# Patient Record
Sex: Male | Born: 1935
Health system: Southern US, Community
[De-identification: ages and names within clinical notes are randomized; demographics above are authoritative.]

## PROBLEM LIST (undated history)

## (undated) DIAGNOSIS — I4949 Other premature depolarization: Secondary | ICD-10-CM

## (undated) DIAGNOSIS — C44201 Unspecified malignant neoplasm of skin of unspecified ear and external auricular canal: Secondary | ICD-10-CM

## (undated) DIAGNOSIS — I5032 Chronic diastolic (congestive) heart failure: Secondary | ICD-10-CM

## (undated) DIAGNOSIS — N200 Calculus of kidney: Secondary | ICD-10-CM

## (undated) DIAGNOSIS — R350 Frequency of micturition: Secondary | ICD-10-CM

## (undated) DIAGNOSIS — K219 Gastro-esophageal reflux disease without esophagitis: Secondary | ICD-10-CM

## (undated) DIAGNOSIS — Z79899 Other long term (current) drug therapy: Secondary | ICD-10-CM

## (undated) DIAGNOSIS — R002 Palpitations: Secondary | ICD-10-CM

## (undated) DIAGNOSIS — I6522 Occlusion and stenosis of left carotid artery: Secondary | ICD-10-CM

## (undated) DIAGNOSIS — R42 Dizziness and giddiness: Secondary | ICD-10-CM

## (undated) DIAGNOSIS — Z8719 Personal history of other diseases of the digestive system: Secondary | ICD-10-CM

## (undated) DIAGNOSIS — N401 Enlarged prostate with lower urinary tract symptoms: Secondary | ICD-10-CM

## (undated) DIAGNOSIS — I219 Acute myocardial infarction, unspecified: Secondary | ICD-10-CM

## (undated) DIAGNOSIS — E669 Obesity, unspecified: Secondary | ICD-10-CM

## (undated) DIAGNOSIS — N210 Calculus in bladder: Secondary | ICD-10-CM

## (undated) DIAGNOSIS — Z125 Encounter for screening for malignant neoplasm of prostate: Secondary | ICD-10-CM

## (undated) DIAGNOSIS — R7303 Prediabetes: Secondary | ICD-10-CM

## (undated) DIAGNOSIS — D649 Anemia, unspecified: Secondary | ICD-10-CM

## (undated) DIAGNOSIS — I10 Essential (primary) hypertension: Secondary | ICD-10-CM

## (undated) DIAGNOSIS — M542 Cervicalgia: Secondary | ICD-10-CM

## (undated) DIAGNOSIS — K648 Other hemorrhoids: Secondary | ICD-10-CM

## (undated) DIAGNOSIS — I2510 Atherosclerotic heart disease of native coronary artery without angina pectoris: Secondary | ICD-10-CM

## (undated) DIAGNOSIS — K209 Esophagitis, unspecified without bleeding: Secondary | ICD-10-CM

## (undated) DIAGNOSIS — L0390 Cellulitis, unspecified: Secondary | ICD-10-CM

## (undated) DIAGNOSIS — J439 Emphysema, unspecified: Secondary | ICD-10-CM

## (undated) DIAGNOSIS — L0291 Cutaneous abscess, unspecified: Secondary | ICD-10-CM

## (undated) DIAGNOSIS — L711 Rhinophyma: Secondary | ICD-10-CM

## (undated) DIAGNOSIS — E785 Hyperlipidemia, unspecified: Secondary | ICD-10-CM

## (undated) DIAGNOSIS — R39198 Other difficulties with micturition: Secondary | ICD-10-CM

## (undated) DIAGNOSIS — R0602 Shortness of breath: Secondary | ICD-10-CM

## (undated) DIAGNOSIS — Z87442 Personal history of urinary calculi: Secondary | ICD-10-CM

## (undated) DIAGNOSIS — I255 Ischemic cardiomyopathy: Secondary | ICD-10-CM

## (undated) DIAGNOSIS — E559 Vitamin D deficiency, unspecified: Secondary | ICD-10-CM

## (undated) DIAGNOSIS — M1990 Unspecified osteoarthritis, unspecified site: Secondary | ICD-10-CM

## (undated) DIAGNOSIS — N201 Calculus of ureter: Secondary | ICD-10-CM

## (undated) DIAGNOSIS — R600 Localized edema: Secondary | ICD-10-CM

## (undated) DIAGNOSIS — Z973 Presence of spectacles and contact lenses: Secondary | ICD-10-CM

## (undated) DIAGNOSIS — Z85828 Personal history of other malignant neoplasm of skin: Secondary | ICD-10-CM

## (undated) DIAGNOSIS — G4700 Insomnia, unspecified: Secondary | ICD-10-CM

## (undated) DIAGNOSIS — I252 Old myocardial infarction: Secondary | ICD-10-CM

## (undated) DIAGNOSIS — N138 Other obstructive and reflux uropathy: Secondary | ICD-10-CM

## (undated) DIAGNOSIS — I739 Peripheral vascular disease, unspecified: Secondary | ICD-10-CM

## (undated) DIAGNOSIS — M950 Acquired deformity of nose: Secondary | ICD-10-CM

## (undated) DIAGNOSIS — C4440 Unspecified malignant neoplasm of skin of scalp and neck: Secondary | ICD-10-CM

## (undated) DIAGNOSIS — T8859XA Other complications of anesthesia, initial encounter: Secondary | ICD-10-CM

## (undated) DIAGNOSIS — I509 Heart failure, unspecified: Secondary | ICD-10-CM

## (undated) DIAGNOSIS — F419 Anxiety disorder, unspecified: Secondary | ICD-10-CM

## (undated) HISTORY — DX: Essential (primary) hypertension: I10

## (undated) HISTORY — DX: Palpitations: R00.2

## (undated) HISTORY — DX: Other premature depolarization: I49.49

## (undated) HISTORY — DX: Heart failure, unspecified: I50.9

## (undated) HISTORY — DX: Rhinophyma: L71.1

## (undated) HISTORY — DX: Dizziness and giddiness: R42

## (undated) HISTORY — DX: Obesity, unspecified: E66.9

## (undated) HISTORY — PX: CARDIAC CATHETERIZATION: SHX172

## (undated) HISTORY — PX: OTHER SURGICAL HISTORY: SHX169

## (undated) HISTORY — DX: Esophagitis, unspecified: K20.9

## (undated) HISTORY — DX: Calculus of kidney: N20.0

## (undated) HISTORY — DX: Other hemorrhoids: K64.8

## (undated) HISTORY — DX: Shortness of breath: R06.02

## (undated) HISTORY — DX: Encounter for screening for malignant neoplasm of prostate: Z12.5

## (undated) HISTORY — DX: Other obstructive and reflux uropathy: N13.8

## (undated) HISTORY — DX: Atherosclerotic heart disease of native coronary artery without angina pectoris: I25.10

## (undated) HISTORY — DX: Insomnia, unspecified: G47.00

## (undated) HISTORY — DX: Unspecified malignant neoplasm of skin of unspecified ear and external auricular canal: C44.201

## (undated) HISTORY — DX: Cutaneous abscess, unspecified: L02.91

## (undated) HISTORY — DX: Peripheral vascular disease, unspecified: I73.9

## (undated) HISTORY — DX: Hyperlipidemia, unspecified: E78.5

## (undated) HISTORY — DX: Benign prostatic hyperplasia with lower urinary tract symptoms: N40.1

## (undated) HISTORY — DX: Unspecified malignant neoplasm of skin of scalp and neck: C44.40

## (undated) HISTORY — DX: Acquired deformity of nose: M95.0

## (undated) HISTORY — DX: Cellulitis, unspecified: L03.90

## (undated) HISTORY — DX: Esophagitis, unspecified without bleeding: K20.90

## (undated) HISTORY — DX: Vitamin D deficiency, unspecified: E55.9

## (undated) HISTORY — DX: Cervicalgia: M54.2

## (undated) HISTORY — DX: Chronic diastolic (congestive) heart failure: I50.32

## (undated) HISTORY — DX: Other difficulties with micturition: R39.198

## (undated) HISTORY — PX: CORONARY ARTERY BYPASS GRAFT: SHX141

## (undated) HISTORY — DX: Frequency of micturition: R35.0

## (undated) HISTORY — DX: Other long term (current) drug therapy: Z79.899

## (undated) HISTORY — DX: Old myocardial infarction: I25.2

---
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
## 1979-05-04 HISTORY — PX: EXCISIONAL HEMORRHOIDECTOMY: SHX1541

## 1998-05-03 DIAGNOSIS — I2510 Atherosclerotic heart disease of native coronary artery without angina pectoris: Secondary | ICD-10-CM

## 1998-05-03 HISTORY — PX: OTHER SURGICAL HISTORY: SHX169

## 1998-05-03 HISTORY — PX: CORONARY ARTERY BYPASS GRAFT: SHX141

## 1998-05-03 HISTORY — PX: LEFT HEART CATH AND CORONARY ANGIOGRAPHY: CATH118249

## 1998-05-03 HISTORY — DX: Atherosclerotic heart disease of native coronary artery without angina pectoris: I25.10

## 1998-09-11 ENCOUNTER — Inpatient Hospital Stay (HOSPITAL_COMMUNITY): Admission: EM | Admit: 1998-09-11 | Discharge: 1998-09-20 | Payer: Self-pay | Admitting: Emergency Medicine

## 1998-09-11 HISTORY — PX: CORONARY ARTERY BYPASS GRAFT: SHX141

## 1998-09-12 ENCOUNTER — Encounter: Payer: Self-pay | Admitting: Cardiovascular Disease

## 1998-09-14 ENCOUNTER — Encounter: Payer: Self-pay | Admitting: Cardiovascular Disease

## 1998-09-15 ENCOUNTER — Encounter: Payer: Self-pay | Admitting: Thoracic Surgery (Cardiothoracic Vascular Surgery)

## 1998-09-16 ENCOUNTER — Encounter: Payer: Self-pay | Admitting: Thoracic Surgery (Cardiothoracic Vascular Surgery)

## 1998-09-17 ENCOUNTER — Encounter: Payer: Self-pay | Admitting: Thoracic Surgery (Cardiothoracic Vascular Surgery)

## 1998-09-18 ENCOUNTER — Encounter: Payer: Self-pay | Admitting: Thoracic Surgery (Cardiothoracic Vascular Surgery)

## 1998-10-28 ENCOUNTER — Encounter (HOSPITAL_COMMUNITY): Admission: RE | Admit: 1998-10-28 | Discharge: 1998-12-01 | Payer: Self-pay | Admitting: Cardiovascular Disease

## 1998-12-02 ENCOUNTER — Encounter (HOSPITAL_COMMUNITY): Admission: RE | Admit: 1998-12-02 | Discharge: 1999-03-02 | Payer: Self-pay | Admitting: Cardiovascular Disease

## 1999-02-09 ENCOUNTER — Encounter (HOSPITAL_COMMUNITY): Admission: RE | Admit: 1999-02-09 | Discharge: 1999-05-10 | Payer: Self-pay | Admitting: Cardiovascular Disease

## 1999-04-03 ENCOUNTER — Encounter (HOSPITAL_COMMUNITY): Admission: RE | Admit: 1999-04-03 | Discharge: 1999-07-02 | Payer: Self-pay | Admitting: Cardiovascular Disease

## 1999-07-03 ENCOUNTER — Encounter (HOSPITAL_COMMUNITY): Admission: RE | Admit: 1999-07-03 | Discharge: 1999-10-01 | Payer: Self-pay | Admitting: Cardiovascular Disease

## 1999-10-02 ENCOUNTER — Encounter (HOSPITAL_COMMUNITY): Admission: RE | Admit: 1999-10-02 | Discharge: 1999-12-31 | Payer: Self-pay | Admitting: Cardiovascular Disease

## 2000-01-01 ENCOUNTER — Encounter (HOSPITAL_COMMUNITY): Admission: RE | Admit: 2000-01-01 | Discharge: 2000-03-31 | Payer: Self-pay | Admitting: Cardiovascular Disease

## 2000-04-01 ENCOUNTER — Encounter (HOSPITAL_COMMUNITY): Admission: RE | Admit: 2000-04-01 | Discharge: 2000-06-30 | Payer: Self-pay | Admitting: Cardiovascular Disease

## 2003-05-04 HISTORY — PX: LITHOTRIPSY: SUR834

## 2003-05-04 HISTORY — PX: EXTRACORPOREAL SHOCK WAVE LITHOTRIPSY: SHX1557

## 2003-12-18 ENCOUNTER — Emergency Department (HOSPITAL_COMMUNITY): Admission: EM | Admit: 2003-12-18 | Discharge: 2003-12-19 | Payer: Self-pay

## 2003-12-26 ENCOUNTER — Ambulatory Visit (HOSPITAL_COMMUNITY): Admission: RE | Admit: 2003-12-26 | Discharge: 2003-12-26 | Payer: Self-pay | Admitting: Urology

## 2005-05-03 HISTORY — PX: COLONOSCOPY: SHX5424

## 2005-05-03 HISTORY — PX: ESOPHAGOGASTRODUODENOSCOPY ENDOSCOPY: SHX5814

## 2005-05-03 HISTORY — PX: COLONOSCOPY WITH ESOPHAGOGASTRODUODENOSCOPY (EGD): SHX5779

## 2005-05-03 LAB — HM COLONOSCOPY: HM Colonoscopy: NORMAL

## 2005-05-10 ENCOUNTER — Encounter: Payer: Self-pay | Admitting: Gastroenterology

## 2005-05-10 ENCOUNTER — Ambulatory Visit (HOSPITAL_COMMUNITY): Admission: RE | Admit: 2005-05-10 | Discharge: 2005-05-10 | Payer: Self-pay | Admitting: *Deleted

## 2009-01-31 ENCOUNTER — Encounter (INDEPENDENT_AMBULATORY_CARE_PROVIDER_SITE_OTHER): Payer: Self-pay

## 2009-12-03 ENCOUNTER — Encounter (INDEPENDENT_AMBULATORY_CARE_PROVIDER_SITE_OTHER): Payer: Self-pay | Admitting: *Deleted

## 2010-01-16 ENCOUNTER — Ambulatory Visit: Payer: Self-pay | Admitting: Gastroenterology

## 2010-06-02 NOTE — Procedures (Signed)
Summary: Colonoscopy / [facility]  Colonoscopy / [facility]   Imported By: Lennie Odor 01/21/2010 11:33:37  _____________________________________________________________________  External Attachment:    Type:   Image     Comment:   External Document

## 2010-06-02 NOTE — Procedures (Signed)
Summary: Endoscopy / [facility]  Endoscopy / [facility]   Imported By: Lennie Odor 01/21/2010 11:32:25  _____________________________________________________________________  External Attachment:    Type:   Image     Comment:   External Document

## 2010-06-02 NOTE — Assessment & Plan Note (Signed)
History of Present Illness Visit Type: consult  Primary GI MD: Rob Bunting MD Primary Provider: Kimber Relic, MD  Requesting Provider: na Chief Complaint: GERD, and rectal bleeding  History of Present Illness:     very pleasant 77 year old man who had been seeing Dr. Virginia Rochester from GI.  He had colonoscopies in the past, last one was in 2007.  He understands that colon was normal.  No polyps.  He also underwent EGD at same time and describes "raw spots" on the esophagus and was put on PPI.  He has had pyrosis, which is under good control as long as he takes PPI.  If he eats a late night meal, or something unusual he will have pyrosis, acid regurg. He will sit up in bed, drinks some water.  No dypshagia.  He has gained about 10 pounds in the past year.  No new bowel changes since colonoscopy, does sees dripping red blood from rectum.  He can feel some hemorrhoidal bumps.            Current Medications (verified): 1)  Diazepam 10 Mg Tabs (Diazepam) .... 2 1/2 Tablet By Mouth At Bedtime 2)  Amlodipine Besylate 5 Mg Tabs (Amlodipine Besylate) .... One Tablet By Mouth Once Daily 3)  Advair Diskus 250-50 Mcg/dose Aepb (Fluticasone-Salmeterol) .... As Directed 4)  Doxepin Hcl 25 Mg Caps (Doxepin Hcl) .... Two Tablets By Mouth At Bedtime 5)  Lorazepam 0.5 Mg Tabs (Lorazepam) .... One Tablet By Mouth At Bedtime 6)  Vitamin D 400 Unit  Tabs (Cholecalciferol) .... One Tablet By Mouth Once Daily 7)  Fish Oil   Oil (Fish Oil) .... Takes 2400mg  Once Daily By Mouth 8)  Magnesium 500 Mg Tabs (Magnesium) .... One Tablet By Mouth Once Daily 9)  Coq10 400 Mg Caps (Coenzyme Q10) .... One Capsule By Mouth Once Daily 10)  Potassium Gluconate 550 Mg Tabs (Potassium Gluconate) .... One Tablet By Mouth Once Daily 11)  Prilosec 40 Mg Cpdr (Omeprazole) .... One Tablet By Mouth Once Daily 12)  Pantoprazole Sodium 40 Mg Tbec (Pantoprazole Sodium) .... One Tablet By Mouth Once Daily--Will Take When No  Prilosec  Allergies (verified): No Known Drug Allergies  Past History:  Past Medical History: arthritis Coronary artery disease,  Elevated cholesterol Hypertension Kidney stones  Past Surgical History: coronary artery bypass in 2000  Family History: no colon cancer daughter with melanoma  Social History: he is widowed, he has 4 children, he is retired but still does some work in a [employer], he quit smoking, he drinks one alcoholic beverage per day, he drinks 2 caffeinated beverages per day  Review of Systems       Pertinent positive and negative review of systems were noted in the above HPI and GI specific review of systems.  All other review of systems was otherwise negative.   Vital Signs:  Patient profile:   77 year old male Height:      71 inches Weight:      217 pounds BMI:     30.37 BSA:     2.19 Pulse rate:   88 / minute Pulse rhythm:   regular BP sitting:   136 / 80  (left arm) Cuff size:   regular  Vitals Entered By: Ok Anis CMA (January 16, 2010 10:18 AM) CC: GERD, and rectal bleeding    Physical Exam  Additional Exam:  Constitutional: generally well appearing Psychiatric: alert and oriented times 3 Eyes: extraocular movements intact Mouth: oropharynx moist, no  lesions Neck: supple, no lymphadenopathy Cardiovascular: heart regular rate and rythm Lungs: CTA bilaterally Abdomen: soft, non-tender, non-distended, no obvious ascites, no peritoneal signs, normal bowel sounds Extremities: no lower extremity edema bilaterally Skin: no lesions on visible extremities Rectal examination reveals external hemorrhoids, soft, nonthrombosed, no rectal masses, stool was brown and Hemoccult negative   Impression & Recommendations:  Problem # 1:  rectal bleeding, GERD the rectal bleeding is fairly rare and almost certainly from his external hemorrhoids. I advised him to use over-the-counter ointments as needed. His GERD symptoms are under good control as  long as he takes proton pump inhibitor, I did recommend he try H2 blocker at night HEENT late meal. We will get records from Dr. Wende Neighbors colonoscopy, upper endoscopy from 2007 to see if he indeed needs any repeat examinations are round now. I suspect that he will not.  Patient Instructions: 1)  Use preparation H as needed for hemorrhoidal bleeding, discomfort. 2)  Will get records from Dr. Wende Neighbors EGD, colonoscopy to decide on necessity of repeat procedures. 3)  Continue on once daily protonix. 4)  GERD handout given. 5)  OTC pepcid or zantac can be very helpful at bedtime if you eat a late meal. 6)  A copy of this information will be sent to Dr. Frederik Pear. 7)  The medication list was reviewed and reconciled.  All changed / newly prescribed medications were explained.  A complete medication list was provided to the patient / caregiver.  Appended Document:  recieved records from 2007 colo/EGD:  EGD suggested gastritris, ?barrett's (biopsies only GERD, no intestinal metaplastia.  Colonoscopy to cecum described diveritculosis, but no polyps or cancers.   please call him.  he does not need repeat EGD or colonoscopy at this point.  Appended Document:  pt aware

## 2010-06-02 NOTE — Letter (Signed)
Summary: New Patient letter  [facility]  [address] [place], [place] [address]   Phone: [phone]  Fax: [phone]       12/03/2009 MRN: [id number]  [facility] [address] [place], [place]  [address]  Dear [name],  Welcome to the Gastroenterology Division at [facility].    You are scheduled to see Dr.  Christella Hartigan on 01-16-10 at 10:30a.m. on the 3rd floor at [facility], [address].  We ask that you try to arrive at our office 15 minutes prior to your appointment time to allow for check-in.  We would like you to complete the enclosed self-administered evaluation form prior to your visit and bring it with you on the day of your appointment.  We will review it with you.  Also, please bring a complete list of all your medications or, if you prefer, bring the medication bottles and we will list them.  Please bring your insurance card so that we may make a copy of it.  If your insurance requires a referral to see a specialist, please bring your referral form from your primary care physician.  Co-payments are due at the time of your visit and may be paid by cash, check or credit card.     Your office visit will consist of a consult with your physician (includes a physical exam), any laboratory testing he/she may order, scheduling of any necessary diagnostic testing (e.g. x-ray, ultrasound, CT-scan), and scheduling of a procedure (e.g. Endoscopy, Colonoscopy) if required.  Please allow enough time on your schedule to allow for any/all of these possibilities.    If you cannot keep your appointment, please call [phone] to cancel or reschedule prior to your appointment date.  This allows [place] the opportunity to schedule an appointment for another patient in need of care.  If you do not cancel or reschedule by 5 p.m. the business day prior to your appointment date, you will be charged a $50.00 late cancellation/no-show fee.    Thank you for choosing  [facility] for your medical needs.  We appreciate the opportunity to care for you.  Please visit [place] at our website  to learn more about our practice.                     Sincerely,                                                             The Gastroenterology Division

## 2010-06-19 ENCOUNTER — Other Ambulatory Visit: Payer: Self-pay | Admitting: Neurosurgery

## 2010-06-19 DIAGNOSIS — M542 Cervicalgia: Secondary | ICD-10-CM

## 2010-07-06 ENCOUNTER — Ambulatory Visit
Admission: RE | Admit: 2010-07-06 | Discharge: 2010-07-06 | Disposition: A | Discharge status: Home / Self Care | Payer: Medicare Other | Source: Ambulatory Visit | Attending: Neurosurgery | Admitting: Neurosurgery

## 2010-07-06 DIAGNOSIS — M542 Cervicalgia: Secondary | ICD-10-CM

## 2010-10-16 ENCOUNTER — Other Ambulatory Visit: Payer: Self-pay | Admitting: Dermatology

## 2012-08-20 ENCOUNTER — Other Ambulatory Visit: Payer: Self-pay | Admitting: Internal Medicine

## 2012-09-20 ENCOUNTER — Other Ambulatory Visit: Payer: Self-pay | Admitting: *Deleted

## 2012-09-20 MED ORDER — DOXEPIN HCL 25 MG PO CAPS: ORAL_CAPSULE | ORAL | Status: DC

## 2012-10-06 ENCOUNTER — Other Ambulatory Visit: Payer: PRIVATE HEALTH INSURANCE

## 2012-10-06 ENCOUNTER — Encounter: Payer: Self-pay | Admitting: *Deleted

## 2012-10-06 ENCOUNTER — Other Ambulatory Visit: Payer: Self-pay | Admitting: *Deleted

## 2012-10-06 DIAGNOSIS — Z79899 Other long term (current) drug therapy: Secondary | ICD-10-CM

## 2012-10-06 DIAGNOSIS — I10 Essential (primary) hypertension: Secondary | ICD-10-CM

## 2012-10-06 DIAGNOSIS — E785 Hyperlipidemia, unspecified: Secondary | ICD-10-CM

## 2012-10-07 LAB — LIPID PANEL
Chol/HDL Ratio: 3.7 ratio units (ref 0.0–5.0)
Cholesterol, Total: 175 mg/dL (ref 100–199)
Triglycerides: 81 mg/dL (ref 0–149)
VLDL Cholesterol Cal: 16 mg/dL (ref 5–40)

## 2012-10-07 LAB — BASIC METABOLIC PANEL
BUN: 12 mg/dL (ref 8–27)
Calcium: 8.8 mg/dL (ref 8.6–10.2)
Chloride: 104 mmol/L (ref 97–108)
GFR calc non Af Amer: 74 mL/min/{1.73_m2} (ref >59)
Glucose: 93 mg/dL (ref 65–99)
Potassium: 4 mmol/L (ref 3.5–5.2)

## 2012-10-07 LAB — TSH: TSH: 1.39 u[IU]/mL (ref 0.450–4.500)

## 2012-10-09 ENCOUNTER — Encounter: Payer: Self-pay | Admitting: Geriatric Medicine

## 2012-10-10 ENCOUNTER — Encounter: Payer: Self-pay | Admitting: Internal Medicine

## 2012-10-10 ENCOUNTER — Ambulatory Visit (INDEPENDENT_AMBULATORY_CARE_PROVIDER_SITE_OTHER): Payer: 59 | Admitting: Internal Medicine

## 2012-10-10 VITALS — BP 124/62 | HR 80 | Temp 97.5°F | Resp 18 | Ht 69.5 in | Wt 198.6 lb

## 2012-10-10 DIAGNOSIS — E785 Hyperlipidemia, unspecified: Secondary | ICD-10-CM | POA: Insufficient documentation

## 2012-10-10 DIAGNOSIS — E559 Vitamin D deficiency, unspecified: Secondary | ICD-10-CM | POA: Insufficient documentation

## 2012-10-10 DIAGNOSIS — I2510 Atherosclerotic heart disease of native coronary artery without angina pectoris: Secondary | ICD-10-CM

## 2012-10-10 DIAGNOSIS — I10 Essential (primary) hypertension: Secondary | ICD-10-CM | POA: Insufficient documentation

## 2012-10-10 DIAGNOSIS — N138 Other obstructive and reflux uropathy: Secondary | ICD-10-CM | POA: Insufficient documentation

## 2012-10-10 DIAGNOSIS — G4700 Insomnia, unspecified: Secondary | ICD-10-CM

## 2012-10-10 DIAGNOSIS — N401 Enlarged prostate with lower urinary tract symptoms: Secondary | ICD-10-CM | POA: Insufficient documentation

## 2012-10-10 DIAGNOSIS — R42 Dizziness and giddiness: Secondary | ICD-10-CM | POA: Insufficient documentation

## 2012-10-10 DIAGNOSIS — E669 Obesity, unspecified: Secondary | ICD-10-CM | POA: Insufficient documentation

## 2012-10-10 NOTE — Progress Notes (Signed)
Subjective:    Patient ID: Alex [name], male    DOB: December 27, 1931, 79 y.o.   MRN: [id number]  HPI Other and unspecified hyperlipidemia  controlled Unspecified essential hypertension  controlled Coronary atherosclerosis of unspecified type of vessel, native or graft  No angina Dizziness and giddiness  improved Obesity, unspecified  uunchanged Insomnia, unspecified  unchanged Hypertrophy of prostate with urinary obstruction and other lower urinary tract symptoms (LUTS)  Unchanged. Denies frequency or nocturia. Unspecified vitamin D deficiency  Using supplement    Current Outpatient Prescriptions on File Prior to Visit  Medication Sig Dispense Refill  . amLODipine (NORVASC) 5 MG tablet take 1 tablet by mouth once daily to CONTROL blood pressure  30 tablet  4  . aspirin 81 MG tablet Take 81 mg by mouth daily. Take one tablet once a day      . cholecalciferol (VITAMIN D) 1000 UNITS tablet Take 1,000 Units by mouth daily. Take two tablets daily      . diazepam (VALIUM) 5 MG tablet Take 5 mg by mouth every 6 (six) hours as needed for anxiety. Take 1/2 tablet at night for dizziness as needed      . doxepin (SINEQUAN) 25 MG capsule Take 3 capsules once a day at bedtime for depression  90 capsule  5  . fish oil-omega-3 fatty acids 1000 MG capsule Take 2 g by mouth daily. Take one tablet once a day for cholesterol      . ibuprofen (ADVIL,MOTRIN) 200 MG tablet Take 200 mg by mouth every 6 (six) hours as needed for pain. Take 3-4 tablets twice a day for neck pain as needed      . LORazepam (ATIVAN) 1 MG tablet Take 1 mg by mouth every 8 (eight) hours. Take one tablet at bedtime as needed for rest      . pantoprazole (PROTONIX) 40 MG tablet Take 40 mg by mouth daily. Take one tablet once a day for acid reflux      . Potassium 99 MG TABS Take by mouth. Take one tablet once a day for cramps      . pseudoephedrine-guaifenesin (MUCINEX D) 60-600 MG per tablet Take 1 tablet by mouth every 12 (twelve)  hours. Take 1 tablet once a day as needed for cough      . Red Yeast Rice Extract 600 MG TABS Take by mouth. Take 2 tablets once a day for cholesterol      . RESVERATROL 100 MG CAPS Take by mouth. Take one tablet once a day      . Fluticasone-Salmeterol (ADVAIR) 250-50 MCG/DOSE AEPB Inhale 1 puff into the lungs every 12 (twelve) hours. Inhale twice a day       No current facility-administered medications on file prior to visit.     Review of Systems  Constitutional: Negative for fever, activity change, appetite change, fatigue and unexpected weight change.  HENT: Positive for hearing loss. Negative for neck pain, neck stiffness and ear discharge.   Eyes: Negative.   Respiratory: Negative.   Cardiovascular: Negative.   Gastrointestinal:       Occasional heartburn which seems well controlled. History of hemorrhoids.  Endocrine: Negative.   Genitourinary: Negative.        History of kidney stones  Musculoskeletal: Positive for back pain and arthralgias.  Skin: Negative.   Neurological:       Episodes of dizziness. None recent. Had an episode of syncope after severe coughing.  Hematological: Negative.   Psychiatric/Behavioral: Negative.  Mild insomnia.       Objective: Filed Vitals:   10/10/12 1327  BP: 124/62  Pulse: 80  Temp: 97.5 F (36.4 C)  Resp: 18  Height: 5' 9.5" (1.765 m)  Weight: 198 lb 9.6 oz (90.084 kg)        Physical Exam  Constitutional: He is oriented to person, place, and time.  Overweight.  HENT:  Mild loss of hearing bilaterally.  Eyes:  Corrective lenses.  Abdominal: Soft. Bowel sounds are normal. He exhibits no distension and no mass. There is no tenderness.  Musculoskeletal: Normal range of motion. He exhibits no edema and no tenderness.  Neurological: He is alert and oriented to person, place, and time. He has normal reflexes. No cranial nerve deficit. Coordination normal.  Skin: Skin is warm and dry. No rash noted. No erythema. No  pallor.  Psychiatric: He has a normal mood and affect. His behavior is normal. Judgment and thought content normal.      Appointment on 10/06/2012  Component Date Value Range Status  . Glucose 10/06/2012 93  65 - 99 mg/dL Final  . BUN 95/62/1308 12  8 - 27 mg/dL Final  . Creatinine, Ser 10/06/2012 0.97  0.76 - 1.27 mg/dL Final  . GFR calc non Af Amer 10/06/2012 74  >59 mL/min/1.73 Final  . GFR calc Af Amer 10/06/2012 85  >59 mL/min/1.73 Final  . BUN/Creatinine Ratio 10/06/2012 12  10 - 22 Final  . Sodium 10/06/2012 141  134 - 144 mmol/L Final  . Potassium 10/06/2012 4.0  3.5 - 5.2 mmol/L Final  . Chloride 10/06/2012 104  97 - 108 mmol/L Final  . CO2 10/06/2012 23  19 - 28 mmol/L Final   Comment: **Effective October 16, 2012, the reference interval for**                            Carbon Dioxide, Total will be changing to:                                                    0 - 30 days        15 - 27                                                 31 d - 5 months       15 - 26                                                  6 m - 11 months      15 - 25                                                    1 - 12 years       17 - 27                                                      >  12 years       18 - 29  . Calcium 10/06/2012 8.8  8.6 - 10.2 mg/dL Final  . Cholesterol, Total 10/06/2012 175  100 - 199 mg/dL Final  . Triglycerides 10/06/2012 81  0 - 149 mg/dL Final  . HDL 21/30/8657 47  >39 mg/dL Final   Comment: According to ATP-III Guidelines, HDL-C >59 mg/dL is considered a                          negative risk factor for CHD.  Marland Kitchen VLDL Cholesterol Cal 10/06/2012 16  5 - 40 mg/dL Final  . LDL Calculated 10/06/2012 846* 0 - 99 mg/dL Final  . Chol/HDL Ratio 10/06/2012 3.7  0.0 - 5.0 ratio units Final  . TSH 10/06/2012 1.390  0.450 - 4.500 uIU/mL Final       Assessment & Plan:  Other and unspecified hyperlipidemia: Controlled  Unspecified essential hypertension:  Controlled  Coronary atherosclerosis of unspecified type of vessel, native or graft: No angina or palpitations  Dizziness and giddiness: Chronic condition without recent worsening  Obesity, unspecified: Encourage weight loss  Insomnia, unspecified: Stable  Hypertrophy of prostate with urinary obstruction and other lower urinary tract symptoms (LUTS): Stable  Unspecified vitamin D deficiency: Using supplements

## 2012-10-10 NOTE — Patient Instructions (Addendum)
Continue current medications. 

## 2012-10-16 ENCOUNTER — Other Ambulatory Visit: Payer: Self-pay | Admitting: Internal Medicine

## 2012-10-19 ENCOUNTER — Other Ambulatory Visit: Payer: Self-pay | Admitting: Geriatric Medicine

## 2012-10-19 MED ORDER — LORAZEPAM 1 MG PO TABS: ORAL_TABLET | ORAL | Status: DC

## 2012-11-09 ENCOUNTER — Ambulatory Visit (INDEPENDENT_AMBULATORY_CARE_PROVIDER_SITE_OTHER): Payer: Medicare Other | Admitting: Cardiovascular Disease

## 2012-11-09 ENCOUNTER — Encounter: Payer: Self-pay | Admitting: Cardiovascular Disease

## 2012-11-09 VITALS — BP 144/86 | HR 91 | Ht 69.0 in | Wt 203.1 lb

## 2012-11-09 DIAGNOSIS — R0989 Other specified symptoms and signs involving the circulatory and respiratory systems: Secondary | ICD-10-CM

## 2012-11-09 DIAGNOSIS — E785 Hyperlipidemia, unspecified: Secondary | ICD-10-CM

## 2012-11-09 DIAGNOSIS — I10 Essential (primary) hypertension: Secondary | ICD-10-CM

## 2012-11-09 DIAGNOSIS — I779 Disorder of arteries and arterioles, unspecified: Secondary | ICD-10-CM

## 2012-11-09 DIAGNOSIS — I2510 Atherosclerotic heart disease of native coronary artery without angina pectoris: Secondary | ICD-10-CM

## 2012-11-09 NOTE — Assessment & Plan Note (Signed)
Patient had carotid Dopplers performed at [facility] medical which showed moderate left ICA stenosis. He is neurologically asymptomatic. Based on the velocities I don't think he requires revascularization at this time. I'm going to repeat this as a baseline your office and continued surveillance Dopplers on him every 6 months. He may ultimately require surgical versus percutaneous revascularization.

## 2012-11-09 NOTE — Patient Instructions (Signed)
Your physician has requested that you have a carotid duplex. This test is an ultrasound of the carotid arteries in your neck. It looks at blood flow through these arteries that supply the brain with blood. Allow one hour for this exam. There are no restrictions or special instructions.  Your physician recommends that you schedule a follow-up appointment in: 1 year

## 2012-11-09 NOTE — Progress Notes (Signed)
11/09/2012 Alex [name]   1931/11/02  [id number]  Primary Physician GREEN, Alex Curt, MD Primary Cardiologist: Alex Gess MD Alex [name]  HPI:  [name] is a delightful 79 year old widowed Caucasian male father of 4, grandfather to 5 grandchildren he was referred by [name] for evaluation of carotid artery disease.he has a history of coronary artery bypass grafting x4 in 2000 performed by Dr. Edwina [name]. His other problems include remote tobacco abuse having quit in 1975, treated hypertension and hyperlipidemia. He's not had a stroke or a myocardial infarction. He had carotid Dopplers performed at [facility] clinic on 10/23/2012 that showed moderate left ICA stenosis or diastolic velocity of 74 cm/s.   Current Outpatient Prescriptions  Medication Sig Dispense Refill  . amLODipine (NORVASC) 5 MG tablet take 1 tablet by mouth once daily to CONTROL blood pressure  30 tablet  4  . aspirin 81 MG tablet Take 81 mg by mouth daily. Take one tablet once a day      . b complex vitamins tablet Take 1 tablet by mouth daily.      . cholecalciferol (VITAMIN D) 1000 UNITS tablet Take 1,000 Units by mouth daily. Take two tablets daily      . Cinnamon 500 MG TABS Take 1 tablet by mouth daily.      . Coconut Oil 1000 MG CAPS Take 1 capsule by mouth daily.      . Coenzyme Q10 (CO Q 10) 100 MG CAPS Take 1 capsule by mouth daily.      . diazepam (VALIUM) 5 MG tablet Take 5 mg by mouth every 6 (six) hours as needed for anxiety. Take 1/2 tablet at night for dizziness as needed      . doxepin (SINEQUAN) 25 MG capsule Take 3 capsules once a day at bedtime for depression  90 capsule  5  . fish oil-omega-3 fatty acids 1000 MG capsule Take 2 g by mouth daily. Take one tablet once a day for cholesterol      . Fluticasone-Salmeterol (ADVAIR) 250-50 MCG/DOSE AEPB Inhale 1 puff into the lungs every 12 (twelve) hours. Inhale twice a day      . Garcinia Cambogia-Chromium 500-200  MG-MCG TABS Take 1 tablet by mouth daily.      Marland Kitchen ibuprofen (ADVIL,MOTRIN) 200 MG tablet Take 200 mg by mouth every 6 (six) hours as needed for pain. Take 3-4 tablets twice a day for neck pain as needed      . LORazepam (ATIVAN) 1 MG tablet One tablet by mouth nightly if needed for rest  30 tablet  0  . Menaquinone-7 (VITAMIN K2 PO) Take 50 mcg by mouth daily.      . Nutritional Supplements (NUTRITIONAL SUPPLEMENT PO) Synerflex. Take 2 tablets by mouth daily.      . Nutritional Supplements (NUTRITIONAL SUPPLEMENT PO) Ginger and Turmeric. Take 1 capsule by mouth daily.      . Potassium 99 MG TABS Take by mouth. Take one tablet once a day for cramps      . Red Yeast Rice Extract 600 MG TABS Take by mouth. Take 2 tablets once a day for cholesterol      . RESVERATROL 100 MG CAPS Take by mouth. Take one tablet once a day      . ZOSTAVAX 40981 UNT/0.65ML injection        No current facility-administered medications for this visit.    No Known Allergies  History   Social History  .  Marital Status: Widowed    Spouse Name: N/A    Number of Children: N/A  . Years of Education: N/A   Occupational History  . Not on file.   Social History Main Topics  . Smoking status: Former Smoker -- 1.50 packs/day for 22 years    Types: Cigarettes    Quit date: 05/03/1973  . Smokeless tobacco: Never Used  . Alcohol Use: Yes  . Drug Use: No  . Sexually Active: No   Other Topics Concern  . Not on file   Social History Narrative  . No narrative on file     Review of Systems: General: negative for chills, fever, night sweats or weight changes.  Cardiovascular: negative for chest pain, dyspnea on exertion, edema, orthopnea, palpitations, paroxysmal nocturnal dyspnea or shortness of breath Dermatological: negative for rash Respiratory: negative for cough or wheezing Urologic: negative for hematuria Abdominal: negative for nausea, vomiting, diarrhea, bright red blood per rectum, melena, or  hematemesis Neurologic: negative for visual changes, syncope, or dizziness All other systems reviewed and are otherwise negative except as noted above.    Blood pressure 144/86, pulse 91, height 5\' 9"  (1.753 m), weight 203 lb 1.6 oz (92.126 kg).  General appearance: alert and no distress Neck: no adenopathy, no carotid bruit, no JVD, supple, symmetrical, trachea midline and thyroid not enlarged, symmetric, no tenderness/mass/nodules Lungs: clear to auscultation bilaterally Heart: regular rate and rhythm, S1, S2 normal, no murmur, click, rub or gallop Abdomen: soft, non-tender; bowel sounds normal; no masses,  no organomegaly Extremities: extremities normal, atraumatic, no cyanosis or edema Pulses: 2+ and symmetric  EKG normal sinus rhythm at 87 without ST or T wave changes  ASSESSMENT AND PLAN:   Carotid artery disease Patient had carotid Dopplers performed at [facility] medical which showed moderate left ICA stenosis. He is neurologically asymptomatic. Based on the velocities I don't think he requires revascularization at this time. I'm going to repeat this as a baseline your office and continued surveillance Dopplers on him every 6 months. He may ultimately require surgical versus percutaneous revascularization.      Alex Gess MD FACP,FACC,FAHA, FSCAI 11/09/2012 4:00 PM

## 2012-11-13 ENCOUNTER — Encounter: Payer: Self-pay | Admitting: Cardiovascular Disease

## 2012-11-16 ENCOUNTER — Ambulatory Visit (HOSPITAL_COMMUNITY)
Admission: RE | Admit: 2012-11-16 | Discharge: 2012-11-16 | Disposition: A | Discharge status: Home / Self Care | Payer: Medicare Other | Source: Ambulatory Visit | Attending: Cardiovascular Disease | Admitting: Cardiovascular Disease

## 2012-11-16 DIAGNOSIS — R0989 Other specified symptoms and signs involving the circulatory and respiratory systems: Secondary | ICD-10-CM | POA: Insufficient documentation

## 2012-11-16 NOTE — Progress Notes (Signed)
Carotid Duplex Completed. Shalissa Easterwood, RDMS, RVT  

## 2012-11-17 ENCOUNTER — Other Ambulatory Visit: Payer: Self-pay | Admitting: Geriatric Medicine

## 2012-11-17 MED ORDER — DIAZEPAM 5 MG PO TABS: ORAL_TABLET | ORAL | Status: DC

## 2012-11-27 ENCOUNTER — Telehealth: Payer: Self-pay | Admitting: *Deleted

## 2012-11-27 DIAGNOSIS — I6529 Occlusion and stenosis of unspecified carotid artery: Secondary | ICD-10-CM

## 2012-11-27 NOTE — Telephone Encounter (Signed)
Message copied by Marella Bile on Mon Nov 27, 2012  4:49 PM ------      Message from: Runell Gess      Created: Wed Nov 22, 2012  6:29 PM       Mod abn study. Repeat in 6 months ------

## 2013-02-21 ENCOUNTER — Other Ambulatory Visit: Payer: Self-pay | Admitting: Internal Medicine

## 2013-03-12 ENCOUNTER — Other Ambulatory Visit: Payer: Self-pay | Admitting: *Deleted

## 2013-03-12 MED ORDER — LORAZEPAM 1 MG PO TABS: ORAL_TABLET | ORAL | Status: DC

## 2013-04-06 ENCOUNTER — Encounter: Payer: Self-pay | Admitting: *Deleted

## 2013-04-06 ENCOUNTER — Other Ambulatory Visit: Payer: 59

## 2013-04-06 DIAGNOSIS — E785 Hyperlipidemia, unspecified: Secondary | ICD-10-CM

## 2013-04-06 DIAGNOSIS — I10 Essential (primary) hypertension: Secondary | ICD-10-CM

## 2013-04-07 LAB — BASIC METABOLIC PANEL
Calcium: 9.2 mg/dL (ref 8.6–10.2)
Chloride: 101 mmol/L (ref 97–108)
Glucose: 99 mg/dL (ref 65–99)
Potassium: 3.9 mmol/L (ref 3.5–5.2)
Sodium: 141 mmol/L (ref 134–144)

## 2013-04-07 LAB — LIPID PANEL: Cholesterol, Total: 225 mg/dL — ABNORMAL HIGH (ref 100–199)

## 2013-04-10 ENCOUNTER — Encounter: Payer: Self-pay | Admitting: Internal Medicine

## 2013-04-10 ENCOUNTER — Ambulatory Visit (INDEPENDENT_AMBULATORY_CARE_PROVIDER_SITE_OTHER): Payer: 59 | Admitting: Internal Medicine

## 2013-04-10 VITALS — BP 170/86 | HR 80 | Temp 97.3°F | Resp 14 | Wt 206.2 lb

## 2013-04-10 DIAGNOSIS — I10 Essential (primary) hypertension: Secondary | ICD-10-CM

## 2013-04-10 DIAGNOSIS — E785 Hyperlipidemia, unspecified: Secondary | ICD-10-CM

## 2013-04-10 DIAGNOSIS — E669 Obesity, unspecified: Secondary | ICD-10-CM

## 2013-04-10 DIAGNOSIS — I779 Disorder of arteries and arterioles, unspecified: Secondary | ICD-10-CM

## 2013-04-10 DIAGNOSIS — G4700 Insomnia, unspecified: Secondary | ICD-10-CM

## 2013-04-10 DIAGNOSIS — R42 Dizziness and giddiness: Secondary | ICD-10-CM

## 2013-04-10 MED ORDER — AMLODIPINE BESYLATE 10 MG PO TABS: ORAL_TABLET | ORAL | Status: DC

## 2013-04-10 NOTE — Patient Instructions (Signed)
Lose weight. Resume red yeast rice at previous dosage. Stop coconut oil.

## 2013-04-10 NOTE — Progress Notes (Signed)
Subjective:    Patient ID: Alex [name], male    DOB: Sep 08, 1931, 79 y.o.   MRN: [id number]  Chief Complaint  Patient presents with  . Medical Managment of Chronic Issues    6 month f/u with labs printed    HPI Other and unspecified hyperlipidemia: rising LDL. He is not on as much red yeast rice, has gained weight, and started using coconut oil  Unspecified essential hypertension: SBP elevated today  Obesity, unspecified: gained weight  Carotid artery disease: he is disturbed about this  Dizziness and giddiness:  Improved on diazepam. No episodes in 2 years.  Insomnia, unspecified: takes 2 doxepin, lorazepam 1mg , and diazepam 1/2 tablet.  Now taking apple cider vinegar and his reflux has resolved in the last 6 months.   Current Outpatient Prescriptions on File Prior to Visit  Medication Sig Dispense Refill  . amLODipine (NORVASC) 5 MG tablet take 1 tablet by mouth once daily TO CONTROL BLOOD PRESSURE  30 tablet  4  . b complex vitamins tablet Take 1 tablet by mouth daily.      . cholecalciferol (VITAMIN D) 1000 UNITS tablet Take 1,000 Units by mouth daily. Take two tablets daily      . Cinnamon 500 MG TABS Take 1 tablet by mouth daily.      . Coconut Oil 1000 MG CAPS Take 1 capsule by mouth daily.      . Coenzyme Q10 (CO Q 10) 100 MG CAPS Take 1 capsule by mouth daily.      . diazepam (VALIUM) 5 MG tablet Take 1/2 tablet at night for dizziness as needed  30 tablet  3  . doxepin (SINEQUAN) 25 MG capsule Take 3 capsules once a day at bedtime for depression  90 capsule  5  . fish oil-omega-3 fatty acids 1000 MG capsule Take 2 g by mouth daily. Take one tablet once a day for cholesterol      . Fluticasone-Salmeterol (ADVAIR) 250-50 MCG/DOSE AEPB Inhale 1 puff into the lungs every 12 (twelve) hours. Inhale twice a day      . Garcinia Cambogia-Chromium 500-200 MG-MCG TABS Take 1 tablet by mouth daily.      Marland Kitchen ibuprofen (ADVIL,MOTRIN) 200 MG tablet Take 200 mg by mouth every 6 (six)  hours as needed for pain. Take 3-4 tablets twice a day for neck pain as needed      . LORazepam (ATIVAN) 1 MG tablet One tablet by mouth nightly if needed for rest  30 tablet  0  . Menaquinone-7 (VITAMIN K2 PO) Take 50 mcg by mouth daily.      . Nutritional Supplements (NUTRITIONAL SUPPLEMENT PO) Synerflex. Take 2 tablets by mouth daily.      . Nutritional Supplements (NUTRITIONAL SUPPLEMENT PO) Ginger and Turmeric. Take 1 capsule by mouth daily.      . Potassium 99 MG TABS Take by mouth. Take one tablet once a day for cramps      . Red Yeast Rice Extract 600 MG TABS Take by mouth. Take 2 tablets once a day for cholesterol      . RESVERATROL 100 MG CAPS Take by mouth. Take one tablet once a day      . aspirin 81 MG tablet Take 81 mg by mouth daily. Take one tablet once a day       No current facility-administered medications on file prior to visit.    Review of Systems  Constitutional: Negative for fever, activity change, appetite change, fatigue and  unexpected weight change.  HENT: Positive for hearing loss. Negative for ear discharge.   Eyes: Negative.   Respiratory: Negative.   Cardiovascular: Negative.   Gastrointestinal:       Occasional heartburn which seems well controlled. History of hemorrhoids.  Endocrine: Negative.   Genitourinary: Negative.        History of kidney stones  Musculoskeletal: Positive for arthralgias and back pain. Negative for neck pain and neck stiffness.  Skin: Negative.   Neurological:       Episodes of dizziness. None recent. Had an episode of syncope after severe coughing.  Hematological: Negative.   Psychiatric/Behavioral: Negative.        Mild insomnia.       Objective:BP 170/86  Pulse 80  Temp(Src) 97.3 F (36.3 C) (Oral)  Resp 14  Wt 206 lb 3.2 oz (93.532 kg)  SpO2 97%    Physical Exam  Constitutional: He is oriented to person, place, and time.  Overweight.  HENT:  Mild loss of hearing bilaterally.  Eyes:  Corrective lenses.  Neck:  No JVD present. No tracheal deviation present. No thyromegaly present.  Cardiovascular: Normal rate, regular rhythm, normal heart sounds and intact distal pulses.  Exam reveals no gallop and no friction rub.   No murmur heard. Pulmonary/Chest: Breath sounds normal. No respiratory distress. He has no wheezes. He has no rales. He exhibits no tenderness.  Abdominal: Soft. Bowel sounds are normal. He exhibits no distension and no mass. There is no tenderness.  Musculoskeletal: Normal range of motion. He exhibits no edema and no tenderness.  Lymphadenopathy:    He has no cervical adenopathy.  Neurological: He is alert and oriented to person, place, and time. He has normal reflexes. No cranial nerve deficit. Coordination normal.  Skin: Skin is warm and dry. No rash noted. No erythema. No pallor.  Psychiatric: He has a normal mood and affect. His behavior is normal. Judgment and thought content normal.   LAB REVIEW  Appointment on 10/06/2012   Component  Date  Value  Range  Status   .  Glucose  10/06/2012  93  65 - 99 mg/dL  Final   .  BUN  16/02/9603  12  8 - 27 mg/dL  Final   .  Creatinine, Ser  10/06/2012  0.97  0.76 - 1.27 mg/dL  Final   .  GFR calc non Af Amer  10/06/2012  74  >59 mL/min/1.73  Final   .  GFR calc Af Amer  10/06/2012  85  >59 mL/min/1.73  Final   .  BUN/Creatinine Ratio  10/06/2012  12  10 - 22  Final   .  Sodium  10/06/2012  141  134 - 144 mmol/L  Final   .  Potassium  10/06/2012  4.0  3.5 - 5.2 mmol/L  Final   .  Chloride  10/06/2012  104  97 - 108 mmol/L  Final   .  CO2  10/06/2012  23  19 - 28 mmol/L  Final    Comment: **Effective October 16, 2012, the reference interval for**    Carbon Dioxide, Total will be changing to:    0 - 30 days 15 - 27    31 d - 5 months 15 - 26    6 m - 11 months 15 - 25    1 - 12 years 17 - 27    > 12 years 18 - 29   .  Calcium  10/06/2012  8.8  8.6 -  10.2 mg/dL  Final   .  Cholesterol, Total  10/06/2012  175  100 - 199 mg/dL  Final   .   Triglycerides  10/06/2012  81  0 - 149 mg/dL  Final   .  HDL  16/02/9603  47  >39 mg/dL  Final    Comment: According to ATP-III Guidelines, HDL-C >59 mg/dL is considered a    negative risk factor for CHD.   Marland Kitchen  VLDL Cholesterol Cal  10/06/2012  16  5 - 40 mg/dL  Final   .  LDL Calculated  10/06/2012  112*  0 - 99 mg/dL  Final   .  Chol/HDL Ratio  10/06/2012  3.7  0.0 - 5.0 ratio units  Final   .  TSH  10/06/2012  1.390  0.450 - 4.500 uIU/mL  Final      Abstract on 04/06/2013  Component Date Value Range Status  . [facility] Colonoscopy 05/03/2005 Dr Virginia Rochester, normal   Final  Appointment on 04/06/2013  Component Date Value Range Status  . Glucose 04/06/2013 99  65 - 99 mg/dL Final  . BUN 54/01/8118 11  8 - 27 mg/dL Final  . Creatinine, Ser 04/06/2013 1.04  0.76 - 1.27 mg/dL Final  . GFR calc non Af Amer 04/06/2013 68  >59 mL/min/1.73 Final  . GFR calc Af Amer 04/06/2013 79  >59 mL/min/1.73 Final  . BUN/Creatinine Ratio 04/06/2013 11  10 - 22 Final  . Sodium 04/06/2013 141  134 - 144 mmol/L Final  . Potassium 04/06/2013 3.9  3.5 - 5.2 mmol/L Final  . Chloride 04/06/2013 101  97 - 108 mmol/L Final  . CO2 04/06/2013 25  18 - 29 mmol/L Final  . Calcium 04/06/2013 9.2  8.6 - 10.2 mg/dL Final  . Cholesterol, Total 04/06/2013 225* 100 - 199 mg/dL Final  . Triglycerides 04/06/2013 101  0 - 149 mg/dL Final  . HDL 14/78/2956 44  >39 mg/dL Final   Comment: According to ATP-III Guidelines, HDL-C >59 mg/dL is considered a                          negative risk factor for CHD.  Marland Kitchen VLDL Cholesterol Cal 04/06/2013 20  5 - 40 mg/dL Final  . LDL Calculated 04/06/2013 213* 0 - 99 mg/dL Final  . Chol/HDL Ratio 04/06/2013 5.1* 0.0 - 5.0 ratio units Final   Comment:                                   T. Chol/HDL Ratio                                                                      Men  Women                                                        1/2 Avg.Risk  3.4    3.3  Avg.Risk  5.0    4.4                                                         2X Avg.Risk  9.6    7.1                                                         3X Avg.Risk 23.4   11.0         Assessment & Plan:  1. Other and unspecified hyperlipidemia Lose weight, resume red yeast rice.  2. Unspecified essential hypertension - amLODipine (NORVASC) 10 MG tablet; One daily to lower BP  Dispense: 90 tablet; Refill: 3  3. Obesity, unspecified Lose weight  4. Carotid artery disease <69% occlusion on the left side  5. Dizziness and giddiness resolved  6. Insomnia, unspecified improved  7. Reflux esophagitis resolved

## 2013-04-23 ENCOUNTER — Other Ambulatory Visit: Payer: Self-pay | Admitting: Internal Medicine

## 2013-04-30 ENCOUNTER — Other Ambulatory Visit: Payer: Self-pay | Admitting: *Deleted

## 2013-04-30 MED ORDER — LORAZEPAM 1 MG PO TABS: ORAL_TABLET | ORAL | Status: DC

## 2013-05-08 ENCOUNTER — Ambulatory Visit (HOSPITAL_COMMUNITY)
Admission: RE | Admit: 2013-05-08 | Discharge: 2013-05-08 | Disposition: A | Discharge status: Home / Self Care | Payer: Medicare Other | Source: Ambulatory Visit | Attending: Cardiovascular Disease | Admitting: Cardiovascular Disease

## 2013-05-08 DIAGNOSIS — I672 Cerebral atherosclerosis: Secondary | ICD-10-CM | POA: Insufficient documentation

## 2013-05-08 DIAGNOSIS — I6529 Occlusion and stenosis of unspecified carotid artery: Secondary | ICD-10-CM

## 2013-05-08 NOTE — Progress Notes (Signed)
Carotid Duplex Completed. Jona Zappone, BS, RDMS, RVT  

## 2013-05-14 ENCOUNTER — Telehealth: Payer: Self-pay | Admitting: Cardiovascular Disease

## 2013-05-14 NOTE — Telephone Encounter (Signed)
Wanted to get results of his carotid study.  Please call

## 2013-05-14 NOTE — Telephone Encounter (Signed)
Returned call and spoke w/ pt.  Informed results have not been reviewed by MD/PA and nurse will call, mail letter or release in [place] after they are reviewed.  Pt verbalized understanding and agreed w/ plan.

## 2013-05-16 ENCOUNTER — Encounter: Payer: Self-pay | Admitting: *Deleted

## 2013-05-16 ENCOUNTER — Telehealth: Payer: Self-pay | Admitting: *Deleted

## 2013-05-16 DIAGNOSIS — I6529 Occlusion and stenosis of unspecified carotid artery: Secondary | ICD-10-CM

## 2013-05-16 NOTE — Telephone Encounter (Signed)
Message copied by Chauncy Lean on Wed May 16, 2013 12:45 PM ------      Message from: Lorretta Harp      Created: Wed May 16, 2013  9:43 AM       No change from prior study. Repeat in 12 months. ------

## 2013-05-16 NOTE — Telephone Encounter (Signed)
Order placed for repeat carotid doppler in 1 year 

## 2013-05-28 ENCOUNTER — Other Ambulatory Visit: Payer: Self-pay | Admitting: *Deleted

## 2013-05-28 MED ORDER — LORAZEPAM 1 MG PO TABS: ORAL_TABLET | ORAL | Status: DC

## 2013-05-30 ENCOUNTER — Other Ambulatory Visit: Payer: Self-pay | Admitting: *Deleted

## 2013-06-18 ENCOUNTER — Other Ambulatory Visit: Payer: Self-pay | Admitting: *Deleted

## 2013-06-18 MED ORDER — DOXEPIN HCL 25 MG PO CAPS: ORAL_CAPSULE | ORAL | Status: DC

## 2013-06-27 ENCOUNTER — Other Ambulatory Visit: Payer: Self-pay | Admitting: *Deleted

## 2013-06-27 MED ORDER — DIAZEPAM 5 MG PO TABS: ORAL_TABLET | ORAL | Status: DC

## 2013-08-15 ENCOUNTER — Telehealth: Payer: Self-pay | Admitting: *Deleted

## 2013-08-15 NOTE — Telephone Encounter (Signed)
Received a fax from [facility] stating that patient wanted to stop the Doxepin. Per Dr. Cyndia Skeeters patient and ask him if he wants a substitute or does he just want to stop the medication. I called patient and he stated that he just wants to stop it. Taken out of medication list.

## 2013-09-10 ENCOUNTER — Telehealth: Payer: Self-pay

## 2013-09-10 MED ORDER — DIAZEPAM 5 MG PO TABS: ORAL_TABLET | ORAL | Status: DC

## 2013-09-10 NOTE — Telephone Encounter (Signed)
RX called into [facility] #30/1 refill

## 2013-09-10 NOTE — Telephone Encounter (Signed)
It is OK to dispense 30 tabs.

## 2013-09-10 NOTE — Telephone Encounter (Signed)
Patient states although he is taking diazepam  1/2 by mouth daily as needed he needs rx for #30 vs #15, Dr.Green please advise if this is ok ?

## 2013-10-05 ENCOUNTER — Other Ambulatory Visit: Payer: 59

## 2013-10-08 ENCOUNTER — Other Ambulatory Visit: Payer: 59

## 2013-10-08 DIAGNOSIS — E785 Hyperlipidemia, unspecified: Secondary | ICD-10-CM

## 2013-10-08 DIAGNOSIS — I10 Essential (primary) hypertension: Secondary | ICD-10-CM

## 2013-10-09 ENCOUNTER — Ambulatory Visit (INDEPENDENT_AMBULATORY_CARE_PROVIDER_SITE_OTHER): Payer: 59 | Admitting: Internal Medicine

## 2013-10-09 ENCOUNTER — Encounter: Payer: Self-pay | Admitting: Internal Medicine

## 2013-10-09 VITALS — BP 132/80 | HR 93 | Temp 98.4°F | Wt 205.0 lb

## 2013-10-09 DIAGNOSIS — N401 Enlarged prostate with lower urinary tract symptoms: Secondary | ICD-10-CM

## 2013-10-09 DIAGNOSIS — I779 Disorder of arteries and arterioles, unspecified: Secondary | ICD-10-CM

## 2013-10-09 DIAGNOSIS — E669 Obesity, unspecified: Secondary | ICD-10-CM

## 2013-10-09 DIAGNOSIS — N138 Other obstructive and reflux uropathy: Secondary | ICD-10-CM

## 2013-10-09 DIAGNOSIS — I2510 Atherosclerotic heart disease of native coronary artery without angina pectoris: Secondary | ICD-10-CM

## 2013-10-09 DIAGNOSIS — G4700 Insomnia, unspecified: Secondary | ICD-10-CM

## 2013-10-09 DIAGNOSIS — R609 Edema, unspecified: Secondary | ICD-10-CM

## 2013-10-09 DIAGNOSIS — I10 Essential (primary) hypertension: Secondary | ICD-10-CM

## 2013-10-09 DIAGNOSIS — E785 Hyperlipidemia, unspecified: Secondary | ICD-10-CM

## 2013-10-09 DIAGNOSIS — R42 Dizziness and giddiness: Secondary | ICD-10-CM

## 2013-10-09 DIAGNOSIS — I739 Peripheral vascular disease, unspecified: Secondary | ICD-10-CM

## 2013-10-09 DIAGNOSIS — R600 Localized edema: Secondary | ICD-10-CM | POA: Insufficient documentation

## 2013-10-09 LAB — BASIC METABOLIC PANEL
BUN/Creatinine Ratio: 12 (ref 10–22)
BUN: 12 mg/dL (ref 8–27)
CALCIUM: 8.9 mg/dL (ref 8.6–10.2)
CO2: 24 mmol/L (ref 18–29)
Chloride: 101 mmol/L (ref 97–108)
Creatinine, Ser: 1.03 mg/dL (ref 0.76–1.27)
GFR calc Af Amer: 79 mL/min/{1.73_m2} (ref >59)
GFR, EST NON AFRICAN AMERICAN: 68 mL/min/{1.73_m2} (ref >59)
GLUCOSE: 93 mg/dL (ref 65–99)
POTASSIUM: 4 mmol/L (ref 3.5–5.2)
Sodium: 140 mmol/L (ref 134–144)

## 2013-10-09 LAB — LIPID PANEL
CHOL/HDL RATIO: 5.4 ratio — AB (ref 0.0–5.0)
Cholesterol, Total: 225 mg/dL — ABNORMAL HIGH (ref 100–199)
HDL: 42 mg/dL (ref >39)
LDL Calculated: 159 mg/dL — ABNORMAL HIGH (ref 0–99)
Triglycerides: 121 mg/dL (ref 0–149)
VLDL Cholesterol Cal: 24 mg/dL (ref 5–40)

## 2013-10-09 MED ORDER — ROSUVASTATIN CALCIUM 10 MG PO TABS: ORAL_TABLET | ORAL | Status: DC

## 2013-10-09 MED ORDER — SUVOREXANT 15 MG PO TABS: 15.0000 mg | ORAL_TABLET | Freq: Every evening | ORAL | Status: DC | PRN

## 2013-10-09 MED ORDER — RAMIPRIL 5 MG PO CAPS: 5.0000 mg | ORAL_CAPSULE | Freq: Every day | ORAL | Status: DC

## 2013-10-09 NOTE — Patient Instructions (Addendum)
Stop Norvasc. Stop Lorazepam. Start new medications as on your list.

## 2013-10-09 NOTE — Progress Notes (Signed)
Patient ID: Alex [name], male   DOB: 1931/07/08, 80 y.o.   MRN: [id number]    Location:    [facility]  Place of Service:  OFFICE    No Known Allergies  Chief Complaint  Patient presents with  . Medical Management of Chronic Issues    6 month follow-up, discuss labs (copy printed)   . Edema    Bilateral ankle swelling   . Medication Management    Discuss Norvasc-? related to leg/ankle swelling     HPI:  Other and unspecified hyperlipidemia: He wants to avoid statins due to leg cramps. Usin red yeast rice and niacin. Uses K2.   Unspecified essential hypertension: ankles swollen since on Norvasc. Would like to stop and resume ramipril.  Obesity, unspecified: overweight and not losing weight  Carotid artery disease: Carotid Doppler on 05/15/13. Mild obstructive disease. Repeat in on year.  Coronary atherosclerosis of unspecified type of vessel, native or graft: stble. No angina  Leg cramps: frequent. May be shy on fluid intake. Has not had leg cramps since he was on Valium. He uses 1/2 tablet at night.  Insomnia. He tapered off Valium. Using Ativan. Goes to bathroom several times each night. Doxepin helped, but makes him very groggy. He wants to use a higher dose of Ativan. He does not want to use doxepin.  Nocturia: present for 5 months.     Medications: Patient's Medications  New Prescriptions   No medications on file  Previous Medications   ADVAIR DISKUS 250-50 MCG/DOSE AEPB    inhale 1 puff by mouth twice a day   AMLODIPINE (NORVASC) 10 MG TABLET    One daily to lower BP   ASPIRIN 81 MG TABLET    Take 81 mg by mouth daily. Take one tablet once a day   B COMPLEX VITAMINS TABLET    Take 1 tablet by mouth daily.   CHOLECALCIFEROL (VITAMIN D) 1000 UNITS TABLET    Take 1,000 Units by mouth daily. Take two tablets daily   CINNAMON 500 MG TABS    Take 1 tablet by mouth daily.   COCONUT OIL 1000 MG CAPS    Take 1 capsule by mouth daily.   COENZYME Q10 (CO Q 10) 100 MG CAPS    Take  1 capsule by mouth daily.   DIAZEPAM (VALIUM) 5 MG TABLET    Take 1/2 tablet by mouth at night for dizziness as needed   FISH OIL-OMEGA-3 FATTY ACIDS 1000 MG CAPSULE    Take 2 g by mouth daily. Take one tablet once a day for cholesterol   GARCINIA CAMBOGIA-CHROMIUM 500-200 MG-MCG TABS    Take 1 tablet by mouth daily.   IBUPROFEN (ADVIL,MOTRIN) 200 MG TABLET    Take 200 mg by mouth every 6 (six) hours as needed for pain. Take 3-4 tablets twice a day for neck pain as needed   LORAZEPAM (ATIVAN) 1 MG TABLET    One tablet by mouth nightly if needed for rest   MENAQUINONE-7 (VITAMIN K2 PO)    Take 50 mcg by mouth daily.   NUTRITIONAL SUPPLEMENTS (NUTRITIONAL SUPPLEMENT PO)    Synerflex. Take 2 tablets by mouth daily.   NUTRITIONAL SUPPLEMENTS (NUTRITIONAL SUPPLEMENT PO)    Ginger and Turmeric. Take 1 capsule by mouth daily.   POTASSIUM 99 MG TABS    Take by mouth. Take one tablet once a day for cramps   RED YEAST RICE EXTRACT 600 MG TABS    Take by mouth. Take 2 tablets  once a day for cholesterol   RESVERATROL 100 MG CAPS    Take by mouth. Take one tablet once a day  Modified Medications   No medications on file  Discontinued Medications   No medications on file     Review of Systems  Constitutional: Negative for fever, activity change, appetite change, fatigue and unexpected weight change.  HENT: Positive for hearing loss. Negative for ear discharge.   Eyes: Negative.   Respiratory: Negative.  Negative for shortness of breath.   Cardiovascular: Positive for leg swelling. Negative for chest pain.  Gastrointestinal:       Occasional heartburn which seems well controlled. History of hemorrhoids.  Endocrine: Negative.   Genitourinary: Negative.        History of kidney stones. Nocturia.  Musculoskeletal: Positive for arthralgias and back pain. Negative for neck pain and neck stiffness.       Leg cramps in the past  Skin: Negative.   Neurological:       Episodes of dizziness. None recent.  Had an episode of syncope after severe coughing.  Hematological: Negative.   Psychiatric/Behavioral: Negative.        Mild insomnia.    Filed Vitals:   10/09/13 1323  BP: 132/80  Pulse: 93  Temp: 98.4 F (36.9 C)  TempSrc: Oral  Weight: 205 lb (92.987 kg)  SpO2: 97%   Body mass index is 30.26 kg/(m^2).  Physical Exam  Constitutional: He is oriented to person, place, and time.  Overweight.  HENT:  Mild loss of hearing bilaterally.  Eyes:  Corrective lenses.  Neck: No JVD present. No tracheal deviation present. No thyromegaly present.  Cardiovascular: Normal rate, regular rhythm, normal heart sounds and intact distal pulses.  Exam reveals no gallop and no friction rub.   No murmur heard. Pulmonary/Chest: Breath sounds normal. No respiratory distress. He has no wheezes. He has no rales. He exhibits no tenderness.  Abdominal: Soft. Bowel sounds are normal. He exhibits no distension and no mass. There is no tenderness.  Musculoskeletal: Normal range of motion. He exhibits no edema and no tenderness.  Lymphadenopathy:    He has no cervical adenopathy.  Neurological: He is alert and oriented to person, place, and time. He has normal reflexes. No cranial nerve deficit. Coordination normal.  Skin: Skin is warm and dry. No rash noted. No erythema. No pallor.  Psychiatric: He has a normal mood and affect. His behavior is normal. Judgment and thought content normal.     Labs reviewed: Appointment on 10/08/2013  Component Date Value Ref Range Status  . Cholesterol, Total 10/08/2013 225* 100 - 199 mg/dL Final  . Triglycerides 10/08/2013 121  0 - 149 mg/dL Final  . HDL 10/08/2013 42  >39 mg/dL Final   Comment: According to ATP-III Guidelines, HDL-C >59 mg/dL is considered a                          negative risk factor for CHD.  Marland Kitchen VLDL Cholesterol Cal 10/08/2013 24  5 - 40 mg/dL Final  . LDL Calculated 10/08/2013 159* 0 - 99 mg/dL Final  . Chol/HDL Ratio 10/08/2013 5.4* 0.0 - 5.0  ratio units Final   Comment:                                   T. Chol/HDL Ratio  Men  Women                                                        1/2 Avg.Risk  3.4    3.3                                                            Avg.Risk  5.0    4.4                                                         2X Avg.Risk  9.6    7.1                                                         3X Avg.Risk 23.4   11.0  . Glucose 10/08/2013 93  65 - 99 mg/dL Final  . BUN 10/08/2013 12  8 - 27 mg/dL Final  . Creatinine, Ser 10/08/2013 1.03  0.76 - 1.27 mg/dL Final  . GFR calc non Af Amer 10/08/2013 68  >59 mL/min/1.73 Final  . GFR calc Af Amer 10/08/2013 79  >59 mL/min/1.73 Final  . BUN/Creatinine Ratio 10/08/2013 12  10 - 22 Final  . Sodium 10/08/2013 140  134 - 144 mmol/L Final  . Potassium 10/08/2013 4.0  3.5 - 5.2 mmol/L Final  . Chloride 10/08/2013 101  97 - 108 mmol/L Final  . CO2 10/08/2013 24  18 - 29 mmol/L Final  . Calcium 10/08/2013 8.9  8.6 - 10.2 mg/dL Final      Assessment/Plan  1. Other and unspecified hyperlipidemia Continues running moderately high LDL. With his history of carotid artery disease and coronary artery disease, we have had a lengthy discussion about lowering the LDL. I believe this is going to take a more aggressive approach with medications. He has been absolutely resistant to this in the past, but today he consents to a trial of atorvastatin. - Lipid panel; Future  2. Unspecified essential hypertension Controlled  3. Obesity, unspecified Stable. Excessive weight may contribute to his hypertension. - ramipril (ALTACE) 5 MG capsule; Take 1 capsule (5 mg total) by mouth daily.  Dispense: 90 capsule; Refill: 3 - Comprehensive metabolic panel; Future  4. Carotid artery disease Stable.  5. Coronary atherosclerosis of unspecified type of vessel, native or graft Stable. No angina.  6. Insomnia,  unspecified - Suvorexant (BELSOMRA) 15 MG TABS; Take 15 mg by mouth at bedtime as needed. One at bedtime for sleep  Dispense: 30 tablet; Refill: 4  7. Hypertrophy of prostate with urinary obstruction and other lower urinary tract symptoms (LUTS) Continue to follow. No change in medication.  8. Edema Mild  9. Dizziness and giddiness Intermittent

## 2013-10-10 ENCOUNTER — Telehealth: Payer: Self-pay | Admitting: *Deleted

## 2013-10-10 NOTE — Telephone Encounter (Signed)
Patient called and stated that he needs the Statin Rosuvastin which is $240 changed to Atorvastatin which is only $4. Please Advise.  Pharmacy [facility] [phone] [place]

## 2013-10-11 NOTE — Telephone Encounter (Signed)
It is OK to change him to atorvastatin 10 mg daily.

## 2013-10-15 ENCOUNTER — Other Ambulatory Visit: Payer: Self-pay | Admitting: *Deleted

## 2013-10-15 MED ORDER — ATORVASTATIN CALCIUM 10 MG PO TABS: ORAL_TABLET | ORAL | Status: DC

## 2013-10-15 NOTE — Telephone Encounter (Signed)
Faxed Rx into pharmacy and patient Notified.

## 2013-12-05 ENCOUNTER — Other Ambulatory Visit: Payer: Self-pay | Admitting: *Deleted

## 2013-12-05 MED ORDER — LORAZEPAM 1 MG PO TABS: ORAL_TABLET | ORAL | Status: DC

## 2013-12-05 NOTE — Telephone Encounter (Signed)
[facility] [place]

## 2014-01-08 ENCOUNTER — Other Ambulatory Visit: Payer: Self-pay | Admitting: *Deleted

## 2014-01-08 ENCOUNTER — Other Ambulatory Visit: Payer: Self-pay | Admitting: Internal Medicine

## 2014-01-08 MED ORDER — ATORVASTATIN CALCIUM 10 MG PO TABS: ORAL_TABLET | ORAL | Status: DC

## 2014-01-08 NOTE — Telephone Encounter (Signed)
[facility] [place] 

## 2014-01-28 ENCOUNTER — Other Ambulatory Visit: Payer: 59

## 2014-01-28 DIAGNOSIS — E669 Obesity, unspecified: Secondary | ICD-10-CM

## 2014-01-28 DIAGNOSIS — E785 Hyperlipidemia, unspecified: Secondary | ICD-10-CM

## 2014-01-29 LAB — LIPID PANEL
CHOLESTEROL TOTAL: 205 mg/dL — AB (ref 100–199)
Chol/HDL Ratio: 4.8 ratio units (ref 0.0–5.0)
HDL: 43 mg/dL (ref >39)
LDL Calculated: 140 mg/dL — ABNORMAL HIGH (ref 0–99)
Triglycerides: 110 mg/dL (ref 0–149)
VLDL CHOLESTEROL CAL: 22 mg/dL (ref 5–40)

## 2014-01-29 LAB — COMPREHENSIVE METABOLIC PANEL
ALBUMIN: 4.1 g/dL (ref 3.5–4.7)
ALK PHOS: 46 IU/L (ref 39–117)
ALT: 15 IU/L (ref 0–44)
AST: 14 IU/L (ref 0–40)
Albumin/Globulin Ratio: 1.6 (ref 1.1–2.5)
BILIRUBIN TOTAL: 0.5 mg/dL (ref 0.0–1.2)
BUN / CREAT RATIO: 9 — AB (ref 10–22)
BUN: 10 mg/dL (ref 8–27)
CHLORIDE: 100 mmol/L (ref 97–108)
CO2: 25 mmol/L (ref 18–29)
CREATININE: 1.13 mg/dL (ref 0.76–1.27)
Calcium: 9.2 mg/dL (ref 8.6–10.2)
GFR calc non Af Amer: 61 mL/min/{1.73_m2} (ref >59)
GFR, EST AFRICAN AMERICAN: 71 mL/min/{1.73_m2} (ref >59)
GLOBULIN, TOTAL: 2.6 g/dL (ref 1.5–4.5)
Glucose: 95 mg/dL (ref 65–99)
Potassium: 4.3 mmol/L (ref 3.5–5.2)
Sodium: 140 mmol/L (ref 134–144)
Total Protein: 6.7 g/dL (ref 6.0–8.5)

## 2014-01-30 ENCOUNTER — Ambulatory Visit (INDEPENDENT_AMBULATORY_CARE_PROVIDER_SITE_OTHER): Payer: 59 | Admitting: Internal Medicine

## 2014-01-30 ENCOUNTER — Encounter: Payer: Self-pay | Admitting: Internal Medicine

## 2014-01-30 VITALS — BP 154/86 | HR 73 | Temp 98.5°F | Ht 69.0 in | Wt 205.2 lb

## 2014-01-30 DIAGNOSIS — M25569 Pain in unspecified knee: Secondary | ICD-10-CM

## 2014-01-30 DIAGNOSIS — E785 Hyperlipidemia, unspecified: Secondary | ICD-10-CM

## 2014-01-30 DIAGNOSIS — H6120 Impacted cerumen, unspecified ear: Secondary | ICD-10-CM

## 2014-01-30 DIAGNOSIS — I10 Essential (primary) hypertension: Secondary | ICD-10-CM

## 2014-01-30 DIAGNOSIS — R42 Dizziness and giddiness: Secondary | ICD-10-CM

## 2014-01-30 DIAGNOSIS — H6123 Impacted cerumen, bilateral: Secondary | ICD-10-CM

## 2014-01-30 DIAGNOSIS — R609 Edema, unspecified: Secondary | ICD-10-CM

## 2014-01-30 DIAGNOSIS — M25562 Pain in left knee: Principal | ICD-10-CM

## 2014-01-30 DIAGNOSIS — I2510 Atherosclerotic heart disease of native coronary artery without angina pectoris: Secondary | ICD-10-CM

## 2014-01-30 DIAGNOSIS — M25561 Pain in right knee: Secondary | ICD-10-CM | POA: Insufficient documentation

## 2014-01-30 MED ORDER — DIAZEPAM 5 MG PO TABS: ORAL_TABLET | ORAL | Status: DC

## 2014-01-30 NOTE — Progress Notes (Signed)
Patient ID: Alex CISLO, male   DOB: 04-09-1932, 80 y.o.   MRN: [id number]      PAM    Place of Service:   OFFICE    No Known Allergies  Chief Complaint  Patient presents with  . Medical Management of Chronic Issues    3 Month Follow up    HPI:  Bilateral knee pain: noted for the first time. No swelling or erythema. No inflammation.   Edema: improved  Other and unspecified hyperlipidemia: he quit the atorvastatin when knees started to hurt  Unspecified essential hypertension: mild elevation in the SBP  Coronary atherosclerosis of unspecified type of vessel, native or graft: NO ANGINa  Dizziness and giddiness - intermittent. Diazepam helps.    Medications: Patient's Medications  New Prescriptions   No medications on file  Previous Medications   ADVAIR DISKUS 250-50 MCG/DOSE AEPB    inhale 1 puff by mouth twice a day   ASPIRIN 81 MG TABLET    Take 81 mg by mouth daily. Take one tablet once a day   ATORVASTATIN (LIPITOR) 10 MG TABLET    Take one tablet by mouth once daily for cholesterol   B COMPLEX VITAMINS TABLET    Take 1 tablet by mouth daily.   CHOLECALCIFEROL (VITAMIN D) 1000 UNITS TABLET    Take 1,000 Units by mouth daily. Take two tablets daily   CINNAMON 500 MG TABS    Take 1 tablet by mouth daily.   CITRUS BERGAMOT PO    Take by mouth. Take one capsule once daily to lower cholesterol   COCONUT OIL 1000 MG CAPS    Take 1 capsule by mouth daily.   COENZYME Q10 (CO Q 10) 100 MG CAPS    Take 1 capsule by mouth daily.   DIAZEPAM (VALIUM) 5 MG TABLET    take 1/2 tablet by mouth AT NIGHT FOR DIZZINESS IF NEEDED   FISH OIL-OMEGA-3 FATTY ACIDS 1000 MG CAPSULE    Take 2 g by mouth daily. Take one tablet once a day for cholesterol   GARCINIA CAMBOGIA-CHROMIUM 500-200 MG-MCG TABS    Take 1 tablet by mouth daily.   IBUPROFEN (ADVIL,MOTRIN) 200 MG TABLET    Take 200 mg by mouth every 6 (six) hours as needed for pain. Take 3-4 tablets twice a day for neck pain as needed   LORAZEPAM (ATIVAN) 1 MG TABLET    Take one tablet by mouth at bedtime as needed for rest   MENAQUINONE-7 (VITAMIN K2 PO)    Take 50 mcg by mouth daily.   NUTRITIONAL SUPPLEMENTS (NUTRITIONAL SUPPLEMENT PO)    Synerflex. Take 2 tablets by mouth daily.   NUTRITIONAL SUPPLEMENTS (NUTRITIONAL SUPPLEMENT PO)    Ginger and Turmeric. Take 1 capsule by mouth daily.   POTASSIUM 99 MG TABS    Take by mouth. Take one tablet once a day for cramps   RAMIPRIL (ALTACE) 5 MG CAPSULE    Take 1 capsule (5 mg total) by mouth daily.   RED YEAST RICE EXTRACT 600 MG TABS    Take by mouth. Take 2 tablets once a day for cholesterol   RESVERATROL 100 MG CAPS    Take by mouth. Take one tablet once a day  Modified Medications   No medications on file  Discontinued Medications   SUVOREXANT (BELSOMRA) 15 MG TABS    Take 15 mg by mouth at bedtime as needed. One at bedtime for sleep     Review of Systems  Constitutional:  Negative for fever, activity change, appetite change, fatigue and unexpected weight change.  HENT: Positive for hearing loss. Negative for ear discharge.   Eyes: Negative.   Respiratory: Negative.  Negative for shortness of breath.   Cardiovascular: Positive for leg swelling. Negative for chest pain.  Gastrointestinal:       Occasional heartburn which seems well controlled. History of hemorrhoids.  Endocrine: Negative.   Genitourinary: Negative.        History of kidney stones. Nocturia.  Musculoskeletal: Positive for arthralgias and back pain. Negative for neck pain and neck stiffness.       Leg cramps in the past  Skin: Negative.   Neurological:       Episodes of dizziness. None recent. Had an episode of syncope after severe coughing.  Hematological: Negative.   Psychiatric/Behavioral: Negative.        Mild insomnia.    Filed Vitals:   01/30/14 1229  BP: 154/86  Pulse: 73  Temp: 98.5 F (36.9 C)  TempSrc: Oral  Height: 5\' 9"  (1.753 m)  Weight: 205 lb 3.2 oz (93.078 kg)   Body mass  index is 30.29 kg/(m^2).  Physical Exam  Constitutional: He is oriented to person, place, and time.  Overweight.  HENT:  Mild loss of hearing bilaterally. Bilateral cerumen impaction.  Eyes:  Corrective lenses.  Neck: No JVD present. No tracheal deviation present. No thyromegaly present.  Cardiovascular: Normal rate, regular rhythm, normal heart sounds and intact distal pulses.  Exam reveals no gallop and no friction rub.   No murmur heard. Pulmonary/Chest: Breath sounds normal. No respiratory distress. He has no wheezes. He has no rales. He exhibits no tenderness.  Abdominal: Soft. Bowel sounds are normal. He exhibits no distension and no mass. There is no tenderness.  Musculoskeletal: Normal range of motion. He exhibits no edema and no tenderness.  Lymphadenopathy:    He has no cervical adenopathy.  Neurological: He is alert and oriented to person, place, and time. He has normal reflexes. No cranial nerve deficit. Coordination normal.  Skin: Skin is warm and dry. No rash noted. No erythema. No pallor.  Psychiatric: He has a normal mood and affect. His behavior is normal. Judgment and thought content normal.     Labs reviewed: Appointment on 01/28/2014  Component Date Value Ref Range Status  . Cholesterol, Total 01/28/2014 205* 100 - 199 mg/dL Final  . Triglycerides 01/28/2014 110  0 - 149 mg/dL Final  . HDL 01/28/2014 43  >39 mg/dL Final   Comment: According to ATP-III Guidelines, HDL-C >59 mg/dL is considered a                          negative risk factor for CHD.  Marland Kitchen VLDL Cholesterol Cal 01/28/2014 22  5 - 40 mg/dL Final  . LDL Calculated 01/28/2014 140* 0 - 99 mg/dL Final  . Chol/HDL Ratio 01/28/2014 4.8  0.0 - 5.0 ratio units Final   Comment:                                   T. Chol/HDL Ratio  Men  Women                                                        1/2 Avg.Risk  3.4    3.3                                                             Avg.Risk  5.0    4.4                                                         2X Avg.Risk  9.6    7.1                                                         3X Avg.Risk 23.4   11.0  . Glucose 01/28/2014 95  65 - 99 mg/dL Final  . BUN 01/28/2014 10  8 - 27 mg/dL Final  . Creatinine, Ser 01/28/2014 1.13  0.76 - 1.27 mg/dL Final  . GFR calc non Af Amer 01/28/2014 61  >59 mL/min/1.73 Final  . GFR calc Af Amer 01/28/2014 71  >59 mL/min/1.73 Final  . BUN/Creatinine Ratio 01/28/2014 9* 10 - 22 Final  . Sodium 01/28/2014 140  134 - 144 mmol/L Final  . Potassium 01/28/2014 4.3  3.5 - 5.2 mmol/L Final  . Chloride 01/28/2014 100  97 - 108 mmol/L Final  . CO2 01/28/2014 25  18 - 29 mmol/L Final  . Calcium 01/28/2014 9.2  8.6 - 10.2 mg/dL Final  . Total Protein 01/28/2014 6.7  6.0 - 8.5 g/dL Final  . Albumin 01/28/2014 4.1  3.5 - 4.7 g/dL Final  . Globulin, Total 01/28/2014 2.6  1.5 - 4.5 g/dL Final  . Albumin/Globulin Ratio 01/28/2014 1.6  1.1 - 2.5 Final  . Total Bilirubin 01/28/2014 0.5  0.0 - 1.2 mg/dL Final  . Alkaline Phosphatase 01/28/2014 46  39 - 117 IU/L Final  . AST 01/28/2014 14  0 - 40 IU/L Final  . ALT 01/28/2014 15  0 - 44 IU/L Final     Assessment/Plan  1. Bilateral knee pain Tylenol 500 mg tid  2. Edema improved  3. Other and unspecified hyperlipidemia Continue bergamot - Lipid panel; Future  4. Unspecified essential hypertension Observe. - Basic metabolic panel; Future  5. Coronary atherosclerosis of unspecified type of vessel, native or graft staable  6. Dizziness and giddiness - diazepam (VALIUM) 5 MG tablet; 1/2 tablet up to twice daily if needed for dizziness  Dispense: 30 tablet; Refill: 1  7. Cerumen impaction, bilateral Lavaged and removed with instruments

## 2014-02-05 ENCOUNTER — Encounter: Payer: Self-pay | Admitting: Internal Medicine

## 2014-04-19 ENCOUNTER — Other Ambulatory Visit: Payer: Self-pay | Admitting: Internal Medicine

## 2014-05-10 ENCOUNTER — Other Ambulatory Visit: Payer: Self-pay | Admitting: Internal Medicine

## 2014-05-14 ENCOUNTER — Ambulatory Visit (HOSPITAL_COMMUNITY)
Admission: RE | Admit: 2014-05-14 | Discharge: 2014-05-14 | Disposition: A | Discharge status: Home / Self Care | Payer: Medicare HMO | Source: Ambulatory Visit | Attending: Cardiovascular Disease | Admitting: Cardiovascular Disease

## 2014-05-14 ENCOUNTER — Telehealth: Payer: Self-pay | Admitting: *Deleted

## 2014-05-14 DIAGNOSIS — I672 Cerebral atherosclerosis: Secondary | ICD-10-CM | POA: Diagnosis not present

## 2014-05-14 DIAGNOSIS — I6529 Occlusion and stenosis of unspecified carotid artery: Secondary | ICD-10-CM

## 2014-05-14 DIAGNOSIS — I6522 Occlusion and stenosis of left carotid artery: Secondary | ICD-10-CM

## 2014-05-14 DIAGNOSIS — I779 Disorder of arteries and arterioles, unspecified: Secondary | ICD-10-CM

## 2014-05-14 DIAGNOSIS — I739 Peripheral vascular disease, unspecified: Secondary | ICD-10-CM

## 2014-05-14 NOTE — Progress Notes (Signed)
Carotid Duplex Completed. Preliminary results by tech -  70-99% moderate progression of disease in the left ICA when compared to prior study.  Oda Cogan, BS, RDMS, RVT

## 2014-05-14 NOTE — Telephone Encounter (Signed)
Pt has been notified of need for appt. Appt scheduled for 05/15/14 at 1000. Pt verbalized understanding

## 2014-05-14 NOTE — Telephone Encounter (Signed)
Pt needs to be seen tomorrow for positive carotid dopplers. Double Book around 1000 on 05/15/14.

## 2014-05-15 ENCOUNTER — Encounter: Payer: Self-pay | Admitting: Cardiovascular Disease

## 2014-05-15 ENCOUNTER — Other Ambulatory Visit: Payer: Self-pay | Admitting: Cardiovascular Disease

## 2014-05-15 ENCOUNTER — Ambulatory Visit (INDEPENDENT_AMBULATORY_CARE_PROVIDER_SITE_OTHER): Payer: Commercial Managed Care - HMO | Admitting: Cardiovascular Disease

## 2014-05-15 VITALS — BP 138/78 | HR 89 | Ht 71.0 in | Wt 207.7 lb

## 2014-05-15 DIAGNOSIS — I10 Essential (primary) hypertension: Secondary | ICD-10-CM | POA: Diagnosis not present

## 2014-05-15 DIAGNOSIS — I739 Peripheral vascular disease, unspecified: Principal | ICD-10-CM

## 2014-05-15 DIAGNOSIS — I2510 Atherosclerotic heart disease of native coronary artery without angina pectoris: Secondary | ICD-10-CM | POA: Diagnosis not present

## 2014-05-15 DIAGNOSIS — I779 Disorder of arteries and arterioles, unspecified: Secondary | ICD-10-CM | POA: Diagnosis not present

## 2014-05-15 DIAGNOSIS — Z01812 Encounter for preprocedural laboratory examination: Secondary | ICD-10-CM | POA: Diagnosis not present

## 2014-05-15 NOTE — Patient Instructions (Signed)
Non-Cardiac CT Angiography (CTA), is a special type of CT scan that uses a computer to produce multi-dimensional views of major blood vessels throughout the body. In CT angiography, a contrast material is injected through an IV to help visualize the blood vessels  You have been referred to Dr. Harold Barban.  Your physician wants you to follow-up in 1 year with Dr. Gwenlyn Found. You will receive a reminder letter in the mail 2 months in advance. If you do not receive a letter, please call our office to schedule the follow-up appointment.

## 2014-05-15 NOTE — Progress Notes (Signed)
05/15/2014 Alex [name]   [birth year]  [id number]  Primary Physician GREEN, Viviann Spare, MD Primary Cardiologist: Lorretta Harp MD Renae Gloss   HPI:  Alex [name] is a delightful 81 year old widowed Caucasian male father of 78, grandfather to 5 grandchildren he was referred by Roque Cash [facility] [place] for evaluation of carotid artery disease. He has a history of coronary artery bypass grafting x4 in 2000 performed by Dr. Merilynn Finland. His other problems include remote tobacco abuse having quit in 1975. He does have a history of hypertension and hyperlipidemia currently not being treated. He's not had a stroke or a myocardial infarction. He had carotid Dopplers performed at [facility] clinic on 10/23/2012 that showed moderate left ICA stenosis. Follow-up carotid Dopplers performed office yesterday suggest marked and rapid progression of disease now with what appears to be a high-grade 90+ percent stenosis at the origin of his left internal carotid artery. He is neurologically asymptomatic.   Current Outpatient Prescriptions  Medication Sig Dispense Refill  . ADVAIR DISKUS 250-50 MCG/DOSE AEPB inhale 1 puff by mouth twice a day 60 each 5  . aspirin 81 MG tablet Take 81 mg by mouth daily. Take one tablet once a day    . b complex vitamins tablet Take 1 tablet by mouth daily.    . cholecalciferol (VITAMIN D) 1000 UNITS tablet Take 1,000 Units by mouth daily. Take two tablets daily    . Cinnamon 500 MG TABS Take 1 tablet by mouth daily.    Marland Kitchen CITRUS BERGAMOT PO Take by mouth. Take one capsule once daily to lower cholesterol    . Coconut Oil 1000 MG CAPS Take 1 capsule by mouth daily.    . Coenzyme Q10 (CO Q 10) 100 MG CAPS Take 1 capsule by mouth daily.    . diazepam (VALIUM) 5 MG tablet take 1/2 tablet by mouth UP TO TWICE A DAY AS NEEDED FOR DIZZINESS 30 tablet 0  . doxepin (SINEQUAN) 25 MG capsule   0  . fish oil-omega-3 fatty acids 1000 MG capsule Take 2 g by mouth daily.  Take one tablet once a day for cholesterol    . Garcinia Cambogia-Chromium 500-200 MG-MCG TABS Take 1 tablet by mouth daily.    Marland Kitchen ibuprofen (ADVIL,MOTRIN) 200 MG tablet Take 200 mg by mouth every 6 (six) hours as needed for pain. Take 3-4 tablets twice a day for neck pain as needed    . LORazepam (ATIVAN) 1 MG tablet Take one tablet by mouth at bedtime as needed for rest 30 tablet 5  . Menaquinone-7 (VITAMIN K2 PO) Take 50 mcg by mouth daily.    . Nutritional Supplements (NUTRITIONAL SUPPLEMENT PO) Synerflex. Take 2 tablets by mouth daily.    . Nutritional Supplements (NUTRITIONAL SUPPLEMENT PO) Ginger and Turmeric. Take 1 capsule by mouth daily.    . Potassium 99 MG TABS Take by mouth. Take one tablet once a day for cramps    . ramipril (ALTACE) 5 MG capsule Take 1 capsule (5 mg total) by mouth daily. 90 capsule 3  . RESVERATROL 100 MG CAPS Take by mouth. Take one tablet once a day     No current facility-administered medications for this visit.    No Known Allergies  History   Social History  . Marital Status: Widowed    Spouse Name: N/A    Number of Children: N/A  . Years of Education: N/A   Occupational History  . Not on file.  Social History Main Topics  . Smoking status: Former Smoker -- 1.50 packs/day for 22 years    Types: Cigarettes    Quit date: 05/03/1973  . Smokeless tobacco: Never Used  . Alcohol Use: Yes  . Drug Use: No  . Sexual Activity: No   Other Topics Concern  . Not on file   Social History Narrative     Review of Systems: General: negative for chills, fever, night sweats or weight changes.  Cardiovascular: negative for chest pain, dyspnea on exertion, edema, orthopnea, palpitations, paroxysmal nocturnal dyspnea or shortness of breath Dermatological: negative for rash Respiratory: negative for cough or wheezing Urologic: negative for hematuria Abdominal: negative for nausea, vomiting, diarrhea, bright red blood per rectum, melena, or  hematemesis Neurologic: negative for visual changes, syncope, or dizziness All other systems reviewed and are otherwise negative except as noted above.    Blood pressure 138/78, pulse 89, height 5\' 11"  (1.803 m), weight 207 lb 11.2 oz (94.212 kg).  General appearance: alert and no distress Neck: no adenopathy, no JVD, supple, symmetrical, trachea midline, thyroid not enlarged, symmetric, no tenderness/mass/nodules and soft left carotid bruit Lungs: clear to auscultation bilaterally Heart: regular rate and rhythm, S1, S2 normal, no murmur, click, rub or gallop Extremities: extremities normal, atraumatic, no cyanosis or edema  EKG normal sinus rhythm 89 without ST or T-wave changes. I personally reviewed this EKG  ASSESSMENT AND PLAN:   Coronary atherosclerosis History of coronary artery artery disease status post coronary artery bypass grafting in 2000 by Dr. Roxan Hockey. He has had a stress test performed by Roque Cash PA-C in [facility] approximately 2 years ago.he denies chest pain or shortness of breath.   Carotid artery disease History of carotid artery disease status post carotid Doppler studies performed yesterday that showed marked progression of disease in his left internal carotid artery compared to his last study one year ago. He now has what appears to be a high-grade lesion in his left internal carotid artery. I'm going to get a CT and confirm this and refer him to Dr. Trula Slade for vascular surgical consultation and discussion about the CREST 2  protocol.   Essential hypertension History of hypertension with blood pressure measured at 130/78. He is not on any antihypertensive medications   Other and unspecified hyperlipidemia History of hyperlipidemia with his last lipid profile performed 01/20/14 revealing an LDL of 140. He stopped his statin drugs because of side effects.       Lorretta Harp MD FACP,FACC,FAHA, [facility] 05/15/2014 10:36 AM

## 2014-05-15 NOTE — Assessment & Plan Note (Signed)
History of hyperlipidemia with his last lipid profile performed 01/20/14 revealing an LDL of 140. He stopped his statin drugs because of side effects.

## 2014-05-15 NOTE — Assessment & Plan Note (Signed)
History of hypertension with blood pressure measured at 130/78. He is not on any antihypertensive medications

## 2014-05-15 NOTE — Assessment & Plan Note (Signed)
History of coronary artery artery disease status post coronary artery bypass grafting in 2000 by Dr. Roxan Hockey. He has had a stress test performed by Roque Cash PA-C in [facility] approximately 2 years ago.he denies chest pain or shortness of breath.

## 2014-05-15 NOTE — Assessment & Plan Note (Signed)
History of carotid artery disease status post carotid Doppler studies performed yesterday that showed marked progression of disease in his left internal carotid artery compared to his last study one year ago. He now has what appears to be a high-grade lesion in his left internal carotid artery. I'm going to get a CT and confirm this and refer him to Dr. Trula Slade for vascular surgical consultation and discussion about the CREST 2  protocol.

## 2014-05-17 LAB — BUN+CREAT: BUN/Creatinine Ratio: 12.8 Ratio

## 2014-05-17 LAB — CREATININE, SERUM: CREATININE: 0.86 mg/dL (ref 0.50–1.35)

## 2014-05-17 LAB — BUN: BUN: 11 mg/dL (ref 6–23)

## 2014-05-21 ENCOUNTER — Ambulatory Visit (INDEPENDENT_AMBULATORY_CARE_PROVIDER_SITE_OTHER)
Admission: RE | Admit: 2014-05-21 | Discharge: 2014-05-21 | Disposition: A | Discharge status: Home / Self Care | Payer: Commercial Managed Care - HMO | Source: Ambulatory Visit | Attending: Cardiovascular Disease | Admitting: Cardiovascular Disease

## 2014-05-21 DIAGNOSIS — I739 Peripheral vascular disease, unspecified: Principal | ICD-10-CM

## 2014-05-21 DIAGNOSIS — I779 Disorder of arteries and arterioles, unspecified: Secondary | ICD-10-CM

## 2014-05-21 DIAGNOSIS — I6522 Occlusion and stenosis of left carotid artery: Secondary | ICD-10-CM | POA: Diagnosis not present

## 2014-05-21 MED ORDER — IOHEXOL 350 MG/ML SOLN
80.0000 mL | Freq: Once | INTRAVENOUS | Status: AC | PRN
  Administered 2014-05-21: 80 mL via INTRAVENOUS

## 2014-05-23 ENCOUNTER — Encounter: Payer: Self-pay | Admitting: Surgery

## 2014-05-27 ENCOUNTER — Ambulatory Visit (INDEPENDENT_AMBULATORY_CARE_PROVIDER_SITE_OTHER): Payer: Commercial Managed Care - HMO | Admitting: Surgery

## 2014-05-27 ENCOUNTER — Encounter: Payer: Self-pay | Admitting: Surgery

## 2014-05-27 ENCOUNTER — Other Ambulatory Visit: Payer: Self-pay

## 2014-05-27 ENCOUNTER — Other Ambulatory Visit: Payer: Self-pay | Admitting: *Deleted

## 2014-05-27 VITALS — BP 146/79 | HR 98 | Ht 71.0 in | Wt 206.0 lb

## 2014-05-27 DIAGNOSIS — I6522 Occlusion and stenosis of left carotid artery: Secondary | ICD-10-CM | POA: Diagnosis not present

## 2014-05-27 MED ORDER — LORAZEPAM 1 MG PO TABS: ORAL_TABLET | ORAL | Status: DC

## 2014-05-27 NOTE — Patient Instructions (Signed)
Our surgery / procedure schedulers will be contacting you as soon as possible. They will explain to you all the details of what you need to do prior to your surgery or procedure. They will also give you information on where to check in, time of arrival and where to park.    If you have not talked to them within 3 business days or just have more questions, please call [phone] and ask for either Stephanie, Carol, or Kay.      Carotid Artery Disease The carotid arteries are the two main arteries on either side of the neck that supply blood to the brain. Carotid artery disease, also called carotid artery stenosis, is the narrowing or blockage of one or both carotid arteries. Carotid artery disease increases your risk for a stroke or a transient ischemic attack (TIA). A TIA is an episode in which a waxy, fatty substance that accumulates within the artery (plaque) blocks blood flow to the brain. A TIA is considered a "warning stroke."  CAUSES   Buildup of plaque inside the carotid arteries (atherosclerosis) (common).  A weakened outpouching in an artery (aneurysm).  Inflammation of the carotid artery (arteritis).  A fibrous growth within the carotid artery (fibromuscular dysplasia).  Tissue death within the carotid artery due to radiation treatment (post-radiation necrosis).  Decreased blood flow due to spasms of the carotid artery (vasospasm).  Separation of the walls of the carotid artery (carotid dissection). RISK FACTORS  High cholesterol (dyslipidemia).   High blood pressure (hypertension).   Smoking.   Obesity.   Diabetes.   Family history of cardiovascular disease.   Inactivity or lack of regular exercise.   Being male. Men have an increased risk of developing atherosclerosis earlier in life than women.  SYMPTOMS  Carotid artery disease does not cause symptoms. DIAGNOSIS Diagnosis of carotid artery disease may include:   A physical exam. Your health  care provider may hear an abnormal sound (bruit) when listening to the carotid arteries.   Specific tests that look at the blood flow in the carotid arteries. These tests include:   Carotid artery ultrasonography.   Carotid or cerebral angiography.   Computerized tomographic angiography (CTA).   Magnetic resonance angiography (MRA).  TREATMENT  Treatment of carotid artery disease can include a combination of treatments. Treatment options include:  Surgery. You may have:   A carotid endarterectomy. This is a surgery to remove the blockages in the carotid arteries.   A carotid angioplasty with stenting. This is a nonsurgical interventional procedure. A wire mesh (stent) is used to widen the blocked carotid arteries.   Medicines to control blood pressure, cholesterol, and reduce blood clotting (antiplatelet therapy).   Adjusting your diet.   Lifestyle changes such as:   Quitting smoking.   Exercising as tolerated or as directed by your health care provider.   Controlling and maintaining a good blood pressure.   Keeping cholesterol levels under control.  HOME CARE INSTRUCTIONS   Take medicines only as directed by your health care provider. Make sure you understand all your medicine instructions. Do not stop your medicines without talking to your health care provider.   Follow your health care provider's diet instructions. It is important to eat a healthy diet that is low in saturated fats and includes plenty of fresh fruits, vegetables, and lean meats. High-fat, high-sodium foods as well as foods that are fried, overly processed, or have poor nutritional value should be avoided.  Maintain a healthy weight.   Stay physically   active. It is recommended that you get at least 30 minutes of activity every day.   Do not use any tobacco products including cigarettes, chewing tobacco, or electronic cigarettes. If you need help quitting, ask your health care  provider.  Limit alcohol use to:   No more than 2 drinks per day for men.   No more than 1 drink per day for nonpregnant women.   Do not use illegal drugs.   Keep all follow-up visits as directed by your health care provider.  SEEK IMMEDIATE MEDICAL CARE IF:  You develop TIA or stroke symptoms. These include:   Sudden weakness or numbness on one side of the body, such as in the face, arm, or leg.   Sudden confusion.   Trouble speaking (aphasia) or understanding.   Sudden trouble seeing out of one or both eyes.   Sudden trouble walking.   Dizziness or feeling like you might faint.   Loss of balance or coordination.   Sudden severe headache with no known cause.   Sudden trouble swallowing (dysphagia).  If you have any of these symptoms, call your local emergency services (911 in [place] Do not drive yourself to the clinic or hospital. This is a medical emergency.  Document Released: 07/12/2011 Document Revised: 09/03/2013 Document Reviewed: 10/18/2012 [employer] Patient Information 2015 [employer]. This information is not intended to replace advice given to you by your health care provider. Make sure you discuss any questions you have with your health care provider.  

## 2014-05-27 NOTE — Telephone Encounter (Signed)
[facility] [place] 

## 2014-05-27 NOTE — Progress Notes (Signed)
Patient name: Alex [name] MRN: [id number] DOB: 1932-01-20 Sex: male   Referred by: Dr. Gwenlyn Found  Reason for referral:  Chief Complaint  Patient presents with  . New Evaluation    L ICA stenosis     HISTORY OF PRESENT ILLNESS: This is a very pleasant 81 year old gentleman who is referred today for evaluation of progressive asymptomatic left carotid stenosis.specifically, the patient denies numbness or weakness in either extremity.  He denies slurred speech.  He denies amaurosis fugax.  He does complain of occasional dizziness or lightheadedness which has occurred over the past couple of days.  The patient had a ultrasound last year that showed less than 70% stenosis.  Most recently his ultrasound has progressed to greater than 70% stenosis.  He has had a CT scan which shows a string sign. The patient has a history of coronary artery disease and is status post CABG 4 in 2000.  The patient suffers from hypercholesterolemia.  His most recent LDL was 140.  He has been on the statins in the past approximately 15 years ago but has not been taking them.  He does have a prescription.  He also does not take an aspirin currently.  He is medically managed for hypertension.  He has a remote history of smoking.  Past Medical History  Diagnosis Date  . Urinary frequency   . 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 Cellulitis and abscess of unspecified site   . Obesity, unspecified   . Unspecified vitamin D deficiency   . Palpitations   . Hypertrophy of prostate with urinary obstruction and other lower urinary tract symptoms (LUTS)   . Insomnia, unspecified   . Acquired deformity of nose   . Cervicalgia   . Dizziness and giddiness   . Other premature beats   . Special screening for malignant neoplasm of prostate   . Slowing of urinary stream   . Shortness of breath   . Encounter for long-term  (current) use of other medications   . Esophagitis, unspecified   . Other and unspecified hyperlipidemia   . Calculus of kidney   . Unspecified essential hypertension   . Old myocardial infarction   . Coronary atherosclerosis of unspecified type of vessel, native or graft   . Internal hemorrhoids without mention of complication   . Carotid artery disease   . Hyperlipidemia   . CHF (congestive heart failure)     Past Surgical History  Procedure Laterality Date  . Tonsillectomy  1940  . Excisional hemorrhoidectomy  1981  . Cabg x 4  2000  . Lesion from forehead,(r) ear (l) arm      DR Almira Coaster  . Lithotripsy  2005    Dr Almira Coaster  . Esophagogastroduodenoscopy endoscopy  2007    Dr Lajoyce Corners, with biopsy  . Colonoscopy  2007    Dr Lajoyce Corners, normal  . Coronary artery bypass graft      History   Social History  . Marital Status: Widowed    Spouse Name: N/A    Number of Children: N/A  . Years of Education: N/A   Occupational History  . Not on file.   Social History Main Topics  . Smoking status: Former Smoker -- 1.50 packs/day for 22 years    Types: Cigarettes    Quit date: 05/03/1973  . Smokeless tobacco: Never Used  . Alcohol Use: 3.6  oz/week    3 Glasses of wine, 3 Cans of beer per week  . Drug Use: No  . Sexual Activity: No   Other Topics Concern  . Not on file   Social History Narrative    Family History  Problem Relation Age of Onset  . Cancer Mother     LIVER  . Cancer Brother     PROSTATE  . Cancer Daughter     Allergies as of 05/27/2014  . (No Known Allergies)    Current Outpatient Prescriptions on File Prior to Visit  Medication Sig Dispense Refill  . ADVAIR DISKUS 250-50 MCG/DOSE AEPB inhale 1 puff by mouth twice a day 60 each 5  . aspirin 81 MG tablet Take 81 mg by mouth daily. Take one tablet once a day    . b complex vitamins tablet Take 1 tablet by mouth daily.    . cholecalciferol (VITAMIN D) 1000 UNITS tablet Take 1,000 Units by mouth  daily. Take two tablets daily    . Cinnamon 500 MG TABS Take 1 tablet by mouth daily.    Marland Kitchen CITRUS BERGAMOT PO Take by mouth. Take one capsule once daily to lower cholesterol    . Coconut Oil 1000 MG CAPS Take 1 capsule by mouth daily.    . Coenzyme Q10 (CO Q 10) 100 MG CAPS Take 1 capsule by mouth daily.    . diazepam (VALIUM) 5 MG tablet take 1/2 tablet by mouth UP TO TWICE A DAY AS NEEDED FOR DIZZINESS 30 tablet 0  . doxepin (SINEQUAN) 25 MG capsule   0  . fish oil-omega-3 fatty acids 1000 MG capsule Take 2 g by mouth daily. Take one tablet once a day for cholesterol    . Garcinia Cambogia-Chromium 500-200 MG-MCG TABS Take 1 tablet by mouth daily.    Marland Kitchen ibuprofen (ADVIL,MOTRIN) 200 MG tablet Take 200 mg by mouth every 6 (six) hours as needed for pain. Take 3-4 tablets twice a day for neck pain as needed    . Menaquinone-7 (VITAMIN K2 PO) Take 50 mcg by mouth daily.    . Nutritional Supplements (NUTRITIONAL SUPPLEMENT PO) Synerflex. Take 2 tablets by mouth daily.    . Nutritional Supplements (NUTRITIONAL SUPPLEMENT PO) Ginger and Turmeric. Take 1 capsule by mouth daily.    . Potassium 99 MG TABS Take by mouth. Take one tablet once a day for cramps    . ramipril (ALTACE) 5 MG capsule Take 1 capsule (5 mg total) by mouth daily. 90 capsule 3  . RESVERATROL 100 MG CAPS Take by mouth. Take one tablet once a day     No current facility-administered medications on file prior to visit.     REVIEW OF SYSTEMS: Cardiovascular: No chest pain, chest pressure, palpitations, orthopnea, No claudication or rest pain,  No history of DVT or phlebitis.positive for shortness of breath with exertion Pulmonary: No productive cough, asthma.positive for wheezing. Neurologic: No weakness, paresthesias, aphasia, or amaurosis. occasional dizziness. Hematologic: No bleeding problems or clotting disorders. Musculoskeletal: No joint pain or joint swelling. Gastrointestinal: No blood in stool or  hematemesis Genitourinary: No dysuria or hematuria. Psychiatric:: No history of major depression. Integumentary: No rashes or ulcers. Constitutional: No fever or chills.  PHYSICAL EXAMINATION: General: The patient appears their stated age.  Vital signs are BP 146/79 mmHg  Pulse 98  Ht 5\' 11"  (1.803 m)  Wt 206 lb (93.441 kg)  BMI 28.74 kg/m2  SpO2 97% HEENT:  No gross abnormalities Pulmonary: Respirations are non-labored  Abdomen: Soft and non-tender .  Aorta is not palpable Musculoskeletal: There are no major deformities.   Neurologic: No focal weakness or paresthesias are detected, Skin: There are no ulcer or rashes noted. Psychiatric: The patient has normal affect. Cardiovascular: There is a regular rate and rhythm without significant murmur appreciated.palpable pedal pulses.  No carotid bruit  Diagnostic Studies: I have reviewed his CT scan which shows a radiographic string sign on the left.  There also appears to be soft plaque and ulceration at the bifurcation   Assessment:  Asymptomatic left carotid stenosis Plan: After reviewing the patient's CT scan and seeing the ulcer with thrombus burden and soft plaque, I do not feel that he would be a good candidate for the CREST II trial, but rather would do better with intervention.  We have decided to proceed with left carotid endarterectomy.  I discussed the risks and benefits including the risk of nerve injury stroke and bleeding.  All of his questions have been answered and he wishes to proceed.  His operation has been scheduled for Friday, February 12.  I have also encouraged the patient to restart taking his statin medication as well as his baby aspirin.  He has agreed to do so.     Eldridge Abrahams, M.D. [facility] Office: [phone] Pager:  [phone]

## 2014-06-03 ENCOUNTER — Other Ambulatory Visit: Payer: Medicare HMO

## 2014-06-03 DIAGNOSIS — I10 Essential (primary) hypertension: Secondary | ICD-10-CM

## 2014-06-03 DIAGNOSIS — E785 Hyperlipidemia, unspecified: Secondary | ICD-10-CM | POA: Diagnosis not present

## 2014-06-04 LAB — BASIC METABOLIC PANEL
BUN/Creatinine Ratio: 13 (ref 10–22)
BUN: 12 mg/dL (ref 8–27)
CHLORIDE: 101 mmol/L (ref 97–108)
CO2: 24 mmol/L (ref 18–29)
CREATININE: 0.94 mg/dL (ref 0.76–1.27)
Calcium: 8.9 mg/dL (ref 8.6–10.2)
GFR calc non Af Amer: 76 mL/min/{1.73_m2} (ref >59)
GFR, EST AFRICAN AMERICAN: 88 mL/min/{1.73_m2} (ref >59)
Glucose: 91 mg/dL (ref 65–99)
Potassium: 4.1 mmol/L (ref 3.5–5.2)
Sodium: 138 mmol/L (ref 134–144)

## 2014-06-04 LAB — LIPID PANEL
Chol/HDL Ratio: 3.3 ratio units (ref 0.0–5.0)
Cholesterol, Total: 144 mg/dL (ref 100–199)
HDL: 44 mg/dL (ref >39)
LDL Calculated: 84 mg/dL (ref 0–99)
Triglycerides: 78 mg/dL (ref 0–149)
VLDL CHOLESTEROL CAL: 16 mg/dL (ref 5–40)

## 2014-06-05 ENCOUNTER — Encounter: Payer: Self-pay | Admitting: Internal Medicine

## 2014-06-05 ENCOUNTER — Ambulatory Visit (INDEPENDENT_AMBULATORY_CARE_PROVIDER_SITE_OTHER): Payer: Medicare HMO | Admitting: Internal Medicine

## 2014-06-05 VITALS — BP 120/70 | HR 76 | Temp 98.3°F | Resp 20 | Ht 71.0 in | Wt 203.0 lb

## 2014-06-05 DIAGNOSIS — I10 Essential (primary) hypertension: Secondary | ICD-10-CM

## 2014-06-05 DIAGNOSIS — M25561 Pain in right knee: Secondary | ICD-10-CM | POA: Diagnosis not present

## 2014-06-05 DIAGNOSIS — L711 Rhinophyma: Secondary | ICD-10-CM | POA: Insufficient documentation

## 2014-06-05 DIAGNOSIS — M25562 Pain in left knee: Secondary | ICD-10-CM

## 2014-06-05 DIAGNOSIS — E785 Hyperlipidemia, unspecified: Secondary | ICD-10-CM | POA: Diagnosis not present

## 2014-06-05 DIAGNOSIS — I739 Peripheral vascular disease, unspecified: Principal | ICD-10-CM

## 2014-06-05 DIAGNOSIS — I779 Disorder of arteries and arterioles, unspecified: Secondary | ICD-10-CM

## 2014-06-05 HISTORY — DX: Rhinophyma: L71.1

## 2014-06-05 NOTE — Progress Notes (Addendum)
Patient ID: Alex [name], male   DOB: [birth year], 81 y.o.   MRN: [id number]    Facility  [facility]    Place of Service:   OFFICE   No Known Allergies  Chief Complaint  Patient presents with  . Medical Management of Chronic Issues    HPI:  Left-sided carotid artery disease: 95% blockage on the left carotid artery. Has seen Dr. Harold Barban. Scheduled for surgery 06/14/14.  Essential hypertension: Controlled  Hyperlipidemia: Substantial improvement with lowering of total cholesterol and LDL.   Bilateral knee pain: Patient's knee pains have improved he is not sure whether it is because he started drinking beer or wine but he says his legs feel stronger and he can get up and down much easier.   Rhinophyma: Incidental finding     Medications: Patient's Medications  New Prescriptions   No medications on file  Previous Medications   ADVAIR DISKUS 250-50 MCG/DOSE AEPB    inhale 1 puff by mouth twice a day   ASPIRIN 81 MG TABLET    Take 81 mg by mouth daily. Take one tablet once a day   ATORVASTATIN (LIPITOR) 10 MG TABLET    Take 10 mg by mouth daily.   B COMPLEX VITAMINS TABLET    Take 1 tablet by mouth daily.   CHOLECALCIFEROL (VITAMIN D) 1000 UNITS TABLET    Take 1,000 Units by mouth daily.    CINNAMON 500 MG TABS    Take 1 tablet by mouth daily.   CITRUS BERGAMOT PO    Take 1 capsule by mouth daily. Take one capsule once daily to lower cholesterol   COENZYME Q10 (CO Q 10) 100 MG CAPS    Take 1 capsule by mouth daily.   DIAZEPAM (VALIUM) 5 MG TABLET    take 1/2 tablet by mouth UP TO TWICE A DAY AS NEEDED FOR DIZZINESS   DOXEPIN (SINEQUAN) 25 MG CAPSULE    Take 25 mg by mouth at bedtime.    FISH OIL-OMEGA-3 FATTY ACIDS 1000 MG CAPSULE    Take 2 g by mouth daily. Take one tablet once a day for cholesterol   IBUPROFEN (ADVIL,MOTRIN) 200 MG TABLET    Take 200 mg by mouth every 6 (six) hours as needed for pain. Take 3-4 tablets twice a day for neck pain as needed   LORAZEPAM (ATIVAN) 1 MG  TABLET    Take one tablet by mouth at bedtime as needed for rest   NUTRITIONAL SUPPLEMENTS (NUTRITIONAL SUPPLEMENT PO)    Synerflex. Take 2 tablets by mouth daily.   NUTRITIONAL SUPPLEMENTS (NUTRITIONAL SUPPLEMENT PO)    Ginger and Turmeric. Take 1 capsule by mouth daily.   POTASSIUM 99 MG TABS    Take 1 tablet by mouth daily. Take one tablet once a day for cramps   RAMIPRIL (ALTACE) 5 MG CAPSULE    Take 1 capsule (5 mg total) by mouth daily.   RESVERATROL 100 MG CAPS    Take 1 capsule by mouth daily. Take one tablet once a day  Modified Medications   No medications on file  Discontinued Medications   No medications on file     Review of Systems  Constitutional: Negative for fever, activity change, appetite change, fatigue and unexpected weight change.  HENT: Positive for hearing loss. Negative for ear discharge.   Eyes: Negative.   Respiratory: Negative.  Negative for shortness of breath.   Cardiovascular: Positive for leg swelling. Negative for chest pain.  Left carotid stenosis  Gastrointestinal:       Occasional heartburn which seems well controlled. History of hemorrhoids.  Endocrine: Negative.   Genitourinary: Negative.        History of kidney stones. Nocturia.  Musculoskeletal: Positive for back pain and arthralgias. Negative for neck pain and neck stiffness.       Leg cramps in the past  Skin: Negative.   Neurological:       Episodes of dizziness. None recent. Had an episode of syncope after severe coughing.  Hematological: Negative.   Psychiatric/Behavioral: Negative.        Mild insomnia.    Filed Vitals:   06/05/14 1203  BP: 120/70  Pulse: 76  Temp: 98.3 F (36.8 C)  TempSrc: Oral  Resp: 20  Height: 5\' 11"  (1.803 m)  Weight: 203 lb (92.08 kg)  SpO2: 98%   Body mass index is 28.33 kg/(m^2).  Physical Exam  Constitutional: He is oriented to person, place, and time.  Overweight.  HENT:  Mild loss of hearing bilaterally. Bilateral cerumen  impaction. 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  Eyes:  Corrective lenses.  Neck: No JVD present. No tracheal deviation present. No thyromegaly present.  Cardiovascular: Normal rate, regular rhythm, normal heart sounds and intact distal pulses.  Exam reveals no gallop and no friction rub.   No murmur heard. Diminished pulse left carotid artery.  Pulmonary/Chest: Breath sounds normal. No respiratory distress. He has no wheezes. He has no rales. He exhibits no tenderness.  Abdominal: Soft. Bowel sounds are normal. He exhibits no distension and no mass. There is no tenderness.  Musculoskeletal: Normal range of motion. He exhibits no edema or tenderness.  Lymphadenopathy:    He has no cervical adenopathy.  Neurological: He is alert and oriented to person, place, and time. He has normal reflexes. No cranial nerve deficit. Coordination normal.  Skin: Skin is warm and dry. No rash noted. No erythema. No pallor.  Psychiatric: He has a normal mood and affect. His behavior is normal. Judgment and thought content normal.     Labs reviewed: Appointment on 06/03/2014  Component Date Value Ref Range Status  . Glucose 06/03/2014 91  65 - 99 mg/dL Final  . BUN 06/03/2014 12  8 - 27 mg/dL Final  . Creatinine, Ser 06/03/2014 0.94  0.76 - 1.27 mg/dL Final  . GFR calc non Af Amer 06/03/2014 76  >59 mL/min/1.73 Final  . GFR calc Af Amer 06/03/2014 88  >59 mL/min/1.73 Final  . BUN/Creatinine Ratio 06/03/2014 13  10 - 22 Final  . Sodium 06/03/2014 138  134 - 144 mmol/L Final  . Potassium 06/03/2014 4.1  3.5 - 5.2 mmol/L Final  . Chloride 06/03/2014 101  97 - 108 mmol/L Final  . CO2 06/03/2014 24  18 - 29 mmol/L Final  . Calcium 06/03/2014 8.9  8.6 - 10.2 mg/dL Final  . Cholesterol, Total 06/03/2014 144  100 - 199 mg/dL Final  . Triglycerides 06/03/2014 78  0 - 149 mg/dL Final  . HDL 06/03/2014 44  >39 mg/dL Final   Comment: According to ATP-III Guidelines, HDL-C >59 mg/dL is considered  a negative risk factor for CHD.   Marland Kitchen VLDL Cholesterol Cal 06/03/2014 16  5 - 40 mg/dL Final  . LDL Calculated 06/03/2014 84  0 - 99 mg/dL Final  . Chol/HDL Ratio 06/03/2014 3.3  0.0 - 5.0 ratio units Final   Comment:  T. Chol/HDL Ratio                                             Men  Women                               1/2 Avg.Risk  3.4    3.3                                   Avg.Risk  5.0    4.4                                2X Avg.Risk  9.6    7.1                                3X Avg.Risk 23.4   11.0   Office Visit on 05/15/2014  Component Date Value Ref Range Status  . BUN/Creatinine Ratio 05/15/2014 12.8   Final  Orders Only on 05/15/2014  Component Date Value Ref Range Status  . BUN 05/15/2014 11  6 - 23 mg/dL Final  . Creat 05/15/2014 0.86  0.50 - 1.35 mg/dL Final     Assessment/Plan   1. Left-sided carotid artery disease Proceed with surgery as scheduled  2. Essential hypertension - Basic metabolic panel; Future  3. Hyperlipidemia - Lipid panel; Future  4. Bilateral knee pain Improved  5. Rhinophyma Observe

## 2014-06-06 ENCOUNTER — Encounter (HOSPITAL_COMMUNITY): Payer: Self-pay

## 2014-06-06 ENCOUNTER — Encounter (HOSPITAL_COMMUNITY)
Admission: RE | Admit: 2014-06-06 | Discharge: 2014-06-06 | Disposition: A | Discharge status: Home / Self Care | Payer: Medicare HMO | Source: Ambulatory Visit | Attending: Surgery | Admitting: Surgery

## 2014-06-06 DIAGNOSIS — I10 Essential (primary) hypertension: Secondary | ICD-10-CM | POA: Insufficient documentation

## 2014-06-06 DIAGNOSIS — Z01812 Encounter for preprocedural laboratory examination: Secondary | ICD-10-CM | POA: Insufficient documentation

## 2014-06-06 DIAGNOSIS — I6522 Occlusion and stenosis of left carotid artery: Secondary | ICD-10-CM | POA: Diagnosis not present

## 2014-06-06 DIAGNOSIS — Z79899 Other long term (current) drug therapy: Secondary | ICD-10-CM | POA: Diagnosis not present

## 2014-06-06 DIAGNOSIS — Z0183 Encounter for blood typing: Secondary | ICD-10-CM | POA: Diagnosis not present

## 2014-06-06 DIAGNOSIS — E785 Hyperlipidemia, unspecified: Secondary | ICD-10-CM | POA: Insufficient documentation

## 2014-06-06 DIAGNOSIS — Z7982 Long term (current) use of aspirin: Secondary | ICD-10-CM | POA: Insufficient documentation

## 2014-06-06 LAB — ABO/RH: ABO/RH(D): A POS

## 2014-06-06 LAB — COMPREHENSIVE METABOLIC PANEL
ALK PHOS: 49 U/L (ref 39–117)
ALT: 16 U/L (ref 0–53)
AST: 22 U/L (ref 0–37)
Albumin: 3.9 g/dL (ref 3.5–5.2)
Anion gap: 6 (ref 5–15)
BILIRUBIN TOTAL: 0.7 mg/dL (ref 0.3–1.2)
BUN: 10 mg/dL (ref 6–23)
CALCIUM: 9.3 mg/dL (ref 8.4–10.5)
CHLORIDE: 106 mmol/L (ref 96–112)
CO2: 26 mmol/L (ref 19–32)
CREATININE: 1 mg/dL (ref 0.50–1.35)
GFR calc Af Amer: 80 mL/min — ABNORMAL LOW (ref >90)
GFR calc non Af Amer: 69 mL/min — ABNORMAL LOW (ref >90)
Glucose, Bld: 99 mg/dL (ref 70–99)
POTASSIUM: 4.1 mmol/L (ref 3.5–5.1)
SODIUM: 138 mmol/L (ref 135–145)
Total Protein: 6.8 g/dL (ref 6.0–8.3)

## 2014-06-06 LAB — CBC
HCT: 43 % (ref 39.0–52.0)
Hemoglobin: 14.8 g/dL (ref 13.0–17.0)
MCH: 32.5 pg (ref 26.0–34.0)
MCHC: 34.4 g/dL (ref 30.0–36.0)
MCV: 94.5 fL (ref 78.0–100.0)
Platelets: 199 10*3/uL (ref 150–400)
RBC: 4.55 MIL/uL (ref 4.22–5.81)
RDW: 13.1 % (ref 11.5–15.5)
WBC: 7.6 10*3/uL (ref 4.0–10.5)

## 2014-06-06 LAB — URINALYSIS, ROUTINE W REFLEX MICROSCOPIC
BILIRUBIN URINE: NEGATIVE
Glucose, UA: NEGATIVE mg/dL
Ketones, ur: NEGATIVE mg/dL
LEUKOCYTES UA: NEGATIVE
NITRITE: NEGATIVE
PH: 6.5 (ref 5.0–8.0)
Protein, ur: NEGATIVE mg/dL
Specific Gravity, Urine: 1.01 (ref 1.005–1.030)
Urobilinogen, UA: 0.2 mg/dL (ref 0.0–1.0)

## 2014-06-06 LAB — URINE MICROSCOPIC-ADD ON

## 2014-06-06 LAB — PROTIME-INR
INR: 1.03 (ref 0.00–1.49)
PROTHROMBIN TIME: 13.6 s (ref 11.6–15.2)

## 2014-06-06 LAB — SURGICAL PCR SCREEN
MRSA, PCR: NEGATIVE
Staphylococcus aureus: NEGATIVE

## 2014-06-06 LAB — TYPE AND SCREEN
ABO/RH(D): A POS
ANTIBODY SCREEN: NEGATIVE

## 2014-06-06 LAB — APTT: APTT: 30 s (ref 24–37)

## 2014-06-06 MED ORDER — CHLORHEXIDINE GLUCONATE CLOTH 2 % EX PADS: 6.0000 | MEDICATED_PAD | Freq: Once | CUTANEOUS | Status: DC

## 2014-06-06 NOTE — Pre-Procedure Instructions (Signed)
IBRAHAM LEVI  06/06/2014   Your procedure is scheduled on:  06/14/14  Report to [facility] Admitting at 530 AM.  Call this number if you have problems the morning of surgery: [phone]   Remember:   Do not eat food or drink liquids after midnight.   Take these medicines the morning of surgery with A SIP OF WATER: all inhalers,valium   Do not wear jewelry, make-up or nail polish.  Do not wear lotions, powders, or perfumes. You may wear deodorant.  Do not shave 48 hours prior to surgery. Men may shave face and neck.  Do not bring valuables to the hospital.  [facility] is not responsible                  for any belongings or valuables.               Contacts, dentures or bridgework may not be worn into surgery.  Leave suitcase in the car. After surgery it may be brought to your room.  For patients admitted to the hospital, discharge time is determined by your                treatment team.               Patients discharged the day of surgery will not be allowed to drive  home.  Name and phone number of your driver: family  Special Instructions: Shower using CHG 2 nights before surgery and the night before surgery.  If you shower the day of surgery use CHG.  Use special wash - you have one bottle of CHG for all showers.  You should use approximately 1/3 of the bottle for each shower.   Please read over the following fact sheets that you were given: Pain Booklet, Coughing and Deep Breathing, Blood Transfusion Information, MRSA Information and Surgical Site Infection Prevention

## 2014-06-13 MED ORDER — DEXTROSE 5 % IV SOLN
1.5000 g | INTRAVENOUS | Status: DC
  Filled 2014-06-13: qty 1.5

## 2014-06-13 MED ORDER — SODIUM CHLORIDE 0.9 % IV SOLN: INTRAVENOUS | Status: DC

## 2014-06-13 NOTE — Progress Notes (Signed)
Nurse called patient and instructed him to arrive at 0830 instead of 0530. Patient was very pleased about not coming in at 0530 and verbalized understanding.

## 2014-06-14 ENCOUNTER — Inpatient Hospital Stay (HOSPITAL_COMMUNITY): Payer: Commercial Managed Care - HMO | Admitting: Anesthesiology

## 2014-06-14 ENCOUNTER — Encounter (HOSPITAL_COMMUNITY)
Admission: RE | Disposition: A | Discharge status: Home / Self Care | Payer: Self-pay | Source: Ambulatory Visit | Attending: Surgery

## 2014-06-14 ENCOUNTER — Encounter (HOSPITAL_COMMUNITY): Payer: Self-pay | Admitting: *Deleted

## 2014-06-14 ENCOUNTER — Inpatient Hospital Stay (HOSPITAL_COMMUNITY)
Admission: RE | Admit: 2014-06-14 | Discharge: 2014-06-15 | DRG: 038 | Disposition: A | Discharge status: Home / Self Care | Payer: Commercial Managed Care - HMO | Source: Ambulatory Visit | Attending: Surgery | Admitting: Surgery

## 2014-06-14 DIAGNOSIS — Z87891 Personal history of nicotine dependence: Secondary | ICD-10-CM

## 2014-06-14 DIAGNOSIS — T466X6A Underdosing of antihyperlipidemic and antiarteriosclerotic drugs, initial encounter: Secondary | ICD-10-CM | POA: Diagnosis present

## 2014-06-14 DIAGNOSIS — I10 Essential (primary) hypertension: Secondary | ICD-10-CM | POA: Diagnosis present

## 2014-06-14 DIAGNOSIS — I2510 Atherosclerotic heart disease of native coronary artery without angina pectoris: Secondary | ICD-10-CM | POA: Diagnosis not present

## 2014-06-14 DIAGNOSIS — R339 Retention of urine, unspecified: Secondary | ICD-10-CM | POA: Diagnosis not present

## 2014-06-14 DIAGNOSIS — Z7982 Long term (current) use of aspirin: Secondary | ICD-10-CM

## 2014-06-14 DIAGNOSIS — Z79899 Other long term (current) drug therapy: Secondary | ICD-10-CM | POA: Diagnosis not present

## 2014-06-14 DIAGNOSIS — I6522 Occlusion and stenosis of left carotid artery: Secondary | ICD-10-CM | POA: Diagnosis not present

## 2014-06-14 DIAGNOSIS — Z9114 Patient's other noncompliance with medication regimen: Secondary | ICD-10-CM | POA: Diagnosis present

## 2014-06-14 DIAGNOSIS — N401 Enlarged prostate with lower urinary tract symptoms: Secondary | ICD-10-CM | POA: Diagnosis not present

## 2014-06-14 DIAGNOSIS — E780 Pure hypercholesterolemia: Secondary | ICD-10-CM | POA: Diagnosis not present

## 2014-06-14 DIAGNOSIS — Z7951 Long term (current) use of inhaled steroids: Secondary | ICD-10-CM

## 2014-06-14 DIAGNOSIS — Z951 Presence of aortocoronary bypass graft: Secondary | ICD-10-CM | POA: Diagnosis not present

## 2014-06-14 DIAGNOSIS — I252 Old myocardial infarction: Secondary | ICD-10-CM

## 2014-06-14 DIAGNOSIS — E785 Hyperlipidemia, unspecified: Secondary | ICD-10-CM | POA: Diagnosis present

## 2014-06-14 DIAGNOSIS — E559 Vitamin D deficiency, unspecified: Secondary | ICD-10-CM | POA: Diagnosis present

## 2014-06-14 DIAGNOSIS — N138 Other obstructive and reflux uropathy: Secondary | ICD-10-CM | POA: Diagnosis present

## 2014-06-14 DIAGNOSIS — T39016A Underdosing of aspirin, initial encounter: Secondary | ICD-10-CM | POA: Diagnosis present

## 2014-06-14 DIAGNOSIS — R0602 Shortness of breath: Secondary | ICD-10-CM | POA: Diagnosis not present

## 2014-06-14 DIAGNOSIS — I6529 Occlusion and stenosis of unspecified carotid artery: Secondary | ICD-10-CM | POA: Diagnosis present

## 2014-06-14 HISTORY — PX: ENDARTERECTOMY: SHX5162

## 2014-06-14 HISTORY — PX: CAROTID ENDARTERECTOMY: SUR193

## 2014-06-14 LAB — CBC
HCT: 38 % — ABNORMAL LOW (ref 39.0–52.0)
Hemoglobin: 12.6 g/dL — ABNORMAL LOW (ref 13.0–17.0)
MCH: 32.5 pg (ref 26.0–34.0)
MCHC: 33.2 g/dL (ref 30.0–36.0)
MCV: 97.9 fL (ref 78.0–100.0)
Platelets: 172 10*3/uL (ref 150–400)
RBC: 3.88 MIL/uL — ABNORMAL LOW (ref 4.22–5.81)
RDW: 13.6 % (ref 11.5–15.5)
WBC: 9.8 10*3/uL (ref 4.0–10.5)

## 2014-06-14 LAB — CREATININE, SERUM
Creatinine, Ser: 0.98 mg/dL (ref 0.50–1.35)
GFR calc Af Amer: 88 mL/min — ABNORMAL LOW (ref >90)
GFR calc non Af Amer: 76 mL/min — ABNORMAL LOW (ref >90)

## 2014-06-14 SURGERY — ENDARTERECTOMY, CAROTID
Anesthesia: General | Site: Neck | Laterality: Left

## 2014-06-14 MED ORDER — EPHEDRINE SULFATE 50 MG/ML IJ SOLN
INTRAMUSCULAR | Status: DC | PRN
  Administered 2014-06-14: 10 mg via INTRAVENOUS
  Administered 2014-06-14 (×2): 2.5 mg via INTRAVENOUS
  Administered 2014-06-14: 7.5 mg via INTRAVENOUS
  Administered 2014-06-14: 5 mg via INTRAVENOUS
  Administered 2014-06-14: 2.5 mg via INTRAVENOUS

## 2014-06-14 MED ORDER — 0.9 % SODIUM CHLORIDE (POUR BTL) OPTIME
TOPICAL | Status: DC | PRN
  Administered 2014-06-14: 1000 mL

## 2014-06-14 MED ORDER — ALBUMIN HUMAN 5 % IV SOLN
12.5000 g | Freq: Once | INTRAVENOUS | Status: AC
  Administered 2014-06-14: 12.5 g via INTRAVENOUS

## 2014-06-14 MED ORDER — SODIUM CHLORIDE 0.9 % IJ SOLN
INTRAMUSCULAR | Status: AC
  Filled 2014-06-14: qty 10

## 2014-06-14 MED ORDER — LIDOCAINE HCL (PF) 1 % IJ SOLN
INTRAMUSCULAR | Status: AC
  Filled 2014-06-14: qty 30

## 2014-06-14 MED ORDER — LIDOCAINE HCL (CARDIAC) 20 MG/ML IV SOLN
INTRAVENOUS | Status: AC
  Filled 2014-06-14: qty 5

## 2014-06-14 MED ORDER — LIDOCAINE HCL (CARDIAC) 20 MG/ML IV SOLN
INTRAVENOUS | Status: DC | PRN
  Administered 2014-06-14: 70 mg via INTRAVENOUS

## 2014-06-14 MED ORDER — ASPIRIN 81 MG PO CHEW
81.0000 mg | CHEWABLE_TABLET | Freq: Every day | ORAL | Status: DC
  Administered 2014-06-14 – 2014-06-15 (×2): 81 mg via ORAL
  Filled 2014-06-14 (×2): qty 1

## 2014-06-14 MED ORDER — METOPROLOL TARTRATE 1 MG/ML IV SOLN
2.0000 mg | INTRAVENOUS | Status: DC | PRN
Start: 2014-06-14 — End: 2014-06-16

## 2014-06-14 MED ORDER — SUCCINYLCHOLINE CHLORIDE 20 MG/ML IJ SOLN
INTRAMUSCULAR | Status: AC
  Filled 2014-06-14: qty 1

## 2014-06-14 MED ORDER — OXYCODONE-ACETAMINOPHEN 5-325 MG PO TABS: 1.0000 | ORAL_TABLET | Freq: Four times a day (QID) | ORAL | Status: DC | PRN

## 2014-06-14 MED ORDER — DOXEPIN HCL 25 MG PO CAPS
25.0000 mg | ORAL_CAPSULE | Freq: Every day | ORAL | Status: DC
  Administered 2014-06-14: 25 mg via ORAL
  Filled 2014-06-14 (×2): qty 1

## 2014-06-14 MED ORDER — LACTATED RINGERS IV SOLN
INTRAVENOUS | Status: DC | PRN
  Administered 2014-06-14: 10:00:00 via INTRAVENOUS

## 2014-06-14 MED ORDER — ROCURONIUM BROMIDE 50 MG/5ML IV SOLN
INTRAVENOUS | Status: AC
  Filled 2014-06-14: qty 1

## 2014-06-14 MED ORDER — POTASSIUM 99 MG PO TABS
1.0000 | ORAL_TABLET | Freq: Every day | ORAL | Status: DC
Start: 2014-06-14 — End: 2014-06-14

## 2014-06-14 MED ORDER — CETYLPYRIDINIUM CHLORIDE 0.05 % MT LIQD
7.0000 mL | Freq: Two times a day (BID) | OROMUCOSAL | Status: DC
  Administered 2014-06-15: 7 mL via OROMUCOSAL

## 2014-06-14 MED ORDER — MORPHINE SULFATE 2 MG/ML IJ SOLN: 2.0000 mg | INTRAMUSCULAR | Status: DC | PRN

## 2014-06-14 MED ORDER — LORAZEPAM 1 MG PO TABS
1.0000 mg | ORAL_TABLET | Freq: Every day | ORAL | Status: DC
  Administered 2014-06-14: 1 mg via ORAL
  Filled 2014-06-14: qty 1

## 2014-06-14 MED ORDER — DEXTROSE 5 % IV SOLN
1.5000 g | Freq: Two times a day (BID) | INTRAVENOUS | Status: AC
  Administered 2014-06-14 – 2014-06-15 (×2): 1.5 g via INTRAVENOUS
  Filled 2014-06-14 (×2): qty 1.5

## 2014-06-14 MED ORDER — HEPARIN SODIUM (PORCINE) 1000 UNIT/ML IJ SOLN
INTRAMUSCULAR | Status: DC | PRN
  Administered 2014-06-14: 1000 [IU] via INTRAVENOUS
  Administered 2014-06-14: 9000 [IU] via INTRAVENOUS

## 2014-06-14 MED ORDER — SODIUM CHLORIDE 0.9 % IV SOLN
INTRAVENOUS | Status: DC
  Administered 2014-06-14 (×2): via INTRAVENOUS

## 2014-06-14 MED ORDER — RAMIPRIL 5 MG PO CAPS
5.0000 mg | ORAL_CAPSULE | Freq: Every day | ORAL | Status: DC
  Administered 2014-06-15: 5 mg via ORAL
  Filled 2014-06-14 (×2): qty 1

## 2014-06-14 MED ORDER — NEOSTIGMINE METHYLSULFATE 10 MG/10ML IV SOLN
INTRAVENOUS | Status: DC | PRN
  Administered 2014-06-14: 3 mg via INTRAVENOUS

## 2014-06-14 MED ORDER — ENOXAPARIN SODIUM 40 MG/0.4ML ~~LOC~~ SOLN
40.0000 mg | SUBCUTANEOUS | Status: DC
  Administered 2014-06-15: 40 mg via SUBCUTANEOUS
  Filled 2014-06-14 (×2): qty 0.4

## 2014-06-14 MED ORDER — SODIUM CHLORIDE 0.9 % IV SOLN: 2000.0000 ug | INTRAVENOUS | Status: DC | PRN

## 2014-06-14 MED ORDER — PROTAMINE SULFATE 10 MG/ML IV SOLN
INTRAVENOUS | Status: DC | PRN
  Administered 2014-06-14: 10 mg via INTRAVENOUS
  Administered 2014-06-14: 20 mg via INTRAVENOUS
  Administered 2014-06-14 (×2): 10 mg via INTRAVENOUS

## 2014-06-14 MED ORDER — MOMETASONE FURO-FORMOTEROL FUM 100-5 MCG/ACT IN AERO
2.0000 | INHALATION_SPRAY | Freq: Two times a day (BID) | RESPIRATORY_TRACT | Status: DC
  Administered 2014-06-14: 2 via RESPIRATORY_TRACT
  Filled 2014-06-14: qty 8.8

## 2014-06-14 MED ORDER — PANTOPRAZOLE SODIUM 40 MG PO TBEC
40.0000 mg | DELAYED_RELEASE_TABLET | Freq: Every day | ORAL | Status: DC
Start: 2014-06-14 — End: 2014-06-16
  Administered 2014-06-15: 40 mg via ORAL
  Filled 2014-06-14 (×2): qty 1

## 2014-06-14 MED ORDER — RESVERATROL 100 MG PO CAPS
1.0000 | ORAL_CAPSULE | Freq: Every day | ORAL | Status: DC
  Filled 2014-06-14 (×2): qty 1

## 2014-06-14 MED ORDER — POTASSIUM CHLORIDE CRYS ER 20 MEQ PO TBCR: 20.0000 meq | EXTENDED_RELEASE_TABLET | Freq: Every day | ORAL | Status: DC | PRN

## 2014-06-14 MED ORDER — BISACODYL 5 MG PO TBEC: 5.0000 mg | DELAYED_RELEASE_TABLET | Freq: Every day | ORAL | Status: DC | PRN

## 2014-06-14 MED ORDER — ACETAMINOPHEN 160 MG/5ML PO SOLN: 325.0000 mg | ORAL | Status: DC | PRN

## 2014-06-14 MED ORDER — MAGNESIUM SULFATE 2 GM/50ML IV SOLN: 2.0000 g | Freq: Every day | INTRAVENOUS | Status: DC | PRN

## 2014-06-14 MED ORDER — VITAMIN D3 25 MCG (1000 UNIT) PO TABS
1000.0000 [IU] | ORAL_TABLET | Freq: Every day | ORAL | Status: DC
  Administered 2014-06-14 – 2014-06-15 (×2): 1000 [IU] via ORAL
  Filled 2014-06-14 (×2): qty 1

## 2014-06-14 MED ORDER — HEPARIN SODIUM (PORCINE) 1000 UNIT/ML IJ SOLN
INTRAMUSCULAR | Status: AC
  Filled 2014-06-14: qty 1

## 2014-06-14 MED ORDER — DIAZEPAM 5 MG PO TABS
5.0000 mg | ORAL_TABLET | Freq: Three times a day (TID) | ORAL | Status: DC | PRN
  Administered 2014-06-15: 2.5 mg via ORAL
  Filled 2014-06-14 (×2): qty 1

## 2014-06-14 MED ORDER — LACTATED RINGERS IV SOLN
Freq: Once | INTRAVENOUS | Status: AC
  Administered 2014-06-14: 09:00:00 via INTRAVENOUS

## 2014-06-14 MED ORDER — DEXTROSE 5 % IV SOLN
10.0000 mg | INTRAVENOUS | Status: DC | PRN
Start: 2014-06-14 — End: 2014-06-14
  Administered 2014-06-14: 20 ug/min via INTRAVENOUS

## 2014-06-14 MED ORDER — PROPOFOL 10 MG/ML IV BOLUS
INTRAVENOUS | Status: DC | PRN
  Administered 2014-06-14: 130 mg via INTRAVENOUS

## 2014-06-14 MED ORDER — FENTANYL CITRATE 0.05 MG/ML IJ SOLN: 25.0000 ug | INTRAMUSCULAR | Status: DC | PRN

## 2014-06-14 MED ORDER — GUAIFENESIN-DM 100-10 MG/5ML PO SYRP: 15.0000 mL | ORAL_SOLUTION | ORAL | Status: DC | PRN

## 2014-06-14 MED ORDER — OXYCODONE HCL 5 MG/5ML PO SOLN: 5.0000 mg | Freq: Once | ORAL | Status: DC | PRN

## 2014-06-14 MED ORDER — ATORVASTATIN CALCIUM 10 MG PO TABS
10.0000 mg | ORAL_TABLET | Freq: Every day | ORAL | Status: DC
  Administered 2014-06-14 – 2014-06-15 (×2): 10 mg via ORAL
  Filled 2014-06-14 (×2): qty 1

## 2014-06-14 MED ORDER — ONDANSETRON HCL 4 MG/2ML IJ SOLN
INTRAMUSCULAR | Status: AC
  Filled 2014-06-14: qty 2

## 2014-06-14 MED ORDER — LIDOCAINE HCL (CARDIAC) 20 MG/ML IV SOLN
INTRAVENOUS | Status: AC
  Filled 2014-06-14: qty 10

## 2014-06-14 MED ORDER — EPHEDRINE SULFATE 50 MG/ML IJ SOLN
INTRAMUSCULAR | Status: AC
  Filled 2014-06-14: qty 1

## 2014-06-14 MED ORDER — ONDANSETRON HCL 4 MG/2ML IJ SOLN: 4.0000 mg | Freq: Four times a day (QID) | INTRAMUSCULAR | Status: DC | PRN

## 2014-06-14 MED ORDER — FENTANYL CITRATE 0.05 MG/ML IJ SOLN
INTRAMUSCULAR | Status: AC
  Filled 2014-06-14: qty 5

## 2014-06-14 MED ORDER — SENNOSIDES-DOCUSATE SODIUM 8.6-50 MG PO TABS
1.0000 | ORAL_TABLET | Freq: Every evening | ORAL | Status: DC | PRN
  Filled 2014-06-14: qty 1

## 2014-06-14 MED ORDER — PHENYLEPHRINE HCL 10 MG/ML IJ SOLN
INTRAMUSCULAR | Status: DC | PRN
  Administered 2014-06-14: 80 ug via INTRAVENOUS

## 2014-06-14 MED ORDER — SODIUM CHLORIDE 0.9 % IV SOLN
0.0125 ug/kg/min | INTRAVENOUS | Status: AC
  Administered 2014-06-14: .2 ug/kg/min via INTRAVENOUS
  Filled 2014-06-14: qty 2000

## 2014-06-14 MED ORDER — ALUM & MAG HYDROXIDE-SIMETH 200-200-20 MG/5ML PO SUSP: 15.0000 mL | ORAL | Status: DC | PRN

## 2014-06-14 MED ORDER — DEXTROSE 5 % IV SOLN
1.5000 g | INTRAVENOUS | Status: DC | PRN
  Administered 2014-06-14: 1.5 g via INTRAVENOUS

## 2014-06-14 MED ORDER — ONDANSETRON HCL 4 MG/2ML IJ SOLN
INTRAMUSCULAR | Status: DC | PRN
  Administered 2014-06-14: 4 mg via INTRAVENOUS

## 2014-06-14 MED ORDER — FENTANYL CITRATE 0.05 MG/ML IJ SOLN
INTRAMUSCULAR | Status: DC | PRN
  Administered 2014-06-14: 100 ug via INTRAVENOUS

## 2014-06-14 MED ORDER — LABETALOL HCL 5 MG/ML IV SOLN: 10.0000 mg | INTRAVENOUS | Status: DC | PRN

## 2014-06-14 MED ORDER — ASPIRIN 81 MG PO TABS: 81.0000 mg | ORAL_TABLET | Freq: Every day | ORAL | Status: DC

## 2014-06-14 MED ORDER — ALBUMIN HUMAN 5 % IV SOLN
INTRAVENOUS | Status: AC
  Filled 2014-06-14: qty 250

## 2014-06-14 MED ORDER — HYDRALAZINE HCL 20 MG/ML IJ SOLN: 5.0000 mg | INTRAMUSCULAR | Status: DC | PRN

## 2014-06-14 MED ORDER — PROTAMINE SULFATE 10 MG/ML IV SOLN
INTRAVENOUS | Status: AC
Start: 2014-06-14 — End: 2014-06-14
  Filled 2014-06-14: qty 5

## 2014-06-14 MED ORDER — DOCUSATE SODIUM 100 MG PO CAPS
100.0000 mg | ORAL_CAPSULE | Freq: Every day | ORAL | Status: DC
  Administered 2014-06-15: 100 mg via ORAL
  Filled 2014-06-14: qty 1

## 2014-06-14 MED ORDER — ACETAMINOPHEN 325 MG PO TABS: 325.0000 mg | ORAL_TABLET | ORAL | Status: DC | PRN

## 2014-06-14 MED ORDER — OXYCODONE-ACETAMINOPHEN 5-325 MG PO TABS
1.0000 | ORAL_TABLET | ORAL | Status: DC | PRN
  Administered 2014-06-15 (×3): 1 via ORAL
  Filled 2014-06-14 (×4): qty 1

## 2014-06-14 MED ORDER — HEPARIN SODIUM (PORCINE) 5000 UNIT/ML IJ SOLN
INTRAMUSCULAR | Status: DC | PRN
  Administered 2014-06-14: 500 mL

## 2014-06-14 MED ORDER — GLYCOPYRROLATE 0.2 MG/ML IJ SOLN
INTRAMUSCULAR | Status: DC | PRN
  Administered 2014-06-14 (×2): 0.2 mg via INTRAVENOUS
  Administered 2014-06-14: 0.4 mg via INTRAVENOUS

## 2014-06-14 MED ORDER — SODIUM CHLORIDE 0.9 % IV SOLN: 500.0000 mL | Freq: Once | INTRAVENOUS | Status: AC | PRN

## 2014-06-14 MED ORDER — ACETAMINOPHEN 650 MG RE SUPP: 325.0000 mg | RECTAL | Status: DC | PRN

## 2014-06-14 MED ORDER — ROCURONIUM BROMIDE 100 MG/10ML IV SOLN
INTRAVENOUS | Status: DC | PRN
  Administered 2014-06-14: 50 mg via INTRAVENOUS

## 2014-06-14 MED ORDER — PHENOL 1.4 % MT LIQD
1.0000 | OROMUCOSAL | Status: DC | PRN
  Filled 2014-06-14: qty 177

## 2014-06-14 MED ORDER — TAMSULOSIN HCL 0.4 MG PO CAPS
0.4000 mg | ORAL_CAPSULE | Freq: Every day | ORAL | Status: DC
  Administered 2014-06-14 – 2014-06-15 (×2): 0.4 mg via ORAL
  Filled 2014-06-14 (×3): qty 1

## 2014-06-14 MED ORDER — OXYCODONE HCL 5 MG PO TABS: 5.0000 mg | ORAL_TABLET | Freq: Once | ORAL | Status: DC | PRN

## 2014-06-14 SURGICAL SUPPLY — 49 items
BLADE SURG 10 STRL SS (BLADE) ×3 IMPLANT
CANISTER SUCTION 2500CC (MISCELLANEOUS) ×3 IMPLANT
CATH ROBINSON RED A/P 18FR (CATHETERS) ×3 IMPLANT
CATH SUCT 10FR WHISTLE TIP (CATHETERS) ×3 IMPLANT
CLIP TI MEDIUM 6 (CLIP) ×3 IMPLANT
CLIP TI WIDE RED SMALL 6 (CLIP) ×6 IMPLANT
CRADLE DONUT ADULT HEAD (MISCELLANEOUS) ×3 IMPLANT
DRAIN CHANNEL 15F RND FF W/TCR (WOUND CARE) IMPLANT
ELECT CAUTERY BLADE 6.4 (BLADE) ×3 IMPLANT
ELECT REM PT RETURN 9FT ADLT (ELECTROSURGICAL) ×3
ELECTRODE REM PT RTRN 9FT ADLT (ELECTROSURGICAL) ×1 IMPLANT
EVACUATOR SILICONE 100CC (DRAIN) IMPLANT
GAUZE SPONGE 4X4 12PLY STRL (GAUZE/BANDAGES/DRESSINGS) ×3 IMPLANT
GLOVE BIOGEL PI IND STRL 7.5 (GLOVE) ×3 IMPLANT
GLOVE BIOGEL PI INDICATOR 7.5 (GLOVE) ×6
GLOVE SS BIOGEL STRL SZ 7 (GLOVE) ×1 IMPLANT
GLOVE SUPERSENSE BIOGEL SZ 7 (GLOVE) ×2
GLOVE SURG SS PI 7.0 STRL IVOR (GLOVE) ×6 IMPLANT
GLOVE SURG SS PI 7.5 STRL IVOR (GLOVE) ×6 IMPLANT
GOWN STRL REUS W/ TWL LRG LVL3 (GOWN DISPOSABLE) ×2 IMPLANT
GOWN STRL REUS W/ TWL XL LVL3 (GOWN DISPOSABLE) ×2 IMPLANT
GOWN STRL REUS W/TWL LRG LVL3 (GOWN DISPOSABLE) ×6
GOWN STRL REUS W/TWL XL LVL3 (GOWN DISPOSABLE) ×4
HEMOSTAT SNOW SURGICEL 2X4 (HEMOSTASIS) ×3 IMPLANT
INSERT FOGARTY SM (MISCELLANEOUS) IMPLANT
KIT BASIN OR (CUSTOM PROCEDURE TRAY) ×3 IMPLANT
KIT ROOM TURNOVER OR (KITS) ×3 IMPLANT
LIQUID BAND (GAUZE/BANDAGES/DRESSINGS) ×3 IMPLANT
NS IRRIG 1000ML POUR BTL (IV SOLUTION) ×9 IMPLANT
PACK CAROTID (CUSTOM PROCEDURE TRAY) ×3 IMPLANT
PAD ARMBOARD 7.5X6 YLW CONV (MISCELLANEOUS) ×6 IMPLANT
PATCH VASCULAR VASCU GUARD 1X6 (Vascular Products) ×3 IMPLANT
PENCIL BUTTON HOLSTER BLD 10FT (ELECTRODE) ×3 IMPLANT
PROBE PENCIL 8 MHZ STRL DISP (MISCELLANEOUS) IMPLANT
SHUNT CAROTID BYPASS 10 (VASCULAR PRODUCTS) IMPLANT
SHUNT CAROTID BYPASS 12FRX15.5 (VASCULAR PRODUCTS) IMPLANT
SPONGE INTESTINAL PEANUT (DISPOSABLE) ×6 IMPLANT
SUT ETHILON 3 0 PS 1 (SUTURE) IMPLANT
SUT PROLENE 6 0 BV (SUTURE) ×3 IMPLANT
SUT PROLENE 7 0 BV 1 (SUTURE) ×3 IMPLANT
SUT PROLENE 7 0 BV1 MDA (SUTURE) ×3 IMPLANT
SUT SILK 3 0 (SUTURE) ×3
SUT SILK 3 0 TIES 17X18 (SUTURE)
SUT SILK 3-0 18XBRD TIE 12 (SUTURE) ×1 IMPLANT
SUT SILK 3-0 18XBRD TIE BLK (SUTURE) IMPLANT
SUT VIC AB 3-0 SH 27 (SUTURE) ×6
SUT VIC AB 3-0 SH 27X BRD (SUTURE) ×2 IMPLANT
SUT VICRYL 4-0 PS2 18IN ABS (SUTURE) ×3 IMPLANT
WATER STERILE IRR 1000ML POUR (IV SOLUTION) ×3 IMPLANT

## 2014-06-14 NOTE — Progress Notes (Signed)
Give albumin 5% 250 cc's per Dr Trula Wayne Brunker

## 2014-06-14 NOTE — Interval H&P Note (Signed)
History and Physical Interval Note:  06/14/2014 10:02 AM  Alex [name]  has presented today for surgery, with the diagnosis of Left internal carotid artery stenosis I65.22  The various methods of treatment have been discussed with the patient and family. After consideration of risks, benefits and other options for treatment, the patient has consented to  Procedure(s): ENDARTERECTOMY CAROTID (Left) as a surgical intervention .  The patient's history has been reviewed, patient examined, no change in status, stable for surgery.  I have reviewed the patient's chart and labs.  Questions were answered to the patient's satisfaction.     BRABHAM IV, V. WELLS

## 2014-06-14 NOTE — Op Note (Signed)
Patient name: Alex [name] MRN: [id number] DOB: 08-Jun-1931 Sex: male  06/14/2014 Pre-operative Diagnosis: Asymptomatic   left carotid stenosis Post-operative diagnosis:  Same Surgeon:  Eldridge Abrahams Assistants:  Lennie Muckle Procedure:    left carotid Endarterectomy with bovine pericardial patch angioplasty Anesthesia:  General Blood Loss:  See anesthesia record Specimens:  Carotid Plaque to pathology  Findings:  85 %stenosis; Thrombus:  none  Indications:  The patient has had progressive increase in the stenosis of his left internal carotid artery.  CT angiography revealed a ulcerated lesion.  He is asymptomatic  Procedure:  The patient was identified in the holding area and taken to [address]  The patient was then placed supine on the table.   General endotrachial anesthesia was administered.  The patient was prepped and draped in the usual sterile fashion.  A time out was called and antibiotics were administered.  The incision was made along the anterior border of the left sternocleidomastoid muscle.  Cautery was used to dissect through the subcutaneous tissue.  The platysma muscle was divided with cautery.  The internal jugular vein was exposed along its anterior medial border.  The common facial vein was exposed and then divided between 2-0 silk ties and metal clips.  The common carotid artery was then circumferentially exposed and encircled with an umbilical tape.  The vagus nerve was identified and protected.  Next sharp dissection was used to expose the external carotid artery and the superior thyroid artery.  The were encircled with a blue vessel loop and a 2-0 silk tie respectively.  Finally, the internal carotid was carefully dissected free.  An umbilical tape was placed around the internal carotid artery distal to the diseased segment.  The hypoglossal nerve was visualized throughout and protected.  The patient was given systemic heparinization.  A bovine carotid patch was  selected and prepared on the back table.  A 10 french shunt was also prepared.  After blood pressure readings were appropriate and the heparin had been given time to circulate, the internal carotid artery was occluded with a baby Gregory clamp.  The external and common carotid arteries were then occluded with vascular clamps and the 2-0 tie tightened on the superior thyroid artery.  A #11 blade was used to make an arteriotomy in the common carotid artery.  This was extended with Potts scissors along the anterior and lateral border of the common and internal carotid artery.  Approximately 85% stenosis was identified.  There was no thrombus identified.  The 10 french shunt was not placed given excellent backbleeding.  .  A kleiner kuntz elevator was used to perform endarterectomy.  An eversion endarterectomy was performed in the external carotid artery.  A good distal endpoint was obtained in the internal carotid artery.  The specimen was removed and sent to pathology.  Heparinized saline was used to irrigate the endarterectomized field.  All potential embolic debris was removed.  Bovine pericardial patch angioplasty was then performed using a running 6-0 Prolene. The common internal and external carotid arteries were all appropriately flushed. The artery was again irrigated with heparin saline.  The anastomosis was then secured. The clamp was first released on the external carotid artery followed by the common carotid artery approximately 30 seconds later, bloodflow was reestablish through the internal carotid artery.  Next, a hand-held  Doppler was used to evaluate the signals in the common, external, and internal  carotid arteries, all of which had appropriate signals. I  then administered  50 mg protamine. The wound was then irrigated.  After hemostasis was achieved, the carotid sheath was reapproximated with 3-0 Vicryl. The  platysma muscle was reapproximated with running 3-0 Vicryl. The skin  was closed with  4-0 Vicryl. Dermabond was placed on the skin. The  patient was then successfully extubated. His neurologic exam was  similar to his preprocedural exam. The patient was then taken to recovery room  in stable condition. There were no complications.     Disposition:  To PACU in stable condition.  Relevant Operative Details:  Lesion was approximately 2.5 cm above the carotid bifurcation.  The patient had a stiff neck making exposure somewhat limited.  I did not place a shunt.  He did have excellent back bleeding so I did not think it was necessary.  The lesion was ulcerated plaque  . Annamarie Major, M.D. [facility] [place] [facility] Office: [phone] Pager:  [phone]

## 2014-06-14 NOTE — Progress Notes (Signed)
Pt arrived to unit accompanied by PACU RN. Pt oriented to room/unit. VS stable, no s/s of acute distress noted. Pt's children at bedside.

## 2014-06-14 NOTE — Progress Notes (Signed)
Give Albumin 5% 250 cc's per Dr Ermalene Postin

## 2014-06-14 NOTE — Transfer of Care (Signed)
Immediate Anesthesia Transfer of Care Note  Patient: Alex [name]  Procedure(s) Performed: Procedure(s): LEFT CAROTID ENDARTERECTOMY WITH VASCUGUARD PATCH ANGIOPLASTY (Left)  Patient Location: PACU  Anesthesia Type:General  Level of Consciousness: awake, alert  and patient cooperative  Airway & Oxygen Therapy: Patient Spontanous Breathing and Patient connected to nasal cannula oxygen  Post-op Assessment: Report given to RN, Post -op Vital signs reviewed and stable, Patient moving all extremities and Patient able to stick tongue midline  Post vital signs: Reviewed and stable  Last Vitals:  Filed Vitals:   06/14/14 1306  BP:   Pulse: 86  Temp: 36.2 C  Resp: 8    Complications: No apparent anesthesia complications

## 2014-06-14 NOTE — Anesthesia Preprocedure Evaluation (Signed)
Anesthesia Evaluation  Patient identified by MRN, date of birth, ID band Patient awake    Reviewed: Allergy & Precautions, NPO status , Patient's Chart, lab work & pertinent test results  History of Anesthesia Complications Negative for: history of anesthetic complications  Airway Mallampati: III  TM Distance: >3 FB Neck ROM: Full    Dental  (+) Teeth Intact   Pulmonary shortness of breath, neg sleep apnea, neg recent URI, former smoker,  breath sounds clear to auscultation        Cardiovascular hypertension, Pt. on medications + CAD, + Past MI, + CABG, + Peripheral Vascular Disease and +CHF Rhythm:Regular     Neuro/Psych negative neurological ROS  negative psych ROS   GI/Hepatic negative GI ROS, Neg liver ROS,   Endo/Other  negative endocrine ROS  Renal/GU negative Renal ROS     Musculoskeletal   Abdominal   Peds  Hematology negative hematology ROS (+)   Anesthesia Other Findings   Reproductive/Obstetrics                             Anesthesia Physical Anesthesia Plan  ASA: III  Anesthesia Plan: General   Post-op Pain Management:    Induction: Intravenous  Airway Management Planned: Oral ETT  Additional Equipment: Arterial line  Intra-op Plan:   Post-operative Plan: Extubation in OR  Informed Consent: I have reviewed the patients History and Physical, chart, labs and discussed the procedure including the risks, benefits and alternatives for the proposed anesthesia with the patient or authorized representative who has indicated his/her understanding and acceptance.   Dental advisory given  Plan Discussed with: CRNA and Surgeon  Anesthesia Plan Comments:         Anesthesia Quick Evaluation

## 2014-06-14 NOTE — Anesthesia Procedure Notes (Signed)
Procedure Name: Intubation Date/Time: 06/14/2014 10:29 AM Performed by: Julian Reil Pre-anesthesia Checklist: Patient identified, Emergency Drugs available, Suction available and Patient being monitored Patient Re-evaluated:Patient Re-evaluated prior to inductionOxygen Delivery Method: Circle system utilized Preoxygenation: Pre-oxygenation with 100% oxygen Intubation Type: IV induction Ventilation: Mask ventilation without difficulty Laryngoscope Size: Mac and 4 Grade View: Grade I Tube type: Oral Tube size: 7.5 mm Number of attempts: 1 Airway Equipment and Method: Stylet and LTA kit utilized Placement Confirmation: ETT inserted through vocal cords under direct vision,  positive ETCO2 and breath sounds checked- equal and bilateral Secured at: 22 cm Tube secured with: Tape Dental Injury: Teeth and Oropharynx as per pre-operative assessment

## 2014-06-14 NOTE — Progress Notes (Signed)
PHARMACIST - PHYSICIAN ORDER COMMUNICATION  CONCERNING: P&T Medication Policy on Herbal Medications  DESCRIPTION:  This patient's order for:  Resveratrol has been noted.  This product(s) is classified as an "herbal" or natural product. Due to a lack of definitive safety studies or FDA approval, nonstandard manufacturing practices, plus the potential risk of unknown drug-drug interactions while on inpatient medications, the Pharmacy and Therapeutics Committee does not permit the use of "herbal" or natural products of this type within [facility].   ACTION TAKEN: The pharmacy department is unable to verify this order at this time and your patient has been informed of this safety policy. Please reevaluate patient's clinical condition at discharge and address if the herbal or natural product(s) should be resumed at that time.   Hassie Bruce, Pharm. D. Clinical Pharmacy Resident Pager: [phone] Ph: [phone] 06/14/2014 6:33 PM

## 2014-06-14 NOTE — Anesthesia Postprocedure Evaluation (Signed)
  Anesthesia Post-op Note  Patient: Alex [name]  Procedure(s) Performed: Procedure(s): LEFT CAROTID ENDARTERECTOMY WITH VASCUGUARD PATCH ANGIOPLASTY (Left)  Patient Location: PACU  Anesthesia Type:General  Level of Consciousness: awake  Airway and Oxygen Therapy: Patient Spontanous Breathing and Patient connected to nasal cannula oxygen  Post-op Pain: mild  Post-op Assessment: Post-op Vital signs reviewed, Patient's Cardiovascular Status Stable, Respiratory Function Stable, Patent Airway, No signs of Nausea or vomiting and Pain level controlled  Post-op Vital Signs: Reviewed and stable  Last Vitals:  Filed Vitals:   06/14/14 1430  BP: 87/49  Pulse: 87  Temp:   Resp: 7    Complications: No apparent anesthesia complications

## 2014-06-14 NOTE — H&P (View-Only) (Signed)
Patient name: Alex [name] MRN: [id number] DOB: 06/02/1931 Sex: male   Referred by: Dr. Gwenlyn Found  Reason for referral:  Chief Complaint  Patient presents with  . New Evaluation    L ICA stenosis     HISTORY OF PRESENT ILLNESS: This is a very pleasant 81 year old gentleman who is referred today for evaluation of progressive asymptomatic left carotid stenosis.specifically, the patient denies numbness or weakness in either extremity.  He denies slurred speech.  He denies amaurosis fugax.  He does complain of occasional dizziness or lightheadedness which has occurred over the past couple of days.  The patient had a ultrasound last year that showed less than 70% stenosis.  Most recently his ultrasound has progressed to greater than 70% stenosis.  He has had a CT scan which shows a string sign. The patient has a history of coronary artery disease and is status post CABG 4 in 2000.  The patient suffers from hypercholesterolemia.  His most recent LDL was 140.  He has been on the statins in the past approximately 15 years ago but has not been taking them.  He does have a prescription.  He also does not take an aspirin currently.  He is medically managed for hypertension.  He has a remote history of smoking.  Past Medical History  Diagnosis Date  . Urinary frequency   . 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 Cellulitis and abscess of unspecified site   . Obesity, unspecified   . Unspecified vitamin D deficiency   . Palpitations   . Hypertrophy of prostate with urinary obstruction and other lower urinary tract symptoms (LUTS)   . Insomnia, unspecified   . Acquired deformity of nose   . Cervicalgia   . Dizziness and giddiness   . Other premature beats   . Special screening for malignant neoplasm of prostate   . Slowing of urinary stream   . Shortness of breath   . Encounter for long-term  (current) use of other medications   . Esophagitis, unspecified   . Other and unspecified hyperlipidemia   . Calculus of kidney   . Unspecified essential hypertension   . Old myocardial infarction   . Coronary atherosclerosis of unspecified type of vessel, native or graft   . Internal hemorrhoids without mention of complication   . Carotid artery disease   . Hyperlipidemia   . CHF (congestive heart failure)     Past Surgical History  Procedure Laterality Date  . Tonsillectomy  1940  . Excisional hemorrhoidectomy  1981  . Cabg x 4  2000  . Lesion from forehead,(r) ear (l) arm      DR Almira Coaster  . Lithotripsy  2005    Dr Almira Coaster  . Esophagogastroduodenoscopy endoscopy  2007    Dr Lajoyce Corners, with biopsy  . Colonoscopy  2007    Dr Lajoyce Corners, normal  . Coronary artery bypass graft      History   Social History  . Marital Status: Widowed    Spouse Name: N/A    Number of Children: N/A  . Years of Education: N/A   Occupational History  . Not on file.   Social History Main Topics  . Smoking status: Former Smoker -- 1.50 packs/day for 22 years    Types: Cigarettes    Quit date: 05/03/1973  . Smokeless tobacco: Never Used  . Alcohol Use: 3.6  oz/week    3 Glasses of wine, 3 Cans of beer per week  . Drug Use: No  . Sexual Activity: No   Other Topics Concern  . Not on file   Social History Narrative    Family History  Problem Relation Age of Onset  . Cancer Mother     LIVER  . Cancer Brother     PROSTATE  . Cancer Daughter     Allergies as of 05/27/2014  . (No Known Allergies)    Current Outpatient Prescriptions on File Prior to Visit  Medication Sig Dispense Refill  . ADVAIR DISKUS 250-50 MCG/DOSE AEPB inhale 1 puff by mouth twice a day 60 each 5  . aspirin 81 MG tablet Take 81 mg by mouth daily. Take one tablet once a day    . b complex vitamins tablet Take 1 tablet by mouth daily.    . cholecalciferol (VITAMIN D) 1000 UNITS tablet Take 1,000 Units by mouth  daily. Take two tablets daily    . Cinnamon 500 MG TABS Take 1 tablet by mouth daily.    Marland Kitchen CITRUS BERGAMOT PO Take by mouth. Take one capsule once daily to lower cholesterol    . Coconut Oil 1000 MG CAPS Take 1 capsule by mouth daily.    . Coenzyme Q10 (CO Q 10) 100 MG CAPS Take 1 capsule by mouth daily.    . diazepam (VALIUM) 5 MG tablet take 1/2 tablet by mouth UP TO TWICE A DAY AS NEEDED FOR DIZZINESS 30 tablet 0  . doxepin (SINEQUAN) 25 MG capsule   0  . fish oil-omega-3 fatty acids 1000 MG capsule Take 2 g by mouth daily. Take one tablet once a day for cholesterol    . Garcinia Cambogia-Chromium 500-200 MG-MCG TABS Take 1 tablet by mouth daily.    Marland Kitchen ibuprofen (ADVIL,MOTRIN) 200 MG tablet Take 200 mg by mouth every 6 (six) hours as needed for pain. Take 3-4 tablets twice a day for neck pain as needed    . Menaquinone-7 (VITAMIN K2 PO) Take 50 mcg by mouth daily.    . Nutritional Supplements (NUTRITIONAL SUPPLEMENT PO) Synerflex. Take 2 tablets by mouth daily.    . Nutritional Supplements (NUTRITIONAL SUPPLEMENT PO) Ginger and Turmeric. Take 1 capsule by mouth daily.    . Potassium 99 MG TABS Take by mouth. Take one tablet once a day for cramps    . ramipril (ALTACE) 5 MG capsule Take 1 capsule (5 mg total) by mouth daily. 90 capsule 3  . RESVERATROL 100 MG CAPS Take by mouth. Take one tablet once a day     No current facility-administered medications on file prior to visit.     REVIEW OF SYSTEMS: Cardiovascular: No chest pain, chest pressure, palpitations, orthopnea, No claudication or rest pain,  No history of DVT or phlebitis.positive for shortness of breath with exertion Pulmonary: No productive cough, asthma.positive for wheezing. Neurologic: No weakness, paresthesias, aphasia, or amaurosis. occasional dizziness. Hematologic: No bleeding problems or clotting disorders. Musculoskeletal: No joint pain or joint swelling. Gastrointestinal: No blood in stool or  hematemesis Genitourinary: No dysuria or hematuria. Psychiatric:: No history of major depression. Integumentary: No rashes or ulcers. Constitutional: No fever or chills.  PHYSICAL EXAMINATION: General: The patient appears their stated age.  Vital signs are BP 146/79 mmHg  Pulse 98  Ht 5\' 11"  (1.803 m)  Wt 206 lb (93.441 kg)  BMI 28.74 kg/m2  SpO2 97% HEENT:  No gross abnormalities Pulmonary: Respirations are non-labored  Abdomen: Soft and non-tender .  Aorta is not palpable Musculoskeletal: There are no major deformities.   Neurologic: No focal weakness or paresthesias are detected, Skin: There are no ulcer or rashes noted. Psychiatric: The patient has normal affect. Cardiovascular: There is a regular rate and rhythm without significant murmur appreciated.palpable pedal pulses.  No carotid bruit  Diagnostic Studies: I have reviewed his CT scan which shows a radiographic string sign on the left.  There also appears to be soft plaque and ulceration at the bifurcation   Assessment:  Asymptomatic left carotid stenosis Plan: After reviewing the patient's CT scan and seeing the ulcer with thrombus burden and soft plaque, I do not feel that he would be a good candidate for the CREST II trial, but rather would do better with intervention.  We have decided to proceed with left carotid endarterectomy.  I discussed the risks and benefits including the risk of nerve injury stroke and bleeding.  All of his questions have been answered and he wishes to proceed.  His operation has been scheduled for Friday, February 12.  I have also encouraged the patient to restart taking his statin medication as well as his baby aspirin.  He has agreed to do so.     Eldridge Abrahams, M.D. [facility] [place] Office: [phone] Pager:  [phone]

## 2014-06-14 NOTE — Progress Notes (Signed)
         Patient alert and oriented.  He states his left hand and fingers feel numb and it wakes up once he moves it.   Left neck incision C/D/I Smile is symmetrical No tongue deviation Grip 5/5 right hand  S/P Left CEA  Synthia Fairbank MAUREEN PA-C

## 2014-06-15 LAB — CBC
HCT: 35.4 % — ABNORMAL LOW (ref 39.0–52.0)
Hemoglobin: 11.9 g/dL — ABNORMAL LOW (ref 13.0–17.0)
MCH: 32.9 pg (ref 26.0–34.0)
MCHC: 33.6 g/dL (ref 30.0–36.0)
MCV: 97.8 fL (ref 78.0–100.0)
Platelets: 181 10*3/uL (ref 150–400)
RBC: 3.62 MIL/uL — ABNORMAL LOW (ref 4.22–5.81)
RDW: 13.8 % (ref 11.5–15.5)
WBC: 7.3 10*3/uL (ref 4.0–10.5)

## 2014-06-15 LAB — BASIC METABOLIC PANEL
ANION GAP: 6 (ref 5–15)
BUN: 6 mg/dL (ref 6–23)
CHLORIDE: 108 mmol/L (ref 96–112)
CO2: 24 mmol/L (ref 19–32)
Calcium: 7.8 mg/dL — ABNORMAL LOW (ref 8.4–10.5)
Creatinine, Ser: 0.78 mg/dL (ref 0.50–1.35)
GFR calc Af Amer: >90 mL/min (ref >90)
GFR, EST NON AFRICAN AMERICAN: 83 mL/min — AB (ref >90)
Glucose, Bld: 98 mg/dL (ref 70–99)
POTASSIUM: 3.6 mmol/L (ref 3.5–5.1)
SODIUM: 138 mmol/L (ref 135–145)

## 2014-06-15 MED ORDER — TAMSULOSIN HCL 0.4 MG PO CAPS: 0.4000 mg | ORAL_CAPSULE | Freq: Every day | ORAL | Status: DC

## 2014-06-15 NOTE — Progress Notes (Signed)
Received return paged from MD Bridgett Larsson. Dr. Bridgett Larsson recommending pt dc home with foley and follow up with urology at later date. This was conveyed to pt and daughter. Daughter and pt explained concerns with foley cath. Agricultural consultant and I spoke with family about concerns. PA notified.

## 2014-06-15 NOTE — Plan of Care (Signed)
Problem: Phase I Progression Outcomes Goal: Voiding after catheter removal Outcome: Not Progressing Had to insert foley catheter secondary to urinary retention

## 2014-06-15 NOTE — Progress Notes (Addendum)
  [facility] Progress Note  06/15/2014 7:51 AM 1 Day Post-Op  Subjective:  No complaints.   Tmax 98.6 BP sys 80s-140s 02 94% RA  Filed Vitals:   06/15/14 0700  BP:   Pulse:   Temp: 98.6 F (37 C)  Resp:      Physical Exam: Neuro:  5/5 strength upper and lower extremities, no facial asymmetry, no tongue deviation Incision:  Left incision healing well. No hematoma present.   CBC    Component Value Date/Time   WBC 7.3 06/15/2014 0500   RBC 3.62* 06/15/2014 0500   HGB 11.9* 06/15/2014 0500   HCT 35.4* 06/15/2014 0500   PLT 181 06/15/2014 0500   MCV 97.8 06/15/2014 0500   MCH 32.9 06/15/2014 0500   MCHC 33.6 06/15/2014 0500   RDW 13.8 06/15/2014 0500    BMET    Component Value Date/Time   NA 138 06/15/2014 0500   NA 138 06/03/2014 0848   K 3.6 06/15/2014 0500   CL 108 06/15/2014 0500   CO2 24 06/15/2014 0500   GLUCOSE 98 06/15/2014 0500   GLUCOSE 91 06/03/2014 0848   BUN 6 06/15/2014 0500   BUN 12 06/03/2014 0848   CREATININE 0.78 06/15/2014 0500   CREATININE 0.86 05/15/2014 1410   CALCIUM 7.8* 06/15/2014 0500   GFRNONAA 83* 06/15/2014 0500   GFRAA >90 06/15/2014 0500     Intake/Output Summary (Last 24 hours) at 06/15/14 0751 Last data filed at 06/15/14 0738  Gross per 24 hour  Intake   2475 ml  Output   1575 ml  Net    900 ml      Assessment/Plan:  This is a 81 y.o. male who is s/p left CEA 1 Day Post-Op  -Patient is doing well this am. -Neuro exam is intact -Had urinary retention last night. History of BPH. Received single dose of flomax yesterday. Will d/c foley today for voiding trial. If unable to void, will have foley reinserted and follow up with urology as outpatient.  -Discharge home after voiding and ambulating.    Virgina Jock, PA-C [facility] Office: [phone] Pager: [phone] 06/15/2014 7:51 AM   Addendum  I have independently interviewed and examined the patient, and I agree with  the physician assistant's findings.  Neuro intact.  No neck hematoma.  Hemodynamics have stabilized.  As noted above, only issue is urinary retention.  Pt has h/o BPH sx.  Void trial.  I he fails, replace foley and attach to leg bag.  D/C home  Adele Barthel, MD [facility] [place] Office: [phone] Pager: [phone]  06/15/2014, 8:19 AM

## 2014-06-15 NOTE — Progress Notes (Signed)
Patient complaining of bladder pressure and urgency. Only voiding 50-100 cc at a time. Bladder scan preformed, 852cc present. Dr. Bridgett Larsson notified. New orders received. Foley catheter inserted per order and [facility] policy with two RNs present for insertion. Patient tolerated procedure well. Will continue to monitor output.   Lum Babe, RN

## 2014-06-15 NOTE — Progress Notes (Signed)
Bladder scanned pt post void, scanner demonstrated 672ml's. Pt explained he does not want to go home with foley catheter and would prefer to have I&O cath material and follow up with urology as soon as he can. Paged MD, awaiting return of page.

## 2014-06-15 NOTE — Progress Notes (Signed)
Bladder scanned pt at 1545. Scan revealed 750- 825ml. I&O cathd pt per MD order and 840ml removed. Spoke with daughter and pt, they are willing to DC home with foley if patient cannot urinate adequately in four hours. MD & PA aware. Will continue to monitor pt.

## 2014-06-16 NOTE — Progress Notes (Signed)
Dr. Bridgett Larsson returned page and made aware of the situation. Dr. Bridgett Larsson stated that pt was medical stable and able to be discharged. Pt needed to be discharged with the foley catheter and leg bag due to acute urinary retention and will follow up with urology. Pt and family made aware of this and agreed to the discharge. Pt family requested that urine be send to the lab to be checked for UTI. Dr. Bridgett Larsson gave order for a UA to be collected at time of catheter placement. Will continue to monitor pt.

## 2014-06-16 NOTE — Progress Notes (Signed)
1045Pt bladder scanned at has 890ml in bladder pt has put out 175 in urinal. Leg bag arrived. Pt gotten in the bed and 2nd RN at bed side. Aseptic techque used to place foley catheter. Catheter put out 654ml of clear yellow urine with some small clots. Pt complained of minor pain with foley placement.   2320 pt, family, and RN reviewed D/c paper work and addressed any questions. Pt signed paper work. IV removed no redness, swelling or pain at site. All belongs went with pt and family. Pt wheeled off the unit in a wheel chair by RN.

## 2014-06-16 NOTE — Progress Notes (Signed)
Still awaiting leg bag from central supply. Called they stated would be up shortly

## 2014-06-16 NOTE — Progress Notes (Signed)
Pt Bladder scanned and has 444 ml in bladder. Pt family wanted more time for pt to urinate. Pt family has multiple concerns. The first is the pt having to leave the hospital with a catheter they are concerned that he will get an infection. The second is that his HR is higher than normal for him. His current Hr is 90-104 NSR with occasional PVC. They are also concerned about his temp of 99.6 oral. RN discussed that the hr was still a normal rate and rhythm and that the temp was very low grade Temp. The RN educated family and Pt on why the pt was going to need the foley catheter and what would happen if he did not get the catheter and how to care for the catheter. Family wanted to ensure that Dr. Bridgett Larsson was aware of the condition the pt was being d/c under. Dr. Bridgett Larsson paged. Temp retaken and is 98.9 oral. Will continue to monitor pt.

## 2014-06-16 NOTE — Discharge Summary (Signed)
[facility] Discharge Summary  Alex [name] October 04, 1931 81 y.o. male  [id number]  Admission Date: 06/14/2014  Discharge Date: 06/15/2014  Physician: Harold Barban, MD  Admission Diagnosis: Left internal carotid artery stenosis I65.22  HPI:   This is a 81 y.o. male who was referred to Dr. Trula Slade for evaluation of progressive asymptomatic left carotid stenosis.specifically, the patient denies numbness or weakness in either extremity. He denies slurred speech. He denies amaurosis fugax. He does complain of occasional dizziness or lightheadedness which has occurred over the past couple of days. The patient had a ultrasound last year that showed less than 70% stenosis. Most recently his ultrasound has progressed to greater than 70% stenosis. He has had a CT scan which shows a string sign. The patient has a history of coronary artery disease and is status post CABG 4 in 2000.  The patient suffers from hypercholesterolemia. His most recent LDL was 140. He has been on the statins in the past approximately 15 years ago but has not been taking them. He does have a prescription. He also does not take an aspirin currently. He is medically managed for hypertension. He has a remote history of smoking.  Hospital Course:  The patient was admitted to the hospital and taken to the operating room on 06/14/2014 and underwent left carotid endarterectomy.  The patient tolerated the procedure well and was transported to the PACU in stable condition.  By POD 1, the patient's neuro status was intact. His left neck incision was clean and intact without hematoma. Overnight he complained of bladder pressure and urgency. Bladder scan revealed 852 cc and a foley catheter was inserted and received a dose of flomax. His foley was removed on morning of  POD 1 for voiding trial. He also received a dose of flomax. He was unable to void adequately and plans were made to have him discharged home  with a foley reinserted with follow up with urology. The patient and patient's daughter did not want to have foley reinserted due to fear for UTI. They wanted to stay longer to see if the voiding improved. His bladder was scanned again with 800 cc and an in and out catheter was used. The patient and family still did not want a catheter placed and agreed to be discharged home with foley if he did not void adequately in 4 hours. Late on POD 1, the patient was discharged home with foley. He had ambulated well and neuro status was intact. He was prescribed flomax and an appointment with urology will made for him following the weekend.    Recent Labs  06/15/14 0500  NA 138  K 3.6  CL 108  CO2 24  GLUCOSE 98  BUN 6  CALCIUM 7.8*    Recent Labs  06/14/14 1904 06/15/14 0500  WBC 9.8 7.3  HGB 12.6* 11.9*  HCT 38.0* 35.4*  PLT 172 181   No results for input(s): INR in the last 72 hours.  Discharge Instructions:   The patient is discharged to home with extensive instructions on wound care and progressive ambulation.  They are instructed not to drive or perform any heavy lifting until returning to see the physician in his office.  Discharge Instructions    Call MD for:  redness, tenderness, or signs of infection (pain, swelling, bleeding, redness, odor or green/yellow discharge around incision site)    Complete by:  As directed      Call MD for:  severe or increased pain, loss  or decreased feeling  in affected limb(s)    Complete by:  As directed      Call MD for:  temperature >100.5    Complete by:  As directed      Discharge instructions    Complete by:  As directed   You may shower in 24 hours     Driving Restrictions    Complete by:  As directed   No driving for 2 weeks     Increase activity slowly    Complete by:  As directed   Walk with assistance use walker or cane as needed     Lifting restrictions    Complete by:  As directed   No lifting for 6 weeks     Resume previous  diet    Complete by:  As directed            Discharge Diagnosis:  Left internal carotid artery stenosis I65.22  Secondary Diagnosis: Patient Active Problem List   Diagnosis Date Noted  . Carotid stenosis 06/14/2014  . 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 Bilateral knee pain 01/30/2014  . Cerumen impaction 01/30/2014  . Edema 10/09/2013  . Reflux esophagitis 04/10/2013  . Carotid artery disease 11/09/2012  . Hyperlipidemia   . Essential hypertension   . Coronary atherosclerosis   . Dizziness and giddiness   . Obesity, unspecified   . Insomnia, unspecified   . Hypertrophy of prostate with urinary obstruction and other lower urinary tract symptoms (LUTS)   . Unspecified vitamin D deficiency    Past Medical History  Diagnosis Date  . Urinary frequency   . 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 Cellulitis and abscess of unspecified site   . Obesity, unspecified   . Unspecified vitamin D deficiency   . Palpitations   . Hypertrophy of prostate with urinary obstruction and other lower urinary tract symptoms (LUTS)   . Insomnia, unspecified   . Acquired deformity of nose   . Cervicalgia   . Dizziness and giddiness   . Other premature beats   . Special screening for malignant neoplasm of prostate   . Slowing of urinary stream   . Shortness of breath   . Encounter for long-term (current) use of other medications   . Esophagitis, unspecified   . Other and unspecified hyperlipidemia   . Calculus of kidney   . Unspecified essential hypertension   . Old myocardial infarction   . Coronary atherosclerosis of unspecified type of vessel, native or graft   . Internal hemorrhoids without mention of complication   . Carotid artery disease   . Hyperlipidemia   . CHF (congestive heart failure)       Medication List    TAKE these medications        ADVAIR DISKUS 250-50 MCG/DOSE Aepb    Generic drug:  Fluticasone-Salmeterol  inhale 1 puff by mouth twice a day     aspirin 81 MG tablet  Take 81 mg by mouth daily. Take one tablet once a day     atorvastatin 10 MG tablet  Commonly known as:  LIPITOR  Take 10 mg by mouth daily.     b complex vitamins tablet  Take 1 tablet by mouth daily.     cholecalciferol 1000 UNITS tablet  Commonly known as:  VITAMIN D  Take 1,000 Units by mouth daily.     Cinnamon 500 MG Tabs  Take 1 tablet by mouth daily.     CITRUS BERGAMOT PO  Take 1 capsule by mouth daily. Take one capsule once daily to lower cholesterol     Co Q 10 100 MG Caps  Take 1 capsule by mouth daily.     diazepam 5 MG tablet  Commonly known as:  VALIUM  take 1/2 tablet by mouth UP TO TWICE A DAY AS NEEDED FOR DIZZINESS     doxepin 25 MG capsule  Commonly known as:  SINEQUAN  Take 25 mg by mouth at bedtime.     fish oil-omega-3 fatty acids 1000 MG capsule  Take 2 g by mouth daily. Take one tablet once a day for cholesterol     ibuprofen 200 MG tablet  Commonly known as:  ADVIL,MOTRIN  Take 200 mg by mouth every 6 (six) hours as needed for pain. Take 3-4 tablets twice a day for neck pain as needed     LORazepam 1 MG tablet  Commonly known as:  ATIVAN  Take one tablet by mouth at bedtime as needed for rest     NUTRITIONAL SUPPLEMENT PO  Synerflex. Take 2 tablets by mouth daily.     NUTRITIONAL SUPPLEMENT PO  Ginger and Turmeric. Take 1 capsule by mouth daily.     oxyCODONE-acetaminophen 5-325 MG per tablet  Commonly known as:  PERCOCET/ROXICET  Take 1 tablet by mouth every 6 (six) hours as needed.     Potassium 99 MG Tabs  Take 1 tablet by mouth daily. Take one tablet once a day for cramps     ramipril 5 MG capsule  Commonly known as:  ALTACE  Take 1 capsule (5 mg total) by mouth daily.     Resveratrol 100 MG Caps  Take 1 capsule by mouth daily. Take one tablet once a day     tamsulosin 0.4 MG Caps capsule  Commonly known as:  FLOMAX   Take 1 capsule (0.4 mg total) by mouth daily.        Percocet #30 No Refill  Disposition: Home  Patient's condition: is Good  Follow up: 1. Dr.  Trula Slade in 2 weeks. 2. Urology   Virgina Jock, PA-C [facility] [phone]  --- For [facility] use --- Instructions: Press F2 to tab through selections.  Delete question if not applicable.   Modified Rankin score at D/C (0-6): 0  IV medication needed for:  1. Hypertension: No 2. Hypotension: No  Post-op Complications: No  1. Post-op CVA or TIA: No  2. CN injury: No  3. Myocardial infarction: No  4.  CHF: No  5.  Dysrhythmia (new): No  6. Wound infection: No  7. Reperfusion symptoms: No  8. Return to OR: No  Discharge medications: Statin use:  Yes If No: [ ]  For Medical reasons, [ ]  Non-compliant, [ ]  Not-indicated ASA use:  Yes  If No: [ ]  For Medical reasons, [ ]  Non-compliant, [ ]  Not-indicated Beta blocker use:  No If No: [ ]  For Medical reasons, [ ]  Non-compliant, [ x] Not-indicated ACE-Inhibitor use:  Yes If No: [ ]  For Medical reasons, [ ]  Non-compliant, [ ]  Not-indicated P2Y12 Antagonist use: No, [ ]  Plavix, [ ]  Plasugrel, [ ]  Ticlopinine, [ ]  Ticagrelor, [ ]  Other, [ ]  No for medical reason, [ ]  Non-compliant, [x ] Not-indicated Anti-coagulant use:  No, [ ]  Warfarin, [ ]  Rivaroxaban, [ ]  Dabigatran, [ ]  Other, [ ]  No for medical reason, [ ]  Non-compliant, [  x] Not-indicated

## 2014-06-17 ENCOUNTER — Other Ambulatory Visit: Payer: Self-pay | Admitting: *Deleted

## 2014-06-17 ENCOUNTER — Encounter (HOSPITAL_COMMUNITY): Payer: Self-pay | Admitting: Surgery

## 2014-06-17 DIAGNOSIS — Z978 Presence of other specified devices: Secondary | ICD-10-CM

## 2014-06-17 DIAGNOSIS — Z960 Presence of urogenital implants: Principal | ICD-10-CM

## 2014-06-18 ENCOUNTER — Telehealth: Payer: Self-pay | Admitting: Surgery

## 2014-06-18 NOTE — Telephone Encounter (Signed)
Spoke with pts daughter. Also, he has been referred to [facility] due to his foley catheter placement while hospitalized. I have faxed our records to [facility].

## 2014-06-18 NOTE — Telephone Encounter (Signed)
-----   Message from Mena Goes, RN sent at 06/17/2014  9:08 AM EST ----- Regarding: Schedule   ----- Message -----    From: Alvia Grove, PA-C    Sent: 06/15/2014   9:04 AM      To: [facility] Charge Pool  S/p left CEA 06/14/14  F/u with Dr. Trula Slade in two weeks.  Thanks, Maudie Mercury

## 2014-06-19 ENCOUNTER — Encounter: Payer: Self-pay | Admitting: Internal Medicine

## 2014-06-19 ENCOUNTER — Ambulatory Visit (INDEPENDENT_AMBULATORY_CARE_PROVIDER_SITE_OTHER): Payer: Commercial Managed Care - HMO | Admitting: Internal Medicine

## 2014-06-19 VITALS — BP 164/90 | HR 92 | Temp 98.1°F | Ht 71.0 in | Wt 206.0 lb

## 2014-06-19 DIAGNOSIS — I6522 Occlusion and stenosis of left carotid artery: Secondary | ICD-10-CM

## 2014-06-19 DIAGNOSIS — I10 Essential (primary) hypertension: Secondary | ICD-10-CM | POA: Diagnosis not present

## 2014-06-19 DIAGNOSIS — N401 Enlarged prostate with lower urinary tract symptoms: Secondary | ICD-10-CM

## 2014-06-19 DIAGNOSIS — N138 Other obstructive and reflux uropathy: Secondary | ICD-10-CM

## 2014-06-19 NOTE — Progress Notes (Signed)
Patient ID: Alex [name], male   DOB: [birth year], 81 y.o.   MRN: [id number]    Facility  [facility]    Place of Service:   OFFICE   No Known Allergies  Chief Complaint  Patient presents with  . Acute Visit    Patient wants referral to Urologist for prostate for enlarged prostate. Patient has an appointment already scheduled with Dr. Arva Chafe MacDiarmid 06/21/2014 @ 9:30. Initiated Referral through [employer], faxing form.    HPI:  Carotid stenosis, left: surgery 06/14/14. Doing well.   Essential hypertension: elevated SBP  BPH (benign prostatic hypertrophy) with urinary obstruction: Had catheter placed after surgery. Has a leg bag. Scheduled to see Dr. Matilde Sprang. Has had a long standing history of BPH with mild obstructive symptoms. Has obtained a tube of Lidocaine OTC product from the drugstore to use on the tip of the penis where he is irritated    Medications: Patient's Medications  New Prescriptions   No medications on file  Previous Medications   ADVAIR DISKUS 250-50 MCG/DOSE AEPB    inhale 1 puff by mouth twice a day   ASPIRIN 81 MG TABLET    Take 81 mg by mouth daily. Take one tablet once a day   ATORVASTATIN (LIPITOR) 10 MG TABLET    Take 10 mg by mouth daily.   B COMPLEX VITAMINS TABLET    Take 1 tablet by mouth daily.   CHOLECALCIFEROL (VITAMIN D) 1000 UNITS TABLET    Take 1,000 Units by mouth daily.    CINNAMON 500 MG TABS    Take 1 tablet by mouth daily.   CITRUS BERGAMOT PO    Take 1 capsule by mouth daily. Take one capsule once daily to lower cholesterol   COENZYME Q10 (CO Q 10) 100 MG CAPS    Take 1 capsule by mouth daily.   DIAZEPAM (VALIUM) 5 MG TABLET    take 1/2 tablet by mouth UP TO TWICE A DAY AS NEEDED FOR DIZZINESS   DOXEPIN (SINEQUAN) 25 MG CAPSULE    Take 25 mg by mouth at bedtime.    FISH OIL-OMEGA-3 FATTY ACIDS 1000 MG CAPSULE    Take 2 g by mouth daily. Take one tablet once a day for cholesterol   IBUPROFEN (ADVIL,MOTRIN) 200 MG TABLET    Take 200 mg by mouth  every 6 (six) hours as needed for pain. Take 3-4 tablets twice a day for neck pain as needed   LORAZEPAM (ATIVAN) 1 MG TABLET    Take one tablet by mouth at bedtime as needed for rest   NUTRITIONAL SUPPLEMENTS (NUTRITIONAL SUPPLEMENT PO)    Synerflex. Take 2 tablets by mouth daily.   NUTRITIONAL SUPPLEMENTS (NUTRITIONAL SUPPLEMENT PO)    Ginger and Turmeric. Take 1 capsule by mouth daily.   OXYCODONE-ACETAMINOPHEN (PERCOCET/ROXICET) 5-325 MG PER TABLET    Take 1 tablet by mouth every 6 (six) hours as needed.   POTASSIUM 99 MG TABS    Take 1 tablet by mouth daily. Take one tablet once a day for cramps   RAMIPRIL (ALTACE) 5 MG CAPSULE    Take 1 capsule (5 mg total) by mouth daily.   RESVERATROL 100 MG CAPS    Take 1 capsule by mouth daily. Take one tablet once a day   TAMSULOSIN (FLOMAX) 0.4 MG CAPS CAPSULE    Take 1 capsule (0.4 mg total) by mouth daily.  Modified Medications   No medications on file  Discontinued Medications   No medications on file  Review of Systems  Constitutional: Negative for fever, activity change, appetite change, fatigue and unexpected weight change.  HENT: Positive for hearing loss. Negative for ear discharge.   Eyes: Negative.   Respiratory: Negative.  Negative for shortness of breath.   Cardiovascular: Positive for leg swelling. Negative for chest pain.       Left carotid stenosis  Gastrointestinal:       Occasional heartburn which seems well controlled. History of hemorrhoids.  Endocrine: Negative.   Genitourinary:       History of kidney stones. Nocturia. Obstruction occurred after surgery 06/14/14. Has Foley Catheter.  Musculoskeletal: Positive for back pain and arthralgias. Negative for neck pain and neck stiffness.       Leg cramps in the past  Skin: Negative.   Neurological:       Episodes of dizziness. None recent. Had an episode of syncope after severe coughing.  Hematological: Negative.   Psychiatric/Behavioral: Negative.        Mild  insomnia.    Filed Vitals:   06/19/14 1548  BP: 164/90  Pulse: 92  Temp: 98.1 F (36.7 C)  TempSrc: Oral  Height: _0  (1.803 m)  Weight: 206 lb (93.441 kg)   Body mass index is 28.74 kg/(m^2).  Physical Exam  Constitutional: He is oriented to person, place, and time.  Overweight.  HENT:  Mild loss of hearing bilaterally. Bilateral cerumen impaction. 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  Eyes:  Corrective lenses.  Neck: No JVD present. No tracheal deviation present. No thyromegaly present.  Fresh healing incision of the left neck from repair of left carotid.  Cardiovascular: Normal rate, regular rhythm, normal heart sounds and intact distal pulses.  Exam reveals no gallop and no friction rub.   No murmur heard. Diminished pulse left carotid artery.  Pulmonary/Chest: Breath sounds normal. No respiratory distress. He has no wheezes. He has no rales. He exhibits no tenderness.  Abdominal: Soft. Bowel sounds are normal. He exhibits no distension and no mass. There is no tenderness.  Genitourinary:  Foley catheter with leg bag. The retaining clip for the catheter was placed too low on the leg and there is tension on the catheter that is painful to the patient.  Musculoskeletal: Normal range of motion. He exhibits no edema or tenderness.  Lymphadenopathy:    He has no cervical adenopathy.  Neurological: He is alert and oriented to person, place, and time. He has normal reflexes. No cranial nerve deficit. Coordination normal.  Skin: Skin is warm and dry. No rash noted. No erythema. No pallor.  Psychiatric: He has a normal mood and affect. His behavior is normal. Judgment and thought content normal.     Labs reviewed: Admission on 06/14/2014, Discharged on 06/15/2014  Component Date Value Ref Range Status  . WBC 06/15/2014 7.3  4.0 - 10.5 K/uL Final  . RBC 06/15/2014 3.62* 4.22 - 5.81 MIL/uL Final  . Hemoglobin 06/15/2014 11.9* 13.0 - 17.0 g/dL Final  . HCT  06/15/2014 35.4* 39.0 - 52.0 % Final  . MCV 06/15/2014 97.8  78.0 - 100.0 fL Final  . MCH 06/15/2014 32.9  26.0 - 34.0 pg Final  . MCHC 06/15/2014 33.6  30.0 - 36.0 g/dL Final  . RDW 06/15/2014 13.8  11.5 - 15.5 % Final  . Platelets 06/15/2014 181  150 - 400 K/uL Final  . Sodium 06/15/2014 138  135 - 145 mmol/L Final  . Potassium 06/15/2014 3.6  3.5 - 5.1 mmol/L Final  .  Chloride 06/15/2014 108  96 - 112 mmol/L Final  . CO2 06/15/2014 24  19 - 32 mmol/L Final  . Glucose, Bld 06/15/2014 98  70 - 99 mg/dL Final  . BUN 06/15/2014 6  6 - 23 mg/dL Final  . Creatinine, Ser 06/15/2014 0.78  0.50 - 1.35 mg/dL Final  . Calcium 06/15/2014 7.8* 8.4 - 10.5 mg/dL Final  . GFR calc non Af Amer 06/15/2014 83* >90 mL/min Final  . GFR calc Af Amer 06/15/2014 >90  >90 mL/min Final   Comment: (NOTE) The eGFR has been calculated using the CKD EPI equation. This calculation has not been validated in all clinical situations. eGFR's persistently <90 mL/min signify possible Chronic Kidney Disease.   . Anion gap 06/15/2014 6  5 - 15 Final  . WBC 06/14/2014 9.8  4.0 - 10.5 K/uL Final  . RBC 06/14/2014 3.88* 4.22 - 5.81 MIL/uL Final  . Hemoglobin 06/14/2014 12.6* 13.0 - 17.0 g/dL Final  . HCT 06/14/2014 38.0* 39.0 - 52.0 % Final  . MCV 06/14/2014 97.9  78.0 - 100.0 fL Final  . MCH 06/14/2014 32.5  26.0 - 34.0 pg Final  . MCHC 06/14/2014 33.2  30.0 - 36.0 g/dL Final  . RDW 06/14/2014 13.6  11.5 - 15.5 % Final  . Platelets 06/14/2014 172  150 - 400 K/uL Final  . Creatinine, Ser 06/14/2014 0.98  0.50 - 1.35 mg/dL Final  . GFR calc non Af Amer 06/14/2014 76* >90 mL/min Final  . GFR calc Af Amer 06/14/2014 88* >90 mL/min Final   Comment: (NOTE) The eGFR has been calculated using the CKD EPI equation. This calculation has not been validated in all clinical situations. eGFR's persistently <90 mL/min signify possible Chronic Kidney Disease.   Hospital Outpatient Visit on 06/06/2014  Component Date Value Ref  Range Status  . aPTT 06/06/2014 30  24 - 37 seconds Final  . WBC 06/06/2014 7.6  4.0 - 10.5 K/uL Final  . RBC 06/06/2014 4.55  4.22 - 5.81 MIL/uL Final  . Hemoglobin 06/06/2014 14.8  13.0 - 17.0 g/dL Final  . HCT 06/06/2014 43.0  39.0 - 52.0 % Final  . MCV 06/06/2014 94.5  78.0 - 100.0 fL Final  . MCH 06/06/2014 32.5  26.0 - 34.0 pg Final  . MCHC 06/06/2014 34.4  30.0 - 36.0 g/dL Final  . RDW 06/06/2014 13.1  11.5 - 15.5 % Final  . Platelets 06/06/2014 199  150 - 400 K/uL Final  . Sodium 06/06/2014 138  135 - 145 mmol/L Final  . Potassium 06/06/2014 4.1  3.5 - 5.1 mmol/L Final  . Chloride 06/06/2014 106  96 - 112 mmol/L Final  . CO2 06/06/2014 26  19 - 32 mmol/L Final  . Glucose, Bld 06/06/2014 99  70 - 99 mg/dL Final  . BUN 06/06/2014 10  6 - 23 mg/dL Final  . Creatinine, Ser 06/06/2014 1.00  0.50 - 1.35 mg/dL Final  . Calcium 06/06/2014 9.3  8.4 - 10.5 mg/dL Final  . Total Protein 06/06/2014 6.8  6.0 - 8.3 g/dL Final  . Albumin 06/06/2014 3.9  3.5 - 5.2 g/dL Final  . AST 06/06/2014 22  0 - 37 U/L Final  . ALT 06/06/2014 16  0 - 53 U/L Final  . Alkaline Phosphatase 06/06/2014 49  39 - 117 U/L Final  . Total Bilirubin 06/06/2014 0.7  0.3 - 1.2 mg/dL Final  . GFR calc non Af Amer 06/06/2014 69* >90 mL/min Final  . GFR calc Af Wyvonnia Lora  06/06/2014 80* >90 mL/min Final   Comment: (NOTE) The eGFR has been calculated using the CKD EPI equation. This calculation has not been validated in all clinical situations. eGFR's persistently <90 mL/min signify possible Chronic Kidney Disease.   . Anion gap 06/06/2014 6  5 - 15 Final  . Prothrombin Time 06/06/2014 13.6  11.6 - 15.2 seconds Final  . INR 06/06/2014 1.03  0.00 - 1.49 Final  . ABO/RH(D) 06/06/2014 A POS   Final  . Antibody Screen 06/06/2014 NEG   Final  . Sample Expiration 06/06/2014 06/20/2014   Final  . Color, Urine 06/06/2014 YELLOW  YELLOW Final  . APPearance 06/06/2014 CLOUDY* CLEAR Final  . Specific Gravity, Urine 06/06/2014  1.010  1.005 - 1.030 Final  . pH 06/06/2014 6.5  5.0 - 8.0 Final  . Glucose, UA 06/06/2014 NEGATIVE  NEGATIVE mg/dL Final  . Hgb urine dipstick 06/06/2014 MODERATE* NEGATIVE Final  . Bilirubin Urine 06/06/2014 NEGATIVE  NEGATIVE Final  . Ketones, ur 06/06/2014 NEGATIVE  NEGATIVE mg/dL Final  . Protein, ur 06/06/2014 NEGATIVE  NEGATIVE mg/dL Final  . Urobilinogen, UA 06/06/2014 0.2  0.0 - 1.0 mg/dL Final  . Nitrite 06/06/2014 NEGATIVE  NEGATIVE Final  . Leukocytes, UA 06/06/2014 NEGATIVE  NEGATIVE Final  . MRSA, PCR 06/06/2014 NEGATIVE  NEGATIVE Final  . Staphylococcus aureus 06/06/2014 NEGATIVE  NEGATIVE Final   Comment:        The Xpert SA Assay (FDA approved for NASAL specimens in patients over 55 years of age), is one component of a comprehensive surveillance program.  Test performance has been validated by [facility] for patients greater than or equal to 106 year old. It is not intended to diagnose infection nor to guide or monitor treatment.   . Squamous Epithelial / LPF 06/06/2014 RARE  RARE Final  . WBC, UA 06/06/2014 3-6  <3 WBC/hpf Final  . RBC / HPF 06/06/2014 21-50  <3 RBC/hpf Final  . Bacteria, UA 06/06/2014 RARE  RARE Final  . ABO/RH(D) 06/06/2014 A POS   Final  Appointment on 06/03/2014  Component Date Value Ref Range Status  . Glucose 06/03/2014 91  65 - 99 mg/dL Final  . BUN 06/03/2014 12  8 - 27 mg/dL Final  . Creatinine, Ser 06/03/2014 0.94  0.76 - 1.27 mg/dL Final  . GFR calc non Af Amer 06/03/2014 76  >59 mL/min/1.73 Final  . GFR calc Af Amer 06/03/2014 88  >59 mL/min/1.73 Final  . BUN/Creatinine Ratio 06/03/2014 13  10 - 22 Final  . Sodium 06/03/2014 138  134 - 144 mmol/L Final  . Potassium 06/03/2014 4.1  3.5 - 5.2 mmol/L Final  . Chloride 06/03/2014 101  97 - 108 mmol/L Final  . CO2 06/03/2014 24  18 - 29 mmol/L Final  . Calcium 06/03/2014 8.9  8.6 - 10.2 mg/dL Final  . Cholesterol, Total 06/03/2014 144  100 - 199 mg/dL Final  . Triglycerides  06/03/2014 78  0 - 149 mg/dL Final  . HDL 06/03/2014 44  >39 mg/dL Final   Comment: According to ATP-III Guidelines, HDL-C >59 mg/dL is considered a negative risk factor for CHD.   Marland Kitchen VLDL Cholesterol Cal 06/03/2014 16  5 - 40 mg/dL Final  . LDL Calculated 06/03/2014 84  0 - 99 mg/dL Final  . Chol/HDL Ratio 06/03/2014 3.3  0.0 - 5.0 ratio units Final   Comment:  T. Chol/HDL Ratio                                             Men  Women                               1/2 Avg.Risk  3.4    3.3                                   Avg.Risk  5.0    4.4                                2X Avg.Risk  9.6    7.1                                3X Avg.Risk 23.4   11.0   Office Visit on 05/15/2014  Component Date Value Ref Range Status  . BUN/Creatinine Ratio 05/15/2014 12.8   Final  Orders Only on 05/15/2014  Component Date Value Ref Range Status  . BUN 05/15/2014 11  6 - 23 mg/dL Final  . Creat 05/15/2014 0.86  0.50 - 1.35 mg/dL Final     Assessment/Plan 1. Carotid stenosis, left Recovering from surgery on 06/14/14. Wound is healing. Pain is under control.  2. Essential hypertension Mild elevation in the SBP. I suspect this is related to the catheter and anxiety. I did not change medication this visit. He will get home BP and bring to the next appt.  3. BPH (benign prostatic hypertrophy) with urinary obstruction - Ambulatory referral to Urology

## 2014-06-21 DIAGNOSIS — R339 Retention of urine, unspecified: Secondary | ICD-10-CM | POA: Diagnosis not present

## 2014-06-21 DIAGNOSIS — N400 Enlarged prostate without lower urinary tract symptoms: Secondary | ICD-10-CM | POA: Diagnosis not present

## 2014-06-28 ENCOUNTER — Encounter: Payer: Self-pay | Admitting: Surgery

## 2014-07-01 ENCOUNTER — Encounter: Payer: Self-pay | Admitting: Surgery

## 2014-07-01 ENCOUNTER — Ambulatory Visit (INDEPENDENT_AMBULATORY_CARE_PROVIDER_SITE_OTHER): Payer: Self-pay | Admitting: Surgery

## 2014-07-01 VITALS — BP 130/83 | HR 86 | Temp 97.9°F | Resp 16 | Ht 71.0 in | Wt 202.0 lb

## 2014-07-01 DIAGNOSIS — I6523 Occlusion and stenosis of bilateral carotid arteries: Secondary | ICD-10-CM

## 2014-07-01 DIAGNOSIS — Z48812 Encounter for surgical aftercare following surgery on the circulatory system: Secondary | ICD-10-CM

## 2014-07-01 NOTE — Addendum Note (Signed)
Addended by: Mena Goes on: 07/01/2014 04:29 PM   Modules accepted: Orders

## 2014-07-01 NOTE — Progress Notes (Signed)
The patient is here for his first postoperative visit.  On 06/14/2013 he underwent left carotid endarterectomy with bovine pericardial patch angioplasty.  Intraoperative findings included 85% stenosis.  The lesion was approximately 2.5 cm above the carotid bifurcation.  The patient had a very stiff neck making exposure difficult therefore a shunt was not placed.  Postoperatively the patient had issues with urinary retention and he was discharged to home with a catheter.  He remains neurologically intact other than a marginal mandibular neurapraxia.  He will follow up in 6 months with a repeat carotid duplex.

## 2014-07-03 ENCOUNTER — Encounter: Payer: Self-pay | Admitting: Internal Medicine

## 2014-07-03 ENCOUNTER — Ambulatory Visit (INDEPENDENT_AMBULATORY_CARE_PROVIDER_SITE_OTHER): Payer: Commercial Managed Care - HMO | Admitting: Internal Medicine

## 2014-07-03 VITALS — BP 138/82 | HR 68 | Temp 97.8°F | Ht 71.0 in | Wt 202.4 lb

## 2014-07-03 DIAGNOSIS — I10 Essential (primary) hypertension: Secondary | ICD-10-CM

## 2014-07-03 DIAGNOSIS — I6522 Occlusion and stenosis of left carotid artery: Secondary | ICD-10-CM

## 2014-07-03 MED ORDER — FLUTICASONE-SALMETEROL 250-50 MCG/DOSE IN AEPB: INHALATION_SPRAY | RESPIRATORY_TRACT | Status: DC

## 2014-07-03 NOTE — Progress Notes (Signed)
Patient ID: Alex [name], male   DOB: [birth year], 81 y.o.   MRN: [id number]    Facility  [facility]    Place of Service:   OFFICE   No Known Allergies  Chief Complaint  Patient presents with  . Medical Management of Chronic Issues    2 Week Follow up    HPI:  Essential hypertension: Elevated 2 weeks ago, but bounced back to normal.  Carotid stenosis, left: Status post surgery to left carotid. No bruit. Wound is healed.    Medications: Patient's Medications  New Prescriptions   No medications on file  Previous Medications   ASPIRIN 81 MG TABLET    Take 81 mg by mouth daily. Take one tablet once a day   ATORVASTATIN (LIPITOR) 10 MG TABLET    Take 10 mg by mouth daily.   B COMPLEX VITAMINS TABLET    Take 1 tablet by mouth daily.   CHOLECALCIFEROL (VITAMIN D) 1000 UNITS TABLET    Take 1,000 Units by mouth daily.    CINNAMON 500 MG TABS    Take 1 tablet by mouth daily.   CITRUS BERGAMOT PO    Take 1 capsule by mouth daily. Take one capsule once daily to lower cholesterol   COENZYME Q10 (CO Q 10) 100 MG CAPS    Take 1 capsule by mouth daily.   DIAZEPAM (VALIUM) 5 MG TABLET    take 1/2 tablet by mouth UP TO TWICE A DAY AS NEEDED FOR DIZZINESS   DOXEPIN (SINEQUAN) 25 MG CAPSULE    Take 25 mg by mouth at bedtime.    FISH OIL-OMEGA-3 FATTY ACIDS 1000 MG CAPSULE    Take 2 g by mouth daily. Take one tablet once a day for cholesterol   IBUPROFEN (ADVIL,MOTRIN) 200 MG TABLET    Take 200 mg by mouth every 6 (six) hours as needed for pain. Take 3-4 tablets twice a day for neck pain as needed   LORAZEPAM (ATIVAN) 1 MG TABLET    Take one tablet by mouth at bedtime as needed for rest   NUTRITIONAL SUPPLEMENTS (NUTRITIONAL SUPPLEMENT PO)    Synerflex. Take 2 tablets by mouth daily.   NUTRITIONAL SUPPLEMENTS (NUTRITIONAL SUPPLEMENT PO)    Ginger and Turmeric. Take 1 capsule by mouth daily.   OXYCODONE-ACETAMINOPHEN (PERCOCET/ROXICET) 5-325 MG PER TABLET    Take 1 tablet by mouth every 6 (six) hours as  needed.   POTASSIUM 99 MG TABS    Take 1 tablet by mouth daily. Take one tablet once a day for cramps   RAMIPRIL (ALTACE) 5 MG CAPSULE    Take 1 capsule (5 mg total) by mouth daily.   RESVERATROL 100 MG CAPS    Take 1 capsule by mouth daily. Take one tablet once a day   TAMSULOSIN (FLOMAX) 0.4 MG CAPS CAPSULE    Take 1 capsule (0.4 mg total) by mouth daily.  Modified Medications   Modified Medication Previous Medication   FLUTICASONE-SALMETEROL (ADVAIR DISKUS) 250-50 MCG/DOSE AEPB ADVAIR DISKUS 250-50 MCG/DOSE AEPB      inhale 1 puff by mouth twice a day    inhale 1 puff by mouth twice a day  Discontinued Medications   No medications on file     Review of Systems  Constitutional: Negative for fever, activity change, appetite change, fatigue and unexpected weight change.  HENT: Positive for hearing loss. Negative for ear discharge.   Eyes: Negative.   Respiratory: Negative.  Negative for shortness of breath.   Cardiovascular:  Positive for leg swelling. Negative for chest pain.       Left carotid stenosis  Gastrointestinal:       Occasional heartburn which seems well controlled. History of hemorrhoids.  Endocrine: Negative.   Genitourinary:       History of kidney stones. Nocturia. Obstruction occurred after surgery 06/14/14. Foley Catheter was removed and he is voiding well. Taking Flomax 2 daily.  Musculoskeletal: Positive for back pain and arthralgias. Negative for neck pain and neck stiffness.       Leg cramps in the past  Skin: Negative.   Neurological:       Episodes of dizziness. None recent. Had an episode of syncope after severe coughing.  Hematological: Negative.   Psychiatric/Behavioral: Negative.  Decreased concentration: healed. Has been released by surgeons.       Mild insomnia.    Filed Vitals:   07/03/14 1328  BP: 138/82  Pulse: 68  Temp: 97.8 F (36.6 C)  TempSrc: Oral  Height: 5' 11" (1.803 m)  Weight: 202 lb 6.4 oz (91.808 kg)   Body mass index is 28.24  kg/(m^2).  Physical Exam  Constitutional: He is oriented to person, place, and time.  Overweight.  HENT:  Mild loss of hearing bilaterally. 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  Eyes:  Corrective lenses.  Neck: No JVD present. No tracheal deviation present. No thyromegaly present.  Healedg incision of the left neck from repair of left carotid.  Cardiovascular: Normal rate, regular rhythm, normal heart sounds and intact distal pulses.  Exam reveals no gallop and no friction rub.   No murmur heard. Pulmonary/Chest: Breath sounds normal. No respiratory distress. He has no wheezes. He has no rales. He exhibits no tenderness.  Abdominal: Soft. Bowel sounds are normal. He exhibits no distension and no mass. There is no tenderness.  Musculoskeletal: Normal range of motion. He exhibits no edema or tenderness.  Lymphadenopathy:    He has no cervical adenopathy.  Neurological: He is alert and oriented to person, place, and time. He has normal reflexes. No cranial nerve deficit. Coordination normal.  Skin: Skin is warm and dry. No rash noted. No erythema. No pallor.  Psychiatric: He has a normal mood and affect. His behavior is normal. Judgment and thought content normal.     Labs reviewed: Admission on 06/14/2014, Discharged on 06/15/2014  Component Date Value Ref Range Status  . WBC 06/15/2014 7.3  4.0 - 10.5 K/uL Final  . RBC 06/15/2014 3.62* 4.22 - 5.81 MIL/uL Final  . Hemoglobin 06/15/2014 11.9* 13.0 - 17.0 g/dL Final  . HCT 06/15/2014 35.4* 39.0 - 52.0 % Final  . MCV 06/15/2014 97.8  78.0 - 100.0 fL Final  . MCH 06/15/2014 32.9  26.0 - 34.0 pg Final  . MCHC 06/15/2014 33.6  30.0 - 36.0 g/dL Final  . RDW 06/15/2014 13.8  11.5 - 15.5 % Final  . Platelets 06/15/2014 181  150 - 400 K/uL Final  . Sodium 06/15/2014 138  135 - 145 mmol/L Final  . Potassium 06/15/2014 3.6  3.5 - 5.1 mmol/L Final  . Chloride 06/15/2014 108  96 - 112 mmol/L Final  . CO2 06/15/2014 24  19 - 32  mmol/L Final  . Glucose, Bld 06/15/2014 98  70 - 99 mg/dL Final  . BUN 06/15/2014 6  6 - 23 mg/dL Final  . Creatinine, Ser 06/15/2014 0.78  0.50 - 1.35 mg/dL Final  . Calcium 06/15/2014 7.8* 8.4 - 10.5 mg/dL Final  . GFR  calc non Af Amer 06/15/2014 83* >90 mL/min Final  . GFR calc Af Amer 06/15/2014 >90  >90 mL/min Final   Comment: (NOTE) The eGFR has been calculated using the CKD EPI equation. This calculation has not been validated in all clinical situations. eGFR's persistently <90 mL/min signify possible Chronic Kidney Disease.   . Anion gap 06/15/2014 6  5 - 15 Final  . WBC 06/14/2014 9.8  4.0 - 10.5 K/uL Final  . RBC 06/14/2014 3.88* 4.22 - 5.81 MIL/uL Final  . Hemoglobin 06/14/2014 12.6* 13.0 - 17.0 g/dL Final  . HCT 06/14/2014 38.0* 39.0 - 52.0 % Final  . MCV 06/14/2014 97.9  78.0 - 100.0 fL Final  . MCH 06/14/2014 32.5  26.0 - 34.0 pg Final  . MCHC 06/14/2014 33.2  30.0 - 36.0 g/dL Final  . RDW 06/14/2014 13.6  11.5 - 15.5 % Final  . Platelets 06/14/2014 172  150 - 400 K/uL Final  . Creatinine, Ser 06/14/2014 0.98  0.50 - 1.35 mg/dL Final  . GFR calc non Af Amer 06/14/2014 76* >90 mL/min Final  . GFR calc Af Amer 06/14/2014 88* >90 mL/min Final   Comment: (NOTE) The eGFR has been calculated using the CKD EPI equation. This calculation has not been validated in all clinical situations. eGFR's persistently <90 mL/min signify possible Chronic Kidney Disease.   Hospital Outpatient Visit on 06/06/2014  Component Date Value Ref Range Status  . aPTT 06/06/2014 30  24 - 37 seconds Final  . WBC 06/06/2014 7.6  4.0 - 10.5 K/uL Final  . RBC 06/06/2014 4.55  4.22 - 5.81 MIL/uL Final  . Hemoglobin 06/06/2014 14.8  13.0 - 17.0 g/dL Final  . HCT 06/06/2014 43.0  39.0 - 52.0 % Final  . MCV 06/06/2014 94.5  78.0 - 100.0 fL Final  . MCH 06/06/2014 32.5  26.0 - 34.0 pg Final  . MCHC 06/06/2014 34.4  30.0 - 36.0 g/dL Final  . RDW 06/06/2014 13.1  11.5 - 15.5 % Final  . Platelets  06/06/2014 199  150 - 400 K/uL Final  . Sodium 06/06/2014 138  135 - 145 mmol/L Final  . Potassium 06/06/2014 4.1  3.5 - 5.1 mmol/L Final  . Chloride 06/06/2014 106  96 - 112 mmol/L Final  . CO2 06/06/2014 26  19 - 32 mmol/L Final  . Glucose, Bld 06/06/2014 99  70 - 99 mg/dL Final  . BUN 06/06/2014 10  6 - 23 mg/dL Final  . Creatinine, Ser 06/06/2014 1.00  0.50 - 1.35 mg/dL Final  . Calcium 06/06/2014 9.3  8.4 - 10.5 mg/dL Final  . Total Protein 06/06/2014 6.8  6.0 - 8.3 g/dL Final  . Albumin 06/06/2014 3.9  3.5 - 5.2 g/dL Final  . AST 06/06/2014 22  0 - 37 U/L Final  . ALT 06/06/2014 16  0 - 53 U/L Final  . Alkaline Phosphatase 06/06/2014 49  39 - 117 U/L Final  . Total Bilirubin 06/06/2014 0.7  0.3 - 1.2 mg/dL Final  . GFR calc non Af Amer 06/06/2014 69* >90 mL/min Final  . GFR calc Af Amer 06/06/2014 80* >90 mL/min Final   Comment: (NOTE) The eGFR has been calculated using the CKD EPI equation. This calculation has not been validated in all clinical situations. eGFR's persistently <90 mL/min signify possible Chronic Kidney Disease.   . Anion gap 06/06/2014 6  5 - 15 Final  . Prothrombin Time 06/06/2014 13.6  11.6 - 15.2 seconds Final  . INR 06/06/2014 1.03  0.00 - 1.49 Final  . ABO/RH(D) 06/06/2014 A POS   Final  . Antibody Screen 06/06/2014 NEG   Final  . Sample Expiration 06/06/2014 06/20/2014   Final  . Color, Urine 06/06/2014 YELLOW  YELLOW Final  . APPearance 06/06/2014 CLOUDY* CLEAR Final  . Specific Gravity, Urine 06/06/2014 1.010  1.005 - 1.030 Final  . pH 06/06/2014 6.5  5.0 - 8.0 Final  . Glucose, UA 06/06/2014 NEGATIVE  NEGATIVE mg/dL Final  . Hgb urine dipstick 06/06/2014 MODERATE* NEGATIVE Final  . Bilirubin Urine 06/06/2014 NEGATIVE  NEGATIVE Final  . Ketones, ur 06/06/2014 NEGATIVE  NEGATIVE mg/dL Final  . Protein, ur 06/06/2014 NEGATIVE  NEGATIVE mg/dL Final  . Urobilinogen, UA 06/06/2014 0.2  0.0 - 1.0 mg/dL Final  . Nitrite 06/06/2014 NEGATIVE  NEGATIVE  Final  . Leukocytes, UA 06/06/2014 NEGATIVE  NEGATIVE Final  . MRSA, PCR 06/06/2014 NEGATIVE  NEGATIVE Final  . Staphylococcus aureus 06/06/2014 NEGATIVE  NEGATIVE Final   Comment:        The Xpert SA Assay (FDA approved for NASAL specimens in patients over 36 years of age), is one component of a comprehensive surveillance program.  Test performance has been validated by [facility] for patients greater than or equal to 71 year old. It is not intended to diagnose infection nor to guide or monitor treatment.   . Squamous Epithelial / LPF 06/06/2014 RARE  RARE Final  . WBC, UA 06/06/2014 3-6  <3 WBC/hpf Final  . RBC / HPF 06/06/2014 21-50  <3 RBC/hpf Final  . Bacteria, UA 06/06/2014 RARE  RARE Final  . ABO/RH(D) 06/06/2014 A POS   Final  Appointment on 06/03/2014  Component Date Value Ref Range Status  . Glucose 06/03/2014 91  65 - 99 mg/dL Final  . BUN 06/03/2014 12  8 - 27 mg/dL Final  . Creatinine, Ser 06/03/2014 0.94  0.76 - 1.27 mg/dL Final  . GFR calc non Af Amer 06/03/2014 76  >59 mL/min/1.73 Final  . GFR calc Af Amer 06/03/2014 88  >59 mL/min/1.73 Final  . BUN/Creatinine Ratio 06/03/2014 13  10 - 22 Final  . Sodium 06/03/2014 138  134 - 144 mmol/L Final  . Potassium 06/03/2014 4.1  3.5 - 5.2 mmol/L Final  . Chloride 06/03/2014 101  97 - 108 mmol/L Final  . CO2 06/03/2014 24  18 - 29 mmol/L Final  . Calcium 06/03/2014 8.9  8.6 - 10.2 mg/dL Final  . Cholesterol, Total 06/03/2014 144  100 - 199 mg/dL Final  . Triglycerides 06/03/2014 78  0 - 149 mg/dL Final  . HDL 06/03/2014 44  >39 mg/dL Final   Comment: According to ATP-III Guidelines, HDL-C >59 mg/dL is considered a negative risk factor for CHD.   Marland Kitchen VLDL Cholesterol Cal 06/03/2014 16  5 - 40 mg/dL Final  . LDL Calculated 06/03/2014 84  0 - 99 mg/dL Final  . Chol/HDL Ratio 06/03/2014 3.3  0.0 - 5.0 ratio units Final   Comment:                                   T. Chol/HDL Ratio                                              Men  Women  1/2 Avg.Risk  3.4    3.3                                   Avg.Risk  5.0    4.4                                2X Avg.Risk  9.6    7.1                                3X Avg.Risk 23.4   11.0   Office Visit on 05/15/2014  Component Date Value Ref Range Status  . BUN/Creatinine Ratio 05/15/2014 12.8   Final  Orders Only on 05/15/2014  Component Date Value Ref Range Status  . BUN 05/15/2014 11  6 - 23 mg/dL Final  . Creat 05/15/2014 0.86  0.50 - 1.35 mg/dL Final     Assessment/Plan   1. Essential hypertension Controlled. Continue current medications.  2. Carotid stenosis, left Healed. Has been released by surgeons  Prior problem with obstructive uropathy has resolved.

## 2014-07-10 DIAGNOSIS — N400 Enlarged prostate without lower urinary tract symptoms: Secondary | ICD-10-CM | POA: Diagnosis not present

## 2014-07-10 DIAGNOSIS — R339 Retention of urine, unspecified: Secondary | ICD-10-CM | POA: Diagnosis not present

## 2014-08-30 ENCOUNTER — Other Ambulatory Visit: Payer: Self-pay | Admitting: *Deleted

## 2014-08-30 MED ORDER — DIAZEPAM 5 MG PO TABS: ORAL_TABLET | ORAL | Status: DC

## 2014-08-30 NOTE — Telephone Encounter (Signed)
[facility] [place] 

## 2014-09-12 DIAGNOSIS — N400 Enlarged prostate without lower urinary tract symptoms: Secondary | ICD-10-CM | POA: Diagnosis not present

## 2014-09-12 DIAGNOSIS — R339 Retention of urine, unspecified: Secondary | ICD-10-CM | POA: Diagnosis not present

## 2014-09-17 ENCOUNTER — Other Ambulatory Visit: Payer: Self-pay | Admitting: *Deleted

## 2014-09-17 MED ORDER — DOXEPIN HCL 25 MG PO CAPS: 25.0000 mg | ORAL_CAPSULE | Freq: Every day | ORAL | Status: DC

## 2014-09-17 NOTE — Telephone Encounter (Signed)
[facility] [place] 

## 2014-10-21 ENCOUNTER — Other Ambulatory Visit: Payer: Self-pay | Admitting: *Deleted

## 2014-10-21 MED ORDER — DIAZEPAM 5 MG PO TABS: ORAL_TABLET | ORAL | Status: DC

## 2014-10-21 NOTE — Telephone Encounter (Signed)
[facility] [place] 

## 2014-10-25 ENCOUNTER — Other Ambulatory Visit: Payer: Commercial Managed Care - HMO

## 2014-10-25 DIAGNOSIS — I10 Essential (primary) hypertension: Secondary | ICD-10-CM

## 2014-10-25 DIAGNOSIS — E785 Hyperlipidemia, unspecified: Secondary | ICD-10-CM

## 2014-10-26 LAB — BASIC METABOLIC PANEL
BUN/Creatinine Ratio: 18 (ref 10–22)
BUN: 17 mg/dL (ref 8–27)
CALCIUM: 9 mg/dL (ref 8.6–10.2)
CO2: 22 mmol/L (ref 18–29)
Chloride: 103 mmol/L (ref 97–108)
Creatinine, Ser: 0.93 mg/dL (ref 0.76–1.27)
GFR calc Af Amer: 89 mL/min/{1.73_m2} (ref >59)
GFR calc non Af Amer: 77 mL/min/{1.73_m2} (ref >59)
Glucose: 88 mg/dL (ref 65–99)
POTASSIUM: 4.2 mmol/L (ref 3.5–5.2)
SODIUM: 141 mmol/L (ref 134–144)

## 2014-10-26 LAB — LIPID PANEL
CHOL/HDL RATIO: 3.8 ratio (ref 0.0–5.0)
Cholesterol, Total: 165 mg/dL (ref 100–199)
HDL: 44 mg/dL (ref >39)
LDL CALC: 105 mg/dL — AB (ref 0–99)
TRIGLYCERIDES: 78 mg/dL (ref 0–149)
VLDL Cholesterol Cal: 16 mg/dL (ref 5–40)

## 2014-10-29 ENCOUNTER — Ambulatory Visit (INDEPENDENT_AMBULATORY_CARE_PROVIDER_SITE_OTHER): Payer: Commercial Managed Care - HMO | Admitting: Internal Medicine

## 2014-10-29 ENCOUNTER — Encounter: Payer: Self-pay | Admitting: Internal Medicine

## 2014-10-29 VITALS — BP 130/80 | HR 84 | Temp 98.7°F | Resp 20 | Ht 71.0 in | Wt 202.0 lb

## 2014-10-29 DIAGNOSIS — E669 Obesity, unspecified: Secondary | ICD-10-CM | POA: Diagnosis not present

## 2014-10-29 DIAGNOSIS — R609 Edema, unspecified: Secondary | ICD-10-CM

## 2014-10-29 DIAGNOSIS — I10 Essential (primary) hypertension: Secondary | ICD-10-CM

## 2014-10-29 DIAGNOSIS — I2510 Atherosclerotic heart disease of native coronary artery without angina pectoris: Secondary | ICD-10-CM | POA: Diagnosis not present

## 2014-10-29 DIAGNOSIS — E785 Hyperlipidemia, unspecified: Secondary | ICD-10-CM | POA: Diagnosis not present

## 2014-10-29 DIAGNOSIS — G4700 Insomnia, unspecified: Secondary | ICD-10-CM

## 2014-10-29 MED ORDER — LORAZEPAM 2 MG PO TABS: 2.0000 mg | ORAL_TABLET | Freq: Four times a day (QID) | ORAL | Status: DC | PRN

## 2014-10-29 MED ORDER — DIAZEPAM 5 MG PO TABS: ORAL_TABLET | ORAL | Status: DC

## 2014-10-29 MED ORDER — RAMIPRIL 5 MG PO CAPS: 5.0000 mg | ORAL_CAPSULE | Freq: Every day | ORAL | Status: DC

## 2014-10-29 MED ORDER — ATORVASTATIN CALCIUM 10 MG PO TABS: ORAL_TABLET | ORAL | Status: DC

## 2014-10-29 NOTE — Progress Notes (Signed)
Patient ID: Alex [name], male   DOB: December 09, 1931, 81 y.o.   MRN: [id number]    Facility  [facility]    Place of Service:   OFFICE    No Known Allergies  Chief Complaint  Patient presents with  . Medical Management of Chronic Issues    wants to increase Ativan,    HPI:  Obesity - patient has lost some weight since his last visit. He says he is intent on losing more weight.  Essential hypertension: Controlled  Hyperlipidemia: Controlled with atorvastatin  Atherosclerosis of native coronary artery of native heart without angina pectoris: Stable with no angina or palpitations  Edema: Trace amount. Stable  Insomnia: He would like to try getting off Sinequan by increasing lorazepam.    Medications: Patient's Medications  New Prescriptions   No medications on file  Previous Medications   ASPIRIN 81 MG TABLET    Take 81 mg by mouth daily. Take one tablet once a day   ATORVASTATIN (LIPITOR) 10 MG TABLET    Take 10 mg by mouth daily.   B COMPLEX VITAMINS TABLET    Take 1 tablet by mouth daily.   CHOLECALCIFEROL (VITAMIN D) 1000 UNITS TABLET    Take 1,000 Units by mouth daily.    CINNAMON 500 MG TABS    Take 1 tablet by mouth daily.   CITRUS BERGAMOT PO    Take 1 capsule by mouth daily. Take one capsule once daily to lower cholesterol   COENZYME Q10 (CO Q 10) 100 MG CAPS    Take 1 capsule by mouth daily.   DOXEPIN (SINEQUAN) 25 MG CAPSULE    Take 1 capsule (25 mg total) by mouth at bedtime.   FISH OIL-OMEGA-3 FATTY ACIDS 1000 MG CAPSULE    Take 2 g by mouth daily. Take one tablet once a day for cholesterol   FLUTICASONE-SALMETEROL (ADVAIR DISKUS) 250-50 MCG/DOSE AEPB    inhale 1 puff by mouth twice a day   IBUPROFEN (ADVIL,MOTRIN) 200 MG TABLET    Take 200 mg by mouth every 6 (six) hours as needed for pain. Take 3-4 tablets twice a day for neck pain as needed   LORAZEPAM (ATIVAN) 1 MG TABLET    Take one tablet by mouth at bedtime as needed for rest   NUTRITIONAL SUPPLEMENTS (NUTRITIONAL  SUPPLEMENT PO)    Synerflex. Take 2 tablets by mouth daily.   NUTRITIONAL SUPPLEMENTS (NUTRITIONAL SUPPLEMENT PO)    Ginger and Turmeric. Take 1 capsule by mouth daily.   OXYCODONE-ACETAMINOPHEN (PERCOCET/ROXICET) 5-325 MG PER TABLET    Take 1 tablet by mouth every 6 (six) hours as needed.   POTASSIUM 99 MG TABS    Take 1 tablet by mouth daily. Take one tablet once a day for cramps   RAMIPRIL (ALTACE) 5 MG CAPSULE    Take 1 capsule (5 mg total) by mouth daily.   RESVERATROL 100 MG CAPS    Take 1 capsule by mouth daily. Take one tablet once a day   TAMSULOSIN (FLOMAX) 0.4 MG CAPS CAPSULE    Take 1 capsule (0.4 mg total) by mouth daily.  Modified Medications   Modified Medication Previous Medication   DIAZEPAM (VALIUM) 5 MG TABLET diazepam (VALIUM) 5 MG tablet      Take 1/2 tablet by mouth up to twice daily as needed    Take 1/2 tablet by mouth up to twice daily as needed  Discontinued Medications   No medications on file     Review of  Systems  Constitutional: Negative for fever, activity change, appetite change, fatigue and unexpected weight change.  HENT: Positive for hearing loss. Negative for ear discharge.   Eyes: Negative.   Respiratory: Negative.  Negative for shortness of breath.   Cardiovascular: Positive for leg swelling. Negative for chest pain.       Left carotid stenosis  Gastrointestinal:       Occasional heartburn which seems well controlled. History of hemorrhoids.  Endocrine: Negative.   Genitourinary:       History of kidney stones. Nocturia. Obstruction occurred after surgery 06/14/14. Foley Catheter was removed and he is voiding well. Taking Flomax 2 daily.  Musculoskeletal: Positive for back pain and arthralgias. Negative for neck pain and neck stiffness.       Leg cramps in the past  Skin: Negative.   Neurological:       Episodes of dizziness. None recent. Had an episode of syncope after severe coughing.  Hematological: Negative.   Psychiatric/Behavioral:  Positive for sleep disturbance. Self-injury: stop Sinequan. Decreased concentration: healed. Has been released by surgeons.       Mild insomnia.    Filed Vitals:   10/29/14 1428  BP: 130/80  Pulse: 84  Temp: 98.7 F (37.1 C)  TempSrc: Oral  Resp: 20  Height: 5\' 11"  (1.803 m)  Weight: 202 lb (91.627 kg)  SpO2: 96%   Body mass index is 28.19 kg/(m^2).  Physical Exam  Constitutional: He is oriented to person, place, and time.  Overweight.  HENT:  Mild loss of hearing bilaterally. 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  Eyes:  Corrective lenses.  Neck: No JVD present. No tracheal deviation present. No thyromegaly present.  Healed incision of the left neck from repair of left carotid.  Cardiovascular: Normal rate, regular rhythm, normal heart sounds and intact distal pulses.  Exam reveals no gallop and no friction rub.   No murmur heard. Pulmonary/Chest: Breath sounds normal. No respiratory distress. He has no wheezes. He has no rales. He exhibits no tenderness.  Abdominal: Soft. Bowel sounds are normal. He exhibits no distension and no mass. There is no tenderness.  Musculoskeletal: Normal range of motion. He exhibits no edema or tenderness.  Lymphadenopathy:    He has no cervical adenopathy.  Neurological: He is alert and oriented to person, place, and time. He has normal reflexes. No cranial nerve deficit. Coordination normal.  Skin: Skin is warm and dry. No rash noted. No erythema. No pallor.  Psychiatric: He has a normal mood and affect. His behavior is normal. Judgment and thought content normal.     Labs reviewed: Appointment on 10/25/2014  Component Date Value Ref Range Status  . Cholesterol, Total 10/25/2014 165  100 - 199 mg/dL Final  . Triglycerides 10/25/2014 78  0 - 149 mg/dL Final  . HDL 10/25/2014 44  >39 mg/dL Final   Comment: According to ATP-III Guidelines, HDL-C >59 mg/dL is considered a negative risk factor for CHD.   Marland Kitchen VLDL Cholesterol Cal  10/25/2014 16  5 - 40 mg/dL Final  . LDL Calculated 10/25/2014 105* 0 - 99 mg/dL Final  . Chol/HDL Ratio 10/25/2014 3.8  0.0 - 5.0 ratio units Final   Comment:                                   T. Chol/HDL Ratio  Men  Women                               1/2 Avg.Risk  3.4    3.3                                   Avg.Risk  5.0    4.4                                2X Avg.Risk  9.6    7.1                                3X Avg.Risk 23.4   11.0   . Glucose 10/25/2014 88  65 - 99 mg/dL Final  . BUN 10/25/2014 17  8 - 27 mg/dL Final  . Creatinine, Ser 10/25/2014 0.93  0.76 - 1.27 mg/dL Final  . GFR calc non Af Amer 10/25/2014 77  >59 mL/min/1.73 Final  . GFR calc Af Amer 10/25/2014 89  >59 mL/min/1.73 Final  . BUN/Creatinine Ratio 10/25/2014 18  10 - 22 Final  . Sodium 10/25/2014 141  134 - 144 mmol/L Final  . Potassium 10/25/2014 4.2  3.5 - 5.2 mmol/L Final  . Chloride 10/25/2014 103  97 - 108 mmol/L Final  . CO2 10/25/2014 22  18 - 29 mmol/L Final  . Calcium 10/25/2014 9.0  8.6 - 10.2 mg/dL Final     Assessment/Plan  1. Obesity Continue weight loss effort  2. Essential hypertension - ramipril (ALTACE) 5 MG capsule; Take 1 capsule (5 mg total) by mouth daily.  Dispense: 90 capsule; Refill: 3 - Comprehensive metabolic panel; Future  3. Hyperlipidemia - atorvastatin (LIPITOR) 10 MG tablet; One daily to lower cholesterol  Dispense: 90 tablet; Refill: 3 - Lipid panel; Future  4. Atherosclerosis of native coronary artery of native heart without angina pectoris stable  5. Edema stable  6. Insomnia Stop Sinequan - LORazepam (ATIVAN) 2 MG tablet; Take 1 tablet (2 mg total) by mouth every 6 (six) hours as needed for anxiety.  Dispense: 30 tablet; Refill: 0

## 2014-11-15 ENCOUNTER — Other Ambulatory Visit: Payer: Self-pay | Admitting: *Deleted

## 2014-11-15 MED ORDER — DIAZEPAM 5 MG PO TABS: ORAL_TABLET | ORAL | Status: DC

## 2014-11-15 NOTE — Telephone Encounter (Signed)
[facility] [place] 

## 2014-12-09 ENCOUNTER — Other Ambulatory Visit: Payer: Self-pay | Admitting: *Deleted

## 2014-12-09 MED ORDER — DIAZEPAM 5 MG PO TABS: ORAL_TABLET | ORAL | Status: DC

## 2014-12-09 NOTE — Telephone Encounter (Signed)
[facility] [place] 

## 2014-12-13 ENCOUNTER — Telehealth: Payer: Self-pay

## 2014-12-13 ENCOUNTER — Other Ambulatory Visit: Payer: Self-pay | Admitting: Internal Medicine

## 2014-12-13 NOTE — Telephone Encounter (Signed)
Patient left message on triage voice mail [phone] 12/12/14 wants [place] to do referral for x-ray. Left message on patient voice mail 8:15 on 12/13/14, need more information

## 2014-12-27 ENCOUNTER — Encounter: Payer: Self-pay | Admitting: Family

## 2014-12-30 ENCOUNTER — Ambulatory Visit (INDEPENDENT_AMBULATORY_CARE_PROVIDER_SITE_OTHER): Payer: Commercial Managed Care - HMO | Admitting: Family

## 2014-12-30 ENCOUNTER — Ambulatory Visit (HOSPITAL_COMMUNITY)
Admission: RE | Admit: 2014-12-30 | Discharge: 2014-12-30 | Disposition: A | Discharge status: Home / Self Care | Payer: Commercial Managed Care - HMO | Source: Ambulatory Visit | Attending: Family | Admitting: Family

## 2014-12-30 ENCOUNTER — Encounter: Payer: Self-pay | Admitting: Family

## 2014-12-30 VITALS — BP 139/77 | HR 67 | Temp 98.5°F | Resp 16 | Ht 71.0 in | Wt 202.0 lb

## 2014-12-30 DIAGNOSIS — I6523 Occlusion and stenosis of bilateral carotid arteries: Secondary | ICD-10-CM

## 2014-12-30 DIAGNOSIS — Z9889 Other specified postprocedural states: Secondary | ICD-10-CM | POA: Diagnosis not present

## 2014-12-30 DIAGNOSIS — Z48812 Encounter for surgical aftercare following surgery on the circulatory system: Secondary | ICD-10-CM | POA: Insufficient documentation

## 2014-12-30 DIAGNOSIS — I6521 Occlusion and stenosis of right carotid artery: Secondary | ICD-10-CM | POA: Diagnosis not present

## 2014-12-30 NOTE — Progress Notes (Signed)
Established Carotid Patient   History of Present Illness  Alex [name] is a 81 y.o. male patient of Dr. Trula Slade  who is s/p left carotid endarterectomy with bovine pericardial patch angioplasty on 06/14/2014. Intraoperative findings included 85% stenosis. The lesion was approximately 2.5 cm above the carotid bifurcation. The patient had a very stiff neck making exposure difficult therefore a shunt was not placed.  Postoperatively the patient had issues with urinary retention and he was discharged to home with a catheter. He remains neurologically intact other than a marginal mandibular neurapraxia.  The patient denies any history of TIA or stroke symptoms, specifically the patient denies a history of amaurosis fugax or monocular blindness, denies a history unilateral  of facial drooping, denies a history of hemiplegia, and denies a history of receptive or expressive aphasia.    The patient denies New Medical or Surgical History.  Pt Diabetic: no Pt smoker: former smoker, quit in the 1980's  Pt meds include: Statin : yes ASA: no, he stopped taking, states he did not let his PCP know, states he was bruising easily Other anticoagulants/antiplatelets: no   Past Medical History  Diagnosis Date  . Urinary frequency   . 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 Cellulitis and abscess of unspecified site   . Obesity, unspecified   . Unspecified vitamin D deficiency   . Palpitations   . Hypertrophy of prostate with urinary obstruction and other lower urinary tract symptoms (LUTS)   . Insomnia, unspecified   . Acquired deformity of nose   . Cervicalgia   . Dizziness and giddiness   . Other premature beats   . Special screening for malignant neoplasm of prostate   . Slowing of urinary stream   . Shortness of breath   . Encounter for long-term (current) use of other medications   .  Esophagitis, unspecified   . Other and unspecified hyperlipidemia   . Calculus of kidney   . Unspecified essential hypertension   . Old myocardial infarction   . Coronary atherosclerosis of unspecified type of vessel, native or graft   . Internal hemorrhoids without mention of complication   . Carotid artery disease   . Hyperlipidemia   . CHF (congestive heart failure)     Social History Social History  Substance Use Topics  . Smoking status: Former Smoker -- 1.50 packs/day for 22 years    Types: Cigarettes    Quit date: 05/03/1973  . Smokeless tobacco: Never Used  . Alcohol Use: 3.6 oz/week    3 Glasses of wine, 3 Cans of beer per week    Family History Family History  Problem Relation Age of Onset  . Cancer Mother     LIVER  . Cancer Brother     PROSTATE  . Cancer Daughter     Surgical History Past Surgical History  Procedure Laterality Date  . Tonsillectomy  1940  . Excisional hemorrhoidectomy  1981  . Cabg x 4  2000  . Lesion from forehead,(r) ear (l) arm      DR Almira Coaster  . Lithotripsy  2005    Dr Almira Coaster  . Esophagogastroduodenoscopy endoscopy  2007    Dr Lajoyce Corners, with biopsy  . Colonoscopy  2007    Dr Lajoyce Corners, normal  . Cardiac catheterization    . Coronary artery bypass graft      2000  . Endarterectomy Left 06/14/2014  Procedure: LEFT CAROTID ENDARTERECTOMY WITH VASCUGUARD PATCH ANGIOPLASTY;  Surgeon: Serafina Mitchell, MD;  Location: [facility] OR;  Service: Vascular;  Laterality: Left;  . Carotid endarterectomy Left 06-14-14    CE    No Known Allergies  Current Outpatient Prescriptions  Medication Sig Dispense Refill  . aspirin 81 MG tablet Take 81 mg by mouth daily. Take one tablet once a day    . atorvastatin (LIPITOR) 10 MG tablet One daily to lower cholesterol 90 tablet 3  . b complex vitamins tablet Take 1 tablet by mouth daily.    . cholecalciferol (VITAMIN D) 1000 UNITS tablet Take 1,000 Units by mouth daily.     . Cinnamon 500 MG TABS Take 1  tablet by mouth daily.    Marland Kitchen CITRUS BERGAMOT PO Take 1 capsule by mouth daily. Take one capsule once daily to lower cholesterol    . Coenzyme Q10 (CO Q 10) 100 MG CAPS Take 1 capsule by mouth daily.    . diazepam (VALIUM) 5 MG tablet Take 1/2 tablet by mouth up to twice daily as needed 30 tablet 0  . fish oil-omega-3 fatty acids 1000 MG capsule Take 2 g by mouth daily. Take one tablet once a day for cholesterol    . Fluticasone-Salmeterol (ADVAIR DISKUS) 250-50 MCG/DOSE AEPB inhale 1 puff by mouth twice a day 60 each 5  . ibuprofen (ADVIL,MOTRIN) 200 MG tablet Take 200 mg by mouth every 6 (six) hours as needed for pain. Take 3-4 tablets twice a day for neck pain as needed    . LORazepam (ATIVAN) 2 MG tablet take 1 tablet by mouth every 6 hours if needed for anxiety 30 tablet 0  . Nutritional Supplements (NUTRITIONAL SUPPLEMENT PO) Synerflex. Take 2 tablets by mouth daily.    . Nutritional Supplements (NUTRITIONAL SUPPLEMENT PO) Ginger and Turmeric. Take 1 capsule by mouth daily.    Marland Kitchen oxyCODONE-acetaminophen (PERCOCET/ROXICET) 5-325 MG per tablet Take 1 tablet by mouth every 6 (six) hours as needed. 30 tablet 0  . Potassium 99 MG TABS Take 1 tablet by mouth daily. Take one tablet once a day for cramps    . ramipril (ALTACE) 5 MG capsule Take 1 capsule (5 mg total) by mouth daily. 90 capsule 3  . RESVERATROL 100 MG CAPS Take 1 capsule by mouth daily. Take one tablet once a day    . tamsulosin (FLOMAX) 0.4 MG CAPS capsule Take 1 capsule (0.4 mg total) by mouth daily. (Patient taking differently: Take 0.4 mg by mouth 2 (two) times daily. ) 30 capsule 1   No current facility-administered medications for this visit.    Review of Systems : See HPI for pertinent positives and negatives.  Physical Examination  Filed Vitals:   12/30/14 1336 12/30/14 1339 12/30/14 1342  BP: 145/87 132/81 139/77  Pulse: 81 77 67  Temp:  98.5 F (36.9 C)   TempSrc:  Oral   Resp:  16   Height:  5\' 11"  (1.803 m)    Weight:  202 lb (91.627 kg)   SpO2:  96%    Body mass index is 28.19 kg/(m^2).  General: WDWN male in NAD GAIT: normal Eyes: PERRLA Pulmonary:  Non-labored, CTAB, no  Rales, no rhonchi, & no wheezing.  Cardiac: regular rhythm,  no detected murmur.  VASCULAR EXAM Carotid Bruits Right Left   Negative Negative    Aorta is not palpable. Radial pulses are 2+ palpable and equal.  LE Pulses Right Left       POPLITEAL  not palpable   not palpable       POSTERIOR TIBIAL   palpable    palpable        DORSALIS PEDIS      ANTERIOR TIBIAL  palpable   palpable     Gastrointestinal: soft, nontender, BS WNL, no r/g,  no palpable masses.  Musculoskeletal: no muscle atrophy/wasting. M/S 5/5 throughout, extremities without ischemic changes.  Neurologic: A&O X 3; Appropriate Affect, Speech is normal CN 2-12 intact except mild deviation to the right of tongue, pain and light touch intact in extremities, Motor exam as listed above.   Non-Invasive Vascular Imaging CAROTID DUPLEX 12/30/2014   CEREBROVASCULAR DUPLEX EVALUATION    INDICATION: Carotid artery disease    PREVIOUS INTERVENTION(S): Left carotid endarterectomy 06/14/2014    DUPLEX EXAM: Carotid duplex    RIGHT  LEFT  Peak Systolic Velocities (cm/s) End Diastolic Velocities (cm/s) Plaque LOCATION Peak Systolic Velocities (cm/s) End Diastolic Velocities (cm/s) Plaque  96 14 - CCA PROXIMAL 92 15 -  91 15 HM CCA MID 115 22 HT  82 16 - CCA DISTAL 87 18 HT  126 13 - ECA 97 19 -  58 12 - ICA PROXIMAL 57 9 -  82 20 HT ICA MID 86 26 -  90 27 - ICA DISTAL 62 17 -    .98 ICA / CCA Ratio (PSV) N/A  Antegrade Vertebral Flow Antegrade  614 Brachial Systolic Pressure (mmHg) 431  Triphasic Brachial Artery Waveforms Triphasic    Plaque Morphology:  HM = Homogeneous, HT = Heterogeneous, CP = Calcific Plaque, SP =  Smooth Plaque, IP = Irregular Plaque  ADDITIONAL FINDINGS:     IMPRESSION: 1. 1 - 39% right internal carotid artery stenosis. 2. Patent left carotid endarterectomy site with no evidence for restenosis.    Compared to the previous exam:  No prior exam performed here.      Assessment: Alex [name] is a 81 y.o. male who is s/p left carotid endarterectomy with bovine pericardial patch angioplasty on 06/14/2014. He has no history of stroke or TIA. Intraoperative findings included 85% stenosis. The lesion was approximately 2.5 cm above the carotid bifurcation.  Today's carotid Duplex suggests 1 - 39% right internal carotid artery stenosis and a patent left carotid endarterectomy site with no evidence for restenosis.    Plan: Follow-up in 6 months with Carotid Duplex.   I discussed in depth with the patient the nature of atherosclerosis, and emphasized the importance of maximal medical management including strict control of blood pressure, blood glucose, and lipid levels, obtaining regular exercise, and continued cessation of smoking.  The patient is aware that without maximal medical management the underlying atherosclerotic disease process will progress, limiting the benefit of any interventions. The patient was given information about stroke prevention and what symptoms should prompt the patient to seek immediate medical care. Thank you for allowing [place] to participate in this patient's care.  Clemon Chambers, RN, MSN, FNP-C [facility] Office: [facility] Physician: Trula Slade  12/30/2014 1:39 PM

## 2014-12-30 NOTE — Patient Instructions (Signed)
Stroke Prevention Some medical conditions and behaviors are associated with an increased chance of having a stroke. You may prevent a stroke by making healthy choices and managing medical conditions. HOW CAN I REDUCE MY RISK OF HAVING A STROKE?   Stay physically active. Get at least 30 minutes of activity on most or all days.  Do not smoke. It may also be helpful to avoid exposure to secondhand smoke.  Limit alcohol use. Moderate alcohol use is considered to be:  No more than 2 drinks per day for men.  No more than 1 drink per day for nonpregnant women.  Eat healthy foods. This involves:  Eating 5 or more servings of fruits and vegetables a day.  Making dietary changes that address high blood pressure (hypertension), high cholesterol, diabetes, or obesity.  Manage your cholesterol levels.  Making food choices that are high in fiber and low in saturated fat, trans fat, and cholesterol may control cholesterol levels.  Take any prescribed medicines to control cholesterol as directed by your health care provider.  Manage your diabetes.  Controlling your carbohydrate and sugar intake is recommended to manage diabetes.  Take any prescribed medicines to control diabetes as directed by your health care provider.  Control your hypertension.  Making food choices that are low in salt (sodium), saturated fat, trans fat, and cholesterol is recommended to manage hypertension.  Take any prescribed medicines to control hypertension as directed by your health care provider.  Maintain a healthy weight.  Reducing calorie intake and making food choices that are low in sodium, saturated fat, trans fat, and cholesterol are recommended to manage weight.  Stop drug abuse.  Avoid taking birth control pills.  Talk to your health care provider about the risks of taking birth control pills if you are over 35 years old, smoke, get migraines, or have ever had a blood clot.  Get evaluated for sleep  disorders (sleep apnea).  Talk to your health care provider about getting a sleep evaluation if you snore a lot or have excessive sleepiness.  Take medicines only as directed by your health care provider.  For some people, aspirin or blood thinners (anticoagulants) are helpful in reducing the risk of forming abnormal blood clots that can lead to stroke. If you have the irregular heart rhythm of atrial fibrillation, you should be on a blood thinner unless there is a good reason you cannot take them.  Understand all your medicine instructions.  Make sure that other conditions (such as anemia or atherosclerosis) are addressed. SEEK IMMEDIATE MEDICAL CARE IF:   You have sudden weakness or numbness of the face, arm, or leg, especially on one side of the body.  Your face or eyelid droops to one side.  You have sudden confusion.  You have trouble speaking (aphasia) or understanding.  You have sudden trouble seeing in one or both eyes.  You have sudden trouble walking.  You have dizziness.  You have a loss of balance or coordination.  You have a sudden, severe headache with no known cause.  You have new chest pain or an irregular heartbeat. Any of these symptoms may represent a serious problem that is an emergency. Do not wait to see if the symptoms will go away. Get medical help at once. Call your local emergency services (911 in [place] Do not drive yourself to the hospital. Document Released: 05/27/2004 Document Revised: 09/03/2013 Document Reviewed: 10/20/2012 Exit[employer] Patient Information 2015 [employer]. This information is not intended to replace advice given   to you by your health care provider. Make sure you discuss any questions you have with your health care provider.  

## 2014-12-30 NOTE — Progress Notes (Signed)
Filed Vitals:   12/30/14 1336 12/30/14 1339 12/30/14 1342  BP: 145/87 132/81 139/77  Pulse: 81 77 67  Temp:  98.5 F (36.9 C)   TempSrc:  Oral   Resp:  16   Height:  5\' 11"  (1.803 m)   Weight:  202 lb (91.627 kg)   SpO2:  96%

## 2015-01-14 ENCOUNTER — Other Ambulatory Visit: Payer: Self-pay | Admitting: *Deleted

## 2015-01-14 MED ORDER — LORAZEPAM 2 MG PO TABS: ORAL_TABLET | ORAL | Status: DC

## 2015-01-14 NOTE — Telephone Encounter (Signed)
[facility] [place] 

## 2015-02-07 DIAGNOSIS — H524 Presbyopia: Secondary | ICD-10-CM | POA: Diagnosis not present

## 2015-02-07 DIAGNOSIS — H25813 Combined forms of age-related cataract, bilateral: Secondary | ICD-10-CM | POA: Diagnosis not present

## 2015-02-07 DIAGNOSIS — H04123 Dry eye syndrome of bilateral lacrimal glands: Secondary | ICD-10-CM | POA: Diagnosis not present

## 2015-02-07 DIAGNOSIS — H43813 Vitreous degeneration, bilateral: Secondary | ICD-10-CM | POA: Diagnosis not present

## 2015-02-21 ENCOUNTER — Other Ambulatory Visit: Payer: Commercial Managed Care - HMO

## 2015-02-21 DIAGNOSIS — I10 Essential (primary) hypertension: Secondary | ICD-10-CM

## 2015-02-21 DIAGNOSIS — E785 Hyperlipidemia, unspecified: Secondary | ICD-10-CM | POA: Diagnosis not present

## 2015-02-22 LAB — COMPREHENSIVE METABOLIC PANEL
A/G RATIO: 1.6 (ref 1.1–2.5)
ALBUMIN: 4.1 g/dL (ref 3.5–4.7)
ALK PHOS: 51 IU/L (ref 39–117)
ALT: 17 IU/L (ref 0–44)
AST: 19 IU/L (ref 0–40)
BILIRUBIN TOTAL: 0.6 mg/dL (ref 0.0–1.2)
BUN / CREAT RATIO: 10 (ref 10–22)
BUN: 11 mg/dL (ref 8–27)
CHLORIDE: 102 mmol/L (ref 97–106)
CO2: 22 mmol/L (ref 18–29)
Calcium: 8.7 mg/dL (ref 8.6–10.2)
Creatinine, Ser: 1.06 mg/dL (ref 0.76–1.27)
GFR calc non Af Amer: 65 mL/min/{1.73_m2} (ref >59)
GFR, EST AFRICAN AMERICAN: 76 mL/min/{1.73_m2} (ref >59)
GLOBULIN, TOTAL: 2.6 g/dL (ref 1.5–4.5)
Glucose: 91 mg/dL (ref 65–99)
Potassium: 4 mmol/L (ref 3.5–5.2)
SODIUM: 139 mmol/L (ref 136–144)
Total Protein: 6.7 g/dL (ref 6.0–8.5)

## 2015-02-22 LAB — LIPID PANEL
CHOLESTEROL TOTAL: 159 mg/dL (ref 100–199)
Chol/HDL Ratio: 3.9 ratio units (ref 0.0–5.0)
HDL: 41 mg/dL (ref >39)
LDL Calculated: 99 mg/dL (ref 0–99)
TRIGLYCERIDES: 95 mg/dL (ref 0–149)
VLDL CHOLESTEROL CAL: 19 mg/dL (ref 5–40)

## 2015-02-25 ENCOUNTER — Encounter: Payer: Self-pay | Admitting: Internal Medicine

## 2015-02-25 ENCOUNTER — Ambulatory Visit (INDEPENDENT_AMBULATORY_CARE_PROVIDER_SITE_OTHER): Payer: Commercial Managed Care - HMO | Admitting: Internal Medicine

## 2015-02-25 VITALS — BP 122/70 | HR 87 | Temp 98.7°F | Ht 71.0 in | Wt 205.8 lb

## 2015-02-25 DIAGNOSIS — I10 Essential (primary) hypertension: Secondary | ICD-10-CM | POA: Diagnosis not present

## 2015-02-25 DIAGNOSIS — N138 Other obstructive and reflux uropathy: Secondary | ICD-10-CM

## 2015-02-25 DIAGNOSIS — N401 Enlarged prostate with lower urinary tract symptoms: Secondary | ICD-10-CM | POA: Diagnosis not present

## 2015-02-25 DIAGNOSIS — I2510 Atherosclerotic heart disease of native coronary artery without angina pectoris: Secondary | ICD-10-CM

## 2015-02-25 DIAGNOSIS — E785 Hyperlipidemia, unspecified: Secondary | ICD-10-CM | POA: Diagnosis not present

## 2015-02-25 NOTE — Progress Notes (Signed)
Patient ID: Alex [name], male   DOB: 06-05-1931, 81 y.o.   MRN: [id number]    Facility  [facility]    Place of Service:   OFFICE    No Known Allergies  Chief Complaint  Patient presents with  . Medical Management of Chronic Issues    4 month follow-up, discuss labs (copy printed)    HPI:   Since he had surgery, he is having at least 3 stools daily. No pain or blood. Has borborygmi. Had carotid endarterectomy in Feb 2016.  He is wondering if she should see cardiologist again for review of his conditions. Denies chest pain or palpitations.  Essential hypertension - controlled  Atherosclerosis of native coronary artery of native heart without angina pectoris - Plan: EKG 12-Lead at next visit  Hyperlipidemia - controlled  BPH (benign prostatic hypertrophy) with urinary obstruction - hesitation. No discomfort. No bladder distention.    Past Surgical History  Procedure Laterality Date  . Tonsillectomy  1940  . Excisional hemorrhoidectomy  1981  . Cabg x 4  2000  . Lesion from forehead,(r) ear (l) arm      DR Almira Coaster  . Lithotripsy  2005    Dr Almira Coaster  . Esophagogastroduodenoscopy endoscopy  2007    Dr Lajoyce Corners, with biopsy  . Colonoscopy  2007    Dr Lajoyce Corners, normal  . Cardiac catheterization    . Coronary artery bypass graft      2000  . Endarterectomy Left 06/14/2014    Procedure: LEFT CAROTID ENDARTERECTOMY WITH VASCUGUARD PATCH ANGIOPLASTY;  Surgeon: Serafina Mitchell, MD;  Location: [facility] OR;  Service: Vascular;  Laterality: Left;  . Carotid endarterectomy Left 06-14-14    CE         Medications: Patient's Medications  New Prescriptions   No medications on file  Previous Medications   ASPIRIN 81 MG TABLET    Take 81 mg by mouth daily. Take one tablet once a day   ATORVASTATIN (LIPITOR) 10 MG TABLET    One daily to lower cholesterol   B COMPLEX VITAMINS TABLET    Take 1 tablet by mouth daily.   CHOLECALCIFEROL (VITAMIN D) 1000 UNITS TABLET    Take 1,000 Units by mouth  daily.    CINNAMON 500 MG TABS    Take 1 tablet by mouth daily.   CITRUS BERGAMOT PO    Take 1 capsule by mouth daily. Take one capsule once daily to lower cholesterol   COENZYME Q10 (CO Q 10) 100 MG CAPS    Take 1 capsule by mouth daily.   DIAZEPAM (VALIUM) 5 MG TABLET    Take 1/2 tablet by mouth up to twice daily as needed   FISH OIL-OMEGA-3 FATTY ACIDS 1000 MG CAPSULE    Take 2 g by mouth daily. Take one tablet once a day for cholesterol   FLUTICASONE-SALMETEROL (ADVAIR DISKUS) 250-50 MCG/DOSE AEPB    inhale 1 puff by mouth twice a day   IBUPROFEN (ADVIL,MOTRIN) 200 MG TABLET    Take 200 mg by mouth every 6 (six) hours as needed for pain. Take 3-4 tablets twice a day for neck pain as needed   LORAZEPAM (ATIVAN) 2 MG TABLET    Take one tablet by mouth every 6 hours as needed for anxiety   NUTRITIONAL SUPPLEMENTS (NUTRITIONAL SUPPLEMENT PO)    Synerflex. Take 2 tablets by mouth daily.   NUTRITIONAL SUPPLEMENTS (NUTRITIONAL SUPPLEMENT PO)    Ginger and Turmeric. Take 1 capsule by mouth daily.  OXYCODONE-ACETAMINOPHEN (PERCOCET/ROXICET) 5-325 MG PER TABLET    Take 1 tablet by mouth every 6 (six) hours as needed.   POTASSIUM 99 MG TABS    Take 1 tablet by mouth daily. Take one tablet once a day for cramps   RAMIPRIL (ALTACE) 5 MG CAPSULE    Take 1 capsule (5 mg total) by mouth daily.   RESVERATROL 100 MG CAPS    Take 1 capsule by mouth daily. Take one tablet once a day   TAMSULOSIN (FLOMAX) 0.4 MG CAPS CAPSULE    Take 1 capsule (0.4 mg total) by mouth daily.  Modified Medications   No medications on file  Discontinued Medications   No medications on file    Review of Systems  Constitutional: Negative for fever, activity change, appetite change, fatigue and unexpected weight change.  HENT: Positive for hearing loss. Negative for ear discharge.   Eyes: Negative.   Respiratory: Negative.  Negative for shortness of breath.   Cardiovascular: Positive for leg swelling. Negative for chest pain.        Left carotid stenosis  Gastrointestinal:       Occasional heartburn which seems well controlled. History of hemorrhoids.  Endocrine: Negative.   Genitourinary:       History of kidney stones. Nocturia. Hesitation Obstruction occurred after surgery 06/14/14. Foley Catheter was removed and he is voiding well. Taking Flomax 2 daily.  Musculoskeletal: Positive for back pain and arthralgias. Negative for neck pain and neck stiffness.       Leg cramps in the past  Skin: Negative.   Neurological:       Episodes of dizziness. None recent. Had an episode of syncope after severe coughing.  Hematological: Negative.   Psychiatric/Behavioral: Positive for sleep disturbance. Self-injury: stop Sinequan. Decreased concentration: healed. Has been released by surgeons.       Mild insomnia.    Filed Vitals:   02/25/15 1356  BP: 122/70  Pulse: 87  Temp: 98.7 F (37.1 C)  TempSrc: Oral  Height: '5\' 11"'  (1.803 m)  Weight: 205 lb 12.8 oz (93.35 kg)  SpO2: 97%   Body mass index is 28.72 kg/(m^2).  Physical Exam  Constitutional: He is oriented to person, place, and time.  Overweight.  HENT:  Mild loss of hearing bilaterally. 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  Eyes:  Corrective lenses.  Neck: No JVD present. No tracheal deviation present. No thyromegaly present.  Healed incision of the left neck from repair of left carotid.  Cardiovascular: Normal rate, regular rhythm, normal heart sounds and intact distal pulses.  Exam reveals no gallop and no friction rub.   No murmur heard. Pulmonary/Chest: Breath sounds normal. No respiratory distress. He has no wheezes. He has no rales. He exhibits no tenderness.  Abdominal: Soft. Bowel sounds are normal. He exhibits no distension and no mass. There is no tenderness.  Musculoskeletal: Normal range of motion. He exhibits no edema or tenderness.  Lymphadenopathy:    He has no cervical adenopathy.  Neurological: He is alert and oriented to  person, place, and time. He has normal reflexes. No cranial nerve deficit. Coordination normal.  Skin: Skin is warm and dry. No rash noted. No erythema. No pallor.  Psychiatric: He has a normal mood and affect. His behavior is normal. Judgment and thought content normal.    Labs reviewed: Lab Summary Latest Ref Rng 02/21/2015 10/25/2014 06/15/2014 06/14/2014  Hemoglobin 13.0 - 17.0 g/dL (None) (None) 11.9(L) 12.6(L)  Hematocrit 39.0 - 52.0 % (  None) (None) 35.4(L) 38.0(L)  White count 4.0 - 10.5 K/uL (None) (None) 7.3 9.8  Platelet count 150 - 400 K/uL (None) (None) 181 172  Sodium 136 - 144 mmol/L 139 141 138 (None)  Potassium 3.5 - 5.2 mmol/L 4.0 4.2 3.6 (None)  Calcium 8.6 - 10.2 mg/dL 8.7 9.0 7.8(L) (None)  Phosphorus - (None) (None) (None) (None)  Creatinine 0.76 - 1.27 mg/dL 1.06 0.93 0.78 0.98  AST 0 - 40 IU/L 19 (None) (None) (None)  Alk Phos 39 - 117 IU/L 51 (None) (None) (None)  Bilirubin 0.0 - 1.2 mg/dL 0.6 (None) (None) (None)  Glucose 65 - 99 mg/dL 91 88 98 (None)  Cholesterol - (None) (None) (None) (None)  HDL cholesterol >39 mg/dL 41 44 (None) (None)  Triglycerides 0 - 149 mg/dL 95 78 (None) (None)  LDL Direct - (None) (None) (None) (None)  LDL Calc 0 - 99 mg/dL 99 105(H) (None) (None)  Total protein - (None) (None) (None) (None)  Albumin 3.5 - 4.7 g/dL 4.1 (None) (None) (None)   Lab Results  Component Value Date   TSH 1.390 10/06/2012   Lab Results  Component Value Date   BUN 11 02/21/2015   No results found for: HGBA1C  Assessment/Plan  1. Essential hypertension - Comprehensive metabolic panel; Future  2. Atherosclerosis of native coronary artery of native heart without angina pectoris -Referral to cardiologist, Dr. Quay Burow - EKG 12-Lead; Future  3. Hyperlipidemia - Lipid panel; Future  4. BPH (benign prostatic hypertrophy) with urinary obstruction Digital rectal exam next visit. Discussed PSA, but he does not want this test.

## 2015-02-25 NOTE — Patient Instructions (Signed)
Reminder- Bring copy of Living Will and [place] to next appointment

## 2015-03-04 ENCOUNTER — Other Ambulatory Visit: Payer: Self-pay | Admitting: Internal Medicine

## 2015-03-05 NOTE — Telephone Encounter (Signed)
It is okay to put Sinequan back on his medication list and renew as needed

## 2015-03-05 NOTE — Telephone Encounter (Signed)
Spoke with patient, patient states he is still taking medication. Patient is trying to d/c medication. Please advise if ok to add medication back to med list

## 2015-03-24 ENCOUNTER — Other Ambulatory Visit: Payer: Self-pay | Admitting: Internal Medicine

## 2015-04-08 ENCOUNTER — Ambulatory Visit (INDEPENDENT_AMBULATORY_CARE_PROVIDER_SITE_OTHER): Payer: Commercial Managed Care - HMO | Admitting: Cardiovascular Disease

## 2015-04-08 ENCOUNTER — Encounter: Payer: Self-pay | Admitting: Cardiovascular Disease

## 2015-04-08 VITALS — BP 158/78 | HR 78 | Ht 71.0 in | Wt 210.0 lb

## 2015-04-08 DIAGNOSIS — I779 Disorder of arteries and arterioles, unspecified: Secondary | ICD-10-CM

## 2015-04-08 DIAGNOSIS — E785 Hyperlipidemia, unspecified: Secondary | ICD-10-CM | POA: Diagnosis not present

## 2015-04-08 DIAGNOSIS — I739 Peripheral vascular disease, unspecified: Principal | ICD-10-CM

## 2015-04-08 MED ORDER — ATORVASTATIN CALCIUM 20 MG PO TABS: ORAL_TABLET | ORAL | Status: DC

## 2015-04-08 NOTE — Assessment & Plan Note (Signed)
History of coronary artery disease status post bypass grafting in 2000 by Dr. Merilynn Finland. He apparently had a stress test done 2 years ago by Roque Cash [facility] in [facility]. He denies chest pain or shortness of breath.

## 2015-04-08 NOTE — Assessment & Plan Note (Signed)
History of hyperlipidemia or atorvastatin 10 mg a day with recent lipid profile performed 02/21/15 revealing an LDL of 99 and HDL of 41. I'm going to increase his Lipitor to 20 mg a day and will recheck lab work in 2 months

## 2015-04-08 NOTE — Progress Notes (Signed)
04/08/2015 Alex [name]   30-Aug-1931  [id number]  Primary Physician GREEN, Viviann Spare, MD Primary Cardiologist: Alex Harp MD Alex [name]   HPI:  Alex [name] is a delightful 81 year old widowed Caucasian male father of 4, grandfather to Alex [name] he was referred by Alex [name] [name] at [place] for evaluation of carotid artery disease. I last saw him in the office 05/15/14. He has a history of coronary artery bypass grafting x4 in 2000 performed by Dr. Merilynn [name]. His other problems include remote tobacco abuse having quit in 1975. He does have a history of hypertension and hyperlipidemia currently not being treated. He's not had a stroke or a myocardial infarction. He had carotid Dopplers performed at [name] on 10/23/2012 that showed moderate left ICA stenosis. Follow-up carotid Dopplers performed office suggested  marked and rapid progression of disease now with what appears to be a high-grade 90+ percent stenosis at the origin of his left internal carotid artery. He underwent elective left carotid endarterectomy by Alex [name] 06/14/14. Dopplers performed 12/30/14 revealing a widely patent endarterectomy site.  Current Outpatient Prescriptions  Medication Sig Dispense Refill  . aspirin 81 MG tablet Take 81 mg by mouth daily. Take one tablet once a day    . atorvastatin (LIPITOR) 20 MG tablet One daily to lower cholesterol 90 tablet 3  . b complex vitamins tablet Take 1 tablet by mouth daily.    . cholecalciferol (VITAMIN D) 1000 UNITS tablet Take 1,000 Units by mouth daily.     . Cinnamon 500 MG TABS Take 1 tablet by mouth daily.    Marland Kitchen CITRUS BERGAMOT PO Take 1 capsule by mouth daily. Take one capsule once daily to lower cholesterol    . Coenzyme Q10 (CO Q 10) 100 MG CAPS Take 1 capsule by mouth daily.    . diazepam (VALIUM) Alex MG tablet take 1/2 tablet by mouth UP TO twice a day if needed 30 tablet 0  . doxepin (SINEQUAN) 25 MG capsule take 1 capsule by  mouth at bedtime 30 capsule 3  . fish oil-omega-3 fatty acids 1000 MG capsule Take 2 g by mouth daily. Take one tablet once a day for cholesterol    . Fluticasone-Salmeterol (ADVAIR DISKUS) 250-50 MCG/DOSE AEPB inhale 1 puff by mouth twice a day 60 each Alex  . ibuprofen (ADVIL,MOTRIN) 200 MG tablet Take 200 mg by mouth every 6 (six) hours as needed for pain. Take 3-4 tablets twice a day for neck pain as needed    . LORazepam (ATIVAN) 2 MG tablet Take one tablet by mouth every 6 hours as needed for anxiety 120 tablet 0  . Nutritional Supplements (NUTRITIONAL SUPPLEMENT PO) Synerflex. Take 2 tablets by mouth daily.    . Nutritional Supplements (NUTRITIONAL SUPPLEMENT PO) Ginger and Turmeric. Take 1 capsule by mouth daily.    Marland Kitchen oxyCODONE-acetaminophen (PERCOCET/ROXICET) Alex-325 MG per tablet Take 1 tablet by mouth every 6 (six) hours as needed. 30 tablet 0  . Potassium 99 MG TABS Take 1 tablet by mouth daily. Take one tablet once a day for cramps    . ramipril (ALTACE) Alex MG capsule Take 1 capsule (Alex mg total) by mouth daily. 90 capsule 3  . RESVERATROL 100 MG CAPS Take 1 capsule by mouth daily. Take one tablet once a day    . tamsulosin (FLOMAX) 0.4 MG CAPS capsule Take 1 capsule (0.4 mg total) by mouth daily. (Patient taking differently: Take 0.4 mg by mouth 2 (two)  times daily. ) 30 capsule 1   No current facility-administered medications for this visit.    No Known Allergies  Social History   Social History  . Marital Status: Widowed    Spouse Name: N/A  . Number of Children: N/A  . Years of Education: N/A   Occupational History  . Not on file.   Social History Main Topics  . Smoking status: Former Smoker -- 1.50 packs/day for 22 years    Types: Cigarettes    Quit date: 05/03/1973  . Smokeless tobacco: Never Used  . Alcohol Use: 3.6 oz/week    3 Glasses of wine, 3 Cans of beer per week  . Drug Use: No  . Sexual Activity: No   Other Topics Concern  . Not on file   Social History  Narrative     Review of Systems: General: negative for chills, fever, night sweats or weight changes.  Cardiovascular: negative for chest pain, dyspnea on exertion, edema, orthopnea, palpitations, paroxysmal nocturnal dyspnea or shortness of breath Dermatological: negative for rash Respiratory: negative for cough or wheezing Urologic: negative for hematuria Abdominal: negative for nausea, vomiting, diarrhea, bright red blood per rectum, melena, or hematemesis Neurologic: negative for visual changes, syncope, or dizziness All other systems reviewed and are otherwise negative except as noted above.    Blood pressure 158/78, pulse 78, height Alex\' 11"  (1.803 m), weight 210 lb (95.255 kg).  General appearance: alert and no distress Neck: no adenopathy, no carotid bruit, no JVD, supple, symmetrical, trachea midline and thyroid not enlarged, symmetric, no tenderness/mass/nodules Lungs: clear to auscultation bilaterally Heart: regular rate and rhythm, S1, S2 normal, no murmur, click, rub or gallop Extremities: extremities normal, atraumatic, no cyanosis or edema  EKG not performed today  ASSESSMENT AND PLAN:   Hyperlipidemia History of hyperlipidemia or atorvastatin 10 mg a day with recent lipid profile performed 02/21/15 revealing an LDL of 99 and HDL of 41. I'm going to increase his Lipitor to 20 mg a day and will recheck lab work in 2 months  Essential hypertension History of hypertension blood pressure measured today at 150/78. He is on ramipril. Continue current meds at current dosing  Coronary atherosclerosis History of coronary artery disease status post bypass grafting in 2000 by Dr. Merilynn [name]. He apparently had a stress test done 2 years ago by Alex [name] in [name]. He denies chest pain or shortness of breath.  Carotid artery disease History of carotid artery disease status post left carotid endarterectomy performed by Alex [name]  06/14/14 at my request. Follow-up  Dopplers performed in his office A/29/16 revealed the site to be widely patent.      Alex Harp MD FACP,FACC,FAHA, [facility] 04/08/2015 4:15 PM

## 2015-04-08 NOTE — Assessment & Plan Note (Signed)
History of hypertension blood pressure measured today at 150/78. He is on ramipril. Continue current meds at current dosing

## 2015-04-08 NOTE — Assessment & Plan Note (Signed)
History of carotid artery disease status post left carotid endarterectomy performed by Dr. Trula Slade  06/14/14 at my request. Follow-up Dopplers performed in his office A/29/16 revealed the site to be widely patent.

## 2015-04-08 NOTE — Patient Instructions (Signed)
Medication Instructions:  Your physician has recommended you make the following change in your medication:  1) INCREASE Lipitor to 20 mg by mouth ONCE daily   Labwork: Your physician recommends that you return for lab work in: FASTING (Lipid/Liver) The lab can be found on the FIRST FLOOR of out building in [address]   Testing/Procedures: Your physician has requested that you have a carotid duplex. This test is an ultrasound of the carotid arteries in your neck. It looks at blood flow through these arteries that supply the brain with blood. Allow one hour for this exam. There are no restrictions or special instructions. August 2017    Follow-Up: Your physician wants you to follow-up in: 12 months with Dr. Gwenlyn Found. You will receive a reminder letter in the mail two months in advance. If you don't receive a letter, please call our office to schedule the follow-up appointment.   Any Other Special Instructions Will Be Listed Below (If Applicable).     If you need a refill on your cardiac medications before your next appointment, please call your pharmacy.

## 2015-05-07 ENCOUNTER — Other Ambulatory Visit: Payer: Self-pay | Admitting: *Deleted

## 2015-05-07 MED ORDER — LORAZEPAM 2 MG PO TABS: ORAL_TABLET | ORAL | Status: DC

## 2015-05-07 NOTE — Telephone Encounter (Signed)
[facility] [place] 

## 2015-05-09 ENCOUNTER — Other Ambulatory Visit: Payer: Self-pay | Admitting: *Deleted

## 2015-05-09 MED ORDER — DIAZEPAM 5 MG PO TABS: ORAL_TABLET | ORAL | Status: DC

## 2015-05-09 NOTE — Telephone Encounter (Signed)
[facility] [place] 

## 2015-07-01 ENCOUNTER — Encounter: Payer: Self-pay | Admitting: Family

## 2015-07-07 ENCOUNTER — Ambulatory Visit: Payer: Self-pay | Admitting: Family

## 2015-07-07 ENCOUNTER — Ambulatory Visit (HOSPITAL_COMMUNITY)
Admission: RE | Admit: 2015-07-07 | Discharge: 2015-07-07 | Disposition: A | Discharge status: Home / Self Care | Payer: Commercial Managed Care - HMO | Source: Ambulatory Visit | Attending: Cardiovascular Disease | Admitting: Cardiovascular Disease

## 2015-07-07 ENCOUNTER — Encounter: Payer: Self-pay | Admitting: Family

## 2015-07-07 ENCOUNTER — Ambulatory Visit (INDEPENDENT_AMBULATORY_CARE_PROVIDER_SITE_OTHER): Payer: Commercial Managed Care - HMO | Admitting: Family

## 2015-07-07 ENCOUNTER — Encounter (HOSPITAL_COMMUNITY): Payer: Self-pay

## 2015-07-07 ENCOUNTER — Other Ambulatory Visit: Payer: Self-pay | Admitting: Surgery

## 2015-07-07 VITALS — BP 141/84 | HR 83 | Ht 71.0 in | Wt 208.1 lb

## 2015-07-07 DIAGNOSIS — I6523 Occlusion and stenosis of bilateral carotid arteries: Secondary | ICD-10-CM

## 2015-07-07 DIAGNOSIS — Z48812 Encounter for surgical aftercare following surgery on the circulatory system: Secondary | ICD-10-CM | POA: Diagnosis not present

## 2015-07-07 DIAGNOSIS — Z9889 Other specified postprocedural states: Secondary | ICD-10-CM

## 2015-07-07 DIAGNOSIS — I6521 Occlusion and stenosis of right carotid artery: Secondary | ICD-10-CM | POA: Diagnosis not present

## 2015-07-07 DIAGNOSIS — I509 Heart failure, unspecified: Secondary | ICD-10-CM | POA: Diagnosis not present

## 2015-07-07 DIAGNOSIS — I110 Hypertensive heart disease with heart failure: Secondary | ICD-10-CM | POA: Insufficient documentation

## 2015-07-07 DIAGNOSIS — E785 Hyperlipidemia, unspecified: Secondary | ICD-10-CM | POA: Diagnosis not present

## 2015-07-07 NOTE — Progress Notes (Signed)
Chief Complaint: Extracranial Carotid Artery Stenosis   History of Present Illness  Alex [name] is a 82 y.o. male patient of Dr. Trula Slade who is s/p left carotid endarterectomy with bovine pericardial patch angioplasty on 06/14/2014. Intraoperative findings included 85% stenosis. The lesion was approximately 2.5 cm above the carotid bifurcation. The patient had a very stiff neck making exposure difficult therefore a shunt was not placed.  Postoperatively the patient had issues with urinary retention and he was discharged to home with a catheter. He remains neurologically intact other than a marginal mandibular neurapraxia.  The patient denies any history of TIA or stroke symptoms, specifically the patient denies a history of amaurosis fugax or monocular blindness, denies a history unilateral of facial drooping, denies a history of hemiplegia, and denies a history of receptive or expressive aphasia.   The patient denies New Medical or Surgical History.  Pt Diabetic: no Pt smoker: former smoker, quit in the 1980's  Pt meds include: Statin : pt indicates that he takes his statin "every once in a while"; I advised pt to let his PCP know this ASA: no, he stopped taking, states he did not let his PCP know, states he was bruising easily Other anticoagulants/antiplatelets: no    Past Medical History  Diagnosis Date  . Urinary frequency   . 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 Cellulitis and abscess of unspecified site   . Obesity, unspecified   . Unspecified vitamin D deficiency   . Palpitations   . Hypertrophy of prostate with urinary obstruction and other lower urinary tract symptoms (LUTS)   . Insomnia, unspecified   . Acquired deformity of nose   . Cervicalgia   . Dizziness and giddiness   . Other premature beats   . Special screening for malignant neoplasm of prostate    . Slowing of urinary stream   . Shortness of breath   . Encounter for long-term (current) use of other medications   . Esophagitis, unspecified   . Other and unspecified hyperlipidemia   . Calculus of kidney   . Unspecified essential hypertension   . Old myocardial infarction   . Coronary atherosclerosis of unspecified type of vessel, native or graft   . Internal hemorrhoids without mention of complication   . Carotid artery disease ([place])   . Hyperlipidemia   . CHF (congestive heart failure) ([place] [facility] [place])     Social History Social History  Substance Use Topics  . Smoking status: Former Smoker -- 1.50 packs/day for 22 years    Types: Cigarettes    Quit date: 05/03/1973  . Smokeless tobacco: Never Used  . Alcohol Use: 3.6 oz/week    3 Glasses of wine, 3 Cans of beer per week    Family History Family History  Problem Relation Age of Onset  . Cancer Mother     LIVER  . Cancer Brother     PROSTATE  . Cancer Daughter     Surgical History Past Surgical History  Procedure Laterality Date  . Tonsillectomy  1940  . Excisional hemorrhoidectomy  1981  . Cabg x 4  2000  . Lesion from forehead,(r) ear (l) arm      DR Almira Coaster  . Lithotripsy  2005    Dr Almira Coaster  . Esophagogastroduodenoscopy endoscopy  2007    Dr Lajoyce Corners, with biopsy  . Colonoscopy  2007    Dr Lajoyce Corners,  normal  . Cardiac catheterization    . Coronary artery bypass graft      2000  . Endarterectomy Left 06/14/2014    Procedure: LEFT CAROTID ENDARTERECTOMY WITH VASCUGUARD PATCH ANGIOPLASTY;  Surgeon: Serafina Mitchell, MD;  Location: [facility] OR;  Service: Vascular;  Laterality: Left;  . Carotid endarterectomy Left 06-14-14    CE    No Known Allergies  Current Outpatient Prescriptions  Medication Sig Dispense Refill  . aspirin 81 MG tablet Take 81 mg by mouth daily. Take one tablet once a day    . atorvastatin (LIPITOR) 20 MG tablet One daily to lower cholesterol 90 tablet 3  . b complex vitamins tablet Take 1 tablet by  mouth daily.    . cholecalciferol (VITAMIN D) 1000 UNITS tablet Take 1,000 Units by mouth daily.     . Cinnamon 500 MG TABS Take 1 tablet by mouth daily.    Marland Kitchen CITRUS BERGAMOT PO Take 1 capsule by mouth daily. Take one capsule once daily to lower cholesterol    . Coenzyme Q10 (CO Q 10) 100 MG CAPS Take 1 capsule by mouth daily.    . diazepam (VALIUM) 5 MG tablet Take 1/2 tablet by mouth up to twice daily as needed for anxiety 30 tablet 0  . doxepin (SINEQUAN) 25 MG capsule take 1 capsule by mouth at bedtime 30 capsule 3  . fish oil-omega-3 fatty acids 1000 MG capsule Take 2 g by mouth daily. Take one tablet once a day for cholesterol    . Fluticasone-Salmeterol (ADVAIR DISKUS) 250-50 MCG/DOSE AEPB inhale 1 puff by mouth twice a day 60 each 5  . ibuprofen (ADVIL,MOTRIN) 200 MG tablet Take 200 mg by mouth every 6 (six) hours as needed for pain. Take 3-4 tablets twice a day for neck pain as needed    . LORazepam (ATIVAN) 2 MG tablet Take one tablet by mouth every 6 hours as needed for anxiety 120 tablet 0  . Nutritional Supplements (NUTRITIONAL SUPPLEMENT PO) Synerflex. Take 2 tablets by mouth daily.    . Nutritional Supplements (NUTRITIONAL SUPPLEMENT PO) Ginger and Turmeric. Take 1 capsule by mouth daily.    Marland Kitchen oxyCODONE-acetaminophen (PERCOCET/ROXICET) 5-325 MG per tablet Take 1 tablet by mouth every 6 (six) hours as needed. 30 tablet 0  . Potassium 99 MG TABS Take 1 tablet by mouth daily. Take one tablet once a day for cramps    . ramipril (ALTACE) 5 MG capsule Take 1 capsule (5 mg total) by mouth daily. 90 capsule 3  . RESVERATROL 100 MG CAPS Take 1 capsule by mouth daily. Take one tablet once a day    . tamsulosin (FLOMAX) 0.4 MG CAPS capsule Take 1 capsule (0.4 mg total) by mouth daily. (Patient taking differently: Take 0.4 mg by mouth 2 (two) times daily. ) 30 capsule 1   No current facility-administered medications for this visit.    Review of Systems : See HPI for pertinent positives and  negatives.  Physical Examination  Filed Vitals:   07/07/15 1536 07/07/15 1538  BP: 154/83 141/84  Pulse: 83   Height: 5\' 11"  (1.803 m)   Weight: 208 lb 1.6 oz (94.394 kg)   SpO2: 94%    Body mass index is 29.04 kg/(m^2).  General: WDWN male in NAD GAIT: normal Eyes: PERRLA Pulmonary: Non-labored, CTAB  Cardiac: regular rhythm, no detected murmur.  VASCULAR EXAM Carotid Bruits Right Left   Negative Negative   Aorta is not palpable. Radial pulses are 2+ palpable and  equal.      LE Pulses Right Left   POPLITEAL not palpable  not palpable   POSTERIOR TIBIAL  palpable   palpable    DORSALIS PEDIS  ANTERIOR TIBIAL palpable  palpable     Gastrointestinal: soft, nontender, BS WNL, no r/g, no palpable masses.  Musculoskeletal: no muscle atrophy/wasting. M/S 5/5 throughout, extremities without ischemic changes.  Neurologic: A&O X 3; Appropriate Affect, Speech is normal CN 2-12 intact except mild deviation to the right of tongue, pain and light touch intact in extremities, Motor exam as listed above.          Non-Invasive Vascular Imaging CAROTID DUPLEX 07/07/2015   Right ICA: <40% stenosis. Left ICA: widely patent CEA site with no evidence of restenosis. No significant change compared to prior exam   Assessment: Alex [name] is a 82 y.o. male who is s/p left carotid endarterectomy with bovine pericardial patch angioplasty on 06/14/2014. He has no history of stroke or TIA. Today's carotid duplex suggests <40% right ICA stenosis and widely patent left ICA (CEA site) with no evidence of restenosis. No significant change compared to prior exam.  Since pt denies any adverse reaction to taking a daily 81 mg ASA, denies GI bleeding hx, I again advised him to take a daily 81 mg ASA which evidence  has proven to reduce the risk of a CVD event.   Plan: Follow-up in 1 year with Carotid Duplex scan.   I discussed in depth with the patient the nature of atherosclerosis, and emphasized the importance of maximal medical management including strict control of blood pressure, blood glucose, and lipid levels, obtaining regular exercise, and continued cessation of smoking.  The patient is aware that without maximal medical management the underlying atherosclerotic disease process will progress, limiting the benefit of any interventions. The patient was given information about stroke prevention and what symptoms should prompt the patient to seek immediate medical care. Thank you for allowing [place] to participate in this patient's care.  Clemon Chambers, RN, MSN, FNP-C [facility] Office: [facility] Physician: Trula Slade  07/07/2015 4:04 PM

## 2015-07-07 NOTE — Patient Instructions (Signed)
Stroke Prevention Some medical conditions and behaviors are associated with an increased chance of having a stroke. You may prevent a stroke by making healthy choices and managing medical conditions. HOW CAN I REDUCE MY RISK OF HAVING A STROKE?   Stay physically active. Get at least 30 minutes of activity on most or all days.  Do not smoke. It may also be helpful to avoid exposure to secondhand smoke.  Limit alcohol use. Moderate alcohol use is considered to be:  No more than 2 drinks per day for men.  No more than 1 drink per day for nonpregnant women.  Eat healthy foods. This involves:  Eating 5 or more servings of fruits and vegetables a day.  Making dietary changes that address high blood pressure (hypertension), high cholesterol, diabetes, or obesity.  Manage your cholesterol levels.  Making food choices that are high in fiber and low in saturated fat, trans fat, and cholesterol may control cholesterol levels.  Take any prescribed medicines to control cholesterol as directed by your health care provider.  Manage your diabetes.  Controlling your carbohydrate and sugar intake is recommended to manage diabetes.  Take any prescribed medicines to control diabetes as directed by your health care provider.  Control your hypertension.  Making food choices that are low in salt (sodium), saturated fat, trans fat, and cholesterol is recommended to manage hypertension.  Ask your health care provider if you need treatment to lower your blood pressure. Take any prescribed medicines to control hypertension as directed by your health care provider.  If you are 18-39 years of age, have your blood pressure checked every 3-5 years. If you are 40 years of age or older, have your blood pressure checked every year.  Maintain a healthy weight.  Reducing calorie intake and making food choices that are low in sodium, saturated fat, trans fat, and cholesterol are recommended to manage  weight.  Stop drug abuse.  Avoid taking birth control pills.  Talk to your health care provider about the risks of taking birth control pills if you are over 35 years old, smoke, get migraines, or have ever had a blood clot.  Get evaluated for sleep disorders (sleep apnea).  Talk to your health care provider about getting a sleep evaluation if you snore a lot or have excessive sleepiness.  Take medicines only as directed by your health care provider.  For some people, aspirin or blood thinners (anticoagulants) are helpful in reducing the risk of forming abnormal blood clots that can lead to stroke. If you have the irregular heart rhythm of atrial fibrillation, you should be on a blood thinner unless there is a good reason you cannot take them.  Understand all your medicine instructions.  Make sure that other conditions (such as anemia or atherosclerosis) are addressed. SEEK IMMEDIATE MEDICAL CARE IF:   You have sudden weakness or numbness of the face, arm, or leg, especially on one side of the body.  Your face or eyelid droops to one side.  You have sudden confusion.  You have trouble speaking (aphasia) or understanding.  You have sudden trouble seeing in one or both eyes.  You have sudden trouble walking.  You have dizziness.  You have a loss of balance or coordination.  You have a sudden, severe headache with no known cause.  You have new chest pain or an irregular heartbeat. Any of these symptoms may represent a serious problem that is an emergency. Do not wait to see if the symptoms will   go away. Get medical help at once. Call your local emergency services (911 in [place] Do not drive yourself to the hospital.   This information is not intended to replace advice given to you by your health care provider. Make sure you discuss any questions you have with your health care provider.   Document Released: 05/27/2004 Document Revised: 05/10/2014 Document Reviewed:  10/20/2012 Elsevier Interactive Patient Education 2016 [employer]vier Inc.  

## 2015-07-10 ENCOUNTER — Other Ambulatory Visit: Payer: Self-pay

## 2015-07-10 MED ORDER — DIAZEPAM 5 MG PO TABS: ORAL_TABLET | ORAL | Status: DC

## 2015-07-10 MED ORDER — DOXEPIN HCL 25 MG PO CAPS: 25.0000 mg | ORAL_CAPSULE | Freq: Every day | ORAL | Status: DC

## 2015-07-10 NOTE — Addendum Note (Signed)
Addended by: Logan Bores on: 07/10/2015 11:20 AM   Modules accepted: Orders

## 2015-08-25 ENCOUNTER — Other Ambulatory Visit: Payer: Commercial Managed Care - HMO

## 2015-08-25 DIAGNOSIS — I10 Essential (primary) hypertension: Secondary | ICD-10-CM

## 2015-08-25 DIAGNOSIS — E785 Hyperlipidemia, unspecified: Secondary | ICD-10-CM

## 2015-08-26 LAB — COMPREHENSIVE METABOLIC PANEL
ALBUMIN: 4 g/dL (ref 3.5–4.7)
ALK PHOS: 52 IU/L (ref 39–117)
ALT: 12 IU/L (ref 0–44)
AST: 16 IU/L (ref 0–40)
Albumin/Globulin Ratio: 1.4 (ref 1.2–2.2)
BUN / CREAT RATIO: 10 (ref 10–24)
BUN: 10 mg/dL (ref 8–27)
Bilirubin Total: 0.5 mg/dL (ref 0.0–1.2)
CALCIUM: 8.8 mg/dL (ref 8.6–10.2)
CO2: 24 mmol/L (ref 18–29)
CREATININE: 1.01 mg/dL (ref 0.76–1.27)
Chloride: 100 mmol/L (ref 96–106)
GFR, EST AFRICAN AMERICAN: 80 mL/min/{1.73_m2} (ref >59)
GFR, EST NON AFRICAN AMERICAN: 69 mL/min/{1.73_m2} (ref >59)
GLOBULIN, TOTAL: 2.8 g/dL (ref 1.5–4.5)
GLUCOSE: 91 mg/dL (ref 65–99)
Potassium: 4.2 mmol/L (ref 3.5–5.2)
Sodium: 139 mmol/L (ref 134–144)
TOTAL PROTEIN: 6.8 g/dL (ref 6.0–8.5)

## 2015-08-26 LAB — LIPID PANEL
CHOL/HDL RATIO: 4.5 ratio (ref 0.0–5.0)
CHOLESTEROL TOTAL: 193 mg/dL (ref 100–199)
HDL: 43 mg/dL (ref >39)
LDL CALC: 129 mg/dL — AB (ref 0–99)
Triglycerides: 103 mg/dL (ref 0–149)
VLDL CHOLESTEROL CAL: 21 mg/dL (ref 5–40)

## 2015-08-27 ENCOUNTER — Encounter: Payer: Self-pay | Admitting: Internal Medicine

## 2015-08-27 ENCOUNTER — Ambulatory Visit (INDEPENDENT_AMBULATORY_CARE_PROVIDER_SITE_OTHER): Payer: Commercial Managed Care - HMO | Admitting: Internal Medicine

## 2015-08-27 VITALS — BP 148/92 | HR 80 | Temp 97.9°F | Ht 71.0 in | Wt 208.0 lb

## 2015-08-27 DIAGNOSIS — N401 Enlarged prostate with lower urinary tract symptoms: Secondary | ICD-10-CM | POA: Diagnosis not present

## 2015-08-27 DIAGNOSIS — R609 Edema, unspecified: Secondary | ICD-10-CM | POA: Diagnosis not present

## 2015-08-27 DIAGNOSIS — I10 Essential (primary) hypertension: Secondary | ICD-10-CM | POA: Diagnosis not present

## 2015-08-27 DIAGNOSIS — R42 Dizziness and giddiness: Secondary | ICD-10-CM

## 2015-08-27 DIAGNOSIS — G4700 Insomnia, unspecified: Secondary | ICD-10-CM

## 2015-08-27 DIAGNOSIS — E669 Obesity, unspecified: Secondary | ICD-10-CM | POA: Diagnosis not present

## 2015-08-27 DIAGNOSIS — I2510 Atherosclerotic heart disease of native coronary artery without angina pectoris: Secondary | ICD-10-CM

## 2015-08-27 DIAGNOSIS — N138 Other obstructive and reflux uropathy: Secondary | ICD-10-CM

## 2015-08-27 DIAGNOSIS — E785 Hyperlipidemia, unspecified: Secondary | ICD-10-CM | POA: Diagnosis not present

## 2015-08-27 MED ORDER — DOXEPIN HCL 75 MG PO CAPS: ORAL_CAPSULE | ORAL | Status: DC

## 2015-08-27 NOTE — Progress Notes (Signed)
Patient ID: Alex [name], male   DOB: May 16, 1931, 82 y.o.   MRN: [id number] MMSE 30/30 failed clock

## 2015-08-27 NOTE — Progress Notes (Signed)
Patient ID: Alex [name], male   DOB: June 07, 1931, 82 y.o.   MRN: [id number]    HISTORY AND PHYSICAL  Location:    [place]   Place of Service:   OFFICE  Extended Emergency Contact Information Primary Emergency Contact: Keown,Jack Address: [address]          [place], [place] [address] [address] [place]itter of [place] Phone: [phone] Relation: Son Secondary Emergency Contact: Windell Moment Address: [address]          [place] [address] [place] Litter of [place] Phone: [phone] Relation: Son  Advanced Directive information Does patient have an advance directive?: Yes, Type of Advance Directive: Living will  Chief Complaint  Patient presents with  . Annual Exam    Wellness exam  . Medical Management of Chronic Issues    blood pressure, CAD, cholesterol  . Sleeping Problem    staying asleep  . MMSE    30/30 failed clock drawing    HPI:  Essential hypertension - running high.  Hyperlipidemia - LDL 129  Insomnia - worse  Obesity - down 2# since Dec 2016.  Edema, unspecified type - trace bipedal  Atherosclerosis of native coronary artery of native heart without angina pectoris - asymptomatic  Dizzy spells resolve with the use of diazepam  Past Medical History  Diagnosis Date  . Urinary frequency   . 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 Cellulitis and abscess of unspecified site   . Obesity, unspecified   . Unspecified vitamin D deficiency   . Palpitations   . Hypertrophy of prostate with urinary obstruction and other lower urinary tract symptoms (LUTS)   . Insomnia, unspecified   . Acquired deformity of nose   . Cervicalgia   . Dizziness and giddiness   . Other premature beats   . Special screening for malignant neoplasm of prostate   . Slowing of urinary stream   . Shortness of breath   . Encounter for long-term (current) use of other  medications   . Esophagitis, unspecified   . Other and unspecified hyperlipidemia   . Calculus of kidney   . Unspecified essential hypertension   . Old myocardial infarction   . Coronary atherosclerosis of unspecified type of vessel, native or graft   . Internal hemorrhoids without mention of complication   . Carotid artery disease ([place])   . Hyperlipidemia   . CHF (congestive heart failure) [facility])     Past Surgical History  Procedure Laterality Date  . Tonsillectomy  1940  . Excisional hemorrhoidectomy  1981  . Cabg x 4  2000  . Lesion from forehead,(r) ear (l) arm      DR Almira Coaster  . Lithotripsy  2005    Dr Almira Coaster  . Esophagogastroduodenoscopy endoscopy  2007    Dr Lajoyce Corners, with biopsy  . Colonoscopy  2007    Dr Lajoyce Corners, normal  . Cardiac catheterization    . Coronary artery bypass graft      2000  . Endarterectomy Left 06/14/2014    Procedure: LEFT CAROTID ENDARTERECTOMY WITH VASCUGUARD PATCH ANGIOPLASTY;  Surgeon: Serafina Mitchell, MD;  Location: [facility];  Service: Vascular;  Laterality: Left;  . Carotid endarterectomy Left 06-14-14    CE    Patient Care Team: Estill Dooms, MD as PCP - General (Internal Medicine) Lorretta Harp, MD as Consulting Physician (Cardiology) Butch Penny  Trula Slade, MD as Consulting Physician (Vascular Surgery) Shon Hough, MD as Consulting Physician (Ophthalmology) Bjorn Loser, MD as Consulting Physician (Urology) Shon Hough, MD as Consulting Physician (Ophthalmology)  Social History   Social History  . Marital Status: Widowed    Spouse Name: N/A  . Number of Children: N/A  . Years of Education: N/A   Occupational History  . Not on file.   Social History Main Topics  . Smoking status: Former Smoker -- 1.50 packs/day for 22 years    Types: Cigarettes    Quit date: 05/03/1973  . Smokeless tobacco: Never Used  . Alcohol Use: 3.6 oz/week    3 Glasses of wine, 3 Cans of beer per week  . Drug Use: No  . Sexual Activity: No     Other Topics Concern  . Not on file   Social History Narrative    reports that he quit smoking about 42 years ago. His smoking use included Cigarettes. He has a 33 pack-year smoking history. He has never used smokeless tobacco. He reports that he drinks about 3.6 oz of alcohol per week. He reports that he does not use illicit drugs.  Family History  Problem Relation Age of Onset  . Cancer Mother     LIVER  . Cancer Brother     PROSTATE  . Cancer Daughter    Family Status  Relation Status Death Age  . Mother Deceased   . Father Deceased   . Brother Alive   . Daughter Deceased   . Son Alive   . Son Alive   . Daughter Alive     Immunization History  Administered Date(s) Administered  . Influenza Whole 02/06/2013  . Influenza-Unspecified 01/31/2009, 01/08/2014, 12/13/2014  . Pneumococcal Conjugate-13 11/08/2013  . Pneumococcal-Unspecified 05/03/2004  . Td 05/03/2004  . Zoster 07/19/2012    No Known Allergies  Medications: Patient's Medications  New Prescriptions   No medications on file  Previous Medications   ASPIRIN 81 MG TABLET    Take 81 mg by mouth daily. Take one tablet once a day   ATORVASTATIN (LIPITOR) 20 MG TABLET    One daily to lower cholesterol   B COMPLEX VITAMINS TABLET    Take 1 tablet by mouth daily.   CHOLECALCIFEROL (VITAMIN D) 1000 UNITS TABLET    Take 1,000 Units by mouth daily.    CINNAMON 500 MG TABS    Take 1 tablet by mouth daily.   CITRUS BERGAMOT PO    Take 1 capsule by mouth daily. Take one capsule once daily to lower cholesterol   COENZYME Q10 (CO Q 10) 100 MG CAPS    Take 1 capsule by mouth daily.   DIAZEPAM (VALIUM) 5 MG TABLET    Take 1/2 tablet by mouth up to twice daily as needed for anxiety   DOXEPIN (SINEQUAN) 25 MG CAPSULE    Take 1 capsule (25 mg total) by mouth at bedtime.   FISH OIL-OMEGA-3 FATTY ACIDS 1000 MG CAPSULE    Take 2 g by mouth daily. Take one tablet once a day for cholesterol   FLUTICASONE-SALMETEROL (ADVAIR  DISKUS) 250-50 MCG/DOSE AEPB    inhale 1 puff by mouth twice a day   IBUPROFEN (ADVIL,MOTRIN) 200 MG TABLET    Take 200 mg by mouth every 6 (six) hours as needed for pain. Take 3-4 tablets twice a day for neck pain as needed   LORAZEPAM (ATIVAN) 2 MG TABLET    Take one tablet by mouth every 6 hours  as needed for anxiety   NUTRITIONAL SUPPLEMENTS (NUTRITIONAL SUPPLEMENT PO)    Synerflex. Take 2 tablets by mouth daily.   NUTRITIONAL SUPPLEMENTS (NUTRITIONAL SUPPLEMENT PO)    Ginger and Turmeric. Take 1 capsule by mouth daily.   OXYCODONE-ACETAMINOPHEN (PERCOCET/ROXICET) 5-325 MG PER TABLET    Take 1 tablet by mouth every 6 (six) hours as needed.   POTASSIUM 99 MG TABS    Take 1 tablet by mouth daily. Take one tablet once a day for cramps   RAMIPRIL (ALTACE) 5 MG CAPSULE    Take 1 capsule (5 mg total) by mouth daily.   RESVERATROL 100 MG CAPS    Take 1 capsule by mouth daily. Take one tablet once a day   TAMSULOSIN (FLOMAX) 0.4 MG CAPS CAPSULE    Take 1 capsule (0.4 mg total) by mouth daily.  Modified Medications   No medications on file  Discontinued Medications   No medications on file    Review of Systems  Constitutional: Negative for fever, activity change, appetite change, fatigue and unexpected weight change.  HENT: Positive for hearing loss. Negative for ear discharge.   Eyes: Negative.   Respiratory: Negative.  Negative for shortness of breath.   Cardiovascular: Positive for leg swelling. Negative for chest pain.       Left carotid stenosis  Gastrointestinal:       Occasional heartburn which seems well controlled. History of hemorrhoids.  Endocrine: Negative.   Genitourinary:       History of kidney stones. Nocturia. Hesitation Obstruction occurred after surgery 06/14/14. Foley Catheter was removed and he is voiding well. Taking Flomax 2 daily.  Musculoskeletal: Positive for back pain and arthralgias. Negative for neck pain and neck stiffness.       Leg cramps in the past  Skin:  Negative.   Neurological:       Episodes of dizziness. None recent. Had an episode of syncope after severe coughing.  Hematological: Negative.   Psychiatric/Behavioral: Positive for sleep disturbance. Self-injury: stop Sinequan. Decreased concentration: healed. Has been released by surgeons.       Mild insomnia.    Filed Vitals:   08/27/15 1427  BP: 148/92  Pulse: 80  Temp: 97.9 F (36.6 C)  TempSrc: Oral  Height: '5\' 11"'  (1.803 m)  Weight: 208 lb (94.348 kg)  SpO2: 96%   Body mass index is 29.02 kg/(m^2). Filed Weights   08/27/15 1427  Weight: 208 lb (94.348 kg)     Physical Exam  Constitutional: He is oriented to person, place, and time.  Overweight.  HENT:  Mild loss of hearing bilaterally. 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  Eyes:  Corrective lenses.  Neck: No JVD present. No tracheal deviation present. No thyromegaly present.  Healed incision of the left neck from repair of left carotid.  Cardiovascular: Normal rate, regular rhythm, normal heart sounds and intact distal pulses.  Exam reveals no gallop and no friction rub.   No murmur heard. Pulmonary/Chest: Breath sounds normal. No respiratory distress. He has no wheezes. He has no rales. He exhibits no tenderness.  Abdominal: Soft. Bowel sounds are normal. He exhibits no distension and no mass. There is no tenderness.  Musculoskeletal: Normal range of motion. He exhibits no edema or tenderness.  Lymphadenopathy:    He has no cervical adenopathy.  Neurological: He is alert and oriented to person, place, and time. He has normal reflexes. No cranial nerve deficit. Coordination normal.  08/27/15 MMSE: 30/30. Failed clock drawing.  Skin: Skin is warm and dry. No rash noted. No erythema. No pallor.  Psychiatric: He has a normal mood and affect. His behavior is normal. Judgment and thought content normal.    Labs reviewed: Lab Summary Latest Ref Rng 08/25/2015 02/21/2015 10/25/2014  Hemoglobin 13.0-17.0 g/dL  (None) (None) (None)  Hematocrit 39.0-52.0 % (None) (None) (None)  White count - (None) (None) (None)  Platelet count - (None) (None) (None)  Sodium 134 - 144 mmol/L 139 139 141  Potassium 3.5 - 5.2 mmol/L 4.2 4.0 4.2  Calcium 8.6 - 10.2 mg/dL 8.8 8.7 9.0  Phosphorus - (None) (None) (None)  Creatinine 0.76 - 1.27 mg/dL 1.01 1.06 0.93  AST 0 - 40 IU/L 16 19 (None)  Alk Phos 39 - 117 IU/L 52 51 (None)  Bilirubin 0.0 - 1.2 mg/dL 0.5 0.6 (None)  Glucose 65 - 99 mg/dL 91 91 88  Cholesterol - (None) (None) (None)  HDL cholesterol >39 mg/dL 43 41 44  Triglycerides 0 - 149 mg/dL 103 95 78  LDL Direct - (None) (None) (None)  LDL Calc 0 - 99 mg/dL 129(H) 99 105(H)  Total protein - (None) (None) (None)  Albumin 3.5 - 4.7 g/dL 4.0 4.1 (None)   Lab Results  Component Value Date   BUN 10 08/25/2015   No results found for: HGBA1C Lab Results  Component Value Date   TSH 1.390 10/06/2012   08/27/15 EKG: rate 81. NSR. Normal  Assessment/Plan  1. Essential hypertension Continue current meds. Lose weight. - EKG 12-Lead  2. Hyperlipidemia controlled  3. Insomnia Change doxepin to 75 mg hs  4. Obesity Discussed weihgt loss  5. Edema, unspecified type stable  6. Atherosclerosis of native coronary artery of native heart without angina pectoris asymptomatic  7. BPH (benign prostatic hypertrophy) with urinary obstruction Mild obstructive symptoms. Continue Flomax  8. Dizziness and giddiness Better since using diazepam when symptoms begin

## 2015-08-29 ENCOUNTER — Other Ambulatory Visit: Payer: Self-pay

## 2015-08-29 MED ORDER — DIAZEPAM 5 MG PO TABS: ORAL_TABLET | ORAL | Status: DC

## 2015-09-19 ENCOUNTER — Telehealth: Payer: Self-pay | Admitting: *Deleted

## 2015-09-19 MED ORDER — RAMIPRIL 10 MG PO CAPS: 10.0000 mg | ORAL_CAPSULE | Freq: Every day | ORAL | Status: DC

## 2015-09-19 NOTE — Telephone Encounter (Signed)
Patient notified and medication list updated. Rx faxed to pharmacy.

## 2015-09-19 NOTE — Telephone Encounter (Signed)
Increase ramipril to 10 mg daily. Send prescription for 90 days with 3 refills if needed.

## 2015-09-19 NOTE — Telephone Encounter (Signed)
Patient called and stated that his Ramipril 5mg  was to be increased to 10 mg at his last appointment. Reviewed medication list and last OV but nothing noted of change. Please Advise.

## 2015-10-14 ENCOUNTER — Other Ambulatory Visit: Payer: Self-pay | Admitting: *Deleted

## 2015-10-14 MED ORDER — DIAZEPAM 5 MG PO TABS: ORAL_TABLET | ORAL | Status: DC

## 2015-10-14 NOTE — Telephone Encounter (Signed)
[facility] [place] 

## 2015-11-20 ENCOUNTER — Encounter: Payer: Self-pay | Admitting: Internal Medicine

## 2015-12-07 ENCOUNTER — Other Ambulatory Visit: Payer: Self-pay | Admitting: Nurse Practitioner

## 2016-01-12 IMAGING — CT CT ANGIO NECK
1 of 8 series · 5 of 46 positions shown, 10 images · IV contrast (omnipaque)
Comparison: Cervical spine MRI 07/06/2010.

CLINICAL DATA: 82-year-old male with left carotid artery disease,
progressing. Subsequent encounter.

EXAM:
CT ANGIOGRAPHY NECK
TECHNIQUE: Multidetector CT imaging of the neck was performed using the
standard protocol during bolus administration of intravenous
contrast. Multiplanar CT image reconstructions and MIPs were
obtained to evaluate the vascular anatomy. Carotid stenosis
measurements (when applicable) are obtained utilizing NASCET
criteria, using the distal internal carotid diameter as the
denominator.
CONTRAST:  80mL OMNIPAQUE IOHEXOL 350 MG/ML SOLN

[Series 5: (person_name) 3.0 b30f st · axial · 0.42mm/px · z∈[-232,-76]mm · 5 of 80 slices shown, 10 images]
[im 14/80  soft-tissue]
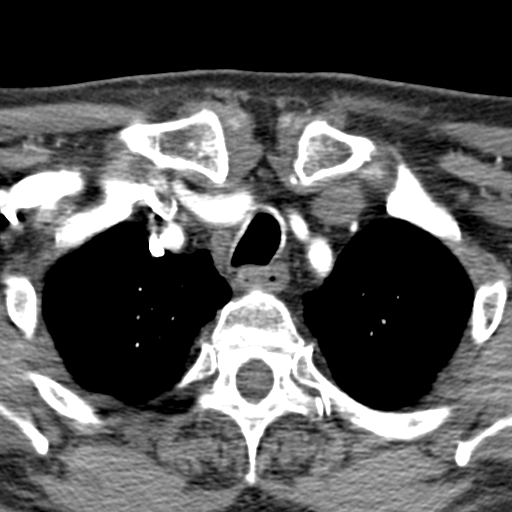
[im 14/80  bone]
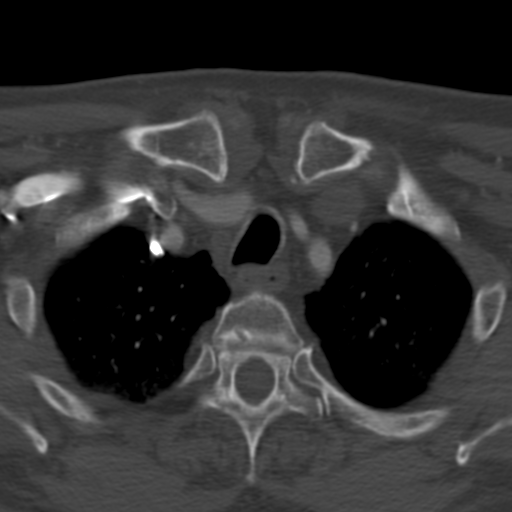
[im 27/80  soft-tissue]
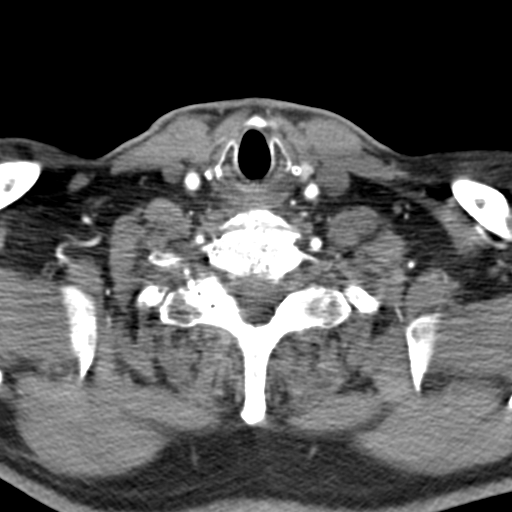
[im 27/80  lung]
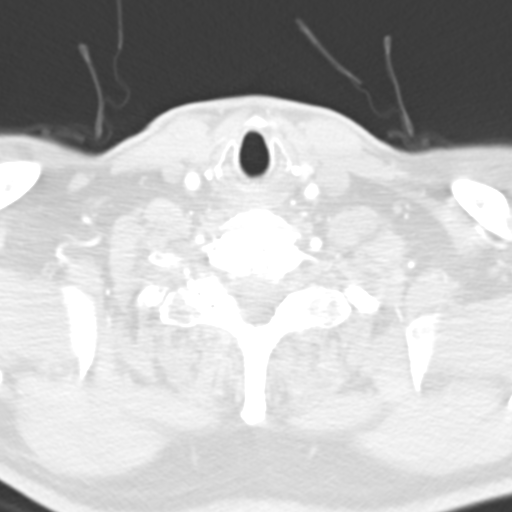
[im 40/80  soft-tissue]
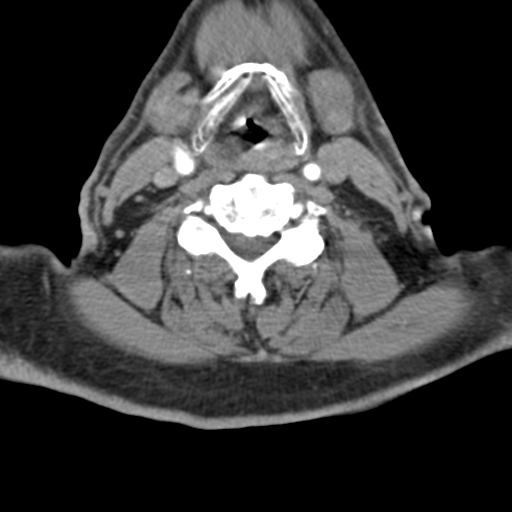
[im 40/80  lung]
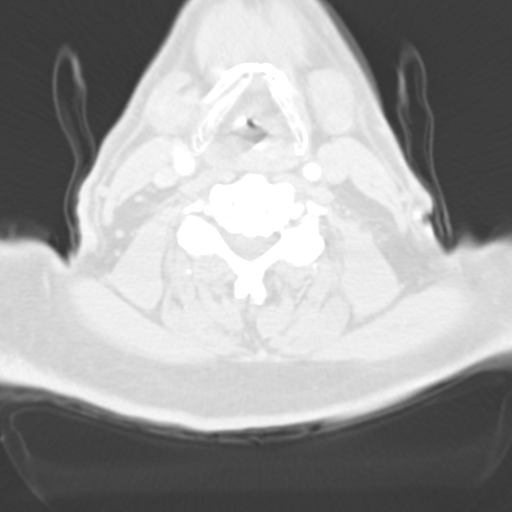
[im 53/80  soft-tissue]
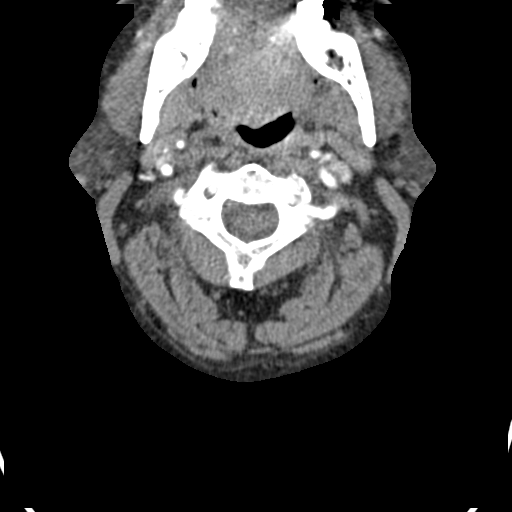
[im 53/80  lung]
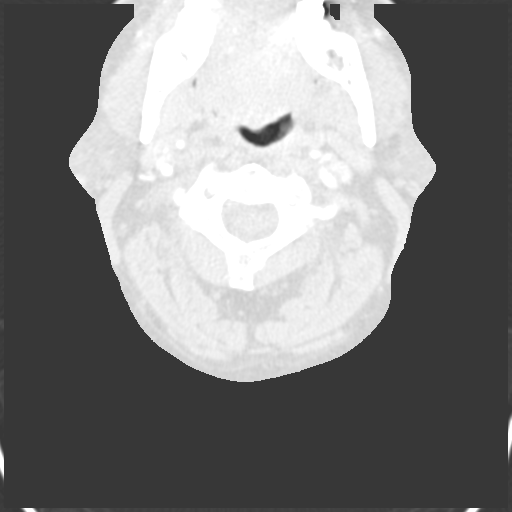
[im 66/80  soft-tissue]
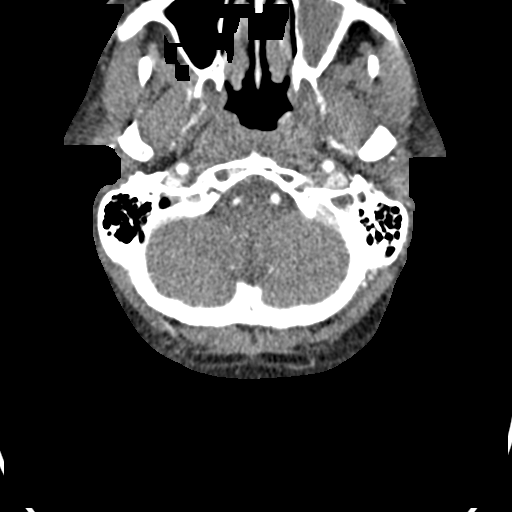
[im 66/80  lung]
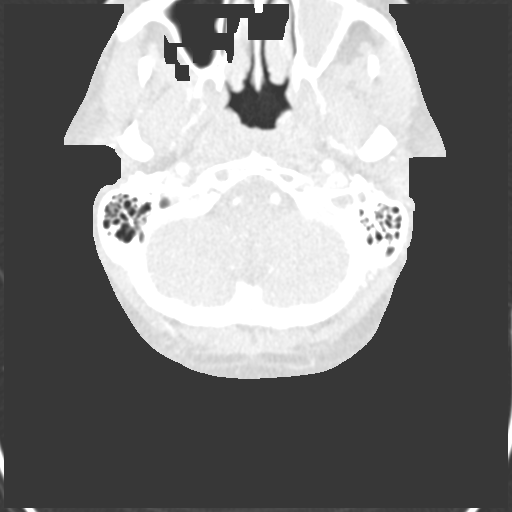

[5 of 46 positions shown; findings below may reference images not displayed]

FINDINGS: Upper lobe emphysema and dependent atelectasis. No superior
mediastinal lymphadenopathy. Sequelae of median sternotomy and CABG
partially visible.

Thyroid, larynx, pharynx (motion artifact), parapharyngeal spaces,
retropharyngeal space, sublingual space, submandibular glands,
parotid glands, and visible orbits soft tissues are within normal
limits. Opacified left maxillary sinus and visible anterior ethmoid
air cells. Mild paranasal sinus mucosal thickening elsewhere.
Tympanic cavities and mastoids are clear. Grossly negative
visualized brain parenchyma. Advanced degenerative changes in the
cervical spine with ankylosis at C4-C5. No acute osseous abnormality
identified. No cervical lymphadenopathy.

VASCULAR FINDINGS:

Aortic arch: 3 vessel arch configuration. Mild to moderate arch
atherosclerosis. No great vessel origin stenosis.

Right carotid system: Tortuous proximally. Motion artifact just
proximal to the right carotid bifurcation. Right ICA origin and bulb
widely patent. Negative cervical right ICA aside from tortuosity.
Patent visible right ICA siphon with calcified plaque.

Left carotid system: Mild left CCA atherosclerosis, mostly soft
plaque. Patent left carotid bifurcation and left ICA origin. In the
distal bulb there is calcified and soft plaque resulting in a
radiographic string sign (series 5, image 33 also seen on thin axial
images series 607, image 60. And sagittal series 606, image 144).
This high-grade stenosis is over a distance of 3-4 mm. Despite this
finding, the left ICA remains patent. It is subsequently tortuous
but otherwise negative to the skullbase. Visible left ICA siphon is
patent with calcified plaque.

Vertebral arteries:

No proximal right subclavian artery stenosis despite soft and
calcified plaque. Right vertebral artery origin within normal
limits. Negative right vertebral artery to the skullbase. It remains
patent intracranially, is diminutive beyond right PICA origin, but
patent to the vertebrobasilar junction. Visible basilar artery is
patent.

No proximal left subclavian artery stenosis despite soft and
calcified plaque. Soft plaque at the left vertebral artery origin,
but no significant stenosis. The left vertebral artery is dominant
throughout the neck, and patent to the vertebrobasilar junction with
no stenosis. Dominant appearing left AICA.
IMPRESSION: 1. Focal high-grade stenosis of the left ICA in the distal bulb;
RADIOGRAPHIC STRING SIGN.
2. Atherosclerosis without other arterial stenosis in the neck.
3. Emphysema. Previous CABG. Advanced cervical spine degenerative
changes.

## 2016-01-21 ENCOUNTER — Other Ambulatory Visit: Payer: Self-pay | Admitting: Internal Medicine

## 2016-03-01 ENCOUNTER — Other Ambulatory Visit: Payer: Commercial Managed Care - HMO

## 2016-03-01 DIAGNOSIS — I10 Essential (primary) hypertension: Secondary | ICD-10-CM

## 2016-03-01 LAB — COMPREHENSIVE METABOLIC PANEL
ALT: 11 U/L (ref 9–46)
AST: 14 U/L (ref 10–35)
Albumin: 3.9 g/dL (ref 3.6–5.1)
Alkaline Phosphatase: 40 U/L (ref 40–115)
BUN: 11 mg/dL (ref 7–25)
CHLORIDE: 104 mmol/L (ref 98–110)
CO2: 26 mmol/L (ref 20–31)
CREATININE: 0.95 mg/dL (ref 0.70–1.11)
Calcium: 8.9 mg/dL (ref 8.6–10.3)
Glucose, Bld: 91 mg/dL (ref 65–99)
POTASSIUM: 4.5 mmol/L (ref 3.5–5.3)
SODIUM: 138 mmol/L (ref 135–146)
Total Bilirubin: 0.5 mg/dL (ref 0.2–1.2)
Total Protein: 6.8 g/dL (ref 6.1–8.1)

## 2016-03-01 LAB — CBC WITH DIFFERENTIAL/PLATELET
BASOS PCT: 0 %
Basophils Absolute: 0 cells/uL (ref 0–200)
EOS ABS: 270 {cells}/uL (ref 15–500)
Eosinophils Relative: 3 %
HEMATOCRIT: 43.8 % (ref 38.5–50.0)
HEMOGLOBIN: 14.6 g/dL (ref 13.2–17.1)
LYMPHS ABS: 2340 {cells}/uL (ref 850–3900)
LYMPHS PCT: 26 %
MCH: 32.7 pg (ref 27.0–33.0)
MCHC: 33.3 g/dL (ref 32.0–36.0)
MCV: 98 fL (ref 80.0–100.0)
MONO ABS: 540 {cells}/uL (ref 200–950)
MPV: 10 fL (ref 7.5–12.5)
Monocytes Relative: 6 %
NEUTROS PCT: 65 %
Neutro Abs: 5850 cells/uL (ref 1500–7800)
Platelets: 250 10*3/uL (ref 140–400)
RBC: 4.47 MIL/uL (ref 4.20–5.80)
RDW: 13.6 % (ref 11.0–15.0)
WBC: 9 10*3/uL (ref 3.8–10.8)

## 2016-03-03 ENCOUNTER — Encounter: Payer: Self-pay | Admitting: Internal Medicine

## 2016-03-03 ENCOUNTER — Ambulatory Visit (INDEPENDENT_AMBULATORY_CARE_PROVIDER_SITE_OTHER): Payer: Commercial Managed Care - HMO | Admitting: Internal Medicine

## 2016-03-03 VITALS — BP 132/82 | HR 74 | Temp 97.6°F | Ht 71.0 in | Wt 210.0 lb

## 2016-03-03 DIAGNOSIS — G4700 Insomnia, unspecified: Secondary | ICD-10-CM | POA: Diagnosis not present

## 2016-03-03 DIAGNOSIS — I10 Essential (primary) hypertension: Secondary | ICD-10-CM

## 2016-03-03 DIAGNOSIS — E785 Hyperlipidemia, unspecified: Secondary | ICD-10-CM

## 2016-03-03 DIAGNOSIS — M25562 Pain in left knee: Secondary | ICD-10-CM

## 2016-03-03 DIAGNOSIS — M25561 Pain in right knee: Secondary | ICD-10-CM | POA: Diagnosis not present

## 2016-03-03 DIAGNOSIS — G8929 Other chronic pain: Secondary | ICD-10-CM

## 2016-03-03 NOTE — Progress Notes (Signed)
Facility  [facility]    Place of Service:   OFFICE    No Known Allergies  Chief Complaint  Patient presents with  . Medical Management of Chronic Issues    6 month medication management blood pressure, cholesterol, insomnia, review labs    HPI:   Essential hypertension controlled  Hyperlipidemia, unspecified hyperlipidemia type controlled  Insomnia, unspecified type Unchanged. Wakeful some nights.  Chronic pain of both knees Improved since he started using apple cider vinegar   Medications: Patient's Medications  New Prescriptions   No medications on file  Previous Medications   ASPIRIN 81 MG TABLET    Take 81 mg by mouth daily. Take one tablet once a day   B COMPLEX VITAMINS TABLET    Take 1 tablet by mouth daily.   CHOLECALCIFEROL (VITAMIN D) 1000 UNITS TABLET    Take 1,000 Units by mouth daily.    CINNAMON 500 MG TABS    Take 1 tablet by mouth daily.   CITRUS BERGAMOT PO    Take 1 capsule by mouth daily. Take one capsule once daily to lower cholesterol   COENZYME Q10 (CO Q 10) 100 MG CAPS    Take 1 capsule by mouth daily.   DIAZEPAM (VALIUM) 5 MG TABLET    take 1/2 tablet by mouth UP to twice a day if needed for anxiety   DOXEPIN (SINEQUAN) 75 MG CAPSULE    One nightly to help nerves and for sleep   FISH OIL-OMEGA-3 FATTY ACIDS 1000 MG CAPSULE    Take 2 g by mouth daily. Take one tablet once a day for cholesterol   FLUTICASONE-SALMETEROL (ADVAIR DISKUS) 250-50 MCG/DOSE AEPB    inhale 1 puff by mouth twice a day   IBUPROFEN (ADVIL,MOTRIN) 200 MG TABLET    Take 200 mg by mouth every 6 (six) hours as needed for pain. Take 3-4 tablets twice a day for neck pain as needed   NUTRITIONAL SUPPLEMENTS (NUTRITIONAL SUPPLEMENT PO)    Synerflex. Take 2 tablets by mouth daily.   NUTRITIONAL SUPPLEMENTS (NUTRITIONAL SUPPLEMENT PO)    Ginger and Turmeric. Take 1 capsule by mouth daily.   POTASSIUM 99 MG TABS    Take 1 tablet by mouth daily. Take one tablet once a day for cramps   RAMIPRIL (ALTACE) 10 MG CAPSULE    Take 1 capsule (10 mg total) by mouth daily.   RESVERATROL 100 MG CAPS    Take 1 capsule by mouth daily. Take one tablet once a day  Modified Medications   No medications on file  Discontinued Medications   ATORVASTATIN (LIPITOR) 20 MG TABLET    One daily to lower cholesterol   LORAZEPAM (ATIVAN) 2 MG TABLET    Take one tablet by mouth every 6 hours as needed for anxiety   OXYCODONE-ACETAMINOPHEN (PERCOCET/ROXICET) 5-325 MG PER TABLET    Take 1 tablet by mouth every 6 (six) hours as needed.   TAMSULOSIN (FLOMAX) 0.4 MG CAPS CAPSULE    Take 1 capsule (0.4 mg total) by mouth daily.    Review of Systems  Constitutional: Negative for activity change, appetite change, fatigue, fever and unexpected weight change.  HENT: Positive for hearing loss. Negative for ear discharge.   Eyes: Negative.   Respiratory: Negative.  Negative for shortness of breath.   Cardiovascular: Positive for leg swelling. Negative for chest pain.       Left carotid stenosis  Gastrointestinal:       Occasional heartburn which seems well controlled.  History of hemorrhoids.  Endocrine: Negative.   Genitourinary:       History of kidney stones. Nocturia. Hesitation Obstruction occurred after surgery 06/14/14. Foley Catheter was removed and he is voiding well. Used Flomax 2 daily, but is off this medication now.  Musculoskeletal: Positive for arthralgias and back pain. Negative for neck pain and neck stiffness.       Leg cramps in the past  Skin: Negative.   Neurological:       Episodes of dizziness. None recent. Had an episode of syncope after severe coughing.  Hematological: Negative.   Psychiatric/Behavioral: Positive for self-injury and sleep disturbance. Negative for decreased concentration.       Mild insomnia.    Vitals:   03/03/16 1207  BP: 132/82  Pulse: 74  Temp: 97.6 F (36.4 C)  TempSrc: Oral  SpO2: 95%  Weight: 210 lb (95.3 kg)  Height: 5' 11" (1.803 m)   Body  mass index is 29.29 kg/m. Wt Readings from Last 3 Encounters:  03/03/16 210 lb (95.3 kg)  08/27/15 208 lb (94.3 kg)  07/07/15 208 lb 1.6 oz (94.4 kg)      Physical Exam  Constitutional: He is oriented to person, place, and time.  Overweight.  HENT:  Mild loss of hearing bilaterally. 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  Eyes:  Corrective lenses.  Neck: No JVD present. No tracheal deviation present. No thyromegaly present.  Healed incision of the left neck from repair of left carotid.  Cardiovascular: Normal rate, regular rhythm, normal heart sounds and intact distal pulses.  Exam reveals no gallop and no friction rub.   No murmur heard. Pulmonary/Chest: Breath sounds normal. No respiratory distress. He has no wheezes. He has no rales. He exhibits no tenderness.  Abdominal: Soft. Bowel sounds are normal. He exhibits no distension and no mass. There is no tenderness.  Musculoskeletal: Normal range of motion. He exhibits no edema or tenderness.  Lymphadenopathy:    He has no cervical adenopathy.  Neurological: He is alert and oriented to person, place, and time. He has normal reflexes. No cranial nerve deficit. Coordination normal.  08/27/15 MMSE: 30/30. Failed clock drawing.  Skin: Skin is warm and dry. No rash noted. No erythema. No pallor.  Psychiatric: He has a normal mood and affect. His behavior is normal. Judgment and thought content normal.    Labs reviewed: Lab Summary Latest Ref Rng & Units 03/01/2016 08/25/2015 02/21/2015  Hemoglobin 13.2 - 17.1 g/dL 14.6 (None) (None)  Hematocrit 38.5 - 50.0 % 43.8 (None) (None)  White count 3.8 - 10.8 K/uL 9.0 (None) (None)  Platelet count 140 - 400 K/uL 250 (None) (None)  Sodium 135 - 146 mmol/L 138 139 139  Potassium 3.5 - 5.3 mmol/L 4.5 4.2 4.0  Calcium 8.6 - 10.3 mg/dL 8.9 8.8 8.7  Phosphorus - (None) (None) (None)  Creatinine 0.70 - 1.11 mg/dL 0.95 1.01 1.06  AST 10 - 35 U/L _0 Alk Phos 40 - 115 U/L 40 52 51   Bilirubin 0.2 - 1.2 mg/dL 0.5 0.5 0.6  Glucose 65 - 99 mg/dL 91 91 91  Cholesterol - (None) (None) (None)  HDL cholesterol >39 mg/dL (None) 43 41  Triglycerides 0 - 149 mg/dL (None) 103 95  LDL Direct - (None) (None) (None)  LDL Calc 0 - 99 mg/dL (None) 129(H) 99  Total protein 6.1 - 8.1 g/dL 6.8 (None) (None)  Albumin 3.6 - 5.1 g/dL 3.9 4.0 4.1  Some recent  data might be hidden   Lab Results  Component Value Date   TSH 1.390 10/06/2012   Lab Results  Component Value Date   BUN 11 03/01/2016   BUN 10 08/25/2015   BUN 11 02/21/2015   No results found for: HGBA1C  Assessment/Plan  1. Essential hypertension - Comprehensive metabolic panel; Future  2. Hyperlipidemia, unspecified hyperlipidemia type - Lipid panel; Future  3. Insomnia, unspecified type Continue Sinequan  4. Chronic pain of both knees improved

## 2016-03-08 ENCOUNTER — Other Ambulatory Visit: Payer: Self-pay | Admitting: Internal Medicine

## 2016-04-28 ENCOUNTER — Other Ambulatory Visit: Payer: Self-pay | Admitting: Internal Medicine

## 2016-05-25 ENCOUNTER — Ambulatory Visit: Payer: Commercial Managed Care - HMO | Admitting: Nurse Practitioner

## 2016-05-25 ENCOUNTER — Encounter: Payer: Self-pay | Admitting: Nurse Practitioner

## 2016-05-25 ENCOUNTER — Ambulatory Visit (INDEPENDENT_AMBULATORY_CARE_PROVIDER_SITE_OTHER): Payer: Medicare HMO | Admitting: Nurse Practitioner

## 2016-05-25 VITALS — BP 150/70 | HR 94 | Temp 98.1°F | Resp 18 | Ht 71.0 in | Wt 212.6 lb

## 2016-05-25 DIAGNOSIS — M26609 Unspecified temporomandibular joint disorder, unspecified side: Secondary | ICD-10-CM

## 2016-05-25 MED ORDER — METAXALONE 400 MG PO TABS: 400.0000 mg | ORAL_TABLET | Freq: Three times a day (TID) | ORAL | 0 refills | Status: DC | PRN

## 2016-05-25 MED ORDER — NAPROXEN 250 MG PO TABS: 250.0000 mg | ORAL_TABLET | Freq: Two times a day (BID) | ORAL | 0 refills | Status: DC

## 2016-05-25 NOTE — Progress Notes (Signed)
Careteam: Patient Care Team: Estill Dooms, MD as PCP - General (Internal Medicine) Lorretta Harp, MD as Consulting Physician (Cardiology) Serafina Mitchell, MD as Consulting Physician (Vascular Surgery) Shon Hough, MD as Consulting Physician (Ophthalmology) Bjorn Loser, MD as Consulting Physician (Urology) Shon Hough, MD as Consulting Physician (Ophthalmology)  Advanced Directive information Does Patient Have a Medical Advance Directive?: Yes, Type of Advance Directive: [place];Living will  No Known Allergies  Chief Complaint  Patient presents with  . Acute Visit    Right sided jaw pain/swelling x 4 days. Cannot open mouth without it hurting but is not tooth pain. Not sure of  cause.      HPI: Patient is a 83 y.o. male seen in the office today due to right sided jaw pain/swelling x4 days. Reports it hurts to talk and his bottom lip feels numb. Symptoms have never occurred before. Progressively worse. Takes Ibuprofen 400 mg Q3H and it goes away for 2-3 hours. Unable to open mouth, and therefore unable to eat. No recent trauma or injury. Does not see a dentist.  Reports he had surgery on his carotid artery on the left side before that resulted in numbness of the left side of his bottom lip. This numbness is his entire bottom lip.  Does not think he grinds his teeth at night. No injury noted. Had cough last week.  Reports pain radiates up into head.  No pain to gums or teeth.  Review of Systems: see HPI Review of Systems  Constitutional: Negative for chills and fever.  HENT: Positive for facial swelling. Negative for ear discharge, ear pain and hearing loss.   Respiratory: Negative for cough and shortness of breath.   Cardiovascular: Negative for chest pain and palpitations.  Neurological: Positive for headaches. Negative for dizziness, tremors, syncope, facial asymmetry, speech difficulty, weakness and light-headedness.    Past Medical  History:  Diagnosis Date  . Acquired deformity of nose   . Calculus of kidney   . Carotid artery disease ([place])   . Cellulitis and abscess of unspecified site   . Cervicalgia   . CHF (congestive heart failure) ([place])   . Coronary atherosclerosis of unspecified type of vessel, native or graft   . Dizziness and giddiness   . Encounter for long-term (current) use of other medications   . Esophagitis, unspecified   . Hyperlipidemia   . Hypertrophy of prostate with urinary obstruction and other lower urinary tract symptoms (LUTS)   . Insomnia, unspecified   . Internal hemorrhoids without mention of complication   . Obesity, unspecified   . Old myocardial infarction   . Other and unspecified hyperlipidemia   . Other premature beats   . Palpitations   . Shortness of breath   . Slowing of urinary stream   . Special screening for malignant neoplasm of prostate   . Unspecified essential hypertension   . 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 Unspecified vitamin D deficiency   . Urinary frequency    Past Surgical History:  Procedure Laterality Date  . CABG X 4  2000  . CARDIAC CATHETERIZATION    . CAROTID ENDARTERECTOMY Left 06-14-14   CE  . COLONOSCOPY  2007   Dr Lajoyce Corners, normal  . CORONARY ARTERY BYPASS GRAFT     2000  . ENDARTERECTOMY Left 06/14/2014   Procedure: LEFT CAROTID ENDARTERECTOMY WITH  VASCUGUARD PATCH ANGIOPLASTY;  Surgeon: Serafina Mitchell, MD;  Location: [facility] OR;  Service: Vascular;  Laterality: Left;  . ESOPHAGOGASTRODUODENOSCOPY ENDOSCOPY  2007   Dr Lajoyce Corners, with biopsy  . EXCISIONAL HEMORRHOIDECTOMY  1981  . LESION FROM FOREHEAD,(R) EAR (L) ARM     DR Almira Coaster  . LITHOTRIPSY  2005   Dr Almira Coaster  . TONSILLECTOMY  1940   Social History:   reports that he quit smoking about 43 years ago. His smoking use included Cigarettes. He has a 33.00 pack-year smoking history. He  has never used smokeless tobacco. He reports that he drinks about 3.6 oz of alcohol per week . He reports that he does not use drugs.  Family History  Problem Relation Age of Onset  . Cancer Mother     LIVER  . Cancer Brother     PROSTATE  . Cancer Daughter     Medications: Patient's Medications  New Prescriptions   No medications on file  Previous Medications   ASPIRIN 81 MG TABLET    Take 81 mg by mouth daily. Take one tablet once a day   B COMPLEX VITAMINS TABLET    Take 1 tablet by mouth daily.   CHOLECALCIFEROL (VITAMIN D) 1000 UNITS TABLET    Take 1,000 Units by mouth daily.    CINNAMON 500 MG TABS    Take 1 tablet by mouth daily.   CITRUS BERGAMOT PO    Take 1 capsule by mouth daily. Take one capsule once daily to lower cholesterol   COENZYME Q10 (CO Q 10) 100 MG CAPS    Take 1 capsule by mouth daily.   DIAZEPAM (VALIUM) 5 MG TABLET    take 1/2 tablet by mouth UP TO TWICE A DAY IF NEEDED FOR ANXIETY   DOXEPIN (SINEQUAN) 75 MG CAPSULE    One nightly to help nerves and for sleep   FISH OIL-OMEGA-3 FATTY ACIDS 1000 MG CAPSULE    Take 2 g by mouth daily. Take one tablet once a day for cholesterol   FLUTICASONE-SALMETEROL (ADVAIR DISKUS) 250-50 MCG/DOSE AEPB    inhale 1 puff by mouth twice a day   IBUPROFEN (ADVIL,MOTRIN) 200 MG TABLET    Take 200 mg by mouth every 6 (six) hours as needed for pain. Take 3-4 tablets twice a day for neck pain as needed   NUTRITIONAL SUPPLEMENTS (NUTRITIONAL SUPPLEMENT PO)    Synerflex. Take 2 tablets by mouth daily.   NUTRITIONAL SUPPLEMENTS (NUTRITIONAL SUPPLEMENT PO)    Ginger and Turmeric. Take 1 capsule by mouth daily.   POTASSIUM 99 MG TABS    Take 1 tablet by mouth daily. Take one tablet once a day for cramps   RAMIPRIL (ALTACE) 10 MG CAPSULE    Take 1 capsule (10 mg total) by mouth daily.   RESVERATROL 100 MG CAPS    Take 1 capsule by mouth daily. Take one tablet once a day  Modified Medications   No medications on file  Discontinued  Medications   No medications on file     Physical Exam:  Vitals:   05/25/16 1042  BP: (!) 150/70  Pulse: 94  Resp: 18  Temp: 98.1 F (36.7 C)  TempSrc: Oral  SpO2: 97%  Weight: 212 lb 9.6 oz (96.4 kg)  Height: 5\' 11"  (1.803 m)   Body mass index is 29.65 kg/m.  Physical Exam  Constitutional: He is oriented to person, place, and time. He appears well-developed and well-nourished.  HENT:  Head: Normocephalic and atraumatic.  Minimal ability to open mouth.  Swelling noted to right side of face/cheek area. No enlarged glands or lymph nodes. TMJ pain. Small ulcer to inside right cheek - patient states he bit his cheek.  No tenderness to gum or teeth Poor dentition   Eyes: Conjunctivae are normal. Pupils are equal, round, and reactive to light.  Neck: Normal range of motion. Neck supple. No JVD present. No tracheal deviation present. No thyromegaly present.  Cardiovascular: Normal rate, regular rhythm and normal heart sounds.   Pulmonary/Chest: Effort normal and breath sounds normal.  Musculoskeletal: Normal range of motion.  Lymphadenopathy:    He has no cervical adenopathy.  Neurological: He is alert and oriented to person, place, and time.  Skin: Skin is warm and dry.  Psychiatric: He has a normal mood and affect. Judgment and thought content normal.    Labs reviewed: Basic Metabolic Panel:  Recent Labs  08/25/15 0849 03/01/16 0857  NA 139 138  K 4.2 4.5  CL 100 104  CO2 24 26  GLUCOSE 91 91  BUN 10 11  CREATININE 1.01 0.95  CALCIUM 8.8 8.9   Liver Function Tests:  Recent Labs  08/25/15 0849 03/01/16 0857  AST 16 14  ALT 12 11  ALKPHOS 52 40  BILITOT 0.5 0.5  PROT 6.8 6.8  ALBUMIN 4.0 3.9   No results for input(s): LIPASE, AMYLASE in the last 8760 hours. No results for input(s): AMMONIA in the last 8760 hours. CBC:  Recent Labs  03/01/16 0857  WBC 9.0  NEUTROABS 5,850  HGB 14.6  HCT 43.8  MCV 98.0  PLT 250   Lipid Panel:  Recent  Labs  08/25/15 0849  CHOL 193  HDL 43  LDLCALC 129*  TRIG 103  CHOLHDL 4.5   TSH: No results for input(s): TSH in the last 8760 hours. A1C: No results found for: HGBA1C   Assessment/Plan 1. TMJ (temporomandibular joint syndrome) - Make appointment with dentist for further work-up ASAP - Try to rest the jaw (limit talking)  - Eat soft foods that do not require a lot of chewing - Education provided on TMJ syndrome - naproxen (NAPROSYN) 250 MG tablet; Take 1 tablet (250 mg total) by mouth 2 (two) times daily with a meal x7 days.  Dispense: 14 tablet; Refill: 0- not to use any other NSAIDS while taking naproxen - metaxalone (SKELAXIN) 400 MG tablet; Take 1 tablet (400 mg total) by mouth 3 (three) times daily as needed (pain).  Dispense: 21 tablet; Refill: 0 (to use cautiously advised of advertise effects of muscle relaxer )    Keep scheduled follow-up with Dr. Nyoka Cowden. Notify if you are unable to get an appointment with a dentist and your symptoms worsen.  Alex [name]. Alex [name]  [facility] [phone] 8 am - 5 pm) [phone] (after hours)

## 2016-05-25 NOTE — Patient Instructions (Addendum)
Naproxen 250 mg by mouth twice a day with a meal x7 days. Do not take with any other NSAID (ibuprofen, aleve, motrin, advil).  Skelaxin (muscle relaxer) 400 mg by mouth every 8 hours as needed for pain.  Make appointment with dentist   Temporomandibular Joint Syndrome Temporomandibular joint (TMJ) syndrome is a condition that affects the joints between your jaw and your skull. The TMJs are located near your ears and allow your jaw to open and close. These joints and the nearby muscles are involved in all movements of the jaw. People with TMJ syndrome have pain in the area of these joints and muscles. Chewing, biting, or other movements of the jaw can be difficult or painful. TMJ syndrome can be caused by various things. In many cases, the condition is mild and goes away within a few weeks. For some people, the condition can become a long-term problem. What are the causes? Possible causes of TMJ syndrome include:  Grinding your teeth or clenching your jaw. Some people do this when they are under stress.  Arthritis.  Injury to the jaw.  Head or neck injury.  Teeth or dentures that are not aligned well. In some cases, the cause of TMJ syndrome may not be known. What are the signs or symptoms? The most common symptom is an aching pain on the side of the head in the area of the TMJ. Other symptoms may include:  Pain when moving your jaw, such as when chewing or biting.  Being unable to open your jaw all the way.  Making a clicking sound when you open your mouth.  Headache.  Earache.  Neck or shoulder pain. How is this diagnosed? Diagnosis can usually be made based on your symptoms, your medical history, and a physical exam. Your health care provider may check the range of motion of your jaw. Imaging tests, such as X-rays or an MRI, are sometimes done. You may need to see your dentist to determine if your teeth and jaw are lined up correctly. How is this treated? TMJ syndrome often  goes away on its own. If treatment is needed, the options may include:  Eating soft foods and applying ice or heat.  Medicines to relieve pain or inflammation.  Medicines to relax the muscles.  A splint, bite plate, or mouthpiece to prevent teeth grinding or jaw clenching.  Relaxation techniques or counseling to help reduce stress.  Transcutaneous electrical nerve stimulation (TENS). This helps to relieve pain by applying an electrical current through the skin.  Acupuncture. This is sometimes helpful to relieve pain.  Jaw surgery. This is rarely needed. Follow these instructions at home:  Take medicines only as directed by your health care provider.  Eat a soft diet if you are having trouble chewing.  Apply ice to the painful area.  Put ice in a plastic bag.  Place a towel between your skin and the bag.  Leave the ice on for 20 minutes, 2-3 times a day.  Apply a warm compress to the painful area as directed.  Massage your jaw area and perform any jaw stretching exercises as recommended by your health care provider.  If you were given a mouthpiece or bite plate, wear it as directed.  Avoid foods that require a lot of chewing. Do not chew gum.  Keep all follow-up visits as directed by your health care provider. This is important. Contact a health care provider if:  You are having trouble eating.  You have new or worsening  symptoms. Get help right away if:  Your jaw locks open or closed. This information is not intended to replace advice given to you by your health care provider. Make sure you discuss any questions you have with your health care provider. Document Released: 01/12/2001 Document Revised: 12/18/2015 Document Reviewed: 11/22/2013 Elsevier Interactive Patient Education  2017 [employer].

## 2016-05-26 ENCOUNTER — Telehealth: Payer: Self-pay | Admitting: *Deleted

## 2016-05-26 MED ORDER — METHOCARBAMOL 500 MG PO TABS: ORAL_TABLET | ORAL | 0 refills | Status: DC

## 2016-05-26 NOTE — Telephone Encounter (Signed)
Patient called and stated that Alex [name] prescribed Skelaxin. Pharmacy didn't have it. Patient wonders if you will prescribe Flexeril instead. Please Advise.

## 2016-05-26 NOTE — Telephone Encounter (Signed)
Patient notified and agreed. Rx faxed to pharmacy.  

## 2016-05-26 NOTE — Telephone Encounter (Signed)
Lets do Robaxin 500 mg by mouth every 8 hours as needed for muscle relaxer #21/0 refills

## 2016-05-30 ENCOUNTER — Emergency Department (HOSPITAL_COMMUNITY)
Admission: EM | Admit: 2016-05-30 | Discharge: 2016-05-30 | Disposition: A | Discharge status: Other Acute Inpatient Hospital | Payer: Medicare HMO | Attending: Emergency Medicine | Admitting: Emergency Medicine

## 2016-05-30 ENCOUNTER — Encounter (HOSPITAL_COMMUNITY): Payer: Self-pay | Admitting: Emergency Medicine

## 2016-05-30 ENCOUNTER — Emergency Department (HOSPITAL_COMMUNITY): Payer: Medicare HMO

## 2016-05-30 DIAGNOSIS — I469 Cardiac arrest, cause unspecified: Secondary | ICD-10-CM | POA: Diagnosis not present

## 2016-05-30 DIAGNOSIS — Z87891 Personal history of nicotine dependence: Secondary | ICD-10-CM | POA: Diagnosis not present

## 2016-05-30 DIAGNOSIS — K08409 Partial loss of teeth, unspecified cause, unspecified class: Secondary | ICD-10-CM | POA: Diagnosis not present

## 2016-05-30 DIAGNOSIS — R1310 Dysphagia, unspecified: Secondary | ICD-10-CM | POA: Diagnosis not present

## 2016-05-30 DIAGNOSIS — I110 Hypertensive heart disease with heart failure: Secondary | ICD-10-CM | POA: Diagnosis not present

## 2016-05-30 DIAGNOSIS — I252 Old myocardial infarction: Secondary | ICD-10-CM | POA: Insufficient documentation

## 2016-05-30 DIAGNOSIS — K029 Dental caries, unspecified: Secondary | ICD-10-CM | POA: Diagnosis not present

## 2016-05-30 DIAGNOSIS — L0201 Cutaneous abscess of face: Secondary | ICD-10-CM

## 2016-05-30 DIAGNOSIS — Z951 Presence of aortocoronary bypass graft: Secondary | ICD-10-CM | POA: Diagnosis not present

## 2016-05-30 DIAGNOSIS — R220 Localized swelling, mass and lump, head: Secondary | ICD-10-CM | POA: Diagnosis not present

## 2016-05-30 DIAGNOSIS — R6884 Jaw pain: Secondary | ICD-10-CM | POA: Diagnosis present

## 2016-05-30 DIAGNOSIS — Z79899 Other long term (current) drug therapy: Secondary | ICD-10-CM | POA: Insufficient documentation

## 2016-05-30 DIAGNOSIS — I2510 Atherosclerotic heart disease of native coronary artery without angina pectoris: Secondary | ICD-10-CM | POA: Diagnosis not present

## 2016-05-30 DIAGNOSIS — I509 Heart failure, unspecified: Secondary | ICD-10-CM | POA: Diagnosis not present

## 2016-05-30 DIAGNOSIS — R252 Cramp and spasm: Secondary | ICD-10-CM | POA: Diagnosis not present

## 2016-05-30 DIAGNOSIS — I10 Essential (primary) hypertension: Secondary | ICD-10-CM | POA: Diagnosis not present

## 2016-05-30 DIAGNOSIS — K047 Periapical abscess without sinus: Secondary | ICD-10-CM | POA: Diagnosis not present

## 2016-05-30 DIAGNOSIS — F419 Anxiety disorder, unspecified: Secondary | ICD-10-CM | POA: Diagnosis not present

## 2016-05-30 DIAGNOSIS — F329 Major depressive disorder, single episode, unspecified: Secondary | ICD-10-CM | POA: Diagnosis not present

## 2016-05-30 DIAGNOSIS — Z7982 Long term (current) use of aspirin: Secondary | ICD-10-CM | POA: Insufficient documentation

## 2016-05-30 DIAGNOSIS — L0211 Cutaneous abscess of neck: Secondary | ICD-10-CM | POA: Diagnosis not present

## 2016-05-30 DIAGNOSIS — B957 Other staphylococcus as the cause of diseases classified elsewhere: Secondary | ICD-10-CM | POA: Diagnosis not present

## 2016-05-30 DIAGNOSIS — K122 Cellulitis and abscess of mouth: Secondary | ICD-10-CM | POA: Diagnosis not present

## 2016-05-30 DIAGNOSIS — Z4659 Encounter for fitting and adjustment of other gastrointestinal appliance and device: Secondary | ICD-10-CM | POA: Diagnosis not present

## 2016-05-30 LAB — CBC WITH DIFFERENTIAL/PLATELET
BASOS ABS: 0 10*3/uL (ref 0.0–0.1)
Basophils Relative: 0 %
EOS ABS: 0.2 10*3/uL (ref 0.0–0.7)
EOS PCT: 2 %
HCT: 37.3 % — ABNORMAL LOW (ref 39.0–52.0)
HEMOGLOBIN: 12.8 g/dL — AB (ref 13.0–17.0)
LYMPHS PCT: 9 %
Lymphs Abs: 1.4 10*3/uL (ref 0.7–4.0)
MCH: 32.5 pg (ref 26.0–34.0)
MCHC: 34.3 g/dL (ref 30.0–36.0)
MCV: 94.7 fL (ref 78.0–100.0)
Monocytes Absolute: 1.6 10*3/uL — ABNORMAL HIGH (ref 0.1–1.0)
Monocytes Relative: 10 %
NEUTROS PCT: 79 %
Neutro Abs: 12.3 10*3/uL — ABNORMAL HIGH (ref 1.7–7.7)
PLATELETS: 279 10*3/uL (ref 150–400)
RBC: 3.94 MIL/uL — AB (ref 4.22–5.81)
RDW: 12.9 % (ref 11.5–15.5)
WBC: 15.5 10*3/uL — AB (ref 4.0–10.5)

## 2016-05-30 LAB — BASIC METABOLIC PANEL
Anion gap: 11 (ref 5–15)
BUN: 13 mg/dL (ref 6–20)
CHLORIDE: 96 mmol/L — AB (ref 101–111)
CO2: 24 mmol/L (ref 22–32)
Calcium: 8.8 mg/dL — ABNORMAL LOW (ref 8.9–10.3)
Creatinine, Ser: 1.03 mg/dL (ref 0.61–1.24)
GFR calc Af Amer: >60 mL/min (ref >60)
Glucose, Bld: 105 mg/dL — ABNORMAL HIGH (ref 65–99)
POTASSIUM: 3.8 mmol/L (ref 3.5–5.1)
SODIUM: 131 mmol/L — AB (ref 135–145)

## 2016-05-30 LAB — I-STAT CG4 LACTIC ACID, ED: LACTIC ACID, VENOUS: 1.58 mmol/L (ref 0.5–1.9)

## 2016-05-30 MED ORDER — MORPHINE SULFATE (PF) 4 MG/ML IV SOLN
4.0000 mg | Freq: Once | INTRAVENOUS | Status: AC
  Administered 2016-05-30: 4 mg via INTRAVENOUS
  Filled 2016-05-30: qty 1

## 2016-05-30 MED ORDER — SODIUM CHLORIDE 0.9 % IV BOLUS (SEPSIS)
1000.0000 mL | Freq: Once | INTRAVENOUS | Status: AC
  Administered 2016-05-30: 1000 mL via INTRAVENOUS

## 2016-05-30 MED ORDER — SODIUM CHLORIDE 0.9 % IV SOLN: 1.5000 g | Freq: Once | INTRAVENOUS | Status: DC

## 2016-05-30 MED ORDER — SODIUM CHLORIDE 0.9 % IV BOLUS (SEPSIS): 500.0000 mL | Freq: Once | INTRAVENOUS | Status: DC

## 2016-05-30 MED ORDER — SODIUM CHLORIDE 0.9 % IV SOLN
3.0000 g | Freq: Once | INTRAVENOUS | Status: AC
  Administered 2016-05-30: 3 g via INTRAVENOUS
  Filled 2016-05-30 (×2): qty 3

## 2016-05-30 MED ORDER — IOPAMIDOL (ISOVUE-300) INJECTION 61%
INTRAVENOUS | Status: AC
  Administered 2016-05-30: 75 mL
  Filled 2016-05-30: qty 75

## 2016-05-30 MED ORDER — IOPAMIDOL (ISOVUE-300) INJECTION 61%
INTRAVENOUS | Status: AC
  Filled 2016-05-30: qty 30

## 2016-05-30 NOTE — ED Notes (Signed)
Pt departed in NAD, in care of [facility].  

## 2016-05-30 NOTE — ED Notes (Addendum)
Dark Green tube was sent to lab earlier.  Dr. Venora Maples to change order for lactic acid to POC.

## 2016-05-30 NOTE — ED Provider Notes (Signed)
[facility] DEPT Provider Note   CSN: [id number] Arrival date & time: 05/30/16  1311     History   Chief Complaint Chief Complaint  Patient presents with  . Jaw Pain    HPI SAMER BOTBYL is a 83 y.o. male who presents with right sided jaw pain and swelling. PMHx significant for CAD, esophagitis, HLD, HTN, He states that over the past 10 days he has had pain on the right side of his jaw. Over the past week he has had mild swelling. He went to see his PCP on 1/23 who diagnosed him with TMJ syndrome. He was given Naproxen and Robaxin which has been taking with minimal relief. The swelling has worsened in the past day and his son encouraged him to come to the ED. He has been unable to eat anything other than soups and soft foods because he can't open his jaw and it's painful. Denies fevers. Has not seen a dentist in several years. No difficulty swallowing just difficulty opening his mouth. No SOB. Also reports some drooling and numbness of the right lip.  HPI  Past Medical History:  Diagnosis Date  . Acquired deformity of nose   . Calculus of kidney   . Carotid artery disease ([place])   . Cellulitis and abscess of unspecified site   . Cervicalgia   . CHF (congestive heart failure) ([place])   . Coronary atherosclerosis of unspecified type of vessel, native or graft   . Dizziness and giddiness   . Encounter for long-term (current) use of other medications   . Esophagitis, unspecified   . Hyperlipidemia   . Hypertrophy of prostate with urinary obstruction and other lower urinary tract symptoms (LUTS)   . Insomnia, unspecified   . Internal hemorrhoids without mention of complication   . Obesity, unspecified   . Old myocardial infarction   . Other and unspecified hyperlipidemia   . Other premature beats   . Palpitations   . Shortness of breath   . Slowing of urinary stream   . Special screening for malignant neoplasm of prostate   . Unspecified essential hypertension   . Unspecified  malignant neoplasm of scalp and skin of neck   . 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 Unspecified vitamin D deficiency   . Urinary frequency     Patient Active Problem List   Diagnosis Date Noted  . 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 Bilateral knee pain 01/30/2014  . Cerumen impaction 01/30/2014  . Edema 10/09/2013  . Reflux esophagitis 04/10/2013  . Carotid artery disease ([place]) 11/09/2012  . Hyperlipidemia   . Essential hypertension   . Coronary atherosclerosis   . Dizziness and giddiness   . Obesity   . Insomnia   . BPH (benign prostatic hypertrophy) with urinary obstruction   . Unspecified vitamin D deficiency     Past Surgical History:  Procedure Laterality Date  . CABG X 4  2000  . CARDIAC CATHETERIZATION    . CAROTID ENDARTERECTOMY Left 06-14-14   CE  . COLONOSCOPY  2007   Dr Lajoyce Corners, normal  . CORONARY ARTERY BYPASS GRAFT     2000  . ENDARTERECTOMY Left 06/14/2014   Procedure: LEFT CAROTID ENDARTERECTOMY WITH VASCUGUARD PATCH ANGIOPLASTY;  Surgeon: Serafina Mitchell, MD;  Location: [facility];  Service: Vascular;  Laterality: Left;  . ESOPHAGOGASTRODUODENOSCOPY ENDOSCOPY  2007   Dr Lajoyce Corners, with biopsy  . EXCISIONAL HEMORRHOIDECTOMY  1981  . LESION FROM FOREHEAD,(R) EAR (L) ARM  DR Almira Coaster  . LITHOTRIPSY  2005   Dr Almira Coaster  . TONSILLECTOMY  1940       Home Medications    Prior to Admission medications   Medication Sig Start Date End Date Taking? Authorizing Provider  aspirin 81 MG tablet Take 81 mg by mouth daily. Take one tablet once a day    Historical Provider, MD  b complex vitamins tablet Take 1 tablet by mouth daily.    Historical Provider, MD  cholecalciferol (VITAMIN D) 1000 UNITS tablet Take 1,000 Units by mouth daily.     Historical Provider, MD  Cinnamon 500 MG TABS Take 1 tablet by mouth daily.    Historical Provider, MD  CITRUS BERGAMOT PO Take 1 capsule by mouth daily. Take one capsule once daily to lower  cholesterol    Historical Provider, MD  Coenzyme Q10 (CO Q 10) 100 MG CAPS Take 1 capsule by mouth daily.    Historical Provider, MD  diazepam (VALIUM) 5 MG tablet take 1/2 tablet by mouth UP TO TWICE A DAY IF NEEDED FOR ANXIETY 04/28/16   Estill Dooms, MD  doxepin [facility]) 75 MG capsule One nightly to help nerves and for sleep 08/27/15   Estill Dooms, MD  fish oil-omega-3 fatty acids 1000 MG capsule Take 2 g by mouth daily. Take one tablet once a day for cholesterol    Historical Provider, MD  Fluticasone-Salmeterol (ADVAIR DISKUS) 250-50 MCG/DOSE AEPB inhale 1 puff by mouth twice a day 07/03/14   Estill Dooms, MD  ibuprofen (ADVIL,MOTRIN) 200 MG tablet Take 200 mg by mouth every 6 (six) hours as needed for pain. Take 3-4 tablets twice a day for neck pain as needed    Historical Provider, MD  methocarbamol (ROBAXIN) 500 MG tablet Take one tablet by mouth every 8 hours as needed for muscle relaxer 05/26/16   Lauree Chandler, NP  naproxen (NAPROSYN) 250 MG tablet Take 1 tablet (250 mg total) by mouth 2 (two) times daily with a meal. 05/25/16   Lauree Chandler, NP  Nutritional Supplements (NUTRITIONAL SUPPLEMENT PO) Synerflex. Take 2 tablets by mouth daily.    Historical Provider, MD  Nutritional Supplements (NUTRITIONAL SUPPLEMENT PO) Ginger and Turmeric. Take 1 capsule by mouth daily.    Historical Provider, MD  Potassium 99 MG TABS Take 1 tablet by mouth daily. Take one tablet once a day for cramps    Historical Provider, MD  ramipril (ALTACE) 10 MG capsule Take 1 capsule (10 mg total) by mouth daily. 09/19/15   Estill Dooms, MD  RESVERATROL 100 MG CAPS Take 1 capsule by mouth daily. Take one tablet once a day    Historical Provider, MD    Family History Family History  Problem Relation Age of Onset  . Cancer Mother     LIVER  . Cancer Brother     PROSTATE  . Cancer Daughter     Social History Social History  Substance Use Topics  . Smoking status: Former Smoker    Packs/day:  1.50    Years: 22.00    Types: Cigarettes    Quit date: 05/03/1973  . Smokeless tobacco: Never Used  . Alcohol use 3.6 oz/week    3 Glasses of wine, 3 Cans of beer per week     Allergies   Patient has no known allergies.   Review of Systems Review of Systems  Constitutional: Negative for chills and fever.  HENT: Positive for drooling and facial swelling. Negative for  dental problem, ear pain, rhinorrhea, sinus pain, sneezing, sore throat and trouble swallowing.        +jaw pain  Respiratory: Negative for shortness of breath.   Neurological: Positive for numbness.  All other systems reviewed and are negative.    Physical Exam Updated Vital Signs BP 169/83 (BP Location: Right Arm)   Pulse 88   Temp 98.9 F (37.2 C) (Oral)   Resp 18   Ht 5\' 11"  (1.803 m)   Wt 96.2 kg   SpO2 95%   BMI 29.57 kg/m   Physical Exam  Constitutional: He is oriented to person, place, and time. He appears well-developed and well-nourished. No distress.  HENT:  Head: Normocephalic and atraumatic.  Right Ear: Hearing and ear canal normal.  Left Ear: Hearing and ear canal normal.  Nose: Nasal deformity (Rhinophyma) present.  Mouth/Throat: No oral lesions. There is trismus in the jaw. No dental abscesses.  Significant amount of swelling and induration of the right side of face from midline of submental neck to the ramus of the mandible. Possible impacted posterior molar on the right side. Anterior cervical lymphadenopathy. No frank drooling - swallowing secretions.  TMs unable to be visualized bilaterally due to cerumen  Eyes: Conjunctivae are normal. Pupils are equal, round, and reactive to light. Right eye exhibits no discharge. Left eye exhibits no discharge. No scleral icterus.  Neck: Normal range of motion.  Cardiovascular: Normal rate.   Pulmonary/Chest: Effort normal. No respiratory distress.  Abdominal: He exhibits no distension.  Lymphadenopathy:    He has cervical adenopathy.    Neurological: He is alert and oriented to person, place, and time.  Skin: Skin is warm and dry.  Psychiatric: He has a normal mood and affect. His behavior is normal.  Nursing note and vitals reviewed.    ED Treatments / Results  Labs (all labs ordered are listed, but only abnormal results are displayed) Labs Reviewed  BASIC METABOLIC PANEL - Abnormal; Notable for the following:       Result Value   Sodium 131 (*)    Chloride 96 (*)    Glucose, Bld 105 (*)    Calcium 8.8 (*)    All other components within normal limits  CBC WITH DIFFERENTIAL/PLATELET - Abnormal; Notable for the following:    WBC 15.5 (*)    RBC 3.94 (*)    Hemoglobin 12.8 (*)    HCT 37.3 (*)    Neutro Abs 12.3 (*)    Monocytes Absolute 1.6 (*)    All other components within normal limits  I-STAT CG4 LACTIC ACID, ED    EKG  EKG Interpretation None       Radiology Ct Soft Tissue Neck W Contrast  Result Date: 05/30/2016 CLINICAL DATA:  83 y/o M; right-sided jaw pain and swelling for 6 days. EXAM: CT NECK WITH CONTRAST TECHNIQUE: Multidetector CT imaging of the neck was performed using the standard protocol following the bolus administration of intravenous contrast. CONTRAST:  63mL ISOVUE-300 IOPAMIDOL (ISOVUE-300) INJECTION 61% COMPARISON:  CT angiogram of the neck dated 05/21/2014. FINDINGS: Pharynx and larynx: Normal. No mass or swelling. Salivary glands: No inflammation, mass, or stone. Thyroid: Normal. Lymph nodes: Prominent right upper cervical lymph nodes are likely reactive. Vascular: The aortic atherosclerosis with moderate calcification. Stenosing atherosclerosis of left proximal internal carotid artery is better characterized on the prior CT angiogram. Limited intracranial: Negative. Visualized orbits: Negative. Mastoids and visualized paranasal sinuses: Complete left mastoid effusion and patchy opacification of left anterior ethmoid air  cells. Normal pneumatization of the left mastoid air cells.  Skeleton: Extensive cervical spondylosis with severe discogenic degenerative changes from the C3 through C7 levels and facet arthrosis. There are multiple levels of bony canal stenosis greatest at C4-5 where it is at least moderate. Artifact from dental hardware partially obscures the oropharynx. There appears to be lucencies within the right mandibular posterior most impacted molar and the bone which may represent dental infection. Upper chest: Moderate centrilobular emphysema of the lung apices. Other: Right posterior suboccipital subcutaneous fat nodule measuring 12 x 19 mm with a sinus tract to the skin surface (series 201, image 23) is stable from prior CT and likely represents a sebaceous cyst. There are abscesses within the right masticator compartment involving the masseter muscle and pterygoid muscles which are markedly swollen. The largest abscess within the masseter muscle measures 24 x 15 x 17 mm (AP x ML x CC), series 201, image 23. There is fat infiltration surrounding the masticator compartment in the submandibular compartment, dermis, focal fat, and right parapharyngeal fat as well as thickening of the right pterygoid muscle likely representing inflammatory changes. Mass effect from the swollen pterygoid muscles displaces the parapharyngeal fat medially and the airway leftward. IMPRESSION: 1. Abscesses within the masticator compartment involving masseter muscle and pterygoid muscles with inflammatory changes in the right facial subcutaneous fat, right submandibular space, and right parapharyngeal fat. Mass effect from the pterygoid muscles swelling displaces the airway leftward at level of the oropharynx. 2. The oral cavity is partially obscured by streak artifact from dental hardware. An impacted right posterior mandibular molar has several lucencies which may represent dental disease and source of infection. 3. Right upper cervical prominent lymph nodes are likely reactive. 4. Mild aortic  atherosclerosis. 5. Severe stenosis of left proximal internal carotid artery is better characterized on prior CT angiogram of the neck. 6. Paranasal sinus disease with stable complete opacification of the left maxillary sinus. 7. Moderate centrilobular emphysema of the lung apices. 8. Stable dermal nodule in the right suboccipital subcutaneous fat with sinus tract skin, probably a sebaceous cyst. Electronically Signed   By: Kristine Garbe M.D.   On: 05/30/2016 19:09    Procedures Procedures (including critical care time)  Medications Ordered in ED Medications  morphine 4 MG/ML injection 4 mg (4 mg Intravenous Given 05/30/16 1725)  iopamidol (ISOVUE-300) 61 % injection (75 mLs  Contrast Given 05/30/16 1806)  Ampicillin-Sulbactam (UNASYN) 3 g in sodium chloride 0.9 % 100 mL IVPB (0 g Intravenous Stopped 05/30/16 2148)  sodium chloride 0.9 % bolus 1,000 mL (0 mLs Intravenous Stopped 05/30/16 2109)  morphine 4 MG/ML injection 4 mg (4 mg Intravenous Given 05/30/16 1958)  morphine 4 MG/ML injection 4 mg (4 mg Intravenous Given 05/30/16 2158)     Initial Impression / Assessment and Plan / ED Course  I have reviewed the triage vital signs and the nursing notes.  Pertinent labs & imaging results that were available during my care of the patient were reviewed by me and considered in my medical decision making (see chart for details).  83 year old male with facial abscesses most likely due to odontogenic source. He is hypertensive but otherwise vitals are normal. No respiratory distress. Pain treated in ED. Also given fluids and Unasyn. CBC shows leukocytosis of 15.5 and mild anemia. BMP shows mild hyponatremia and hypochloremia. Lactic acid is normal. CT shows multiple abscesses with mass effect displacing the airway. Due to potential for patient to decompensate, will transfer to [facility] to  be seen by ENT there.   Shared visit with Dr. Rex Kras. Discussed with ENT at [facility] and will do ED to ED transfer.    Final Clinical Impressions(s) / ED Diagnoses   Final diagnoses:  Facial abscess    New Prescriptions New Prescriptions   No medications on file     Recardo Evangelist, PA-C 05/31/16 [place], MD 05/31/16 2157

## 2016-05-30 NOTE — ED Triage Notes (Signed)
Pt reports right sided jaw pain and swelling x1 week, saw pcp a few days ago who diagnosed pt with TMJ. Pt denies injury, reports pain in joint, not from teeth. Denies fevers.

## 2016-05-31 ENCOUNTER — Encounter: Payer: Self-pay | Admitting: Cardiology

## 2016-06-01 HISTORY — PX: MULTIPLE TOOTH EXTRACTIONS: SHX2053

## 2016-06-01 HISTORY — PX: OTHER SURGICAL HISTORY: SHX169

## 2016-06-06 HISTORY — PX: INCISION AND DRAINAGE: SHX5863

## 2016-06-14 ENCOUNTER — Other Ambulatory Visit: Payer: Self-pay | Admitting: Internal Medicine

## 2016-06-15 ENCOUNTER — Other Ambulatory Visit: Payer: Self-pay | Admitting: *Deleted

## 2016-06-15 DIAGNOSIS — L0211 Cutaneous abscess of neck: Secondary | ICD-10-CM | POA: Diagnosis not present

## 2016-06-15 MED ORDER — DIAZEPAM 5 MG PO TABS: ORAL_TABLET | ORAL | 5 refills | Status: DC

## 2016-06-15 NOTE — Telephone Encounter (Signed)
[facility] [place] 

## 2016-06-22 DIAGNOSIS — K047 Periapical abscess without sinus: Secondary | ICD-10-CM | POA: Diagnosis not present

## 2016-06-22 DIAGNOSIS — L0211 Cutaneous abscess of neck: Secondary | ICD-10-CM | POA: Diagnosis not present

## 2016-07-07 ENCOUNTER — Encounter (HOSPITAL_COMMUNITY): Payer: Self-pay

## 2016-07-07 ENCOUNTER — Ambulatory Visit: Payer: Self-pay | Admitting: Family

## 2016-07-07 DIAGNOSIS — Z9889 Other specified postprocedural states: Secondary | ICD-10-CM | POA: Diagnosis not present

## 2016-07-07 DIAGNOSIS — Z09 Encounter for follow-up examination after completed treatment for conditions other than malignant neoplasm: Secondary | ICD-10-CM | POA: Diagnosis not present

## 2016-07-07 DIAGNOSIS — Z8719 Personal history of other diseases of the digestive system: Secondary | ICD-10-CM | POA: Diagnosis not present

## 2016-07-19 ENCOUNTER — Other Ambulatory Visit: Payer: Self-pay | Admitting: Internal Medicine

## 2016-07-20 ENCOUNTER — Other Ambulatory Visit: Payer: Self-pay | Admitting: *Deleted

## 2016-07-20 MED ORDER — DIAZEPAM 5 MG PO TABS: ORAL_TABLET | ORAL | 0 refills | Status: DC

## 2016-07-20 NOTE — Telephone Encounter (Signed)
[facility] [place] 

## 2016-08-03 ENCOUNTER — Encounter: Payer: Self-pay | Admitting: Family

## 2016-08-06 ENCOUNTER — Other Ambulatory Visit: Payer: Self-pay | Admitting: Surgery

## 2016-08-06 DIAGNOSIS — I739 Peripheral vascular disease, unspecified: Principal | ICD-10-CM

## 2016-08-06 DIAGNOSIS — I779 Disorder of arteries and arterioles, unspecified: Secondary | ICD-10-CM

## 2016-08-11 ENCOUNTER — Ambulatory Visit (HOSPITAL_COMMUNITY)
Admission: RE | Admit: 2016-08-11 | Discharge: 2016-08-11 | Disposition: A | Discharge status: Home / Self Care | Payer: Medicare HMO | Source: Ambulatory Visit | Attending: Vascular Surgery | Admitting: Vascular Surgery

## 2016-08-11 ENCOUNTER — Ambulatory Visit (INDEPENDENT_AMBULATORY_CARE_PROVIDER_SITE_OTHER): Payer: Medicare HMO | Admitting: Family

## 2016-08-11 ENCOUNTER — Encounter: Payer: Self-pay | Admitting: Family

## 2016-08-11 VITALS — BP 154/80 | HR 78 | Temp 98.1°F | Resp 18 | Ht 69.0 in | Wt 204.0 lb

## 2016-08-11 DIAGNOSIS — I779 Disorder of arteries and arterioles, unspecified: Secondary | ICD-10-CM | POA: Diagnosis not present

## 2016-08-11 DIAGNOSIS — I6523 Occlusion and stenosis of bilateral carotid arteries: Secondary | ICD-10-CM | POA: Diagnosis not present

## 2016-08-11 DIAGNOSIS — I739 Peripheral vascular disease, unspecified: Secondary | ICD-10-CM

## 2016-08-11 DIAGNOSIS — Z9889 Other specified postprocedural states: Secondary | ICD-10-CM | POA: Diagnosis not present

## 2016-08-11 LAB — VAS US CAROTID
LCCADDIAS: -17 cm/s
LCCADSYS: -102 cm/s
LCCAPDIAS: 21 cm/s
LEFT ECA DIAS: -17 cm/s
LEFT VERTEBRAL DIAS: -12 cm/s
LICADDIAS: -26 cm/s
LICADSYS: -103 cm/s
LICAPDIAS: -12 cm/s
Left CCA prox sys: 137 cm/s
Left ICA prox sys: -97 cm/s
RIGHT CCA MID DIAS: 11 cm/s
RIGHT ECA DIAS: -13 cm/s
RIGHT VERTEBRAL DIAS: 7 cm/s
Right CCA prox dias: 5 cm/s
Right CCA prox sys: 94 cm/s
Right cca dist sys: -65 cm/s

## 2016-08-11 NOTE — Progress Notes (Signed)
Chief Complaint: Follow up Extracranial Carotid Artery Stenosis   History of Present Illness  Alex [name] is a 83 y.o. male patient of Dr. Trula Slade who is s/p left carotid endarterectomy with bovine pericardial patch angioplasty on 06/14/2014. Intraoperative findings included 85% stenosis. The lesion was approximately 2.5 cm above the carotid bifurcation. The patient had a very stiff neck making exposure difficult therefore a shunt was not placed.  Postoperatively the patient had issues with urinary retention and he was discharged to home with a catheter. He remains neurologically intact other than a marginal mandibular neurapraxia.  The patient denies any history of TIA or stroke symptoms, specifically the patient denies a history of amaurosis fugax or monocular blindness, denies a history unilateral of facial drooping, denies a history of hemiplegia, and denies a history of receptive or expressive aphasia.  He denies claudication symptoms with walking, but does report moderate issues with his knees.   He had a severe dental abscess that required 12 days hospitalization at [facility], then [facility] in [place]. He required an I&D of right mandible area. Since this he is unable to open his mouth as wide as he was prior to this, and has some numbness on the right side of his face.   Pt Diabetic: no Pt smoker: former smoker, quit in the 1980's  Pt meds include: Statin : not taking as prescribed; I advised pt to let his Dr. Gwenlyn Found and Nyoka Cowden know this ASA: yes Other anticoagulants/antiplatelets: no   Past Medical History:  Diagnosis Date  . Acquired deformity of nose   . Calculus of kidney   . Carotid artery disease ([place])   . Cellulitis and abscess of unspecified site   . Cervicalgia   . CHF (congestive heart failure) ([place])   . Coronary atherosclerosis of unspecified type of vessel, native or graft   . Dizziness and giddiness   . Encounter for long-term (current) use  of other medications   . Esophagitis, unspecified   . Hyperlipidemia   . Hypertrophy of prostate with urinary obstruction and other lower urinary tract symptoms (LUTS)   . Insomnia, unspecified   . Internal hemorrhoids without mention of complication   . Obesity, unspecified   . Old myocardial infarction   . Other and unspecified hyperlipidemia   . Other premature beats   . Palpitations   . Shortness of breath   . Slowing of urinary stream   . Special screening for malignant neoplasm of prostate   . Unspecified essential hypertension   . 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 Unspecified vitamin D deficiency   . Urinary frequency     Social History Social History  Substance Use Topics  . Smoking status: Former Smoker    Packs/day: 1.50    Years: 22.00    Types: Cigarettes    Quit date: 05/03/1973  . Smokeless tobacco: Never Used  . Alcohol use 3.6 oz/week    3 Glasses of wine, 3 Cans of beer per week    Family History Family History  Problem Relation Age of Onset  . Cancer Mother     LIVER  . Cancer Brother     PROSTATE  . Cancer Daughter     Surgical History Past Surgical History:  Procedure Laterality Date  . CABG X 4  2000  . CARDIAC CATHETERIZATION    . CAROTID ENDARTERECTOMY Left 06-14-14  CE  . COLONOSCOPY  2007   Dr Lajoyce Corners, normal  . CORONARY ARTERY BYPASS GRAFT     2000  . ENDARTERECTOMY Left 06/14/2014   Procedure: LEFT CAROTID ENDARTERECTOMY WITH VASCUGUARD PATCH ANGIOPLASTY;  Surgeon: Serafina Mitchell, MD;  Location: [facility];  Service: Vascular;  Laterality: Left;  . ESOPHAGOGASTRODUODENOSCOPY ENDOSCOPY  2007   Dr Lajoyce Corners, with biopsy  . EXCISIONAL HEMORRHOIDECTOMY  1981  . LESION FROM FOREHEAD,(R) EAR (L) ARM     DR Almira Coaster  . LITHOTRIPSY  2005   Dr Almira Coaster  . TONSILLECTOMY  1940    No Known Allergies  Current Outpatient Prescriptions   Medication Sig Dispense Refill  . aspirin 81 MG tablet Take 81 mg by mouth daily. Take one tablet once a day    . b complex vitamins tablet Take 1 tablet by mouth daily.    . cholecalciferol (VITAMIN D) 1000 UNITS tablet Take 1,000 Units by mouth daily.     . Cinnamon 500 MG TABS Take 1 tablet by mouth daily.    Marland Kitchen CITRUS BERGAMOT PO Take 1 capsule by mouth daily. Take one capsule once daily to lower cholesterol    . Coenzyme Q10 (CO Q 10) 100 MG CAPS Take 1 capsule by mouth daily.    . diazepam (VALIUM) 5 MG tablet Take 1/2 tablet up to twice daily as needed for anxiety 30 tablet 0  . doxepin (SINEQUAN) 75 MG capsule One nightly to help nerves and for sleep 90 capsule 3  . fish oil-omega-3 fatty acids 1000 MG capsule Take 2 g by mouth daily. Take one tablet once a day for cholesterol    . Fluticasone-Salmeterol (ADVAIR DISKUS) 250-50 MCG/DOSE AEPB inhale 1 puff by mouth twice a day 60 each 5  . ibuprofen (ADVIL,MOTRIN) 200 MG tablet Take 200 mg by mouth every 6 (six) hours as needed for pain. Take 3-4 tablets twice a day for neck pain as needed    . methocarbamol (ROBAXIN) 500 MG tablet Take one tablet by mouth every 8 hours as needed for muscle relaxer 21 tablet 0  . Nutritional Supplements (NUTRITIONAL SUPPLEMENT PO) Synerflex. Take 2 tablets by mouth daily.    . Nutritional Supplements (NUTRITIONAL SUPPLEMENT PO) Ginger and Turmeric. Take 1 capsule by mouth daily.    . Potassium 99 MG TABS Take 1 tablet by mouth daily. Take one tablet once a day for cramps    . ramipril (ALTACE) 10 MG capsule Take 1 capsule (10 mg total) by mouth daily. 90 capsule 3  . RESVERATROL 100 MG CAPS Take 1 capsule by mouth daily. Take one tablet once a day    . naproxen (NAPROSYN) 250 MG tablet Take 1 tablet (250 mg total) by mouth 2 (two) times daily with a meal. (Patient not taking: Reported on 08/11/2016) 14 tablet 0   No current facility-administered medications for this visit.     Review of Systems : See HPI  for pertinent positives and negatives.  Physical Examination  Vitals:   08/11/16 1347 08/11/16 1349 08/11/16 1351  BP: (!) 154/80 (!) 162/92 (!) 154/80  Pulse: 82 78 78  Resp: 18 18   Temp: 98.1 F (36.7 C) 98.1 F (36.7 C)   SpO2: 98% 98%   Weight: 204 lb (92.5 kg) 204 lb (92.5 kg)   Height: 5\' 9"  (1.753 m) 5\' 9"  (1.753 m)    Body mass index is 30.13 kg/m.  General: WDWN obese male in NAD GAIT: normal Eyes: PERRLA Pulmonary: Respirations are  non-labored, CTAB  Cardiac: regular rhythm, no detected murmur.  VASCULAR EXAM Carotid Bruits Right Left   Negative Negative   Aorta is not palpable. Radial pulses are 2+ palpable and equal.      LE Pulses Right Left   POPLITEAL not palpable  not palpable   POSTERIOR TIBIAL  palpable   palpable    DORSALIS PEDIS  ANTERIOR TIBIAL palpable  palpable     Gastrointestinal: soft, nontender, BS WNL, no r/g, no palpable masses.  Musculoskeletal: no muscle atrophy/wasting. M/S 5/5 throughout, extremities without ischemic changes.  Neurologic: A&O X 3; Appropriate Affect, Speech is normal CN 2-12 intact, pain and light touch intact in extremities, Motor exam as listed above.     Assessment: Alex [name] is a 83 y.o. male who is s/p left carotid endarterectomy with bovine pericardial patch angioplasty on 06/14/2014. He has no history of stroke or TIA.  His atherosclerotic risk factors include CAD, obesity, and not taking his prescribed statin. I advised pt to let Dr. Nyoka Cowden and Dr. Gwenlyn Found know this.   DATA Today's carotid duplex suggests <40% right ICA stenosis and widely patent left ICA (CEA site) with no evidence of restenosis. Bilateral vertebral artery flow is antegrade.  Bilateral subclavian artery waveforms are normal.  No significant change  compared to prior exam on 07-07-15.    Plan:  Follow-up in 1 year with Carotid Duplex scan.  I discussed in depth with the patient the nature of atherosclerosis, and emphasized the importance of maximal medical management including strict control of blood pressure, blood glucose, and lipid levels, obtaining regular exercise, and continued cessation of smoking.  The patient is aware that without maximal medical management the underlying atherosclerotic disease process will progress, limiting the benefit of any interventions. The patient was given information about stroke prevention and what symptoms should prompt the patient to seek immediate medical care. Thank you for allowing [place] to participate in this patient's care.  Clemon Chambers, RN, MSN, FNP-C [facility] Office: [phone]  Clinic Physician: Scot Dock  08/11/16 1:59 PM

## 2016-08-11 NOTE — Patient Instructions (Signed)
Stroke Prevention Some medical conditions and behaviors are associated with an increased chance of having a stroke. You may prevent a stroke by making healthy choices and managing medical conditions. How can I reduce my risk of having a stroke?  Stay physically active. Get at least 30 minutes of activity on most or all days.  Do not smoke. It may also be helpful to avoid exposure to secondhand smoke.  Limit alcohol use. Moderate alcohol use is considered to be:  No more than 2 drinks per day for men.  No more than 1 drink per day for nonpregnant women.  Eat healthy foods. This involves:  Eating 5 or more servings of fruits and vegetables a day.  Making dietary changes that address high blood pressure (hypertension), high cholesterol, diabetes, or obesity.  Manage your cholesterol levels.  Making food choices that are high in fiber and low in saturated fat, trans fat, and cholesterol may control cholesterol levels.  Take any prescribed medicines to control cholesterol as directed by your health care provider.  Manage your diabetes.  Controlling your carbohydrate and sugar intake is recommended to manage diabetes.  Take any prescribed medicines to control diabetes as directed by your health care provider.  Control your hypertension.  Making food choices that are low in salt (sodium), saturated fat, trans fat, and cholesterol is recommended to manage hypertension.  Ask your health care provider if you need treatment to lower your blood pressure. Take any prescribed medicines to control hypertension as directed by your health care provider.  If you are 18-39 years of age, have your blood pressure checked every 3-5 years. If you are 40 years of age or older, have your blood pressure checked every year.  Maintain a healthy weight.  Reducing calorie intake and making food choices that are low in sodium, saturated fat, trans fat, and cholesterol are recommended to manage  weight.  Stop drug abuse.  Avoid taking birth control pills.  Talk to your health care provider about the risks of taking birth control pills if you are over 35 years old, smoke, get migraines, or have ever had a blood clot.  Get evaluated for sleep disorders (sleep apnea).  Talk to your health care provider about getting a sleep evaluation if you snore a lot or have excessive sleepiness.  Take medicines only as directed by your health care provider.  For some people, aspirin or blood thinners (anticoagulants) are helpful in reducing the risk of forming abnormal blood clots that can lead to stroke. If you have the irregular heart rhythm of atrial fibrillation, you should be on a blood thinner unless there is a good reason you cannot take them.  Understand all your medicine instructions.  Make sure that other conditions (such as anemia or atherosclerosis) are addressed. Get help right away if:  You have sudden weakness or numbness of the face, arm, or leg, especially on one side of the body.  Your face or eyelid droops to one side.  You have sudden confusion.  You have trouble speaking (aphasia) or understanding.  You have sudden trouble seeing in one or both eyes.  You have sudden trouble walking.  You have dizziness.  You have a loss of balance or coordination.  You have a sudden, severe headache with no known cause.  You have new chest pain or an irregular heartbeat. Any of these symptoms may represent a serious problem that is an emergency. Do not wait to see if the symptoms will go away.   Get medical help at once. Call your local emergency services (911 in [place] Do not drive yourself to the hospital. This information is not intended to replace advice given to you by your health care provider. Make sure you discuss any questions you have with your health care provider. Document Released: 05/27/2004 Document Revised: 09/25/2015 Document Reviewed: 10/20/2012 Elsevier  Interactive Patient Education  2017 [employer]      Preventing Cerebrovascular Disease Arteries are blood vessels that carry blood that contains oxygen from the heart to all parts of the body. Cerebrovascular disease affects arteries that supply the brain. Any condition that blocks or disrupts blood flow to the brain can cause cerebrovascular disease. Brain cells that lose blood supply start to die within minutes (stroke). Stroke is the main danger of cerebrovascular disease. Atherosclerosis and high blood pressure are common causes of cerebrovascular disease. Atherosclerosis is narrowing and hardening of an artery that results when fat, cholesterol, calcium, or other substances (plaque) build up inside an artery. Plaque reduces blood flow through the artery. High blood pressure increases the risk of bleeding inside the brain. Making diet and lifestyle changes to prevent atherosclerosis and high blood pressure lowers your risk of cerebrovascular disease. What nutrition changes can be made?  Eat more fruits, vegetables, and whole grains.  Reduce how much saturated fat you eat. To do this, eat less red meat and fewer full-fat dairy products.  Eat healthy proteins instead of red meat. Healthy proteins include:  Fish. Eat fish that contains heart-healthy omega-3 fatty acids, twice a week. Examples include salmon, albacore tuna, mackerel, and herring.  Chicken.  Nuts.  Low-fat or nonfat yogurt.  Avoid processed meats, like bacon and lunchmeat.  Avoid foods that contain:  A lot of sugar, such as sweets and drinks with added sugar.  A lot of salt (sodium). Avoid adding extra salt to your food, as told by your health care provider.  Trans fats, such as margarine and baked goods. Trans fats may be listed as "partially hydrogenated oils" on food labels.  Check food labels to see how much sodium, sugar, and trans fats are in foods.  Use vegetable oils that contain low amounts of  saturated fat, such as olive oil or canola oil. What lifestyle changes can be made?  Drink alcohol in moderation. This means no more than 1 drink a day for nonpregnant women and 2 drinks a day for men. One drink equals 12 oz of beer, 5 oz of wine, or 1 oz of hard liquor.  If you are overweight, ask your health care provider to recommend a weight-loss plan for you. Losing 5-10 lb (2.2-4.5 kg) can reduce your risk of diabetes, atherosclerosis, and high blood pressure.  Exercise for 30?60 minutes on most days, or as much as told by your health care provider.  Do moderate-intensity exercise, such as brisk walking, bicycling, and water aerobics. Ask your health care provider which activities are safe for you.  Do not use any products that contain nicotine or tobacco, such as cigarettes and e-cigarettes. If you need help quitting, ask your health care provider. Why are these changes important? Making these changes lowers your risk of many diseases that can cause cerebrovascular disease and stroke. Stroke is a leading cause of death and disability. Making these changes also improves your overall health and quality of life. What can I do to lower my risk? The following factors make you more likely to develop cerebrovascular disease:  Being overweight.  Smoking.  Being physically   inactive.  Eating a high-fat diet.  Having certain health conditions, such as:  Diabetes.  High blood pressure.  Heart disease.  Atherosclerosis.  High cholesterol.  Sickle cell disease. Talk with your health care provider about your risk for cerebrovascular disease. Work with your health care provider to control diseases that you have that may contribute to cerebrovascular disease. Your health care provider may prescribe medicines to help prevent major causes of cerebrovascular disease. Where to find more information: Learn more about preventing cerebrovascular disease from:  National Heart, Lung, and  Blood Institute: www.nhlbi.nih.gov/health/health-topics/topics/stroke  Centers for Disease Control and Prevention: cdc.gov/stroke/about.htm Summary  Cerebrovascular disease can lead to a stroke.  Atherosclerosis and high blood pressure are major causes of cerebrovascular disease.  Making diet and lifestyle changes can reduce your risk of cerebrovascular disease.  Work with your health care provider to get your risk factors under control to reduce your risk of cerebrovascular disease. This information is not intended to replace advice given to you by your health care provider. Make sure you discuss any questions you have with your health care provider. Document Released: 05/04/2015 Document Revised: 11/07/2015 Document Reviewed: 05/04/2015 Elsevier Interactive Patient Education  2017 Elsevier Inc.  

## 2016-08-11 NOTE — Progress Notes (Signed)
Vitals:   08/11/16 1347 08/11/16 1349  BP: (!) 154/80 (!) 162/92  Pulse: 82 78  Resp: 18 18  Temp: 98.1 F (36.7 C) 98.1 F (36.7 C)  SpO2: 98% 98%  Weight: 204 lb (92.5 kg) 204 lb (92.5 kg)  Height: 5\' 9"  (1.753 m) 5\' 9"  (1.753 m)

## 2016-08-12 NOTE — Addendum Note (Signed)
Addended by: Lianne Cure A on: 08/12/2016 09:54 AM   Modules accepted: Orders

## 2016-08-30 ENCOUNTER — Ambulatory Visit (INDEPENDENT_AMBULATORY_CARE_PROVIDER_SITE_OTHER): Payer: Medicare HMO

## 2016-08-30 ENCOUNTER — Other Ambulatory Visit: Payer: Medicare HMO

## 2016-08-30 VITALS — BP 152/72 | HR 75 | Temp 98.7°F | Ht 69.0 in | Wt 203.0 lb

## 2016-08-30 DIAGNOSIS — E785 Hyperlipidemia, unspecified: Secondary | ICD-10-CM

## 2016-08-30 DIAGNOSIS — I10 Essential (primary) hypertension: Secondary | ICD-10-CM

## 2016-08-30 DIAGNOSIS — Z0000 Encounter for general adult medical examination without abnormal findings: Secondary | ICD-10-CM | POA: Diagnosis not present

## 2016-08-30 LAB — COMPREHENSIVE METABOLIC PANEL
ALBUMIN: 3.9 g/dL (ref 3.6–5.1)
ALT: 10 U/L (ref 9–46)
AST: 14 U/L (ref 10–35)
Alkaline Phosphatase: 47 U/L (ref 40–115)
BUN: 12 mg/dL (ref 7–25)
CHLORIDE: 104 mmol/L (ref 98–110)
CO2: 27 mmol/L (ref 20–31)
CREATININE: 1.04 mg/dL (ref 0.70–1.11)
Calcium: 9.1 mg/dL (ref 8.6–10.3)
Glucose, Bld: 88 mg/dL (ref 65–99)
POTASSIUM: 4.4 mmol/L (ref 3.5–5.3)
SODIUM: 140 mmol/L (ref 135–146)
Total Bilirubin: 0.4 mg/dL (ref 0.2–1.2)
Total Protein: 7.1 g/dL (ref 6.1–8.1)

## 2016-08-30 LAB — LIPID PANEL
CHOL/HDL RATIO: 4.9 ratio (ref <5.0)
Cholesterol: 193 mg/dL (ref <200)
HDL: 39 mg/dL — AB (ref >40)
LDL Cholesterol: 133 mg/dL — ABNORMAL HIGH (ref <100)
TRIGLYCERIDES: 106 mg/dL (ref <150)
VLDL: 21 mg/dL (ref <30)

## 2016-08-30 NOTE — Progress Notes (Signed)
   I reviewed health advisor's note, was available for consultation and agree with the assessment and plan as written.  Will send note to Dr. Nyoka Cowden for f/u on bp, Rx refills and tdap.  Annaka Cleaver L. Madysin Crisp, D.O. [facility] [address], [place] [address] Cell Phone (Mon-Fri 8am-5pm):  [phone] On Call:  [phone] & follow prompts after 5pm & weekends Office Phone:  [phone] Office Fax:  [phone]   Quick Notes   Health Maintenance: TDAP due, needs prescription.  Abnormal Screen: MMSE 26/30. Did not pass clock drawing. PHQ-2:1. 1st BP was 152/72. 2nd BP: 160/72  Patient Concerns: Wants to discuss BP meds with you.  And was hoping to get prescription refills increased.  Nurse Concerns: None

## 2016-08-30 NOTE — Patient Instructions (Addendum)
[name] , Thank you for taking time to come for your Medicare Wellness Visit. I appreciate your ongoing commitment to your health goals. Please review the following plan we discussed and let me know if I can assist you in the future.   Screening recommendations/referrals: Colonoscopy up to date Recommended yearly ophthalmology/optometry visit for glaucoma screening and checkup Recommended yearly dental visit for hygiene and checkup  Vaccinations: Influenza vaccine up to date Pneumococcal vaccine up to date Tdap vaccine up due. Shingles vaccine up to date. If you want the new shingles vaccine, Shingrix, let us know and we will put in a prescription.   Advanced directives: Advance directive discussed with you today. Please bring in a copy of your living will and HCPOA.   Conditions/risks identified: None  Next appointment: Dr Nyoka Cowden 5/2 @1 :30pm  Preventive Care 65 Years and Older, Male Preventive care refers to lifestyle choices and visits with your health care provider that can promote health and wellness. What does preventive care include?  A yearly physical exam. This is also called an annual well check.  Dental exams once or twice a year.  Routine eye exams. Ask your health care provider how often you should have your eyes checked.  Personal lifestyle choices, including:  Daily care of your teeth and gums.  Regular physical activity.  Eating a healthy diet.  Avoiding tobacco and drug use.  Limiting alcohol use.  Practicing safe sex.  Taking low doses of aspirin every day.  Taking vitamin and mineral supplements as recommended by your health care provider. What happens during an annual well check? The services and screenings done by your health care provider during your annual well check will depend on your age, overall health, lifestyle risk factors, and family history of disease. Counseling  Your health care provider may ask you questions about your:  Alcohol  use.  Tobacco use.  Drug use.  Emotional well-being.  Home and relationship well-being.  Sexual activity.  Eating habits.  History of falls.  Memory and ability to understand (cognition).  Work and work Statistician. Screening  You may have the following tests or measurements:  Height, weight, and BMI.  Blood pressure.  Lipid and cholesterol levels. These may be checked every 5 years, or more frequently if you are over 35 years old.  Skin check.  Lung cancer screening. You may have this screening every year starting at age 24 if you have a 30-pack-year history of smoking and currently smoke or have quit within the past 15 years.  Fecal occult blood test (FOBT) of the stool. You may have this test every year starting at age 74.  Flexible sigmoidoscopy or colonoscopy. You may have a sigmoidoscopy every 5 years or a colonoscopy every 10 years starting at age 53.  Prostate cancer screening. Recommendations will vary depending on your family history and other risks.  Hepatitis C blood test.  Hepatitis B blood test.  Sexually transmitted disease (STD) testing.  Diabetes screening. This is done by checking your blood sugar (glucose) after you have not eaten for a while (fasting). You may have this done every 1-3 years.  Abdominal aortic aneurysm (AAA) screening. You may need this if you are a current or former smoker.  Osteoporosis. You may be screened starting at age 39 if you are at high risk. Talk with your health care provider about your test results, treatment options, and if necessary, the need for more tests. Vaccines  Your health care provider may recommend certain vaccines,  such as:  Influenza vaccine. This is recommended every year.  Tetanus, diphtheria, and acellular pertussis (Tdap, Td) vaccine. You may need a Td booster every 10 years.  Zoster vaccine. You may need this after age 66.  Pneumococcal 13-valent conjugate (PCV13) vaccine. One dose is  recommended after age 28.  Pneumococcal polysaccharide (PPSV23) vaccine. One dose is recommended after age 72. Talk to your health care provider about which screenings and vaccines you need and how often you need them. This information is not intended to replace advice given to you by your health care provider. Make sure you discuss any questions you have with your health care provider. Document Released: 05/16/2015 Document Revised: 01/07/2016 Document Reviewed: 02/18/2015 Elsevier Interactive Patient Education  2017 [employer] Prevention in the Home Falls can cause injuries. They can happen to people of all ages. There are many things you can do to make your home safe and to help prevent falls. What can I do on the outside of my home?  Regularly fix the edges of walkways and driveways and fix any cracks.  Remove anything that might make you trip as you walk through a door, such as a raised step or threshold.  Trim any bushes or trees on the path to your home.  Use bright outdoor lighting.  Clear any walking paths of anything that might make someone trip, such as rocks or tools.  Regularly check to see if handrails are loose or broken. Make sure that both sides of any steps have handrails.  Any raised decks and porches should have guardrails on the edges.  Have any leaves, snow, or ice cleared regularly.  Use sand or salt on walking paths during winter.  Clean up any spills in your garage right away. This includes oil or grease spills. What can I do in the bathroom?  Use night lights.  Install grab bars by the toilet and in the tub and shower. Do not use towel bars as grab bars.  Use non-skid mats or decals in the tub or shower.  If you need to sit down in the shower, use a plastic, non-slip stool.  Keep the floor dry. Clean up any water that spills on the floor as soon as it happens.  Remove soap buildup in the tub or shower regularly.  Attach bath mats  securely with double-sided non-slip rug tape.  Do not have throw rugs and other things on the floor that can make you trip. What can I do in the bedroom?  Use night lights.  Make sure that you have a light by your bed that is easy to reach.  Do not use any sheets or blankets that are too big for your bed. They should not hang down onto the floor.  Have a firm chair that has side arms. You can use this for support while you get dressed.  Do not have throw rugs and other things on the floor that can make you trip. What can I do in the kitchen?  Clean up any spills right away.  Avoid walking on wet floors.  Keep items that you use a lot in easy-to-reach places.  If you need to reach something above you, use a strong step stool that has a grab bar.  Keep electrical cords out of the way.  Do not use floor polish or wax that makes floors slippery. If you must use wax, use non-skid floor wax.  Do not have throw rugs and other things on  the floor that can make you trip. What can I do with my stairs?  Do not leave any items on the stairs.  Make sure that there are handrails on both sides of the stairs and use them. Fix handrails that are broken or loose. Make sure that handrails are as long as the stairways.  Check any carpeting to make sure that it is firmly attached to the stairs. Fix any carpet that is loose or worn.  Avoid having throw rugs at the top or bottom of the stairs. If you do have throw rugs, attach them to the floor with carpet tape.  Make sure that you have a light switch at the top of the stairs and the bottom of the stairs. If you do not have them, ask someone to add them for you. What else can I do to help prevent falls?  Wear shoes that:  Do not have high heels.  Have rubber bottoms.  Are comfortable and fit you well.  Are closed at the toe. Do not wear sandals.  If you use a stepladder:  Make sure that it is fully opened. Do not climb a closed  stepladder.  Make sure that both sides of the stepladder are locked into place.  Ask someone to hold it for you, if possible.  Clearly mark and make sure that you can see:  Any grab bars or handrails.  First and last steps.  Where the edge of each step is.  Use tools that help you move around (mobility aids) if they are needed. These include:  Canes.  Walkers.  Scooters.  Crutches.  Turn on the lights when you go into a dark area. Replace any light bulbs as soon as they burn out.  Set up your furniture so you have a clear path. Avoid moving your furniture around.  If any of your floors are uneven, fix them.  If there are any pets around you, be aware of where they are.  Review your medicines with your doctor. Some medicines can make you feel dizzy. This can increase your chance of falling. Ask your doctor what other things that you can do to help prevent falls. This information is not intended to replace advice given to you by your health care provider. Make sure you discuss any questions you have with your health care provider. Document Released: 02/13/2009 Document Revised: 09/25/2015 Document Reviewed: 05/24/2014 Elsevier Interactive Patient Education  2017 [employer].

## 2016-08-30 NOTE — Progress Notes (Signed)
Subjective:   Alex [name] is a 83 y.o. male who presents for Medicare Annual/Subsequent preventive examination.     Objective:    Vitals: BP (!) 152/72 (BP Location: Left Arm, Patient Position: Sitting)   Pulse 75   Temp 98.7 F (37.1 C) (Oral)   Ht 5\' 9"  (1.753 m)   Wt 203 lb (92.1 kg)   SpO2 97%   BMI 29.98 kg/m   Body mass index is 29.98 kg/m.  Tobacco History  Smoking Status  . Former Smoker  . Packs/day: 2.00  . Years: 25.00  . Types: Cigarettes  . Quit date: 05/03/1973  Smokeless Tobacco  . Never Used     Counseling given: Not Answered   Past Medical History:  Diagnosis Date  . Acquired deformity of nose   . Calculus of kidney   . Carotid artery disease ([place])   . Cellulitis and abscess of unspecified site   . Cervicalgia   . CHF (congestive heart failure) ([place])   . Coronary atherosclerosis of unspecified type of vessel, native or graft   . Dizziness and giddiness   . Encounter for long-term (current) use of other medications   . Esophagitis, unspecified   . Hyperlipidemia   . Hypertrophy of prostate with urinary obstruction and other lower urinary tract symptoms (LUTS)   . Insomnia, unspecified   . Internal hemorrhoids without mention of complication   . Obesity, unspecified   . Old myocardial infarction   . Other and unspecified hyperlipidemia   . Other premature beats   . Palpitations   . Shortness of breath   . Slowing of urinary stream   . Special screening for malignant neoplasm of prostate   . Unspecified essential hypertension   . 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 Unspecified vitamin D deficiency   . Urinary frequency    Past Surgical History:  Procedure Laterality Date  . CABG X 4  2000  . CARDIAC CATHETERIZATION    . CAROTID ENDARTERECTOMY Left 06-14-14   CE  . COLONOSCOPY  2007   Dr Lajoyce Corners, normal  . CORONARY ARTERY BYPASS GRAFT       2000  . ENDARTERECTOMY Left 06/14/2014   Procedure: LEFT CAROTID ENDARTERECTOMY WITH VASCUGUARD PATCH ANGIOPLASTY;  Surgeon: Serafina Mitchell, MD;  Location: [facility];  Service: Vascular;  Laterality: Left;  . ESOPHAGOGASTRODUODENOSCOPY ENDOSCOPY  2007   Dr Lajoyce Corners, with biopsy  . EXCISIONAL HEMORRHOIDECTOMY  1981  . LESION FROM FOREHEAD,(R) EAR (L) ARM     DR Almira Coaster  . LITHOTRIPSY  2005   Dr Almira Coaster  . TONSILLECTOMY  1940  . trench in right side of jaw after abcesses  06/01/2016   Family History  Problem Relation Age of Onset  . Cancer Mother     LIVER  . Cancer Brother     PROSTATE  . Cancer Daughter    History  Sexual Activity  . Sexual activity: No    Outpatient Encounter Prescriptions as of 08/30/2016  Medication Sig  . b complex vitamins tablet Take 1 tablet by mouth daily.  . Cinnamon 500 MG TABS Take 1 tablet by mouth daily.  Marland Kitchen CITRUS BERGAMOT PO Take 1 capsule by mouth daily. Take one capsule once daily to lower cholesterol  . Coenzyme Q10 (CO Q 10) 100 MG CAPS Take 1 capsule by mouth daily.  . diazepam (VALIUM) 5 MG  tablet Take 1/2 tablet up to twice daily as needed for anxiety  . doxepin (SINEQUAN) 75 MG capsule One nightly to help nerves and for sleep  . fish oil-omega-3 fatty acids 1000 MG capsule Take 2 g by mouth daily. Take one tablet once a day for cholesterol  . Fluticasone-Salmeterol (ADVAIR DISKUS) 250-50 MCG/DOSE AEPB inhale 1 puff by mouth twice a day  . ibuprofen (ADVIL,MOTRIN) 200 MG tablet Take 200 mg by mouth every 6 (six) hours as needed for pain. Take 3-4 tablets twice a day for neck pain as needed  . Nutritional Supplements (NUTRITIONAL SUPPLEMENT PO) Synerflex. Take 2 tablets by mouth daily.  . Nutritional Supplements (NUTRITIONAL SUPPLEMENT PO) Ginger and Turmeric. Take 1 capsule by mouth daily.  . Potassium 99 MG TABS Take 1 tablet by mouth daily. Take one tablet once a day for cramps  . ramipril (ALTACE) 10 MG capsule Take 1 capsule (10 mg  total) by mouth daily.  Marland Kitchen RESVERATROL 100 MG CAPS Take 1 capsule by mouth daily. Take one tablet once a day  . aspirin 81 MG tablet Take 81 mg by mouth daily. Take one tablet once a day  . cholecalciferol (VITAMIN D) 1000 UNITS tablet Take 1,000 Units by mouth daily.   . naproxen (NAPROSYN) 250 MG tablet Take 1 tablet (250 mg total) by mouth 2 (two) times daily with a meal. (Patient not taking: Reported on 08/11/2016)  . [DISCONTINUED] methocarbamol (ROBAXIN) 500 MG tablet Take one tablet by mouth every 8 hours as needed for muscle relaxer   No facility-administered encounter medications on file as of 08/30/2016.     Activities of Daily Living In your present state of health, do you have any difficulty performing the following activities: 08/30/2016  Hearing? Y  Vision? N  Difficulty concentrating or making decisions? Y  Walking or climbing stairs? Y  Dressing or bathing? N  Doing errands, shopping? N  Preparing Food and eating ? N  Using the Toilet? N  In the past six months, have you accidently leaked urine? Y  Do you have problems with loss of bowel control? Y  Managing your Medications? N  Managing your Finances? N  Housekeeping or managing your Housekeeping? N  Some recent data might be hidden    Patient Care Team: Estill Dooms, MD as PCP - General (Internal Medicine) Lorretta Harp, MD as Consulting Physician (Cardiology) Serafina Mitchell, MD as Consulting Physician (Vascular Surgery) Shon Hough, MD as Consulting Physician (Ophthalmology) Bjorn Loser, MD as Consulting Physician (Urology) Shon Hough, MD as Consulting Physician (Ophthalmology)   Assessment:    Exercise Activities and Dietary recommendations Current Exercise Habits: The patient does not participate in regular exercise at present;Home exercise routine, Type of exercise: walking, Time (Minutes): 15, Frequency (Times/Week): 7, Weekly Exercise (Minutes/Week): 105, Intensity: Mild, Exercise  limited by: None identified  Goals    . Increase water intake          Starting 08/30/2016 I will start drinking 3 glasses of water a day.      Fall Risk Fall Risk  08/30/2016 05/25/2016 08/27/2015 02/25/2015 10/29/2014  Falls in the past year? No No No No No   Depression Screen PHQ 2/9 Scores 08/30/2016 08/27/2015 10/29/2014 06/20/2014  PHQ - 2 Score 1 0 0 0    Cognitive Function MMSE - Mini Mental State Exam 08/30/2016 08/27/2015  Orientation to time 5 5  Orientation to Place 5 5  Registration 3 3  Attention/ Calculation 5 5  Recall 0 3  Language- name 2 objects 2 2  Language- repeat 1 1  Language- follow 3 step command 3 3  Language- read & follow direction 1 1  Write a sentence 1 1  Copy design 0 1  Total score 26 30        Immunization History  Administered Date(s) Administered  . Influenza Whole 02/06/2013  . Influenza, High Dose Seasonal PF 12/09/2015  . Influenza-Unspecified 01/31/2009, 01/08/2014, 12/13/2014  . Pneumococcal Conjugate-13 11/08/2013  . Pneumococcal-Unspecified 05/03/2004  . Td 05/03/2004  . Zoster 07/19/2012   Screening Tests Health Maintenance  Topic Date Due  . TETANUS/TDAP  05/03/2014  . INFLUENZA VACCINE  12/01/2016  . PNA vac Low Risk Adult  Completed      Plan:    I have personally reviewed and addressed the Medicare Annual Wellness questionnaire and have noted the following in the patient's chart:  A. Medical and social history B. Use of alcohol, tobacco or illicit drugs  C. Current medications and supplements D. Functional ability and status E.  Nutritional status F.  Physical activity G. Advance directives H. List of other physicians I.  Hospitalizations, surgeries, and ER visits in previous 12 months J.  [place] to include hearing, vision, cognitive, depression L. Referrals and appointments - none  In addition, I have reviewed and discussed with patient certain preventive protocols, quality metrics, and best  practice recommendations. A written personalized care plan for preventive services as well as general preventive health recommendations were provided to patient.  See attached scanned questionnaire for additional information.   Signed,   Rich Reining, RN Nurse Health Advisor

## 2016-09-01 ENCOUNTER — Ambulatory Visit (INDEPENDENT_AMBULATORY_CARE_PROVIDER_SITE_OTHER): Payer: Medicare HMO | Admitting: Internal Medicine

## 2016-09-01 ENCOUNTER — Encounter: Payer: Self-pay | Admitting: Internal Medicine

## 2016-09-01 ENCOUNTER — Ambulatory Visit: Payer: Medicare HMO

## 2016-09-01 VITALS — BP 172/80 | HR 96 | Temp 98.1°F | Ht 69.0 in | Wt 205.0 lb

## 2016-09-01 DIAGNOSIS — E785 Hyperlipidemia, unspecified: Secondary | ICD-10-CM

## 2016-09-01 DIAGNOSIS — K047 Periapical abscess without sinus: Secondary | ICD-10-CM | POA: Diagnosis not present

## 2016-09-01 DIAGNOSIS — E6609 Other obesity due to excess calories: Secondary | ICD-10-CM | POA: Diagnosis not present

## 2016-09-01 DIAGNOSIS — Z6830 Body mass index (BMI) 30.0-30.9, adult: Secondary | ICD-10-CM | POA: Diagnosis not present

## 2016-09-01 DIAGNOSIS — I10 Essential (primary) hypertension: Secondary | ICD-10-CM

## 2016-09-01 MED ORDER — METOPROLOL SUCCINATE ER 50 MG PO TB24: 50.0000 mg | ORAL_TABLET | Freq: Every day | ORAL | 3 refills | Status: DC

## 2016-09-01 NOTE — Progress Notes (Signed)
Facility  [facility]    Place of Service:   OFFICE    No Known Allergies  Chief Complaint  Patient presents with  . Medical Management of Chronic Issues    6 month medication management blood pressure, cholesterol, insomnia, review lab  . Hospitalization [facility] 05/31/16 to 06/15/16 right mouth and facial abscess. Neck incision and drainage 06/06/16 Dr. Neville Route    HPI:   Essential hypertension - elevated SBP. Using ramipril regularly.  Hyperlipidemia, unspecified hyperlipidemia type - controlled  Class 1 obesity due to excess calories with serious comorbidity and body mass index (BMI) of 30.0 to 30.9 in adult - no weight loss  Abscessed tooth - spent 12 days in hospital [facility] [place] [facility] 05/31/16. Has resolved.    Medications: Patient's Medications  New Prescriptions   No medications on file  Previous Medications   ASPIRIN 81 MG TABLET    Take 81 mg by mouth daily. Take one tablet once a day   B COMPLEX VITAMINS TABLET    Take 1 tablet by mouth daily.   CHOLECALCIFEROL (VITAMIN D) 1000 UNITS TABLET    Take 1,000 Units by mouth daily.    CINNAMON 500 MG TABS    Take 1 tablet by mouth daily.   CITRUS BERGAMOT PO    Take 1 capsule by mouth daily. Take one capsule once daily to lower cholesterol   COENZYME Q10 (CO Q 10) 100 MG CAPS    Take 1 capsule by mouth daily.   DIAZEPAM (VALIUM) 5 MG TABLET    Take 1/2 tablet up to twice daily as needed for anxiety   DOXEPIN (SINEQUAN) 75 MG CAPSULE    One nightly to help nerves and for sleep   FISH OIL-OMEGA-3 FATTY ACIDS 1000 MG CAPSULE    Take 2 g by mouth daily. Take one tablet once a day for cholesterol   FLUTICASONE-SALMETEROL (ADVAIR DISKUS) 250-50 MCG/DOSE AEPB    inhale 1 puff by mouth twice a day   IBUPROFEN (ADVIL,MOTRIN) 200 MG TABLET    Take 200 mg by mouth every 6 (six) hours as needed for pain. Take 3-4 tablets twice a day for neck pain as needed   NUTRITIONAL SUPPLEMENTS (NUTRITIONAL  SUPPLEMENT PO)    Synerflex. Take 2 tablets by mouth daily.   NUTRITIONAL SUPPLEMENTS (NUTRITIONAL SUPPLEMENT PO)    Ginger and Turmeric. Take 1 capsule by mouth daily.   POTASSIUM 99 MG TABS    Take 1 tablet by mouth daily. Take one tablet once a day for cramps   RAMIPRIL (ALTACE) 10 MG CAPSULE    Take 1 capsule (10 mg total) by mouth daily.   RESVERATROL 100 MG CAPS    Take 1 capsule by mouth daily. Take one tablet once a day  Modified Medications   No medications on file  Discontinued Medications   NAPROXEN (NAPROSYN) 250 MG TABLET    Take 1 tablet (250 mg total) by mouth 2 (two) times daily with a meal.    Review of Systems  Constitutional: Negative for activity change, appetite change, fatigue, fever and unexpected weight change.  HENT: Positive for hearing loss. Negative for ear discharge.   Eyes: Negative.   Respiratory: Negative.  Negative for shortness of breath.   Cardiovascular: Positive for leg swelling. Negative for chest pain.       Left carotid stenosis  Gastrointestinal:       Occasional heartburn which seems well controlled. History of hemorrhoids.  Endocrine: Negative.   Genitourinary:       History of kidney stones. Nocturia. Hesitation Obstruction occurred after surgery 06/14/14. Foley Catheter was removed and he is voiding well. Used Flomax 2 daily, but is off this medication now.  Musculoskeletal: Positive for arthralgias and back pain. Negative for neck pain and neck stiffness.       Leg cramps in the past  Skin: Negative.   Neurological:       Episodes of dizziness. None recent. Had an episode of syncope after severe coughing.  Hematological: Negative.   Psychiatric/Behavioral: Positive for self-injury and sleep disturbance. Negative for decreased concentration.       Mild insomnia.    Vitals:   09/01/16 1406  BP: (!) 172/80  Pulse: 96  Temp: 98.1 F (36.7 C)  TempSrc: Oral  SpO2: 98%  Weight: 205 lb (93 kg)  Height: 5' 9" (1.753 m)   Body mass  index is 30.27 kg/m. Wt Readings from Last 3 Encounters:  09/01/16 205 lb (93 kg)  08/30/16 203 lb (92.1 kg)  08/11/16 204 lb (92.5 kg)      Physical Exam  Constitutional: He is oriented to person, place, and time.  Overweight.  HENT:  Mild loss of hearing bilaterally. 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  Eyes:  Corrective lenses.  Neck: No JVD present. No tracheal deviation present. No thyromegaly present.  Healed incision of the left neck from repair of left carotid.  Cardiovascular: Normal rate, regular rhythm, normal heart sounds and intact distal pulses.  Exam reveals no gallop and no friction rub.   No murmur heard. Pulmonary/Chest: Breath sounds normal. No respiratory distress. He has no wheezes. He has no rales. He exhibits no tenderness.  Abdominal: Soft. Bowel sounds are normal. He exhibits no distension and no mass. There is no tenderness.  Musculoskeletal: Normal range of motion. He exhibits no edema or tenderness.  Lymphadenopathy:    He has no cervical adenopathy.  Neurological: He is alert and oriented to person, place, and time. He has normal reflexes. No cranial nerve deficit. Coordination normal.  08/27/15 MMSE: 30/30. Failed clock drawing.  Skin: Skin is warm and dry. No rash noted. No erythema. No pallor.  Psychiatric: He has a normal mood and affect. His behavior is normal. Judgment and thought content normal.    Labs reviewed: Lab Summary Latest Ref Rng & Units 08/30/2016 05/30/2016 03/01/2016  Hemoglobin 13.0 - 17.0 g/dL (None) 12.8(L) 14.6  Hematocrit 39.0 - 52.0 % (None) 37.3(L) 43.8  White count 4.0 - 10.5 K/uL (None) 15.5(H) 9.0  Platelet count 150 - 400 K/uL (None) 279 250  Sodium 135 - 146 mmol/L 140 131(L) 138  Potassium 3.5 - 5.3 mmol/L 4.4 3.8 4.5  Calcium 8.6 - 10.3 mg/dL 9.1 8.8(L) 8.9  Phosphorus - (None) (None) (None)  Creatinine 0.70 - 1.11 mg/dL 1.04 1.03 0.95  AST 10 - 35 U/L 14 (None) 14  Alk Phos 40 - 115 U/L 47 (None) 40    Bilirubin 0.2 - 1.2 mg/dL 0.4 (None) 0.5  Glucose 65 - 99 mg/dL 88 105(H) 91  Cholesterol <200 mg/dL 193 (None) (None)  HDL cholesterol >40 mg/dL 39(L) (None) (None)  Triglycerides <150 mg/dL 106 (None) (None)  LDL Direct - (None) (None) (None)  LDL Calc <100 mg/dL 133(H) (None) (None)  Total protein 6.1 - 8.1 g/dL 7.1 (None) 6.8  Albumin 3.6 - 5.1 g/dL 3.9 (None) 3.9  Some recent data might be hidden   Lab  Results  Component Value Date   TSH 1.390 10/06/2012   Lab Results  Component Value Date   BUN 12 08/30/2016   BUN 13 05/30/2016   BUN 11 03/01/2016   No results found for: HGBA1C  Assessment/Plan  1. Essential hypertension Add  metoprolol succinate (TOPROL-XL) 50 MG 24 hr tablet; Take 1 tablet (50 mg total) by mouth daily. Take with or immediately following a meal.  Dispense: 90 tablet; Refill: 3  2. Hyperlipidemia, unspecified hyperlipidemia type Dietary control  3. Class 1 obesity due to excess calories with serious comorbidity and body mass index (BMI) of 30.0 to 30.9 in adult Encouraged weight loss  4. Abscessed tooth resolved

## 2016-09-07 ENCOUNTER — Other Ambulatory Visit: Payer: Self-pay | Admitting: Internal Medicine

## 2016-09-07 ENCOUNTER — Other Ambulatory Visit: Payer: Self-pay | Admitting: Nurse Practitioner

## 2016-10-01 ENCOUNTER — Other Ambulatory Visit: Payer: Self-pay | Admitting: Internal Medicine

## 2016-11-01 ENCOUNTER — Other Ambulatory Visit: Payer: Self-pay | Admitting: Internal Medicine

## 2016-11-28 ENCOUNTER — Other Ambulatory Visit: Payer: Self-pay | Admitting: Internal Medicine

## 2017-01-01 ENCOUNTER — Other Ambulatory Visit: Payer: Self-pay | Admitting: Internal Medicine

## 2017-01-10 ENCOUNTER — Ambulatory Visit (INDEPENDENT_AMBULATORY_CARE_PROVIDER_SITE_OTHER): Payer: Medicare HMO | Admitting: Internal Medicine

## 2017-01-10 ENCOUNTER — Encounter: Payer: Self-pay | Admitting: Internal Medicine

## 2017-01-10 VITALS — BP 138/82 | HR 85 | Temp 98.3°F | Resp 17 | Ht 69.0 in | Wt 213.4 lb

## 2017-01-10 DIAGNOSIS — M25562 Pain in left knee: Secondary | ICD-10-CM | POA: Diagnosis not present

## 2017-01-10 DIAGNOSIS — G4700 Insomnia, unspecified: Secondary | ICD-10-CM | POA: Diagnosis not present

## 2017-01-10 DIAGNOSIS — I779 Disorder of arteries and arterioles, unspecified: Secondary | ICD-10-CM

## 2017-01-10 DIAGNOSIS — I10 Essential (primary) hypertension: Secondary | ICD-10-CM

## 2017-01-10 DIAGNOSIS — Z6830 Body mass index (BMI) 30.0-30.9, adult: Secondary | ICD-10-CM

## 2017-01-10 DIAGNOSIS — G8929 Other chronic pain: Secondary | ICD-10-CM | POA: Diagnosis not present

## 2017-01-10 DIAGNOSIS — Z23 Encounter for immunization: Secondary | ICD-10-CM

## 2017-01-10 DIAGNOSIS — M25561 Pain in right knee: Secondary | ICD-10-CM | POA: Diagnosis not present

## 2017-01-10 DIAGNOSIS — E785 Hyperlipidemia, unspecified: Secondary | ICD-10-CM | POA: Diagnosis not present

## 2017-01-10 DIAGNOSIS — I2510 Atherosclerotic heart disease of native coronary artery without angina pectoris: Secondary | ICD-10-CM | POA: Diagnosis not present

## 2017-01-10 DIAGNOSIS — I739 Peripheral vascular disease, unspecified: Secondary | ICD-10-CM

## 2017-01-10 DIAGNOSIS — E6609 Other obesity due to excess calories: Secondary | ICD-10-CM

## 2017-01-10 MED ORDER — DOXEPIN HCL 50 MG PO CAPS: 50.0000 mg | ORAL_CAPSULE | Freq: Every evening | ORAL | 3 refills | Status: DC | PRN

## 2017-01-10 NOTE — Progress Notes (Signed)
.pi   Location:   [facility]   Place of Service:  Clinic  Provider: Shaquira Moroz L. Mariea Clonts, D.O., C.M.D.  Code Status: full code Goals of Care:  Advanced Directives 01/10/2017  Does Patient Have a Medical Advance Directive? Yes  Type of Paramedic of [place];Living will  Does patient want to make changes to medical advance directive? -  Copy of [place] in Chart? No - copy requested   Chief Complaint  Patient presents with  . Medical Management of Chronic Issues    39mth follow-up, transfer from Dr. Nyoka Cowden    HPI: Patient is a 83 y.o. [name] seen today for medical management of chronic diseases and to transfer care from Dr. Nyoka Cowden who has retired.    HTN- BP today 138/52 today Managed on ramipril and metoprolol   CHF- Sleeps sitting up at the beginning of the night but does not have trouble laying down and sleeping.  CAD- Sees cardiology Dr Trula Slade and Dr Gwenlyn Found Had CABG in 2000, has not had any chest pain since.  Carotid artery disease: Managed by Dr Trula Slade had carotid endarterectomy 06/2014  Knee pain-Taking herbal supplements turmeric and frankincense states that this is working well for him.  Insomnia- Takes doxepin 75mg  at bedtime wants to decrease to 50mg  due to grogginess.  Tried to alternate, one night on, one night off.  He does sleep 7-8 hrs per night.    Was using the valium for dizziness that may be a vertigo type presentation.  He says it helps.    Says he takes apple cider vinegar for reflux.  Had been on PPI for a long time.  Now has zero reflux on the vinegar.    Past Medical History:  Diagnosis Date  . Acquired deformity of nose   . Calculus of kidney   . Carotid artery disease ([place])   . Cellulitis and abscess of unspecified site   . Cervicalgia   . CHF (congestive heart failure) ([place])   . Coronary atherosclerosis of unspecified type of vessel, native or graft   . Dizziness and giddiness   . Encounter for long-term (current)  use of other medications   . Esophagitis, unspecified   . Hyperlipidemia   . Hypertrophy of prostate with urinary obstruction and other lower urinary tract symptoms (LUTS)   . Insomnia, unspecified   . Internal hemorrhoids without mention of complication   . Obesity, unspecified   . Old myocardial infarction   . Other and unspecified hyperlipidemia   . Other premature beats   . Palpitations   . 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 Shortness of breath   . Slowing of urinary stream   . Special screening for malignant neoplasm of prostate   . Unspecified essential hypertension   . 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 Unspecified vitamin D deficiency   . Urinary frequency     Past Surgical History:  Procedure Laterality Date  . CABG X 4  2000  . CARDIAC CATHETERIZATION    . CAROTID ENDARTERECTOMY Left 06-14-14   CE  . COLONOSCOPY  2007   Dr Lajoyce Corners, normal  . CORONARY ARTERY BYPASS GRAFT     2000  . ENDARTERECTOMY Left 06/14/2014   Procedure: LEFT CAROTID ENDARTERECTOMY WITH VASCUGUARD PATCH ANGIOPLASTY;  Surgeon: Serafina Mitchell, MD;  Location: [facility];  Service: Vascular;  Laterality: Left;  . ESOPHAGOGASTRODUODENOSCOPY ENDOSCOPY  2007   Dr Lajoyce Corners, with biopsy  . EXCISIONAL HEMORRHOIDECTOMY  1981  . LESION FROM FOREHEAD,(R) EAR (L) ARM     DR Almira Coaster  . LITHOTRIPSY  2005   Dr Almira Coaster  . TONSILLECTOMY  1940  . trench in right side of jaw after abcesses  06/01/2016    No Known Allergies  Allergies as of 01/10/2017   No Known Allergies     Medication List       Accurate as of 01/10/17  1:14 PM. Always use your most recent med list.          aspirin 81 MG tablet Take 81 mg by mouth daily. Take one tablet once a day   b complex vitamins tablet Take 1 tablet by mouth daily.   cholecalciferol 1000 units tablet Commonly known as:  VITAMIN D Take 1,000 Units by mouth daily.     Cinnamon 500 MG Tabs Take 1 tablet by mouth daily.   CITRUS BERGAMOT PO Take 1 capsule by mouth daily. Take one capsule once daily to lower cholesterol   Co Q 10 100 MG Caps Take 1 capsule by mouth daily.   diazepam 5 MG tablet Commonly known as:  VALIUM take 1/2 tablet by mouth UP TO TWICE A DAY IF NEEDED FOR ANXIETY   doxepin 75 MG capsule Commonly known as:  SINEQUAN take 1 capsule by mouth NIGHTLY TO HELP NERVES AND FOR SLEEP   fish oil-omega-3 fatty acids 1000 MG capsule Take 2 g by mouth daily. Take one tablet once a day for cholesterol   Fluticasone-Salmeterol 250-50 MCG/DOSE Aepb Commonly known as:  ADVAIR DISKUS inhale 1 puff by mouth twice a day   ibuprofen 200 MG tablet Commonly known as:  ADVIL,MOTRIN Take 200 mg by mouth every 6 (six) hours as needed for pain. Take 3-4 tablets twice a day for neck pain as needed   metoprolol succinate 50 MG 24 hr tablet Commonly known as:  TOPROL-XL Take 1 tablet (50 mg total) by mouth daily. Take with or immediately following a meal.   NUTRITIONAL SUPPLEMENT PO Ginger and Turmeric. Take 1 capsule by mouth daily.   Potassium 99 MG Tabs Take 1 tablet by mouth daily. Take one tablet once a day for cramps   ramipril 10 MG capsule Commonly known as:  ALTACE take 1 capsule by mouth once daily   Resveratrol 100 MG Caps Take 1 capsule by mouth daily. Take one tablet once a day       Review of Systems:  Review of Systems  Constitutional: Negative for malaise/fatigue and weight loss.  HENT: Positive for hearing loss.        Has trouble with [name] voices  Eyes: Negative for blurred vision.       Glasses  Respiratory: Positive for shortness of breath. Negative for cough.   Cardiovascular: Negative for chest pain and palpitations.  Musculoskeletal: Positive for joint pain.  Neurological: Positive for dizziness. Negative for tingling, tremors, weakness and headaches.       Occasional dizziness with movement and going up  and down stairs states that the diazepam helps with this.  Psychiatric/Behavioral: The patient is nervous/anxious and has insomnia.     Health Maintenance  Topic Date Due  . TETANUS/TDAP  05/03/2014  . INFLUENZA VACCINE  12/01/2016  . PNA vac Low Risk Adult  Completed    Physical Exam: Vitals:   01/10/17 1257  BP: 138/82  Pulse: 85  Resp: 17  Temp: 98.3 F (36.8 C)  TempSrc: Oral  SpO2: 96%  Weight: 213 lb 6.4 oz (96.8 kg)  Height: 5\' 9"  (1.753 m)   Body mass index is 31.51 kg/m. Physical Exam  Constitutional: He is oriented to person, place, and time. He appears well-developed and well-nourished.  HENT:  Head: Normocephalic and atraumatic.  Cardiovascular: Normal rate, regular rhythm, normal heart sounds and intact distal pulses.   Pulmonary/Chest: Effort normal and breath sounds normal.  Abdominal: Bowel sounds are normal.  Musculoskeletal: Normal range of motion.  Neurological: He is alert and oriented to person, place, and time.  Some word finding difficulty  Skin: Skin is warm and dry. Capillary refill takes less than 2 seconds.  Psychiatric: He has a normal mood and affect. His behavior is normal. Judgment and thought content normal.   Labs reviewed: Basic Metabolic Panel:  Recent Labs  03/01/16 0857 05/30/16 1649 08/30/16 0954  NA 138 131* 140  K 4.5 3.8 4.4  CL 104 96* 104  CO2 26 24 27   GLUCOSE 91 105* 88  BUN 11 13 12   CREATININE 0.95 1.03 1.04  CALCIUM 8.9 8.8* 9.1   Liver Function Tests:  Recent Labs  03/01/16 0857 08/30/16 0954  AST 14 14  ALT 11 10  ALKPHOS 40 47  BILITOT 0.5 0.4  PROT 6.8 7.1  ALBUMIN 3.9 3.9   No results for input(s): LIPASE, AMYLASE in the last 8760 hours. No results for input(s): AMMONIA in the last 8760 hours. CBC:  Recent Labs  03/01/16 0857 05/30/16 1649  WBC 9.0 15.5*  NEUTROABS 5,850 12.3*  HGB 14.6 12.8*  HCT 43.8 37.3*  MCV 98.0 94.7  PLT 250 279   Lipid Panel:  Recent Labs  08/30/16 0954   CHOL 193  HDL 39*  LDLCALC 133*  TRIG 106  CHOLHDL 4.9   Assessment/Plan 1. Essential hypertension -bp at goal with current therapy, cont same regimen  2. Insomnia, unspecified type -will try to taper him off of doxepin -he plans to take 50 alternating with 75 for a bit, then go to 50 nightly until next time, then we'll try to go to 25mg , then 10mg  - doxepin (SINEQUAN) 50 MG capsule; Take 1 capsule (50 mg total) by mouth at bedtime as needed (insomnia, anxiety).  Dispense: 30 capsule; Refill: 3  3. Hyperlipidemia, unspecified hyperlipidemia type -last LDL was well above goal in April -only on fish oil, not on statin, has h/o CAD s/p CABG in 2000 - Lipid panel; Future  4. Class 1 obesity due to excess calories with serious comorbidity and body mass index (BMI) of 30.0 to 30.9 in adult - ongoing, says he's going to walk when the weather cools down (can't go out over 80 degrees since his CABG he reports) - Hemoglobin A1c; Future - Lipid panel; Future - CBC with Differential/Platelet; Future - COMPLETE METABOLIC PANEL WITH GFR; Future  5. Chronic pain of both knees -ongoing, uses ibuprofen, and supplements for this, some due to obesity and likely OA  6. Bilateral carotid artery disease ([place]) -monitored by vascular, cont asa  7. Atherosclerosis of native coronary artery of native heart without angina pectoris -with prior CABG, needs better lipid control ideally with last LDL well over 70  8. Need for immunization against influenza - Flu vaccine HIGH DOSE PF (Fluzone High dose)  Labs/tests ordered:   Orders Placed This Encounter  Procedures  . Flu vaccine HIGH DOSE PF (Fluzone High dose)  . Hemoglobin A1c    Standing Status:   Future  Standing Expiration Date:   05/12/2026  . Lipid panel    Standing Status:   Future    Standing Expiration Date:   05/12/2026  . CBC with Differential/Platelet    Standing Status:   Future    Standing Expiration Date:   05/12/2026  .  COMPLETE METABOLIC PANEL WITH GFR    Standing Status:   Future    Standing Expiration Date:   05/12/2026   Next appt:  6 mos med mgt, labs before  Abshir Paolini L. Shivaun Bilello, D.O. [facility] [address], [place] [address] Cell Phone (Mon-Fri 8am-5pm):  [phone] On Call:  [phone] & follow prompts after 5pm & weekends Office Phone:  [phone] Office Fax:  [phone]

## 2017-02-15 ENCOUNTER — Other Ambulatory Visit: Payer: Self-pay | Admitting: Internal Medicine

## 2017-04-09 ENCOUNTER — Other Ambulatory Visit: Payer: Self-pay | Admitting: Internal Medicine

## 2017-04-14 ENCOUNTER — Other Ambulatory Visit: Payer: Self-pay | Admitting: *Deleted

## 2017-04-14 DIAGNOSIS — G4700 Insomnia, unspecified: Secondary | ICD-10-CM

## 2017-04-14 MED ORDER — DOXEPIN HCL 25 MG PO CAPS: 25.0000 mg | ORAL_CAPSULE | Freq: Every day | ORAL | 0 refills | Status: DC

## 2017-04-14 NOTE — Telephone Encounter (Signed)
[facility].  Per Dr. Magdalene Molly last OV note to decrease.

## 2017-04-19 ENCOUNTER — Telehealth: Payer: Self-pay | Admitting: *Deleted

## 2017-04-19 NOTE — Telephone Encounter (Signed)
Received Prior Authorization Request from pharmacy for Doxepin 25mg . Initiated through [employer] [facility]. Key [id number] Went into determination. 48-72 hours. Awaiting response.   [id number] PA Case [id number]

## 2017-05-19 ENCOUNTER — Other Ambulatory Visit: Payer: Self-pay | Admitting: Internal Medicine

## 2017-06-13 ENCOUNTER — Other Ambulatory Visit: Payer: Self-pay | Admitting: *Deleted

## 2017-06-13 DIAGNOSIS — I10 Essential (primary) hypertension: Secondary | ICD-10-CM

## 2017-06-13 MED ORDER — METOPROLOL SUCCINATE ER 50 MG PO TB24: 50.0000 mg | ORAL_TABLET | Freq: Every day | ORAL | 3 refills | Status: DC

## 2017-06-13 NOTE — Telephone Encounter (Signed)
[facility] 

## 2017-07-06 ENCOUNTER — Other Ambulatory Visit: Payer: Self-pay | Admitting: *Deleted

## 2017-07-06 ENCOUNTER — Other Ambulatory Visit: Payer: Medicare HMO

## 2017-07-06 DIAGNOSIS — Z6830 Body mass index (BMI) 30.0-30.9, adult: Secondary | ICD-10-CM | POA: Diagnosis not present

## 2017-07-06 DIAGNOSIS — E6609 Other obesity due to excess calories: Secondary | ICD-10-CM

## 2017-07-06 DIAGNOSIS — E785 Hyperlipidemia, unspecified: Secondary | ICD-10-CM

## 2017-07-06 MED ORDER — RAMIPRIL 10 MG PO CAPS: 10.0000 mg | ORAL_CAPSULE | Freq: Every day | ORAL | 1 refills | Status: DC

## 2017-07-06 NOTE — Telephone Encounter (Signed)
[facility] 

## 2017-07-07 ENCOUNTER — Other Ambulatory Visit: Payer: Medicare HMO

## 2017-07-07 LAB — CBC WITH DIFFERENTIAL/PLATELET
Basophils Absolute: 61 cells/uL (ref 0–200)
Basophils Relative: 0.8 %
Eosinophils Absolute: 258 cells/uL (ref 15–500)
Eosinophils Relative: 3.4 %
HCT: 42.6 % (ref 38.5–50.0)
Hemoglobin: 14.6 g/dL (ref 13.2–17.1)
Lymphs Abs: 2554 cells/uL (ref 850–3900)
MCH: 32.9 pg (ref 27.0–33.0)
MCHC: 34.3 g/dL (ref 32.0–36.0)
MCV: 95.9 fL (ref 80.0–100.0)
MPV: 10.1 fL (ref 7.5–12.5)
Monocytes Relative: 7.5 %
Neutro Abs: 4157 cells/uL (ref 1500–7800)
Neutrophils Relative %: 54.7 %
Platelets: 238 10*3/uL (ref 140–400)
RBC: 4.44 10*6/uL (ref 4.20–5.80)
RDW: 12.2 % (ref 11.0–15.0)
Total Lymphocyte: 33.6 %
WBC mixed population: 570 cells/uL (ref 200–950)
WBC: 7.6 10*3/uL (ref 3.8–10.8)

## 2017-07-07 LAB — COMPLETE METABOLIC PANEL WITH GFR
AG Ratio: 1.5 (calc) (ref 1.0–2.5)
ALT: 12 U/L (ref 9–46)
AST: 17 U/L (ref 10–35)
Albumin: 4.1 g/dL (ref 3.6–5.1)
Alkaline phosphatase (APISO): 37 U/L — ABNORMAL LOW (ref 40–115)
BUN: 17 mg/dL (ref 7–25)
CO2: 29 mmol/L (ref 20–32)
Calcium: 9 mg/dL (ref 8.6–10.3)
Chloride: 104 mmol/L (ref 98–110)
Creat: 1.07 mg/dL (ref 0.70–1.11)
GFR, Est African American: 74 mL/min/{1.73_m2} (ref >=60)
GFR, Est Non African American: 64 mL/min/{1.73_m2} (ref >=60)
Globulin: 2.7 g/dL (calc) (ref 1.9–3.7)
Glucose, Bld: 96 mg/dL (ref 65–99)
Potassium: 4.5 mmol/L (ref 3.5–5.3)
Sodium: 140 mmol/L (ref 135–146)
Total Bilirubin: 0.4 mg/dL (ref 0.2–1.2)
Total Protein: 6.8 g/dL (ref 6.1–8.1)

## 2017-07-07 LAB — LIPID PANEL
Cholesterol: 204 mg/dL — ABNORMAL HIGH (ref <200)
HDL: 47 mg/dL (ref >40)
LDL Cholesterol (Calc): 140 mg/dL (calc) — ABNORMAL HIGH
Non-HDL Cholesterol (Calc): 157 mg/dL (calc) — ABNORMAL HIGH (ref <130)
Total CHOL/HDL Ratio: 4.3 (calc) (ref <5.0)
Triglycerides: 74 mg/dL (ref <150)

## 2017-07-07 LAB — HEMOGLOBIN A1C
Hgb A1c MFr Bld: 5.7 % of total Hgb — ABNORMAL HIGH (ref <5.7)
Mean Plasma Glucose: 117 (calc)
eAG (mmol/L): 6.5 (calc)

## 2017-07-11 ENCOUNTER — Ambulatory Visit: Payer: Medicare HMO | Admitting: Internal Medicine

## 2017-07-14 ENCOUNTER — Encounter: Payer: Self-pay | Admitting: Internal Medicine

## 2017-07-14 ENCOUNTER — Ambulatory Visit (INDEPENDENT_AMBULATORY_CARE_PROVIDER_SITE_OTHER): Payer: Medicare HMO | Admitting: Internal Medicine

## 2017-07-14 VITALS — BP 138/80 | HR 74 | Temp 98.3°F | Ht 69.0 in | Wt 217.4 lb

## 2017-07-14 DIAGNOSIS — I10 Essential (primary) hypertension: Secondary | ICD-10-CM

## 2017-07-14 DIAGNOSIS — E6609 Other obesity due to excess calories: Secondary | ICD-10-CM

## 2017-07-14 DIAGNOSIS — I779 Disorder of arteries and arterioles, unspecified: Secondary | ICD-10-CM

## 2017-07-14 DIAGNOSIS — E785 Hyperlipidemia, unspecified: Secondary | ICD-10-CM

## 2017-07-14 DIAGNOSIS — J00 Acute nasopharyngitis [common cold]: Secondary | ICD-10-CM | POA: Diagnosis not present

## 2017-07-14 DIAGNOSIS — Z6830 Body mass index (BMI) 30.0-30.9, adult: Secondary | ICD-10-CM

## 2017-07-14 DIAGNOSIS — I739 Peripheral vascular disease, unspecified: Secondary | ICD-10-CM

## 2017-07-14 DIAGNOSIS — G5601 Carpal tunnel syndrome, right upper limb: Secondary | ICD-10-CM

## 2017-07-14 NOTE — Patient Instructions (Addendum)
It appears you have a some irritation of the median nerve.   Wear the splint at night. Lets see if this improves the tingling and numbness of the thumb  Continue your OTC treatment of the cold  Look into walking, eat a better more balance.

## 2017-07-14 NOTE — Progress Notes (Signed)
Location:  [facility] clinic Provider:  Tiffany L. Mariea Clonts, D.O., C.M.D.  Code Status: FC Goals of Care:  Advanced Directives 01/10/2017  Does Patient Have a Medical Advance Directive? Yes  Type of Paramedic of [place];Living will  Does patient want to make changes to medical advance directive? -  Copy of [place] in Chart? No - copy requested     Chief Complaint  Patient presents with  . Medical Management of Chronic Issues    6 month F/U; patient c/o having a cough, congestion; onset Sunday taking alka seltzer plus 4 x daily  . Medication Refill    No Refills    HPI: Patient is a 85 y.o. male seen today for medical management of chronic diseases. Overall feeling okay outside of being sick. He has had on going sinus issues most of his life he reports. He is blowing his nose every five minutes. He started taking alka selter plus and he feels this is helping him and the does not need any medication today.   He only complaint today in office is his right thumb, which started bother him 6 months ago. He reports it starts tingling and some numbness. There is no radiation into wrist or fingers. He denies pain, burning. He first notices this first thing in the morning. He denies it waking him up in the middle of the night. He does not feel that he notices before bed. He is right handed.   Denies headaches. Denies injury to neck Denies injury to thumb, hand or wrist.  Denies vision changes-does wear glasses and has cataracts.  Denies changes in speech. Denies weakness in arms and legs.  There is no discoloration.   Past Medical History:  Diagnosis Date  . Acquired deformity of nose   . Calculus of kidney   . Carotid artery disease ([facility])   . Cellulitis and abscess of unspecified site   . Cervicalgia   . CHF (congestive heart failure) ([facility])   . Coronary atherosclerosis of unspecified type of vessel, native or graft   . Dizziness and giddiness     . Encounter for long-term (current) use of other medications   . Esophagitis, unspecified   . Hyperlipidemia   . Hypertrophy of prostate with urinary obstruction and other lower urinary tract symptoms (LUTS)   . Insomnia, unspecified   . Internal hemorrhoids without mention of complication   . Obesity, unspecified   . Old myocardial infarction   . Other and unspecified hyperlipidemia   . Other premature beats   . Palpitations   . 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 Shortness of breath   . Slowing of urinary stream   . Special screening for malignant neoplasm of prostate   . Unspecified essential hypertension   . 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 Unspecified vitamin D deficiency   . Urinary frequency     Past Surgical History:  Procedure Laterality Date  . CABG X 4  2000  . CARDIAC CATHETERIZATION    . CAROTID ENDARTERECTOMY Left 06-14-14   CE  . COLONOSCOPY  2007   Dr Orr, normal  . CORONARY ARTERY BYPASS GRAFT     20 00  . ENDARTERECTOMY Left 06/14/2014   Procedure: LEFT CAROTID ENDARTERECTOMY WITH VASCUGUARD PATCH ANGIOPLASTY;  Surgeon: Serafina Mitchell, MD;  Location: [facility];  Service: Vascular;  Laterality: Left;  . ESOPHAGOGASTRODUODENOSCOPY  ENDOSCOPY  2007   Dr Lajoyce Corners, with biopsy  . EXCISIONAL HEMORRHOIDECTOMY  1981  . LESION FROM FOREHEAD,(R) EAR (L) ARM     DR Almira Coaster  . LITHOTRIPSY  2005   Dr Almira Coaster  . TONSILLECTOMY  1940  . trench in right side of jaw after abcesses  06/01/2016    No Known Allergies  Outpatient Encounter Medications as of 07/14/2017  Medication Sig  . aspirin 81 MG tablet Take 81 mg by mouth daily. Take one tablet once a day  . b complex vitamins tablet Take 1 tablet by mouth daily.  . cholecalciferol (VITAMIN D) 1000 UNITS tablet Take 1,000 Units by mouth daily.   . Cinnamon 500 MG TABS Take 1 tablet by mouth daily.  Marland Kitchen CITRUS BERGAMOT PO Take  1 capsule by mouth daily. Take one capsule once daily to lower cholesterol  . Coenzyme Q10 (CO Q 10) 100 MG CAPS Take 1 capsule by mouth daily.  . diazepam (VALIUM) 5 MG tablet Take 0.5 tablets (2.5 mg total) by mouth 2 (two) times daily.  Marland Kitchen doxepin (SINEQUAN) 25 MG capsule Take 1 capsule (25 mg total) by mouth at bedtime. As needed (insomnia, anxiety)  . fish oil-omega-3 fatty acids 1000 MG capsule Take 2 g by mouth daily. Take one tablet once a day for cholesterol  . Fluticasone-Salmeterol (ADVAIR DISKUS) 250-50 MCG/DOSE AEPB inhale 1 puff by mouth twice a day  . ibuprofen (ADVIL,MOTRIN) 200 MG tablet Take 200 mg by mouth every 6 (six) hours as needed for pain. Take 3-4 tablets twice a day for neck pain as needed  . metoprolol succinate (TOPROL-XL) 50 MG 24 hr tablet Take 1 tablet (50 mg total) by mouth daily. Take with or immediately following a meal.  . Nutritional Supplements (NUTRITIONAL SUPPLEMENT PO) Ginger and Turmeric. Take 1 capsule by mouth daily.  . Potassium 99 MG TABS Take 1 tablet by mouth daily. Take one tablet once a day for cramps  . ramipril (ALTACE) 10 MG capsule Take 1 capsule (10 mg total) by mouth daily.  Marland Kitchen RESVERATROL 100 MG CAPS Take 1 capsule by mouth daily. Take one tablet once a day   No facility-administered encounter medications on file as of 07/14/2017.     Review of Systems:  Review of Systems  Constitutional: Negative for chills, fever and malaise/fatigue.  Eyes:       Glasses  Respiratory: Positive for shortness of breath. Negative for cough.   Cardiovascular: Negative for chest pain, palpitations and leg swelling.  Musculoskeletal: Negative.   Neurological: Positive for tingling and sensory change.       Thumb (right)   Psychiatric/Behavioral: Negative for depression and memory loss. The patient is not nervous/anxious and does not have insomnia.     Health Maintenance  Topic Date Due  . TETANUS/TDAP  05/03/2014  . INFLUENZA VACCINE  Completed  . PNA  vac Low Risk Adult  Completed    Physical Exam: Vitals:   07/14/17 1426  BP: 138/80  Pulse: 74  Temp: 98.3 F (36.8 C)  TempSrc: Oral  SpO2: 94%  Weight: 217 lb 6.4 oz (98.6 kg)  Height: 5\' 9"  (1.753 m)   Body mass index is 32.1 kg/m. Physical Exam  Constitutional: He is oriented to person, place, and time. He appears well-developed and well-nourished.  Cardiovascular: Normal rate, regular rhythm, normal heart sounds and intact distal pulses.  Pulmonary/Chest: Effort normal and breath sounds normal.  Musculoskeletal: Normal range of motion.  Neurological: He is alert and  oriented to person, place, and time. He has normal strength.  Some difficulty with finding words, but overall he is doing well cognitively   Positive phalen's Negative Tinels   Skin: Skin is warm and dry. Capillary refill takes less than 2 seconds.  Psychiatric: He has a normal mood and affect. His behavior is normal. Judgment and thought content normal.  Vitals reviewed.   Labs reviewed: Basic Metabolic Panel: Recent Labs    08/30/16 0954 07/06/17 0901  NA 140 140  K 4.4 4.5  CL 104 104  CO2 27 29  GLUCOSE 88 96  BUN 12 17  CREATININE 1.04 1.07  CALCIUM 9.1 9.0   Liver Function Tests: Recent Labs    08/30/16 0954 07/06/17 0901  AST 14 17  ALT 10 12  ALKPHOS 47  --   BILITOT 0.4 0.4  PROT 7.1 6.8  ALBUMIN 3.9  --    No results for input(s): LIPASE, AMYLASE in the last 8760 hours. No results for input(s): AMMONIA in the last 8760 hours. CBC: Recent Labs    07/06/17 0901  WBC 7.6  NEUTROABS 4,157  HGB 14.6  HCT 42.6  MCV 95.9  PLT 238   Lipid Panel: Recent Labs    08/30/16 0954 07/06/17 0901  CHOL 193 204*  HDL 39* 47  LDLCALC 133* 140*  TRIG 106 74  CHOLHDL 4.9 4.3   Lab Results  Component Value Date   HGBA1C 5.7 (H) 07/06/2017    Procedures since last visit: No results found.  Assessment/Plan  1. Essential hypertension Today in office he is controlled. Will  maintain current regimen. Assess with future labs.   - CBC with Differential/Platelet; Future - COMPLETE METABOLIC PANEL WITH GFR; Future  2. Hyperlipidemia, unspecified hyperlipidemia type His LDL is elevated and he reports not eating as he should. He is trying to improve his activity level and start to reduce his sweets intake. Encourage this as well as drinking water and eating a more balanced diet. He does not want to be on a statin, he rather try his "naturally"  - Lipid panel; Future  3. Class 1 obesity due to excess calories with serious comorbidity and body mass index (BMI) of 30.0 to 30.9 in adult He continues to be overweight, but he reports he is walking more now and is actively trying to lose weight and eat better. Discussed ways to get exercise in throughout the day and week when weather is bad.   - Hemoglobin A1c; Future - Lipid panel; Future  4. Acute carpal tunnel syndrome, right He has on-going thumb tingling and numbness. He was positive Phalen and negative tinel's. Will try a night time splint to help him avoid compression as it is most bothersome in the morning hours. Advised to contact office if this does not improve or if he develops pain with it.   5. Acute nasopharyngitis He has been treating a cold with OTC medications. Advised to continue this as he reports he is feeling much better over the last several days. Advised to call clinic if symptoms fail to resolve or if he gets worse.  6. Bilateral carotid artery disease, unspecified type [facility]) He has had a Left CEA in 2016. His most recent scan was in 4/18 which showed a wide open Left carotid and 40% stenosis Right carotid. He was concerned about a stroke or clot due to his thumb presentation. But he does not demonstrate signs of either of these on exam today. Next doppler study for  carotids is in April.     Labs/tests ordered:  Orders Placed This Encounter  Procedures  . Hemoglobin A1c    Standing Status:    Future    Standing Expiration Date:   07/15/2018  . Lipid panel    Standing Status:   Future    Standing Expiration Date:   07/15/2018  . CBC with Differential/Platelet    Standing Status:   Future    Standing Expiration Date:   07/15/2018  . COMPLETE METABOLIC PANEL WITH GFR    Standing Status:   Future    Standing Expiration Date:   07/15/2018    Next appt:  Visit date not found   Karen Kays, RN, AGPCNP, [facility] [phone] [place], [place] [address] Cell Phone (Mon-Fri 8am-5pm):  [phone] On Call:  [phone] & follow prompts after 5pm & weekends Office Phone:  [phone] Office Fax:  [phone]

## 2017-07-19 ENCOUNTER — Other Ambulatory Visit: Payer: Self-pay | Admitting: Internal Medicine

## 2017-07-19 NOTE — Telephone Encounter (Signed)
A medication refill was received from pharmacy for diazepam 5 mg. Rx was called in to pharmacy after verifying last fill date, provider, and quantity on PMP [place] database.   

## 2017-08-15 ENCOUNTER — Other Ambulatory Visit: Payer: Self-pay

## 2017-08-15 ENCOUNTER — Ambulatory Visit (HOSPITAL_COMMUNITY)
Admission: RE | Admit: 2017-08-15 | Discharge: 2017-08-15 | Disposition: A | Discharge status: Home / Self Care | Payer: Medicare HMO | Source: Ambulatory Visit | Attending: Family | Admitting: Family

## 2017-08-15 ENCOUNTER — Ambulatory Visit: Payer: Medicare HMO | Admitting: Family

## 2017-08-15 ENCOUNTER — Other Ambulatory Visit: Payer: Self-pay | Admitting: Internal Medicine

## 2017-08-15 ENCOUNTER — Encounter: Payer: Self-pay | Admitting: Family

## 2017-08-15 VITALS — BP 141/73 | HR 70 | Temp 98.5°F | Resp 18 | Ht 69.0 in | Wt 215.1 lb

## 2017-08-15 DIAGNOSIS — G4700 Insomnia, unspecified: Secondary | ICD-10-CM

## 2017-08-15 DIAGNOSIS — Z9889 Other specified postprocedural states: Secondary | ICD-10-CM | POA: Insufficient documentation

## 2017-08-15 DIAGNOSIS — I6523 Occlusion and stenosis of bilateral carotid arteries: Secondary | ICD-10-CM | POA: Diagnosis not present

## 2017-08-15 MED ORDER — DOXEPIN HCL 25 MG PO CAPS: 25.0000 mg | ORAL_CAPSULE | Freq: Every day | ORAL | 0 refills | Status: DC

## 2017-08-15 NOTE — Progress Notes (Signed)
Chief Complaint: Follow up Extracranial Carotid Artery Stenosis   History of Present Illness  Alex [name] is a 84 y.o. male who is s/p left carotid endarterectomy with bovine pericardial patch angioplasty on 06/14/2014 by Dr. Trula Slade. Intraoperative findings included 85% stenosis. The lesion was approximately 2.5 cm above the carotid bifurcation. The patient had a very stiff neck making exposure difficult therefore a shunt was not placed.  He remains neurologically intact.  The patient denies any history of TIA or stroke symptoms, specifically he denies a history of amaurosis fugax or monocular blindness, unilateral facial drooping, hemiplegia, or receptive or expressive aphasia.   He denies claudication symptoms with walking, but does report moderate issues with his knees.   He had a severe dental abscess that required 12 days hospitalization at [facility], then [facility] in [place]. He required an I&D of right mandible area. Since this he has some numbness on the right side of his face and lips.   Pt Diabetic: borderline, A1C was 5.7 on 07-06-17 Pt smoker: former smoker, quit in the 1980's  Pt meds include: Statin :no ASA: yes Other anticoagulants/antiplatelets: no  Past Medical History:  Diagnosis Date  . Acquired deformity of nose   . Calculus of kidney   . Carotid artery disease ([place])   . Cellulitis and abscess of unspecified site   . Cervicalgia   . CHF (congestive heart failure) ([place])   . Coronary atherosclerosis of unspecified type of vessel, native or graft   . Dizziness and giddiness   . Encounter for long-term (current) use of other medications   . Esophagitis, unspecified   . Hyperlipidemia   . Hypertrophy of prostate with urinary obstruction and other lower urinary tract symptoms (LUTS)   . Insomnia, unspecified   . Internal hemorrhoids without mention of complication   . Obesity, unspecified   . Old myocardial infarction   . Other and  unspecified hyperlipidemia   . Other premature beats   . Palpitations   . 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 Shortness of breath   . Slowing of urinary stream   . Special screening for malignant neoplasm of prostate   . Unspecified essential hypertension   . 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 Unspecified vitamin D deficiency   . Urinary frequency     Social History Social History   Tobacco Use  . Smoking status: Former Smoker    Packs/day: 2.00    Years: 25.00    Pack years: 50.00    Types: Cigarettes    Last attempt to quit: 05/03/1973    Years since quitting: 44.3  . Smokeless tobacco: Never Used  Substance Use Topics  . Alcohol use: Yes    Alcohol/week: 2.4 oz    Types: 1 Glasses of wine, 3 Cans of beer per week  . Drug use: No    Family History Family History  Problem Relation Age of Onset  . Cancer Mother        LIVER  . Cancer Brother        PROSTATE  . Cancer Daughter     Surgical History Past Surgical History:  Procedure Laterality Date  . CABG X 4  2000  . CARDIAC CATHETERIZATION    . CAROTID ENDARTERECTOMY Left 06-14-14   CE  . COLONOSCOPY  2007   Dr Lajoyce Corners, normal  . CORONARY ARTERY BYPASS GRAFT  2000  . ENDARTERECTOMY Left 06/14/2014   Procedure: LEFT CAROTID ENDARTERECTOMY WITH VASCUGUARD PATCH ANGIOPLASTY;  Surgeon: Serafina Mitchell, MD;  Location: [place];  Service: Vascular;  Laterality: Left;  . ESOPHAGOGASTRODUODENOSCOPY ENDOSCOPY  2007   Dr Lajoyce Corners, with biopsy  . EXCISIONAL HEMORRHOIDECTOMY  1981  . LESION FROM FOREHEAD,(R) EAR (L) ARM     DR Almira Coaster  . LITHOTRIPSY  2005   Dr Almira Coaster  . TONSILLECTOMY  1940  . trench in right side of jaw after abcesses  06/01/2016    No Known Allergies  Current Outpatient Medications  Medication Sig Dispense Refill  . aspirin 81 MG tablet Take 81 mg by mouth daily. Take one tablet once a day    . b complex  vitamins tablet Take 1 tablet by mouth daily.    . cholecalciferol (VITAMIN D) 1000 UNITS tablet Take 1,000 Units by mouth daily.     . Cinnamon 500 MG TABS Take 1 tablet by mouth daily.    Marland Kitchen CITRUS BERGAMOT PO Take 1 capsule by mouth daily. Take one capsule once daily to lower cholesterol    . Coenzyme Q10 (CO Q 10) 100 MG CAPS Take 1 capsule by mouth daily.    . diazepam (VALIUM) 5 MG tablet TAKE 1/2 TABLET BY MOUTH TWICE A DAY 30 tablet 0  . fish oil-omega-3 fatty acids 1000 MG capsule Take 2 g by mouth daily. Take one tablet once a day for cholesterol    . Fluticasone-Salmeterol (ADVAIR DISKUS) 250-50 MCG/DOSE AEPB inhale 1 puff by mouth twice a day 60 each 5  . ibuprofen (ADVIL,MOTRIN) 200 MG tablet Take 200 mg by mouth every 6 (six) hours as needed for pain. Take 3-4 tablets twice a day for neck pain as needed    . metoprolol succinate (TOPROL-XL) 50 MG 24 hr tablet Take 1 tablet (50 mg total) by mouth daily. Take with or immediately following a meal. 90 tablet 3  . Nutritional Supplements (NUTRITIONAL SUPPLEMENT PO) Ginger and Turmeric. Take 1 capsule by mouth daily.    . Potassium 99 MG TABS Take 1 tablet by mouth daily. Take one tablet once a day for cramps    . ramipril (ALTACE) 10 MG capsule Take 1 capsule (10 mg total) by mouth daily. 90 capsule 1  . RESVERATROL 100 MG CAPS Take 1 capsule by mouth daily. Take one tablet once a day     No current facility-administered medications for this visit.     Review of Systems : See HPI for pertinent positives and negatives.  Physical Examination  Vitals:   08/15/17 1432 08/15/17 1435  BP: 130/71 (!) 141/73  Pulse: 70   Resp: 18   Temp: 98.5 F (36.9 C)   TempSrc: Oral   SpO2: 94%   Weight: 215 lb 1.6 oz (97.6 kg)   Height: 5\' 9"  (1.753 m)    Body mass index is 31.76 kg/m.  General: WDWN obese male in NAD GAIT:normal HENT:  rhinophyma   Eyes: PERRLA Pulmonary: Respirations are non-labored, CTAB Cardiac: regular rhythm,no  detected murmur.  VASCULAR EXAM Carotid Bruits Right Left   Negative Negative    Abdominal aortic pulse is not palpable. Radial pulses are 2+ palpable and equal.      LE Pulses Right Left   POPLITEAL not palpable  not palpable   POSTERIOR TIBIAL  palpable   palpable    DORSALIS PEDIS  ANTERIOR TIBIAL palpable  palpable    Gastrointestinal: soft, nontender, BS WNL, no r/g,  no palpable masses. Musculoskeletal: no muscle atrophy/wasting. M/S 5/5 throughout, extremities without ischemic changes. Neurologic: A&O X 3;  speech is normal, CN 2-12 intact, pain and light touch intact in extremities, Motor exam as listed above Skin: No rashes, no ulcers, no cellulitis.   Psychiatric: Normal thought content, mood appropriate to clinical situation.    Assessment: Alex [name] is a 84 y.o. male who is s/p left carotid endarterectomy with bovine pericardial patch angioplasty on 06/14/2014. He has no history of stroke or TIA.  His atherosclerotic risk factors include history of smoking 2 ppd x 25 years (quit in the 1980's), borderline DM, CAD, and obesity.  DATA Carotid Duplex (08/15/17): 1-39% right ICA stenosis and  Widely patent left ICA (CEA site) with no evidence of restenosis. Bilateral vertebral artery flow is antegrade.  Bilateral subclavian artery waveforms are normal.  No significant change compared to exams on 07-07-15 and 08-11-16.   Plan:  Follow-up in 1year with Carotid Duplex scan.  I discussed in depth with the patient the nature of atherosclerosis, and emphasized the importance of maximal medical management including strict control of blood pressure, blood glucose, and lipid levels, obtaining regular exercise, and continued cessation of smoking.  The patient is aware that without maximal medical  management the underlying atherosclerotic disease process will progress, limiting the benefit of any interventions. The patient was given information about stroke prevention and what symptoms should prompt the patient to seek immediate medical care. Thank you for allowing [place] to participate in this patient's care.  Clemon Chambers, RN, MSN, FNP-C [facility] Office: [phone]  Clinic Physician: Trula Slade  08/15/17 2:49 PM

## 2017-08-15 NOTE — Telephone Encounter (Signed)
Pt is tapering off the doxepin.  Ok to fill at 25mg  for 30 day supply only.

## 2017-08-15 NOTE — Telephone Encounter (Signed)
Patient had do doxepin 25 mg and 50 mg on medication list. I removed the 50 mg but want to make sure this is correct before refilling Rx.

## 2017-08-15 NOTE — Patient Instructions (Signed)

## 2017-08-19 ENCOUNTER — Other Ambulatory Visit: Payer: Self-pay | Admitting: Internal Medicine

## 2017-08-22 NOTE — Telephone Encounter (Signed)
rx stopped 08/15/17 by vascular surgery

## 2017-08-23 ENCOUNTER — Other Ambulatory Visit: Payer: Self-pay | Admitting: *Deleted

## 2017-08-23 MED ORDER — DOXEPIN HCL 50 MG PO CAPS: 50.0000 mg | ORAL_CAPSULE | Freq: Every evening | ORAL | 3 refills | Status: DC | PRN

## 2017-08-23 NOTE — Telephone Encounter (Signed)
Received fax from [facility] stating patient wants Doxepin 50mg  instead of 25mg . Medication is not in Current medication list. Is this ok to add back with 50mg . Please Advise.

## 2017-08-23 NOTE — Telephone Encounter (Signed)
Looking back, the last Rx I gave him was for doxepin 50mg  po qhs prn insomnia.  We received a prior auth request in December oddly for the 25mg  instead of 50mg  (never knew for sure why that dose was different) which was completed and I guess he then got 25mg .  He had been wanting to see if he could gradually reduce his dose based on our last visit.  Please confirm with him which dose he's currently taking and which dose he wants to take?

## 2017-08-23 NOTE — Telephone Encounter (Signed)
Called and spoke with patient and he wants the 50mg . Medication list updated and Rx sent to pharmacy.

## 2017-08-30 ENCOUNTER — Telehealth: Payer: Self-pay | Admitting: Internal Medicine

## 2017-08-30 NOTE — Telephone Encounter (Signed)
Left message asking the pt to call me at [phone] to schedule AWV-S. VDM (DD)

## 2017-09-09 ENCOUNTER — Ambulatory Visit (INDEPENDENT_AMBULATORY_CARE_PROVIDER_SITE_OTHER): Payer: Medicare HMO

## 2017-09-09 VITALS — BP 140/65 | HR 79 | Temp 98.9°F | Ht 69.0 in | Wt 216.0 lb

## 2017-09-09 DIAGNOSIS — Z0000 Encounter for general adult medical examination without abnormal findings: Secondary | ICD-10-CM | POA: Diagnosis not present

## 2017-09-09 MED ORDER — ZOSTER VAC RECOMB ADJUVANTED 50 MCG/0.5ML IM SUSR: 0.5000 mL | Freq: Once | INTRAMUSCULAR | 1 refills | Status: AC

## 2017-09-09 MED ORDER — TETANUS-DIPHTH-ACELL PERTUSSIS 5-2.5-18.5 LF-MCG/0.5 IM SUSP: 0.5000 mL | Freq: Once | INTRAMUSCULAR | 0 refills | Status: AC

## 2017-09-09 NOTE — Patient Instructions (Addendum)
[name] , Thank you for taking time to come for your Medicare Wellness Visit. I appreciate your ongoing commitment to your health goals. Please review the following plan we discussed and let me know if I can assist you in the future.   Screening recommendations/referrals: Colonoscopy excluded, you are over age 82 Recommended yearly ophthalmology/optometry visit for glaucoma screening and checkup Recommended yearly dental visit for hygiene and checkup  Vaccinations: Influenza vaccine up to date, due 2019 fall season Pneumococcal vaccine up to date, completed Tdap vaccine due, ordered to pharmacy Shingles vaccine due, ordered to pharmacy    Advanced directives: Please send [place] a copy of your living will and health care power of attorney  Conditions/risks identified: none  Next appointment: Dr. Mariea Clonts 01/19/2018 @ 1pm                       Alex Dense, RN 09/10/2017 @ 12:45pm  Preventive Care 41 Years and Older, Male Preventive care refers to lifestyle choices and visits with your health care provider that can promote health and wellness. What does preventive care include?  A yearly physical exam. This is also called an annual well check.  Dental exams once or twice a year.  Routine eye exams. Ask your health care provider how often you should have your eyes checked.  Personal lifestyle choices, including:  Daily care of your teeth and gums.  Regular physical activity.  Eating a healthy diet.  Avoiding tobacco and drug use.  Limiting alcohol use.  Practicing safe sex.  Taking low doses of aspirin every day.  Taking vitamin and mineral supplements as recommended by your health care provider. What happens during an annual well check? The services and screenings done by your health care provider during your annual well check will depend on your age, overall health, lifestyle risk factors, and family history of disease. Counseling  Your health care provider may ask you  questions about your:  Alcohol use.  Tobacco use.  Drug use.  Emotional well-being.  Home and relationship well-being.  Sexual activity.  Eating habits.  History of falls.  Memory and ability to understand (cognition).  Work and work Statistician. Screening  You may have the following tests or measurements:  Height, weight, and BMI.  Blood pressure.  Lipid and cholesterol levels. These may be checked every 5 years, or more frequently if you are over 30 years old.  Skin check.  Lung cancer screening. You may have this screening every year starting at age 37 if you have a 30-pack-year history of smoking and currently smoke or have quit within the past 15 years.  Fecal occult blood test (FOBT) of the stool. You may have this test every year starting at age 79.  Flexible sigmoidoscopy or colonoscopy. You may have a sigmoidoscopy every 5 years or a colonoscopy every 10 years starting at age 17.  Prostate cancer screening. Recommendations will vary depending on your family history and other risks.  Hepatitis C blood test.  Hepatitis B blood test.  Sexually transmitted disease (STD) testing.  Diabetes screening. This is done by checking your blood sugar (glucose) after you have not eaten for a while (fasting). You may have this done every 1-3 years.  Abdominal aortic aneurysm (AAA) screening. You may need this if you are a current or former smoker.  Osteoporosis. You may be screened starting at age 79 if you are at high risk. Talk with your health care provider about your test results,  treatment options, and if necessary, the need for more tests. Vaccines  Your health care provider may recommend certain vaccines, such as:  Influenza vaccine. This is recommended every year.  Tetanus, diphtheria, and acellular pertussis (Tdap, Td) vaccine. You may need a Td booster every 10 years.  Zoster vaccine. You may need this after age 79.  Pneumococcal 13-valent conjugate  (PCV13) vaccine. One dose is recommended after age 81.  Pneumococcal polysaccharide (PPSV23) vaccine. One dose is recommended after age 38. Talk to your health care provider about which screenings and vaccines you need and how often you need them. This information is not intended to replace advice given to you by your health care provider. Make sure you discuss any questions you have with your health care provider. Document Released: 05/16/2015 Document Revised: 01/07/2016 Document Reviewed: 02/18/2015 Elsevier Interactive Patient Education  2017 [place] Prevention in the Home Falls can cause injuries. They can happen to people of all ages. There are many things you can do to make your home safe and to help prevent falls. What can I do on the outside of my home?  Regularly fix the edges of walkways and driveways and fix any cracks.  Remove anything that might make you trip as you walk through a door, such as a raised step or threshold.  Trim any bushes or trees on the path to your home.  Use bright outdoor lighting.  Clear any walking paths of anything that might make someone trip, such as rocks or tools.  Regularly check to see if handrails are loose or broken. Make sure that both sides of any steps have handrails.  Any raised decks and porches should have guardrails on the edges.  Have any leaves, snow, or ice cleared regularly.  Use sand or salt on walking paths during winter.  Clean up any spills in your garage right away. This includes oil or grease spills. What can I do in the bathroom?  Use night lights.  Install grab bars by the toilet and in the tub and shower. Do not use towel bars as grab bars.  Use non-skid mats or decals in the tub or shower.  If you need to sit down in the shower, use a plastic, non-slip stool.  Keep the floor dry. Clean up any water that spills on the floor as soon as it happens.  Remove soap buildup in the tub or shower  regularly.  Attach bath mats securely with double-sided non-slip rug tape.  Do not have throw rugs and other things on the floor that can make you trip. What can I do in the bedroom?  Use night lights.  Make sure that you have a light by your bed that is easy to reach.  Do not use any sheets or blankets that are too big for your bed. They should not hang down onto the floor.  Have a firm chair that has side arms. You can use this for support while you get dressed.  Do not have throw rugs and other things on the floor that can make you trip. What can I do in the kitchen?  Clean up any spills right away.  Avoid walking on wet floors.  Keep items that you use a lot in easy-to-reach places.  If you need to reach something above you, use a strong step stool that has a grab bar.  Keep electrical cords out of the way.  Do not use floor polish or wax that makes floors  slippery. If you must use wax, use non-skid floor wax.  Do not have throw rugs and other things on the floor that can make you trip. What can I do with my stairs?  Do not leave any items on the stairs.  Make sure that there are handrails on both sides of the stairs and use them. Fix handrails that are broken or loose. Make sure that handrails are as long as the stairways.  Check any carpeting to make sure that it is firmly attached to the stairs. Fix any carpet that is loose or worn.  Avoid having throw rugs at the top or bottom of the stairs. If you do have throw rugs, attach them to the floor with carpet tape.  Make sure that you have a light switch at the top of the stairs and the bottom of the stairs. If you do not have them, ask someone to add them for you. What else can I do to help prevent falls?  Wear shoes that:  Do not have high heels.  Have rubber bottoms.  Are comfortable and fit you well.  Are closed at the toe. Do not wear sandals.  If you use a stepladder:  Make sure that it is fully  opened. Do not climb a closed stepladder.  Make sure that both sides of the stepladder are locked into place.  Ask someone to hold it for you, if possible.  Clearly mark and make sure that you can see:  Any grab bars or handrails.  First and last steps.  Where the edge of each step is.  Use tools that help you move around (mobility aids) if they are needed. These include:  Canes.  Walkers.  Scooters.  Crutches.  Turn on the lights when you go into a dark area. Replace any light bulbs as soon as they burn out.  Set up your furniture so you have a clear path. Avoid moving your furniture around.  If any of your floors are uneven, fix them.  If there are any pets around you, be aware of where they are.  Review your medicines with your doctor. Some medicines can make you feel dizzy. This can increase your chance of falling. Ask your doctor what other things that you can do to help prevent falls. This information is not intended to replace advice given to you by your health care provider. Make sure you discuss any questions you have with your health care provider. Document Released: 02/13/2009 Document Revised: 09/25/2015 Document Reviewed: 05/24/2014 Elsevier Interactive Patient Education  2017 [employer].

## 2017-09-09 NOTE — Progress Notes (Signed)
Subjective:   CLAYBORN MILNES is a 84 y.o. male who presents for Medicare Annual/Subsequent preventive examination.  Last AWV-08/30/2016    Objective:    Vitals: BP 140/65 (BP Location: Left Arm, Patient Position: Sitting)   Pulse 79   Temp 98.9 F (37.2 C) (Oral)   Ht 5\' 9"  (1.753 m)   Wt 216 lb (98 kg)   SpO2 95%   BMI 31.90 kg/m   Body mass index is 31.9 kg/m.  Advanced Directives 09/09/2017 01/10/2017 09/01/2016 08/30/2016 05/30/2016 05/25/2016 03/03/2016  Does Patient Have a Medical Advance Directive? Yes Yes Yes Yes Yes Yes Yes  Type of Paramedic of [place];Living will [place];Living will [place];Living will [place];Living will Healthcare Power of [place];Living will -  Does patient want to make changes to medical advance directive? No - Patient declined - - No - Patient declined - - -  Copy of [place] in Chart? No - copy requested No - copy requested No - copy requested No - copy requested - No - copy requested No - copy requested    Tobacco Social History   Tobacco Use  Smoking Status Former Smoker  . Packs/day: 2.00  . Years: 25.00  . Pack years: 50.00  . Types: Cigarettes  . Last attempt to quit: 05/03/1973  . Years since quitting: 44.3  Smokeless Tobacco Never Used     Counseling given: Not Answered   Clinical Intake:  Pre-visit preparation completed: No  Pain : No/denies pain     Nutritional Risks: None Diabetes: No  How often do you need to have someone help you when you read instructions, pamphlets, or other written materials from your doctor or pharmacy?: 1 - Never What is the last grade level you completed in school?: College  Interpreter Needed?: No  Information entered by :: Tyson Dense, RN  Past Medical History:  Diagnosis Date  . Acquired deformity of nose   . Calculus of kidney   . Carotid artery disease  ([place])   . Cellulitis and abscess of unspecified site   . Cervicalgia   . CHF (congestive heart failure) ([place])   . Coronary atherosclerosis of unspecified type of vessel, native or graft   . Dizziness and giddiness   . Encounter for long-term (current) use of other medications   . Esophagitis, unspecified   . Hyperlipidemia   . Hypertrophy of prostate with urinary obstruction and other lower urinary tract symptoms (LUTS)   . Insomnia, unspecified   . Internal hemorrhoids without mention of complication   . Obesity, unspecified   . Old myocardial infarction   . Other and unspecified hyperlipidemia   . Other premature beats   . Palpitations   . 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 Shortness of breath   . Slowing of urinary stream   . Special screening for malignant neoplasm of prostate   . Unspecified essential hypertension   . 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 Unspecified vitamin D deficiency   . Urinary frequency    Past Surgical History:  Procedure Laterality Date  . CABG X 4  2000  . CARDIAC CATHETERIZATION    . CAROTID ENDARTERECTOMY Left 06-14-14   CE  . COLONOSCOPY  2007   Dr Lajoyce Corners, normal  . CORONARY ARTERY BYPASS GRAFT  2000  . ENDARTERECTOMY Left 06/14/2014   Procedure: LEFT CAROTID ENDARTERECTOMY WITH VASCUGUARD PATCH ANGIOPLASTY;  Surgeon: Serafina Mitchell, MD;  Location: [facility];  Service: Vascular;  Laterality: Left;  . ESOPHAGOGASTRODUODENOSCOPY ENDOSCOPY  2007   Dr Lajoyce Corners, with biopsy  . EXCISIONAL HEMORRHOIDECTOMY  1981  . LESION FROM FOREHEAD,(R) EAR (L) ARM     DR Almira Coaster  . LITHOTRIPSY  2005   Dr Almira Coaster  . TONSILLECTOMY  1940  . trench in right side of jaw after abcesses  06/01/2016   Family History  Problem Relation Age of Onset  . Cancer Mother        LIVER  . Cancer Brother        PROSTATE  . Cancer Daughter    Social History   Socioeconomic  History  . Marital status: Widowed    Spouse name: Not on file  . Number of children: Not on file  . Years of education: Not on file  . Highest education level: Not on file  Occupational History  . Not on file  Social Needs  . Financial resource strain: Not hard at all  . Food insecurity:    Worry: Never true    Inability: Never true  . Transportation needs:    Medical: No    Non-medical: No  Tobacco Use  . Smoking status: Former Smoker    Packs/day: 2.00    Years: 25.00    Pack years: 50.00    Types: Cigarettes    Last attempt to quit: 05/03/1973    Years since quitting: 44.3  . Smokeless tobacco: Never Used  Substance and Sexual Activity  . Alcohol use: Yes    Alcohol/week: 2.4 oz    Types: 1 Glasses of wine, 3 Cans of beer per week  . Drug use: No  . Sexual activity: Never  Lifestyle  . Physical activity:    Days per week: 7 days    Minutes per session: 20 min  . Stress: To some extent  Relationships  . Social connections:    Talks on phone: More than three times a week    Gets together: More than three times a week    Attends religious service: Never    Active member of club or organization: No    Attends meetings of clubs or organizations: Never    Relationship status: Widowed  Other Topics Concern  . Not on file  Social History Narrative  . Not on file    Outpatient Encounter Medications as of 09/09/2017  Medication Sig  . b complex vitamins tablet Take 1 tablet by mouth daily.  . cholecalciferol (VITAMIN D) 1000 UNITS tablet Take 1,000 Units by mouth daily.   . Cinnamon 500 MG TABS Take 1 tablet by mouth daily.  Marland Kitchen CITRUS BERGAMOT PO Take 1 capsule by mouth daily. Take one capsule once daily to lower cholesterol  . Coenzyme Q10 (CO Q 10) 100 MG CAPS Take 1 capsule by mouth daily.  . diazepam (VALIUM) 5 MG tablet TAKE 1/2 TABLET BY MOUTH TWICE A DAY  . doxepin (SINEQUAN) 50 MG capsule Take 1 capsule (50 mg total) by mouth at bedtime as needed. For insomnia  or anxiety  . fish oil-omega-3 fatty acids 1000 MG capsule Take 2 g by mouth daily. Take one tablet once a day for cholesterol  . Fluticasone-Salmeterol (ADVAIR DISKUS) 250-50 MCG/DOSE AEPB inhale 1 puff by mouth twice a day  . ibuprofen (ADVIL,MOTRIN) 200 MG tablet Take 200 mg by mouth  every 6 (six) hours as needed for pain. Take 3-4 tablets twice a day for neck pain as needed  . metoprolol succinate (TOPROL-XL) 50 MG 24 hr tablet Take 1 tablet (50 mg total) by mouth daily. Take with or immediately following a meal.  . Nutritional Supplements (NUTRITIONAL SUPPLEMENT PO) Ginger and Turmeric. Take 1 capsule by mouth daily.  . Potassium 99 MG TABS Take 1 tablet by mouth daily. Take one tablet once a day for cramps  . ramipril (ALTACE) 10 MG capsule Take 1 capsule (10 mg total) by mouth daily.  Marland Kitchen RESVERATROL 100 MG CAPS Take 1 capsule by mouth daily. Take one tablet once a day  . Tdap (BOOSTRIX) 5-2.5-18.5 LF-MCG/0.5 injection Inject 0.5 mLs into the muscle once for 1 dose.  Marland Kitchen Zoster Vaccine Adjuvanted [facility]) injection Inject 0.5 mLs into the muscle once for 1 dose.  . [DISCONTINUED] Tdap (BOOSTRIX) 5-2.5-18.5 LF-MCG/0.5 injection Inject 0.5 mLs into the muscle once.  . [DISCONTINUED] Zoster Vaccine Adjuvanted [facility]) injection Inject 0.5 mLs into the muscle once.  . [DISCONTINUED] aspirin 81 MG tablet Take 81 mg by mouth daily. Take one tablet once a day   No facility-administered encounter medications on file as of 09/09/2017.     Activities of Daily Living In your present state of health, do you have any difficulty performing the following activities: 09/09/2017  Hearing? Y  Vision? Y  Difficulty concentrating or making decisions? Y  Walking or climbing stairs? N  Dressing or bathing? N  Doing errands, shopping? N  Preparing Food and eating ? N  Using the Toilet? N  In the past six months, have you accidently leaked urine? N  Do you have problems with loss of bowel control? N    Managing your Medications? N  Managing your Finances? N  Housekeeping or managing your Housekeeping? N  Some recent data might be hidden    Patient Care Team: Gayland Curry, DO as PCP - General (Geriatric Medicine) Lorretta Harp, MD as Consulting Physician (Cardiology) Serafina Mitchell, MD as Consulting Physician (Vascular Surgery) Shon Hough, MD as Consulting Physician (Ophthalmology) Bjorn Loser, MD as Consulting Physician (Urology) Shon Hough, MD as Consulting Physician (Ophthalmology)   Assessment:   This is a routine wellness examination for Giann.  Exercise Activities and Dietary recommendations Current Exercise Habits: Home exercise routine, Type of exercise: walking, Time (Minutes): 20, Frequency (Times/Week): 7, Weekly Exercise (Minutes/Week): 140, Intensity: Mild, Exercise limited by: None identified  Goals    None      Fall Risk Fall Risk  09/09/2017 07/14/2017 01/10/2017 08/30/2016 05/25/2016  Falls in the past year? No No No No No   Is the patient's home free of loose throw rugs in walkways, pet beds, electrical cords, etc?   yes      Grab bars in the bathroom? no      Handrails on the stairs?   yes      Adequate lighting?   yes  Depression Screen PHQ 2/9 Scores 09/09/2017 08/30/2016 08/27/2015 10/29/2014  PHQ - 2 Score 1 1 0 0    Cognitive Function MMSE - Mini Mental State Exam 09/09/2017 08/30/2016 08/27/2015  Orientation to time 5 5 5   Orientation to Place 5 5 5   Registration 3 3 3   Attention/ Calculation 5 5 5   Recall 2 0 3  Language- name 2 objects 2 2 2   Language- repeat 1 1 1   Language- follow 3 step command 3 3 3   Language- read & follow  direction 1 1 1   Write a sentence 1 1 1   Copy design 1 0 1  Total score 29 26 30         Immunization History  Administered Date(s) Administered  . Influenza Whole 02/06/2013  . Influenza, High Dose Seasonal PF 12/09/2015, 01/10/2017  . Influenza-Unspecified 01/31/2009, 01/08/2014,  12/13/2014  . Pneumococcal Conjugate-13 11/08/2013  . Pneumococcal-Unspecified 05/03/2004  . Td 05/03/2004  . Zoster 07/19/2012    Qualifies for Shingles Vaccine? Yes, educated and ordered to pharmacy  Screening Tests Health Maintenance  Topic Date Due  . Samul Dada  05/03/2014  . INFLUENZA VACCINE  12/01/2017  . PNA vac Low Risk Adult  Completed   Cancer Screenings: Lung: Low Dose CT Chest recommended if Age 23-80 years, 30 pack-year currently smoking OR have quit w/in 15years. Patient does qualify. Colorectal: up to date  Additional Screenings:  Hepatitis C Screening: declined  TDAP due-ordered    Plan:    I have personally reviewed and addressed the Medicare Annual Wellness questionnaire and have noted the following in the patient's chart:  A. Medical and social history B. Use of alcohol, tobacco or illicit drugs  C. Current medications and supplements D. Functional ability and status E.  Nutritional status F.  Physical activity G. Advance directives H. List of other physicians I.  Hospitalizations, surgeries, and ER visits in previous 12 months J.  [place] to include hearing, vision, cognitive, depression L. Referrals and appointments - none  In addition, I have reviewed and discussed with patient certain preventive protocols, quality metrics, and best practice recommendations. A written personalized care plan for preventive services as well as general preventive health recommendations were provided to patient.  See attached scanned questionnaire for additional information.   Signed,   Tyson Dense, RN Nurse Health Advisor  Patient Concerns: Feeling groggy in the mornings

## 2017-09-15 ENCOUNTER — Other Ambulatory Visit: Payer: Self-pay | Admitting: Internal Medicine

## 2017-09-22 ENCOUNTER — Other Ambulatory Visit: Payer: Self-pay | Admitting: Internal Medicine

## 2017-09-22 DIAGNOSIS — G4700 Insomnia, unspecified: Secondary | ICD-10-CM

## 2017-10-13 ENCOUNTER — Other Ambulatory Visit: Payer: Self-pay | Admitting: Internal Medicine

## 2017-10-24 ENCOUNTER — Other Ambulatory Visit: Payer: Self-pay | Admitting: Internal Medicine

## 2017-10-26 ENCOUNTER — Telehealth: Payer: Self-pay | Admitting: *Deleted

## 2017-10-26 NOTE — Telephone Encounter (Signed)
Received fax from [facility] [phone] for Prior Authorization for Doxepin.  [id number]  Placed in Dr. Cyndi Lennert folder to review, fill out and sign. To be faxed back to [place]

## 2017-11-02 NOTE — Telephone Encounter (Signed)
PA denied. Paperwork in Dr. Magdalene Molly IN box for review.

## 2017-11-23 NOTE — Telephone Encounter (Signed)
Received fax from [facility] regarding an appeal for patient's Doxepin and it stated "after a careful review of the information we determined we were right to DENY coverage of Doxepin.  Fax placed in Dr. Cyndi Lennert folder to review.

## 2017-11-25 NOTE — Telephone Encounter (Signed)
Spoke with patient and advised results, he understood.

## 2017-12-14 ENCOUNTER — Other Ambulatory Visit: Payer: Self-pay | Admitting: Internal Medicine

## 2017-12-14 NOTE — Telephone Encounter (Signed)
[place] Database verified and compliance confirmed   

## 2017-12-20 ENCOUNTER — Other Ambulatory Visit: Payer: Self-pay | Admitting: Internal Medicine

## 2017-12-28 ENCOUNTER — Other Ambulatory Visit: Payer: Self-pay | Admitting: Internal Medicine

## 2017-12-30 ENCOUNTER — Telehealth: Payer: Self-pay | Admitting: *Deleted

## 2017-12-30 MED ORDER — DOXEPIN HCL 50 MG PO CAPS: 50.0000 mg | ORAL_CAPSULE | Freq: Every evening | ORAL | 0 refills | Status: DC | PRN

## 2017-12-30 NOTE — Telephone Encounter (Signed)
Patient calling in asking that his medication list be updated, pt taking doxepin 50 mg and not 25 mg. Pt has been on 50 mg since coming off the 75 mg dose. Also see phone note dated 08/23/2017 Dr. Mariea Clonts stated pt on 50mg  . rx sent to pharmacy by e-script

## 2018-01-10 ENCOUNTER — Other Ambulatory Visit: Payer: Self-pay | Admitting: Internal Medicine

## 2018-01-16 ENCOUNTER — Other Ambulatory Visit: Payer: Medicare HMO

## 2018-01-17 ENCOUNTER — Other Ambulatory Visit: Payer: Medicare HMO

## 2018-01-17 DIAGNOSIS — I10 Essential (primary) hypertension: Secondary | ICD-10-CM | POA: Diagnosis not present

## 2018-01-17 DIAGNOSIS — Z6830 Body mass index (BMI) 30.0-30.9, adult: Secondary | ICD-10-CM | POA: Diagnosis not present

## 2018-01-17 DIAGNOSIS — E6609 Other obesity due to excess calories: Secondary | ICD-10-CM | POA: Diagnosis not present

## 2018-01-17 DIAGNOSIS — E785 Hyperlipidemia, unspecified: Secondary | ICD-10-CM | POA: Diagnosis not present

## 2018-01-18 LAB — CBC WITH DIFFERENTIAL/PLATELET
Basophils Absolute: 66 cells/uL (ref 0–200)
Basophils Relative: 0.8 %
Eosinophils Absolute: 312 cells/uL (ref 15–500)
Eosinophils Relative: 3.8 %
HCT: 41.6 % (ref 38.5–50.0)
Hemoglobin: 14.3 g/dL (ref 13.2–17.1)
Lymphs Abs: 2394 cells/uL (ref 850–3900)
MCH: 33 pg (ref 27.0–33.0)
MCHC: 34.4 g/dL (ref 32.0–36.0)
MCV: 96.1 fL (ref 80.0–100.0)
MPV: 9.9 fL (ref 7.5–12.5)
Monocytes Relative: 7.9 %
Neutro Abs: 4781 cells/uL (ref 1500–7800)
Neutrophils Relative %: 58.3 %
Platelets: 230 10*3/uL (ref 140–400)
RBC: 4.33 10*6/uL (ref 4.20–5.80)
RDW: 12.5 % (ref 11.0–15.0)
Total Lymphocyte: 29.2 %
WBC mixed population: 648 cells/uL (ref 200–950)
WBC: 8.2 10*3/uL (ref 3.8–10.8)

## 2018-01-18 LAB — COMPLETE METABOLIC PANEL WITH GFR
AG Ratio: 1.7 (calc) (ref 1.0–2.5)
ALT: 14 U/L (ref 9–46)
AST: 17 U/L (ref 10–35)
Albumin: 4.2 g/dL (ref 3.6–5.1)
Alkaline phosphatase (APISO): 38 U/L — ABNORMAL LOW (ref 40–115)
BUN: 15 mg/dL (ref 7–25)
CO2: 28 mmol/L (ref 20–32)
Calcium: 9 mg/dL (ref 8.6–10.3)
Chloride: 104 mmol/L (ref 98–110)
Creat: 1.07 mg/dL (ref 0.70–1.11)
GFR, Est African American: 73 mL/min/{1.73_m2} (ref >=60)
GFR, Est Non African American: 63 mL/min/{1.73_m2} (ref >=60)
Globulin: 2.5 g/dL (calc) (ref 1.9–3.7)
Glucose, Bld: 94 mg/dL (ref 65–99)
Potassium: 4.6 mmol/L (ref 3.5–5.3)
Sodium: 140 mmol/L (ref 135–146)
Total Bilirubin: 0.6 mg/dL (ref 0.2–1.2)
Total Protein: 6.7 g/dL (ref 6.1–8.1)

## 2018-01-18 LAB — HEMOGLOBIN A1C
Hgb A1c MFr Bld: 5.8 % of total Hgb — ABNORMAL HIGH (ref <5.7)
Mean Plasma Glucose: 120 (calc)
eAG (mmol/L): 6.6 (calc)

## 2018-01-18 LAB — LIPID PANEL
Cholesterol: 199 mg/dL (ref <200)
HDL: 47 mg/dL (ref >40)
LDL Cholesterol (Calc): 133 mg/dL (calc) — ABNORMAL HIGH
Non-HDL Cholesterol (Calc): 152 mg/dL (calc) — ABNORMAL HIGH (ref <130)
Total CHOL/HDL Ratio: 4.2 (calc) (ref <5.0)
Triglycerides: 90 mg/dL (ref <150)

## 2018-01-19 ENCOUNTER — Ambulatory Visit (INDEPENDENT_AMBULATORY_CARE_PROVIDER_SITE_OTHER): Payer: Medicare HMO | Admitting: Internal Medicine

## 2018-01-19 ENCOUNTER — Encounter: Payer: Self-pay | Admitting: Internal Medicine

## 2018-01-19 VITALS — BP 160/80 | HR 64 | Temp 98.2°F | Wt 220.0 lb

## 2018-01-19 DIAGNOSIS — E785 Hyperlipidemia, unspecified: Secondary | ICD-10-CM | POA: Diagnosis not present

## 2018-01-19 DIAGNOSIS — E6609 Other obesity due to excess calories: Secondary | ICD-10-CM

## 2018-01-19 DIAGNOSIS — I10 Essential (primary) hypertension: Secondary | ICD-10-CM | POA: Diagnosis not present

## 2018-01-19 DIAGNOSIS — R7303 Prediabetes: Secondary | ICD-10-CM | POA: Diagnosis not present

## 2018-01-19 DIAGNOSIS — Z6830 Body mass index (BMI) 30.0-30.9, adult: Secondary | ICD-10-CM | POA: Diagnosis not present

## 2018-01-19 DIAGNOSIS — Z23 Encounter for immunization: Secondary | ICD-10-CM

## 2018-01-19 DIAGNOSIS — Z6832 Body mass index (BMI) 32.0-32.9, adult: Secondary | ICD-10-CM

## 2018-01-19 DIAGNOSIS — E66811 Other obesity due to excess calories: Secondary | ICD-10-CM

## 2018-01-19 MED ORDER — CHLORTHALIDONE 25 MG PO TABS: 25.0000 mg | ORAL_TABLET | Freq: Every day | ORAL | 0 refills | Status: DC

## 2018-01-19 MED ORDER — RAMIPRIL 10 MG PO CAPS: 10.0000 mg | ORAL_CAPSULE | Freq: Every day | ORAL | 3 refills | Status: DC

## 2018-01-19 NOTE — Progress Notes (Signed)
Location:  [facility] clinic Provider:  Emary Zalar L. Mariea Clonts, D.O., C.M.D.  Goals of Care:  Advanced Directives 09/09/2017  Does Patient Have a Medical Advance Directive? Yes  Type of Paramedic of [place];Living will  Does patient want to make changes to medical advance directive? No - Patient declined  Copy of [place] in Chart? No - copy requested  Pt still has not brought his advance directives.   Chief Complaint  Patient presents with  . Medical Management of Chronic Issues    28mth follow-up    HPI: Patient is a 84 y.o. male seen today for medical management of chronic diseases.    BP is up at 160/80 today.  Takes his medication at night.  He's on ramipril 10mg  daily--had been on since his heart attack.  Also on toprol xl 50mg .  He had been on amlodipine but it caused ankle swelling.  Has not been on diuretics.    Weight is also up 4 lbs from May.  BMI 32.  He is walking in the [employer] 3000 to 56 steps--his brother in law got him walking.    He accepted his flu shot today.  He wants a date, to lose his belly and he wants his life extended 40 years and wants to feel like he's 84 yo.  Kidney function stable.  He's not using much ibuprofen.  He's using turmeric and black pepper instead for arthritis pain--not in much pain--knees are weak and he has to rock or use his arms to get up.       Office Visit from 01/19/2018 in [facility]  Alcohol Use Disorder Identification Test Final Score (AUDIT)  4     hba1c has trended up slightly.  In prediabetic range.   LDL has come down slightly from last check 6 mos ago.  Past Medical History:  Diagnosis Date  . Acquired deformity of nose   . Calculus of kidney   . Carotid artery disease ([place])   . Cellulitis and abscess of unspecified site   . Cervicalgia   . CHF (congestive heart failure) ([place])   . Coronary atherosclerosis of unspecified type of vessel, native or graft   .  Dizziness and giddiness   . Encounter for long-term (current) use of other medications   . Esophagitis, unspecified   . Hyperlipidemia   . Hypertrophy of prostate with urinary obstruction and other lower urinary tract symptoms (LUTS)   . Insomnia, unspecified   . Internal hemorrhoids without mention of complication   . Obesity, unspecified   . Old myocardial infarction   . Other and unspecified hyperlipidemia   . Other premature beats   . Palpitations   . 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 Shortness of breath   . Slowing of urinary stream   . Special screening for malignant neoplasm of prostate   . Unspecified essential hypertension   . 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 Unspecified vitamin D deficiency   . Urinary frequency     Past Surgical History:  Procedure Laterality Date  . CABG X 4  2000  . CARDIAC CATHETERIZATION    . CAROTID ENDARTERECTOMY Left 06-14-14   CE  . COLONOSCOPY  2007   Dr Lajoyce Corners, normal  . CORONARY ARTERY BYPASS GRAFT     2000  . ENDARTERECTOMY Left 06/14/2014   Procedure: LEFT CAROTID ENDARTERECTOMY  WITH VASCUGUARD PATCH ANGIOPLASTY;  Surgeon: Serafina Mitchell, MD;  Location: [facility];  Service: Vascular;  Laterality: Left;  . ESOPHAGOGASTRODUODENOSCOPY ENDOSCOPY  2007   Dr Lajoyce Corners, with biopsy  . EXCISIONAL HEMORRHOIDECTOMY  1981  . LESION FROM FOREHEAD,(R) EAR (L) ARM     DR Almira Coaster  . LITHOTRIPSY  2005   Dr Almira Coaster  . TONSILLECTOMY  1940  . trench in right side of jaw after abcesses  06/01/2016    No Known Allergies  Outpatient Encounter Medications as of 01/19/2018  Medication Sig  . b complex vitamins tablet Take 1 tablet by mouth daily.  . cholecalciferol (VITAMIN D) 1000 UNITS tablet Take 1,000 Units by mouth daily.   . Cinnamon 500 MG TABS Take 1 tablet by mouth daily.  Marland Kitchen CITRUS BERGAMOT PO Take 1 capsule by mouth daily. Take one capsule once daily  to lower cholesterol  . Coenzyme Q10 (CO Q 10) 100 MG CAPS Take 1 capsule by mouth daily.  . diazepam (VALIUM) 5 MG tablet TAKE 1/2 TABLET BY MOUTH TWICE DAILY  . doxepin (SINEQUAN) 50 MG capsule Take 1 capsule (50 mg total) by mouth at bedtime as needed (insomnia or anxiety).  . fish oil-omega-3 fatty acids 1000 MG capsule Take 2 g by mouth daily. Take one tablet once a day for cholesterol  . Fluticasone-Salmeterol (ADVAIR DISKUS) 250-50 MCG/DOSE AEPB inhale 1 puff by mouth twice a day  . ibuprofen (ADVIL,MOTRIN) 200 MG tablet Take 200 mg by mouth every 6 (six) hours as needed for pain. Take 3-4 tablets twice a day for neck pain as needed  . metoprolol succinate (TOPROL-XL) 50 MG 24 hr tablet Take 1 tablet (50 mg total) by mouth daily. Take with or immediately following a meal.  . Nutritional Supplements (NUTRITIONAL SUPPLEMENT PO) Ginger and Turmeric. Take 1 capsule by mouth daily.  . Potassium 99 MG TABS Take 1 tablet by mouth daily. Take one tablet once a day for cramps  . ramipril (ALTACE) 10 MG capsule TAKE 1 CAPSULE BY MOUTH ONCE DAILY  . RESVERATROL 100 MG CAPS Take 1 capsule by mouth daily. Take one tablet once a day   No facility-administered encounter medications on file as of 01/19/2018.     Review of Systems:  Review of Systems  Constitutional: Negative for malaise/fatigue and weight loss.       Wt up 4 lbs  HENT: Positive for hearing loss. Negative for congestion.   Eyes:       Glasses  Respiratory: Negative for shortness of breath.   Cardiovascular: Negative for chest pain, palpitations and leg swelling.       BP up  Gastrointestinal: Negative for abdominal pain, blood in stool, constipation, diarrhea and melena.       Abdominal obesity  Genitourinary: Negative for dysuria.  Musculoskeletal: Positive for joint pain. Negative for falls.       Knee weakness  Neurological: Negative for dizziness.  Endo/Heme/Allergies: Does not bruise/bleed easily.  Psychiatric/Behavioral:  Negative for depression and memory loss. The patient is not nervous/anxious and does not have insomnia.     Health Maintenance  Topic Date Due  . TETANUS/TDAP  05/03/2014  . INFLUENZA VACCINE  12/01/2017  . PNA vac Low Risk Adult  Completed    Physical Exam: Vitals:   01/19/18 1308  BP: (!) 160/80  Pulse: 64  Temp: 98.2 F (36.8 C)  TempSrc: Oral  SpO2: 97%  Weight: 220 lb (99.8 kg)   Body mass index is 32.49 kg/m.  Physical Exam  Constitutional: He is oriented to person, place, and time. He appears well-developed and well-nourished. No distress.  HENT:  Head: Normocephalic and atraumatic.  HOH, talks loudly  Eyes:  glasses  Cardiovascular: Normal rate, regular rhythm, normal heart sounds and intact distal pulses.  Pulmonary/Chest: Effort normal and breath sounds normal. No respiratory distress. He has no wheezes. He has no rales.  Abdominal: Soft. Bowel sounds are normal. He exhibits no distension. There is no tenderness.  Musculoskeletal: Normal range of motion.  Does have to use his arms to get up out of a chair  Neurological: He is alert and oriented to person, place, and time.  Skin: Skin is warm and dry. Capillary refill takes less than 2 seconds.  Psychiatric: He has a normal mood and affect.    Labs reviewed: Basic Metabolic Panel: Recent Labs    07/06/17 0901 01/17/18 0934  NA 140 140  K 4.5 4.6  CL 104 104  CO2 29 28  GLUCOSE 96 94  BUN 17 15  CREATININE 1.07 1.07  CALCIUM 9.0 9.0   Liver Function Tests: Recent Labs    07/06/17 0901 01/17/18 0934  AST 17 17  ALT 12 14  BILITOT 0.4 0.6  PROT 6.8 6.7   No results for input(s): LIPASE, AMYLASE in the last 8760 hours. No results for input(s): AMMONIA in the last 8760 hours. CBC: Recent Labs    07/06/17 0901 01/17/18 0934  WBC 7.6 8.2  NEUTROABS 4,157 4,781  HGB 14.6 14.3  HCT 42.6 41.6  MCV 95.9 96.1  PLT 238 230   Lipid Panel: Recent Labs    07/06/17 0901 01/17/18 0934  CHOL  204* 199  HDL 47 47  LDLCALC 140* 133*  TRIG 74 90  CHOLHDL 4.3 4.2   Lab Results  Component Value Date   HGBA1C 5.8 (H) 01/17/2018    Assessment/Plan 1. Need for influenza vaccination - Flu vaccine HIGH DOSE PF (Fluzone High dose)  2. Essential hypertension - bp elevate, will add chlorthalidone to control bp to prevent recurrent MI or new stroke--he is agreeable, hopefully this will work--he's to call back if he has difficulty with incontinence or excessive urinary frequency from it, cont toprol and altace as is - ramipril (ALTACE) 10 MG capsule; Take 1 capsule (10 mg total) by mouth daily.  Dispense: 90 capsule; Refill: 3 - chlorthalidone (HYGROTON) 25 MG tablet; Take 1 tablet (25 mg total) by mouth daily.  Dispense: 30 tablet; Refill: 0  3. Hyperlipidemia, unspecified hyperlipidemia type -slight improvement in LDL but not at goal--needs to improve eating habits with low fat, low cholesterol diet and decreased starchy carbs  4. Class 1 obesity due to excess calories with serious comorbidity and body mass index (BMI) of 30.0 to 30.9 in adult Weight up instead of down from last visit, really needs to lose about 30 lbs -encouraged more walking and general activity, less sitting, cycling would help quad strength getting up out of chair  5. BMI 32.0-32.9,adult -reviewed where he stands with his weight and discussed risks and need for weight loss with improved diet and exercise regimen  6. Prediabetes -slight improvement since last time, but still in hyperglycemic range -again counseled on diet and exercise  Labs/tests ordered:   Orders Placed This Encounter  Procedures  . Flu vaccine HIGH DOSE PF (Fluzone High dose)   Next appt:  6 wks, bp check 03/06/18  Candis Kabel L. Delano Scardino, D.O. [facility] [phone]  [place], [place] [address] Cell Phone (Mon-Fri 8am-5pm):  [phone] On Call:  [phone] & follow prompts after 5pm &  weekends Office Phone:  [phone] Office Fax:  [phone]

## 2018-01-19 NOTE — Patient Instructions (Addendum)
Try to increase your steps up to 7500 per day. Cut down on your sweets and portions of starchy foods like pasta, rice, bread, chips/crackers, potatoes, fried foods.   DASH Eating Plan DASH stands for "Dietary Approaches to Stop Hypertension." The DASH eating plan is a healthy eating plan that has been shown to reduce high blood pressure (hypertension). It may also reduce your risk for type 2 diabetes, heart disease, and stroke. The DASH eating plan may also help with weight loss. What are tips for following this plan? General guidelines  Avoid eating more than 2,300 mg (milligrams) of salt (sodium) a day. If you have hypertension, you may need to reduce your sodium intake to 1,500 mg a day.  Limit alcohol intake to no more than 1 drink a day for nonpregnant women and 2 drinks a day for men. One drink equals 12 oz of beer, 5 oz of wine, or 1 oz of hard liquor.  Work with your health care provider to maintain a healthy body weight or to lose weight. Ask what an ideal weight is for you.  Get at least 30 minutes of exercise that causes your heart to beat faster (aerobic exercise) most days of the week. Activities may include walking, swimming, or biking.  Work with your health care provider or diet and nutrition specialist (dietitian) to adjust your eating plan to your individual calorie needs. Reading food labels  Check food labels for the amount of sodium per serving. Choose foods with less than 5 percent of the Daily Value of sodium. Generally, foods with less than 300 mg of sodium per serving fit into this eating plan.  To find whole grains, look for the word "whole" as the first word in the ingredient list. Shopping  Buy products labeled as "low-sodium" or "no salt added."  Buy fresh foods. Avoid canned foods and premade or frozen meals. Cooking  Avoid adding salt when cooking. Use salt-free seasonings or herbs instead of table salt or sea salt. Check with your health care provider or  pharmacist before using salt substitutes.  Do not fry foods. Cook foods using healthy methods such as baking, boiling, grilling, and broiling instead.  Cook with heart-healthy oils, such as olive, canola, soybean, or sunflower oil. Meal planning   Eat a balanced diet that includes: ? 5 or more servings of fruits and vegetables each day. At each meal, try to fill half of your plate with fruits and vegetables. ? Up to 6-8 servings of whole grains each day. ? Less than 6 oz of lean meat, poultry, or fish each day. A 3-oz serving of meat is about the same size as a deck of cards. One egg equals 1 oz. ? 2 servings of low-fat dairy each day. ? A serving of nuts, seeds, or beans 5 times each week. ? Heart-healthy fats. Healthy fats called Omega-3 fatty acids are found in foods such as flaxseeds and coldwater fish, like sardines, salmon, and mackerel.  Limit how much you eat of the following: ? Canned or prepackaged foods. ? Food that is high in trans fat, such as fried foods. ? Food that is high in saturated fat, such as fatty meat. ? Sweets, desserts, sugary drinks, and other foods with added sugar. ? Full-fat dairy products.  Do not salt foods before eating.  Try to eat at least 2 vegetarian meals each week.  Eat more home-cooked food and less restaurant, buffet, and fast food.  When eating at a restaurant, ask that your  food be prepared with less salt or no salt, if possible. What foods are recommended? The items listed may not be a complete list. Talk with your dietitian about what dietary choices are best for you. Grains Whole-grain or whole-wheat bread. Whole-grain or whole-wheat pasta. Brown rice. Modena Morrow. Bulgur. Whole-grain and low-sodium cereals. Pita bread. Low-fat, low-sodium crackers. Whole-wheat flour tortillas. Vegetables Fresh or frozen vegetables (raw, steamed, roasted, or grilled). Low-sodium or reduced-sodium tomato and vegetable juice. Low-sodium or  reduced-sodium tomato sauce and tomato paste. Low-sodium or reduced-sodium canned vegetables. Fruits All fresh, dried, or frozen fruit. Canned fruit in natural juice (without added sugar). Meat and other protein foods Skinless chicken or [place]. Ground chicken or [place]. Pork with fat trimmed off. Fish and seafood. Egg whites. Dried beans, peas, or lentils. Unsalted nuts, nut butters, and seeds. Unsalted canned beans. Lean cuts of beef with fat trimmed off. Low-sodium, lean deli meat. Dairy Low-fat (1%) or fat-free (skim) milk. Fat-free, low-fat, or reduced-fat cheeses. Nonfat, low-sodium ricotta or cottage cheese. Low-fat or nonfat yogurt. Low-fat, low-sodium cheese. Fats and oils Soft margarine without trans fats. Vegetable oil. Low-fat, reduced-fat, or light mayonnaise and salad dressings (reduced-sodium). Canola, safflower, olive, soybean, and sunflower oils. Avocado. Seasoning and other foods Herbs. Spices. Seasoning mixes without salt. Unsalted popcorn and pretzels. Fat-free sweets. What foods are not recommended? The items listed may not be a complete list. Talk with your dietitian about what dietary choices are best for you. Grains Baked goods made with fat, such as croissants, muffins, or some breads. Dry pasta or rice meal packs. Vegetables Creamed or fried vegetables. Vegetables in a cheese sauce. Regular canned vegetables (not low-sodium or reduced-sodium). Regular canned tomato sauce and paste (not low-sodium or reduced-sodium). Regular tomato and vegetable juice (not low-sodium or reduced-sodium). Angie Fava. Olives. Fruits Canned fruit in a light or heavy syrup. Fried fruit. Fruit in cream or butter sauce. Meat and other protein foods Fatty cuts of meat. Ribs. Fried meat. Berniece Salines. Sausage. Bologna and other processed lunch meats. Salami. Fatback. Hotdogs. Bratwurst. Salted nuts and seeds. Canned beans with added salt. Canned or smoked fish. Whole eggs or egg yolks. Chicken or [place]  with skin. Dairy Whole or 2% milk, cream, and half-and-half. Whole or full-fat cream cheese. Whole-fat or sweetened yogurt. Full-fat cheese. Nondairy creamers. Whipped toppings. Processed cheese and cheese spreads. Fats and oils Butter. Stick margarine. Lard. Shortening. Ghee. Bacon fat. Tropical oils, such as coconut, palm kernel, or palm oil. Seasoning and other foods Salted popcorn and pretzels. Onion salt, garlic salt, seasoned salt, table salt, and sea salt. Worcestershire sauce. Tartar sauce. Barbecue sauce. Teriyaki sauce. Soy sauce, including reduced-sodium. Steak sauce. Canned and packaged gravies. Fish sauce. Oyster sauce. Cocktail sauce. Horseradish that you find on the shelf. Ketchup. Mustard. Meat flavorings and tenderizers. Bouillon cubes. Hot sauce and Tabasco sauce. Premade or packaged marinades. Premade or packaged taco seasonings. Relishes. Regular salad dressings. Where to find more information:  National Heart, Lung, and [place]: https://wilson-eaton.com/  American Heart Association: www.heart.org Summary  The DASH eating plan is a healthy eating plan that has been shown to reduce high blood pressure (hypertension). It may also reduce your risk for type 2 diabetes, heart disease, and stroke.  With the DASH eating plan, you should limit salt (sodium) intake to 2,300 mg a day. If you have hypertension, you may need to reduce your sodium intake to 1,500 mg a day.  When on the DASH eating plan, aim to eat more fresh fruits and vegetables,  whole grains, lean proteins, low-fat dairy, and heart-healthy fats.  Work with your health care provider or diet and nutrition specialist (dietitian) to adjust your eating plan to your individual calorie needs. This information is not intended to replace advice given to you by your health care provider. Make sure you discuss any questions you have with your health care provider. Document Released: 04/08/2011 Document Revised: 04/12/2016  Document Reviewed: 04/12/2016 Elsevier Interactive Patient Education  [employer].

## 2018-01-26 ENCOUNTER — Other Ambulatory Visit: Payer: Self-pay | Admitting: Internal Medicine

## 2018-01-27 MED ORDER — DIAZEPAM 5 MG PO TABS: 2.5000 mg | ORAL_TABLET | Freq: Two times a day (BID) | ORAL | 0 refills | Status: DC

## 2018-01-27 NOTE — Addendum Note (Signed)
Addended by: Despina Hidden on: 01/27/2018 08:40 AM   Modules accepted: Orders

## 2018-03-06 ENCOUNTER — Ambulatory Visit: Payer: Medicare HMO | Admitting: Internal Medicine

## 2018-03-13 ENCOUNTER — Other Ambulatory Visit: Payer: Self-pay | Admitting: Internal Medicine

## 2018-04-08 ENCOUNTER — Other Ambulatory Visit: Payer: Self-pay | Admitting: Internal Medicine

## 2018-04-29 ENCOUNTER — Other Ambulatory Visit: Payer: Self-pay | Admitting: Internal Medicine

## 2018-06-20 ENCOUNTER — Other Ambulatory Visit: Payer: Self-pay | Admitting: Internal Medicine

## 2018-06-20 DIAGNOSIS — I10 Essential (primary) hypertension: Secondary | ICD-10-CM

## 2018-06-22 ENCOUNTER — Encounter: Payer: Self-pay | Admitting: Family

## 2018-07-03 ENCOUNTER — Ambulatory Visit: Payer: Medicare HMO | Admitting: Internal Medicine

## 2018-07-10 ENCOUNTER — Ambulatory Visit (INDEPENDENT_AMBULATORY_CARE_PROVIDER_SITE_OTHER): Payer: Medicare HMO | Admitting: Internal Medicine

## 2018-07-10 ENCOUNTER — Encounter: Payer: Self-pay | Admitting: Internal Medicine

## 2018-07-10 VITALS — BP 150/80 | HR 88 | Temp 98.6°F | Ht 69.0 in | Wt 217.0 lb

## 2018-07-10 DIAGNOSIS — Z23 Encounter for immunization: Secondary | ICD-10-CM

## 2018-07-10 DIAGNOSIS — I739 Peripheral vascular disease, unspecified: Secondary | ICD-10-CM

## 2018-07-10 DIAGNOSIS — I10 Essential (primary) hypertension: Secondary | ICD-10-CM | POA: Diagnosis not present

## 2018-07-10 DIAGNOSIS — Z6830 Body mass index (BMI) 30.0-30.9, adult: Secondary | ICD-10-CM

## 2018-07-10 DIAGNOSIS — Z6832 Body mass index (BMI) 32.0-32.9, adult: Secondary | ICD-10-CM

## 2018-07-10 DIAGNOSIS — R7303 Prediabetes: Secondary | ICD-10-CM

## 2018-07-10 DIAGNOSIS — E6609 Other obesity due to excess calories: Secondary | ICD-10-CM | POA: Diagnosis not present

## 2018-07-10 DIAGNOSIS — E785 Hyperlipidemia, unspecified: Secondary | ICD-10-CM

## 2018-07-10 DIAGNOSIS — I779 Disorder of arteries and arterioles, unspecified: Secondary | ICD-10-CM

## 2018-07-10 MED ORDER — TETANUS-DIPHTH-ACELL PERTUSSIS 5-2-15.5 LF-MCG/0.5 IM SUSP: 0.5000 mL | Freq: Once | INTRAMUSCULAR | 0 refills | Status: AC

## 2018-07-10 NOTE — Patient Instructions (Addendum)
Continue to get 5000 steps a day.   Cut down on your portions of starches and sweets. If you can lose weight, your cholesterol, sugar and blood pressure should all improve. Avoid using sudafed which will run your blood pressure up.

## 2018-07-10 NOTE — Progress Notes (Signed)
Location:  [facility] clinic Provider:  Rifka Ramey L. Mariea Clonts, D.O., C.M.D.  Goals of Care:  Advanced Directives 09/09/2017  Does Patient Have a Medical Advance Directive? Yes  Type of Paramedic of [place];Living will  Does patient want to make changes to medical advance directive? No - Patient declined  Copy of [place] in Chart? No - copy requested   Chief Complaint  Patient presents with  . Medical Management of Chronic Issues    follow-up    HPI: Patient is a 85 y.o. male seen today for medical management of chronic diseases.    [place].  He says he has [place] which is sinusitis.  He was taking sudafed after his heart attack 20 years ago.  He's been taking it occasionally b/c of his sinuses.  BP is up today.    After 4 days of the diuretic I gave him (chlorthalidone), it zonked him.  He is taking beet juice and toprol xl and ramipril.   BP at home was 134/74.  He says he gets excited to come here.  He is taking his bp meds in the morning which has helped.    He was checking out of whole foods and the cashier complimented him.    He's not been using ibuprofen lately.    He's taking vitamin C to boost his immune system to prevent the coronavirus.    He is walking now.  He's down 3 lbs from 6 mos ago.  He's trying to get 4000 steps a day at [employer].  He's going to shoot for 5000 now.    Discussed that his sugar and cholesterol are too high.  His son is trying to get him off the sugar.  He talks about how they put sugar in tobacco back in the day and he smoked for 30 some years.    Has f/u US of carotids in May.  Left had endarterectomy.  Right was 20%.    Past Medical History:  Diagnosis Date  . Acquired deformity of nose   . Calculus of kidney   . Carotid artery disease ([place])   . Cellulitis and abscess of unspecified site   . Cervicalgia   . CHF (congestive heart failure) ([place])   .  Coronary atherosclerosis of unspecified type of vessel, native or graft   . Dizziness and giddiness   . Encounter for long-term (current) use of other medications   . Esophagitis, unspecified   . Hyperlipidemia   . Hypertrophy of prostate with urinary obstruction and other lower urinary tract symptoms (LUTS)   . Insomnia, unspecified   . Internal hemorrhoids without mention of complication   . Obesity, unspecified   . Old myocardial infarction   . Other and unspecified hyperlipidemia   . Other premature beats   . Palpitations   . 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 Shortness of breath   . Slowing of urinary stream   . Special screening for malignant neoplasm of prostate   . Unspecified essential hypertension   . 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 Unspecified vitamin D deficiency   . Urinary frequency     Past Surgical History:  Procedure Laterality Date  . CABG X 4  2000  . CARDIAC CATHETERIZATION    . CAROTID ENDARTERECTOMY Left 06-14-14   CE  .  COLONOSCOPY  2007   Dr Lajoyce Corners, normal  . CORONARY ARTERY BYPASS GRAFT     2000  . ENDARTERECTOMY Left 06/14/2014   Procedure: LEFT CAROTID ENDARTERECTOMY WITH VASCUGUARD PATCH ANGIOPLASTY;  Surgeon: Serafina Mitchell, MD;  Location: [facility];  Service: Vascular;  Laterality: Left;  . ESOPHAGOGASTRODUODENOSCOPY ENDOSCOPY  2007   Dr Lajoyce Corners, with biopsy  . EXCISIONAL HEMORRHOIDECTOMY  1981  . LESION FROM FOREHEAD,(R) EAR (L) ARM     DR Almira Coaster  . LITHOTRIPSY  2005   Dr Almira Coaster  . TONSILLECTOMY  1940  . trench in right side of jaw after abcesses  06/01/2016    No Known Allergies  Outpatient Encounter Medications as of 07/10/2018  Medication Sig  . b complex vitamins tablet Take 1 tablet by mouth daily.  . cholecalciferol (VITAMIN D) 1000 UNITS tablet Take 1,000 Units by mouth daily.   . Cinnamon 500 MG TABS Take 1 tablet by mouth daily.  Marland Kitchen  CITRUS BERGAMOT PO Take 1 capsule by mouth daily. Take one capsule once daily to lower cholesterol  . Coenzyme Q10 (CO Q 10) 100 MG CAPS Take 1 capsule by mouth daily.  . diazepam (VALIUM) 5 MG tablet TAKE 1/2 TABLET BY MOUTH TWICE DAILY  . doxepin (SINEQUAN) 50 MG capsule TAKE 1 CAPSULE(50 MG) BY MOUTH AT BEDTIME AS NEEDED FOR INSOMNIA OR ANXIETY  . fish oil-omega-3 fatty acids 1000 MG capsule Take 2 g by mouth daily. Take one tablet once a day for cholesterol  . Fluticasone-Salmeterol (ADVAIR DISKUS) 250-50 MCG/DOSE AEPB inhale 1 puff by mouth twice a day  . ibuprofen (ADVIL,MOTRIN) 200 MG tablet Take 200 mg by mouth every 6 (six) hours as needed for pain. Take 3-4 tablets twice a day for neck pain as needed  . metoprolol succinate (TOPROL-XL) 50 MG 24 hr tablet TAKE 1 TABLET BY MOUTH ONCE DAILY TAKE WITH FOOD OR IMMEDIATELY FOLLOWING A MEAL  . Nutritional Supplements (NUTRITIONAL SUPPLEMENT PO) Ginger and Turmeric. Take 1 capsule by mouth daily.  . Potassium 99 MG TABS Take 1 tablet by mouth daily. Take one tablet once a day for cramps  . ramipril (ALTACE) 10 MG capsule TAKE 1 CAPSULE BY MOUTH EVERY DAY  . RESVERATROL 100 MG CAPS Take 1 capsule by mouth daily. Take one tablet once a day  . [DISCONTINUED] chlorthalidone (HYGROTON) 25 MG tablet Take 1 tablet (25 mg total) by mouth daily.   No facility-administered encounter medications on file as of 07/10/2018.     Review of Systems:  Review of Systems  Constitutional: Negative for chills, fever, malaise/fatigue and weight loss.  HENT: Positive for congestion, hearing loss and sinus pain. Negative for ear discharge and ear pain.   Eyes: Negative for blurred vision.       Glasses  Respiratory: Positive for cough. Negative for sputum production and shortness of breath.   Cardiovascular: Negative for chest pain, palpitations, leg swelling and PND.  Gastrointestinal: Negative for abdominal pain, blood in stool, constipation, diarrhea, melena,  nausea and vomiting.  Genitourinary: Negative for dysuria.  Musculoskeletal: Positive for joint pain. Negative for falls.  Skin: Negative for itching and rash.  Neurological: Negative for dizziness, loss of consciousness and headaches.  Endo/Heme/Allergies: Does not bruise/bleed easily.  Psychiatric/Behavioral: Negative for depression and memory loss. The patient has insomnia. The patient is not nervous/anxious.     Health Maintenance  Topic Date Due  . TETANUS/TDAP  05/03/2014  . INFLUENZA VACCINE  Completed  . PNA vac Low Risk Adult  Completed    Physical Exam: Vitals:   07/10/18 1410  BP: (!) 150/80  Pulse: 88  Temp: 98.6 F (37 C)  TempSrc: Oral  SpO2: 98%  Weight: 217 lb (98.4 kg)  Height: 5\' 9"  (1.753 m)   Body mass index is 32.05 kg/m. Physical Exam Vitals signs reviewed.  Constitutional:      Appearance: Normal appearance.  HENT:     Head: Normocephalic and atraumatic.     Ears:     Comments: HOH Eyes:     Extraocular Movements: Extraocular movements intact.     Conjunctiva/sclera: Conjunctivae normal.     Pupils: Pupils are equal, round, and reactive to light.     Comments: glasses  Cardiovascular:     Rate and Rhythm: Normal rate and regular rhythm.     Pulses: Normal pulses.     Heart sounds: Normal heart sounds.  Pulmonary:     Effort: Pulmonary effort is normal.     Breath sounds: Normal breath sounds.  Abdominal:     General: Bowel sounds are normal. There is no distension.     Palpations: Abdomen is soft. There is no mass.     Tenderness: There is no abdominal tenderness.  Musculoskeletal: Normal range of motion.  Skin:    General: Skin is warm and dry.     Capillary Refill: Capillary refill takes less than 2 seconds.  Neurological:     General: No focal deficit present.     Mental Status: He is alert and oriented to person, place, and time.  Psychiatric:        Mood and Affect: Mood normal.     Labs reviewed: Basic Metabolic  Panel: Recent Labs    01/17/18 0934  NA 140  K 4.6  CL 104  CO2 28  GLUCOSE 94  BUN 15  CREATININE 1.07  CALCIUM 9.0   Liver Function Tests: Recent Labs    01/17/18 0934  AST 17  ALT 14  BILITOT 0.6  PROT 6.7   No results for input(s): LIPASE, AMYLASE in the last 8760 hours. No results for input(s): AMMONIA in the last 8760 hours. CBC: Recent Labs    01/17/18 0934  WBC 8.2  NEUTROABS 4,781  HGB 14.3  HCT 41.6  MCV 96.1  PLT 230   Lipid Panel: Recent Labs    01/17/18 0934  CHOL 199  HDL 47  LDLCALC 133*  TRIG 90  CHOLHDL 4.2   Lab Results  Component Value Date   HGBA1C 5.8 (H) 01/17/2018    Assessment/Plan 1. Bilateral carotid artery disease, unspecified type ([place]) -has f/u US with vascular coming up, no s/s of TIAs or strokes  2. Need for Tdap vaccination - Tdap (ADACEL) 09-01-13.5 LF-MCG/0.5 injection; Inject 0.5 mLs into the muscle once for 1 dose.  Dispense: 0.5 mL; Refill: 0 Rx for shot sent to pharmacy   3. Class 1 obesity due to excess calories with serious comorbidity and body mass index (BMI) of 30.0 to 30.9 in adult -counseled on diet and exercise - CBC with Differential/Platelet; Future - COMPLETE METABOLIC PANEL WITH GFR; Future - Hemoglobin A1c; Future - Lipid panel; Future  4. BMI 32.0-32.9,adult -again, must eat healthier diet and walk--he says he is going to make changes and lose 20 lbs for next time - CBC with Differential/Platelet; Future - COMPLETE METABOLIC PANEL WITH GFR; Future - Hemoglobin A1c; Future - Lipid panel; Future  5. Prediabetes -ongoing issue, cont to walk and improve diet as counseled -  Hemoglobin A1c; Future  6. Essential hypertension -bp elevated, recheck improved but still high at 148/80, but he reports it's normal outside of the office  7. Hyperlipidemia, unspecified hyperlipidemia type - encouraged him to continue his walking routine, decrease beer, fatty foods and too many starches - Lipid panel;  Future  Labs/tests ordered:   Orders Placed This Encounter  Procedures  . CBC with Differential/Platelet    Standing Status:   Future    Standing Expiration Date:   07/10/2019  . COMPLETE METABOLIC PANEL WITH GFR    Standing Status:   Future    Standing Expiration Date:   07/10/2019  . Hemoglobin A1c    Standing Status:   Future    Standing Expiration Date:   07/10/2019  . Lipid panel    Standing Status:   Future    Standing Expiration Date:   07/10/2019   Next appt:  09/12/2018   Creighton Longley L. Chae Shuster, D.O. [facility] [address], [place] [address] Cell Phone (Mon-Fri 8am-5pm):  [phone] On Call:  [phone] & follow prompts after 5pm & weekends Office Phone:  [phone] Office Fax:  [phone]

## 2018-07-29 ENCOUNTER — Other Ambulatory Visit: Payer: Self-pay | Admitting: Internal Medicine

## 2018-08-16 ENCOUNTER — Other Ambulatory Visit: Payer: Self-pay | Admitting: Internal Medicine

## 2018-08-23 ENCOUNTER — Telehealth: Payer: Self-pay | Admitting: *Deleted

## 2018-08-23 NOTE — Telephone Encounter (Signed)
Received fax from [facility] stating they have APPROVED Doxepin through 05/03/2019

## 2018-08-27 ENCOUNTER — Encounter: Payer: Self-pay | Admitting: Internal Medicine

## 2018-09-11 ENCOUNTER — Ambulatory Visit: Payer: Self-pay

## 2018-09-11 ENCOUNTER — Encounter: Payer: Self-pay | Admitting: Family

## 2018-09-12 ENCOUNTER — Ambulatory Visit (INDEPENDENT_AMBULATORY_CARE_PROVIDER_SITE_OTHER): Payer: Medicare HMO | Admitting: Family

## 2018-09-12 ENCOUNTER — Encounter: Payer: Self-pay | Admitting: Family

## 2018-09-12 ENCOUNTER — Other Ambulatory Visit: Payer: Self-pay

## 2018-09-12 DIAGNOSIS — Z0000 Encounter for general adult medical examination without abnormal findings: Secondary | ICD-10-CM

## 2018-09-12 DIAGNOSIS — Z23 Encounter for immunization: Secondary | ICD-10-CM | POA: Diagnosis not present

## 2018-09-12 MED ORDER — TETANUS-DIPHTH-ACELL PERTUSSIS 5-2-15.5 LF-MCG/0.5 IM SUSP: 0.5000 mL | Freq: Once | INTRAMUSCULAR | 0 refills | Status: AC

## 2018-09-12 NOTE — Patient Instructions (Signed)
[name] , Thank you for taking time to come for your Medicare Wellness Visit. I appreciate your ongoing commitment to your health goals. Please review the following plan we discussed and let me know if I can assist you in the future.   Screening recommendations/referrals: Colonoscopy N/A due to advance age  Recommended yearly ophthalmology/optometry visit for glaucoma screening and checkup Recommended yearly dental visit for hygiene and checkup  Vaccinations: Influenza vaccine: Up to date  Pneumococcal vaccine: Up to date  Tdap vaccine:Awaiting to get vaccine in the pharmacy.  Shingles vaccine: Up to date     Advanced directives: yes   Conditions/risks identified: Advance age male >75 years,male Gender,Hypertension,Hx of smoking   Next appointment: 1 year   Preventive Care 83 Years and Older, Male Preventive care refers to lifestyle choices and visits with your health care provider that can promote health and wellness. What does preventive care include?  A yearly physical exam. This is also called an annual well check.  Dental exams once or twice a year.  Routine eye exams. Ask your health care provider how often you should have your eyes checked.  Personal lifestyle choices, including:  Daily care of your teeth and gums.  Regular physical activity.  Eating a healthy diet.  Avoiding tobacco and drug use.  Limiting alcohol use.  Practicing safe sex.  Taking low doses of aspirin every day.  Taking vitamin and mineral supplements as recommended by your health care provider. What happens during an annual well check? The services and screenings done by your health care provider during your annual well check will depend on your age, overall health, lifestyle risk factors, and family history of disease. Counseling  Your health care provider may ask you questions about your:  Alcohol use.  Tobacco use.  Drug use.  Emotional well-being.  Home and relationship  well-being.  Sexual activity.  Eating habits.  History of falls.  Memory and ability to understand (cognition).  Work and work Statistician. Screening  You may have the following tests or measurements:  Height, weight, and BMI.  Blood pressure.  Lipid and cholesterol levels. These may be checked every 5 years, or more frequently if you are over 43 years old.  Skin check.  Lung cancer screening. You may have this screening every year starting at age 83 if you have a 30-pack-year history of smoking and currently smoke or have quit within the past 15 years.  Fecal occult blood test (FOBT) of the stool. You may have this test every year starting at age 14.  Flexible sigmoidoscopy or colonoscopy. You may have a sigmoidoscopy every 5 years or a colonoscopy every 10 years starting at age 55.  Prostate cancer screening. Recommendations will vary depending on your family history and other risks.  Hepatitis C blood test.  Hepatitis B blood test.  Sexually transmitted disease (STD) testing.  Diabetes screening. This is done by checking your blood sugar (glucose) after you have not eaten for a while (fasting). You may have this done every 1-3 years.  Abdominal aortic aneurysm (AAA) screening. You may need this if you are a current or former smoker.  Osteoporosis. You may be screened starting at age 37 if you are at high risk. Talk with your health care provider about your test results, treatment options, and if necessary, the need for more tests. Vaccines  Your health care provider may recommend certain vaccines, such as:  Influenza vaccine. This is recommended every year.  Tetanus, diphtheria, and acellular  pertussis (Tdap, Td) vaccine. You may need a Td booster every 10 years.  Zoster vaccine. You may need this after age 45.  Pneumococcal 13-valent conjugate (PCV13) vaccine. One dose is recommended after age 23.  Pneumococcal polysaccharide (PPSV23) vaccine. One dose is  recommended after age 37. Talk to your health care provider about which screenings and vaccines you need and how often you need them. This information is not intended to replace advice given to you by your health care provider. Make sure you discuss any questions you have with your health care provider. Document Released: 05/16/2015 Document Revised: 01/07/2016 Document Reviewed: 02/18/2015 Elsevier Interactive Patient Education  2017 [place] Prevention in the Home Falls can cause injuries. They can happen to people of all ages. There are many things you can do to make your home safe and to help prevent falls. What can I do on the outside of my home?  Regularly fix the edges of walkways and driveways and fix any cracks.  Remove anything that might make you trip as you walk through a door, such as a raised step or threshold.  Trim any bushes or trees on the path to your home.  Use bright outdoor lighting.  Clear any walking paths of anything that might make someone trip, such as rocks or tools.  Regularly check to see if handrails are loose or broken. Make sure that both sides of any steps have handrails.  Any raised decks and porches should have guardrails on the edges.  Have any leaves, snow, or ice cleared regularly.  Use sand or salt on walking paths during winter.  Clean up any spills in your garage right away. This includes oil or grease spills. What can I do in the bathroom?  Use night lights.  Install grab bars by the toilet and in the tub and shower. Do not use towel bars as grab bars.  Use non-skid mats or decals in the tub or shower.  If you need to sit down in the shower, use a plastic, non-slip stool.  Keep the floor dry. Clean up any water that spills on the floor as soon as it happens.  Remove soap buildup in the tub or shower regularly.  Attach bath mats securely with double-sided non-slip rug tape.  Do not have throw rugs and other things on  the floor that can make you trip. What can I do in the bedroom?  Use night lights.  Make sure that you have a light by your bed that is easy to reach.  Do not use any sheets or blankets that are too big for your bed. They should not hang down onto the floor.  Have a firm chair that has side arms. You can use this for support while you get dressed.  Do not have throw rugs and other things on the floor that can make you trip. What can I do in the kitchen?  Clean up any spills right away.  Avoid walking on wet floors.  Keep items that you use a lot in easy-to-reach places.  If you need to reach something above you, use a strong step stool that has a grab bar.  Keep electrical cords out of the way.  Do not use floor polish or wax that makes floors slippery. If you must use wax, use non-skid floor wax.  Do not have throw rugs and other things on the floor that can make you trip. What can I do with my stairs?  Do not leave any items on the stairs.  Make sure that there are handrails on both sides of the stairs and use them. Fix handrails that are broken or loose. Make sure that handrails are as long as the stairways.  Check any carpeting to make sure that it is firmly attached to the stairs. Fix any carpet that is loose or worn.  Avoid having throw rugs at the top or bottom of the stairs. If you do have throw rugs, attach them to the floor with carpet tape.  Make sure that you have a light switch at the top of the stairs and the bottom of the stairs. If you do not have them, ask someone to add them for you. What else can I do to help prevent falls?  Wear shoes that:  Do not have high heels.  Have rubber bottoms.  Are comfortable and fit you well.  Are closed at the toe. Do not wear sandals.  If you use a stepladder:  Make sure that it is fully opened. Do not climb a closed stepladder.  Make sure that both sides of the stepladder are locked into place.  Ask someone to  hold it for you, if possible.  Clearly mark and make sure that you can see:  Any grab bars or handrails.  First and last steps.  Where the edge of each step is.  Use tools that help you move around (mobility aids) if they are needed. These include:  Canes.  Walkers.  Scooters.  Crutches.  Turn on the lights when you go into a dark area. Replace any light bulbs as soon as they burn out.  Set up your furniture so you have a clear path. Avoid moving your furniture around.  If any of your floors are uneven, fix them.  If there are any pets around you, be aware of where they are.  Review your medicines with your doctor. Some medicines can make you feel dizzy. This can increase your chance of falling. Ask your doctor what other things that you can do to help prevent falls. This information is not intended to replace advice given to you by your health care provider. Make sure you discuss any questions you have with your health care provider. Document Released: 02/13/2009 Document Revised: 09/25/2015 Document Reviewed: 05/24/2014 Elsevier Interactive Patient Education  2017 [employer].

## 2018-09-12 NOTE — Progress Notes (Signed)
Subjective:   Alex [name] is a 85 y.o. male who presents for Medicare Annual/Subsequent preventive examination.  Review of Systems:   Cardiac Risk Factors include: advanced age (>40men, >64 women);male gender;hypertension;smoking/ tobacco exposure     Objective:    Vitals: There were no vitals taken for this visit.  There is no height or weight on file to calculate BMI.  Advanced Directives 09/12/2018 09/09/2017 01/10/2017 09/01/2016 08/30/2016 05/30/2016 05/25/2016  Does Patient Have a Medical Advance Directive? Yes Yes Yes Yes Yes Yes Yes  Type of Paramedic of [place];Living will;Out of facility DNR (pink MOST or yellow form) [place];Living will [place];Living will [place];Living will [place];Living will Healthcare Power of [place];Living will  Does patient want to make changes to medical advance directive? No - Patient declined No - Patient declined - - No - Patient declined - -  Copy of [place] in Chart? - No - copy requested No - copy requested No - copy requested No - copy requested - No - copy requested    Tobacco Social History   Tobacco Use  Smoking Status Former Smoker  . Packs/day: 2.00  . Years: 25.00  . Pack years: 50.00  . Types: Cigarettes  . Last attempt to quit: 05/03/1973  . Years since quitting: 45.3  Smokeless Tobacco Never Used     Counseling given: Not Answered   Clinical Intake:  Pre-visit preparation completed: No  Pain : No/denies pain  Nutritional Risks: None Diabetes: No  How often do you need to have someone help you when you read instructions, pamphlets, or other written materials from your doctor or pharmacy?: 1 - Never What is the last grade level you completed in school?: college   Interpreter Needed?: No  Information entered by :: Jandel Patriarca FNP-C   Past Medical History:   Diagnosis Date  . Acquired deformity of nose   . Calculus of kidney   . Carotid artery disease ([place])   . Cellulitis and abscess of unspecified site   . Cervicalgia   . CHF (congestive heart failure) ([place])   . Coronary atherosclerosis of unspecified type of vessel, native or graft   . Dizziness and giddiness   . Encounter for long-term (current) use of other medications   . Esophagitis, unspecified   . Hyperlipidemia   . Hypertrophy of prostate with urinary obstruction and other lower urinary tract symptoms (LUTS)   . Insomnia, unspecified   . Internal hemorrhoids without mention of complication   . Obesity, unspecified   . Old myocardial infarction   . Other and unspecified hyperlipidemia   . Other premature beats   . Palpitations   . 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 Shortness of breath   . Slowing of urinary stream   . Special screening for malignant neoplasm of prostate   . Unspecified essential hypertension   . 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 Unspecified vitamin D deficiency   . Urinary frequency    Past Surgical History:  Procedure Laterality Date  . CABG X 4  2000  . CARDIAC CATHETERIZATION    . CAROTID ENDARTERECTOMY Left 06-14-14   CE  . COLONOSCOPY  2007   Dr Lajoyce Corners, normal  . CORONARY ARTERY BYPASS GRAFT     2000  . ENDARTERECTOMY Left  06/14/2014   Procedure: LEFT CAROTID ENDARTERECTOMY WITH VASCUGUARD PATCH ANGIOPLASTY;  Surgeon: Serafina Mitchell, MD;  Location: [facility];  Service: Vascular;  Laterality: Left;  . ESOPHAGOGASTRODUODENOSCOPY ENDOSCOPY  2007   Dr Lajoyce Corners, with biopsy  . EXCISIONAL HEMORRHOIDECTOMY  1981  . LESION FROM FOREHEAD,(R) EAR (L) ARM     DR Almira Coaster  . LITHOTRIPSY  2005   Dr Almira Coaster  . TONSILLECTOMY  1940  . trench in right side of jaw after abcesses  06/01/2016   Family History  Problem Relation Age of Onset  . Cancer Mother        LIVER   . Cancer Brother        PROSTATE  . Cancer Daughter    Social History   Socioeconomic History  . Marital status: Widowed    Spouse name: Not on file  . Number of children: Not on file  . Years of education: Not on file  . Highest education level: Not on file  Occupational History  . Not on file  Social Needs  . Financial resource strain: Not hard at all  . Food insecurity:    Worry: Never true    Inability: Never true  . Transportation needs:    Medical: No    Non-medical: No  Tobacco Use  . Smoking status: Former Smoker    Packs/day: 2.00    Years: 25.00    Pack years: 50.00    Types: Cigarettes    Last attempt to quit: 05/03/1973    Years since quitting: 45.3  . Smokeless tobacco: Never Used  Substance and Sexual Activity  . Alcohol use: Yes    Alcohol/week: 4.0 standard drinks    Types: 1 Glasses of wine, 3 Cans of beer per week  . Drug use: No  . Sexual activity: Never  Lifestyle  . Physical activity:    Days per week: 7 days    Minutes per session: 20 min  . Stress: To some extent  Relationships  . Social connections:    Talks on phone: More than three times a week    Gets together: More than three times a week    Attends religious service: Never    Active member of club or organization: No    Attends meetings of clubs or organizations: Never    Relationship status: Widowed  Other Topics Concern  . Not on file  Social History Narrative  . Not on file    Outpatient Encounter Medications as of 09/12/2018  Medication Sig  . b complex vitamins tablet Take 1 tablet by mouth daily.  . cholecalciferol (VITAMIN D) 1000 UNITS tablet Take 1,000 Units by mouth daily.   . Cinnamon 500 MG TABS Take 1 tablet by mouth daily.  Marland Kitchen CITRUS BERGAMOT PO Take 1 capsule by mouth daily. Take one capsule once daily to lower cholesterol  . Coenzyme Q10 (CO Q 10) 100 MG CAPS Take 1 capsule by mouth daily.  . diazepam (VALIUM) 5 MG tablet TAKE 1/2 TABLET BY MOUTH TWICE DAILY  .  doxepin (SINEQUAN) 50 MG capsule TAKE 1 CAPSULE(50 MG) BY MOUTH AT BEDTIME AS NEEDED FOR INSOMNIA OR ANXIETY  . fish oil-omega-3 fatty acids 1000 MG capsule Take 2 g by mouth daily. Take one tablet once a day for cholesterol  . Fluticasone-Salmeterol (ADVAIR DISKUS) 250-50 MCG/DOSE AEPB inhale 1 puff by mouth twice a day  . metoprolol succinate (TOPROL-XL) 50 MG 24 hr tablet TAKE 1 TABLET BY MOUTH ONCE DAILY TAKE WITH  FOOD OR IMMEDIATELY FOLLOWING A MEAL  . Nutritional Supplements (NUTRITIONAL SUPPLEMENT PO) Ginger and Turmeric. Take 1 capsule by mouth daily.  . Potassium 99 MG TABS Take 1 tablet by mouth daily. Take one tablet once a day for cramps  . ramipril (ALTACE) 10 MG capsule TAKE 1 CAPSULE BY MOUTH EVERY DAY  . RESVERATROL 100 MG CAPS Take 1 capsule by mouth daily. Take one tablet once a day   No facility-administered encounter medications on file as of 09/12/2018.     Activities of Daily Living In your present state of health, do you have any difficulty performing the following activities: 09/12/2018  Hearing? N  Vision? N  Difficulty concentrating or making decisions? Y  Comment can't remember names sometimes  Walking or climbing stairs? N  Dressing or bathing? N  Doing errands, shopping? N  Preparing Food and eating ? N  Using the Toilet? N  In the past six months, have you accidently leaked urine? Y  Do you have problems with loss of bowel control? N  Managing your Medications? N  Managing your Finances? N  Housekeeping or managing your Housekeeping? N  Some recent data might be hidden    Patient Care Team: Gayland Curry, DO as PCP - General (Geriatric Medicine) Lorretta Harp, MD as Consulting Physician (Cardiology) Serafina Mitchell, MD as Consulting Physician (Vascular Surgery) Shon Hough, MD as Consulting Physician (Ophthalmology) Bjorn Loser, MD as Consulting Physician (Urology) Shon Hough, MD as Consulting Physician (Ophthalmology)    Assessment:   This is a routine wellness examination for [place].  Exercise Activities and Dietary recommendations Current Exercise Habits: Home exercise routine, Type of exercise: walking, Time (Minutes): 10, Frequency (Times/Week): 7, Weekly Exercise (Minutes/Week): 70, Intensity: Moderate, Exercise limited by: None identified  Goals    . Increase water intake     Starting 08/30/2016 I will start drinking 3 glasses of water a day.       Fall Risk Fall Risk  09/12/2018 07/10/2018 01/19/2018 09/09/2017 07/14/2017  Falls in the past year? 0 0 No No No  Number falls in past yr: 0 0 - - -  Injury with Fall? 0 0 - - -   Is the patient's home free of loose throw rugs in walkways, pet beds, electrical cords, etc?   yes      Grab bars in the bathroom? yes      Handrails on the stairs?   yes      Adequate lighting?   yes  Depression Screen PHQ 2/9 Scores 09/12/2018 07/10/2018 01/19/2018 09/09/2017  PHQ - 2 Score 0 0 0 1    Cognitive Function MMSE - Mini Mental State Exam 09/09/2017 08/30/2016 08/27/2015  Orientation to time 5 5 5   Orientation to Place 5 5 5   Registration 3 3 3   Attention/ Calculation 5 5 5   Recall 2 0 3  Language- name 2 objects 2 2 2   Language- repeat 1 1 1   Language- follow 3 step command 3 3 3   Language- read & follow direction 1 1 1   Write a sentence 1 1 1   Copy design 1 0 1  Total score 29 26 30      6CIT Screen 09/12/2018  What Year? 0 points  What month? 0 points  What time? 0 points  Count back from 20 0 points  Months in reverse 0 points  Repeat phrase 0 points  Total Score 0    Immunization History  Administered Date(s) Administered  . Influenza  Whole 02/06/2013  . Influenza, High Dose Seasonal PF 12/09/2015, 01/10/2017, 01/19/2018  . Influenza-Unspecified 01/31/2009, 01/08/2014, 12/13/2014  . Pneumococcal Conjugate-13 11/08/2013  . Pneumococcal-Unspecified 05/03/2004  . Td 05/03/2004  . Zoster 07/19/2012    Qualifies for Shingles Vaccine? Up to date    Screening Tests Health Maintenance  Topic Date Due  . TETANUS/TDAP  05/03/2014  . INFLUENZA VACCINE  12/02/2018  . PNA vac Low Risk Adult  Completed   Cancer Screenings: Lung: Low Dose CT Chest recommended if Age 71-80 years, 30 pack-year currently smoking OR have quit w/in 15years. Patient does not qualify. Colorectal: N/A due to age.   Additional Screenings: Hepatitis C Screening: low risk    Plan:   - Tdap vaccine order placed.   I have personally reviewed and noted the following in the patient's chart:   . Medical and social history . Use of alcohol, tobacco or illicit drugs  . Current medications and supplements . Functional ability and status . Nutritional status . Physical activity . Advanced directives . List of other physicians . Hospitalizations, surgeries, and ER visits in previous 12 months . Vitals . Screenings to include cognitive, depression, and falls . Referrals and appointments  In addition, I have reviewed and discussed with patient certain preventive protocols, quality metrics, and best practice recommendations. A written personalized care plan for preventive services as well as general preventive health recommendations were provided to patient.   Sandrea Hughs, NP  09/12/2018

## 2018-09-12 NOTE — Progress Notes (Signed)
   This service is provided via telemedicine  No vital signs collected/recorded due to the encounter was a telemedicine visit.   Location of patient (ex: home, work):  home  Patient consents to a telephone visit:  Yes  Location of the provider (ex: office, home):  Office  Name of any referring provider:  Dr. Hollace Kinnier  Names of all persons participating in the telemedicine service and their role in the encounter:  Ruthell Rummage CMA, Dinah Ngetich NP, Jerlyn Ly   Time spent on call:  Ruthell Rummage CMA  Spent  Minutes on the phone.

## 2018-09-26 ENCOUNTER — Other Ambulatory Visit: Payer: Self-pay | Admitting: Internal Medicine

## 2018-11-20 ENCOUNTER — Other Ambulatory Visit: Payer: Self-pay | Admitting: Internal Medicine

## 2018-11-21 ENCOUNTER — Other Ambulatory Visit: Payer: Self-pay

## 2018-11-21 DIAGNOSIS — I6523 Occlusion and stenosis of bilateral carotid arteries: Secondary | ICD-10-CM

## 2018-11-24 ENCOUNTER — Telehealth (HOSPITAL_COMMUNITY): Payer: Self-pay

## 2018-11-24 NOTE — Telephone Encounter (Signed)

## 2018-11-27 ENCOUNTER — Other Ambulatory Visit: Payer: Self-pay | Admitting: Internal Medicine

## 2018-11-27 ENCOUNTER — Other Ambulatory Visit: Payer: Self-pay

## 2018-11-27 ENCOUNTER — Encounter: Payer: Self-pay | Admitting: Family

## 2018-11-27 ENCOUNTER — Ambulatory Visit (HOSPITAL_COMMUNITY)
Admission: RE | Admit: 2018-11-27 | Discharge: 2018-11-27 | Disposition: A | Discharge status: Home / Self Care | Payer: Medicare HMO | Source: Ambulatory Visit | Attending: Family | Admitting: Family

## 2018-11-27 ENCOUNTER — Ambulatory Visit (INDEPENDENT_AMBULATORY_CARE_PROVIDER_SITE_OTHER): Payer: Medicare HMO | Admitting: Family

## 2018-11-27 VITALS — BP 144/75 | HR 68 | Temp 98.8°F | Resp 18 | Ht 70.0 in | Wt 214.0 lb

## 2018-11-27 DIAGNOSIS — Z9889 Other specified postprocedural states: Secondary | ICD-10-CM

## 2018-11-27 DIAGNOSIS — I6523 Occlusion and stenosis of bilateral carotid arteries: Secondary | ICD-10-CM | POA: Insufficient documentation

## 2018-11-27 NOTE — Progress Notes (Signed)
Chief Complaint: Follow up Extracranial Carotid Artery Stenosis   History of Present Illness  Alex [name] is a 85 y.o. male who is s/p left carotid endarterectomy with bovine pericardial patch angioplasty on 06/14/2014 by Dr. Trula Slade. Intraoperative findings included 85% stenosis. The lesion was approximately 2.5 cm above the carotid bifurcation. The patient had a very stiff neck making exposure difficult therefore a shunt was not placed.  He remains neurologically intact.  The patient denies any history of TIA or stroke symptoms, specifically he denies a history of amaurosis fugax or monocular blindness, unilateral facial drooping, hemiplegia, or receptive or expressive aphasia.   He had a CABG at age 32, MI prior to this.   He denies claudication type symptoms with walking, but does report moderate issues with his knees.   He had a severe dental abscess that required 12 days hospitalization [facility] [place]. He required an I&D of right mandible area. Since this he has some numbness on the right side of his face and lips.  Diabetic: borderline, A1C was 5.8 on 01-17-18 Tobacco use: former smoker, quit in the 1980's  Pt meds include: Statin :no ASA:yes Other anticoagulants/antiplatelets: no   Past Medical History:  Diagnosis Date  . Acquired deformity of nose   . Calculus of kidney   . Carotid artery disease ([place])   . Cellulitis and abscess of unspecified site   . Cervicalgia   . CHF (congestive heart failure) ([place])   . Coronary atherosclerosis of unspecified type of vessel, native or graft   . Dizziness and giddiness   . Encounter for long-term (current) use of other medications   . Esophagitis, unspecified   . Hyperlipidemia   . Hypertrophy of prostate with urinary obstruction and other lower urinary tract symptoms (LUTS)   . Insomnia, unspecified   . Internal hemorrhoids without mention of complication   . Obesity, unspecified    . Old myocardial infarction   . Other and unspecified hyperlipidemia   . Other premature beats   . Palpitations   . 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 Shortness of breath   . Slowing of urinary stream   . Special screening for malignant neoplasm of prostate   . Unspecified essential hypertension   . 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 Unspecified vitamin D deficiency   . Urinary frequency     Social History Social History   Tobacco Use  . Smoking status: Former Smoker    Packs/day: 2.00    Years: 25.00    Pack years: 50.00    Types: Cigarettes    Quit date: 05/03/1973    Years since quitting: 45.6  . Smokeless tobacco: Never Used  Substance Use Topics  . Alcohol use: Yes    Alcohol/week: 4.0 standard drinks    Types: 1 Glasses of wine, 3 Cans of beer per week  . Drug use: No    Family History Family History  Problem Relation Age of Onset  . Cancer Mother        LIVER  . Cancer Brother        PROSTATE  . Cancer Daughter     Surgical History Past Surgical History:  Procedure Laterality Date  . CABG X 4  2000  . CARDIAC CATHETERIZATION    . CAROTID ENDARTERECTOMY Left 06-14-14   CE  . COLONOSCOPY  2007   Dr Lajoyce Corners, normal  .  CORONARY ARTERY BYPASS GRAFT     2000  . ENDARTERECTOMY Left 06/14/2014   Procedure: LEFT CAROTID ENDARTERECTOMY WITH VASCUGUARD PATCH ANGIOPLASTY;  Surgeon: Serafina Mitchell, MD;  Location: [facility];  Service: Vascular;  Laterality: Left;  . ESOPHAGOGASTRODUODENOSCOPY ENDOSCOPY  2007   Dr Lajoyce Corners, with biopsy  . EXCISIONAL HEMORRHOIDECTOMY  1981  . LESION FROM FOREHEAD,(R) EAR (L) ARM     DR Almira Coaster  . LITHOTRIPSY  2005   Dr Almira Coaster  . TONSILLECTOMY  1940  . trench in right side of jaw after abcesses  06/01/2016    No Known Allergies  Current Outpatient Medications  Medication Sig Dispense Refill  . b complex vitamins tablet Take 1 tablet by  mouth daily.    . cholecalciferol (VITAMIN D) 1000 UNITS tablet Take 1,000 Units by mouth daily.     . Cinnamon 500 MG TABS Take 1 tablet by mouth daily.    Marland Kitchen CITRUS BERGAMOT PO Take 1 capsule by mouth daily. Take one capsule once daily to lower cholesterol    . Coenzyme Q10 (CO Q 10) 100 MG CAPS Take 1 capsule by mouth daily.    . diazepam (VALIUM) 5 MG tablet TAKE 1/2 TABLET BY MOUTH TWICE DAILY 30 tablet 2  . doxepin (SINEQUAN) 50 MG capsule TAKE 1 CAPSULE(50 MG) BY MOUTH AT BEDTIME AS NEEDED FOR INSOMNIA OR ANXIETY 90 capsule 0  . fish oil-omega-3 fatty acids 1000 MG capsule Take 2 g by mouth daily. Take one tablet once a day for cholesterol    . Fluticasone-Salmeterol (ADVAIR DISKUS) 250-50 MCG/DOSE AEPB inhale 1 puff by mouth twice a day 60 each 5  . metoprolol succinate (TOPROL-XL) 50 MG 24 hr tablet TAKE 1 TABLET BY MOUTH ONCE DAILY TAKE WITH FOOD OR IMMEDIATELY FOLLOWING A MEAL 90 tablet 1  . Nutritional Supplements (NUTRITIONAL SUPPLEMENT PO) Ginger and Turmeric. Take 1 capsule by mouth daily.    . Potassium 99 MG TABS Take 1 tablet by mouth daily. Take one tablet once a day for cramps    . ramipril (ALTACE) 10 MG capsule TAKE 1 CAPSULE BY MOUTH EVERY DAY 90 capsule 1  . RESVERATROL 100 MG CAPS Take 1 capsule by mouth daily. Take one tablet once a day     No current facility-administered medications for this visit.     Review of Systems : See HPI for pertinent positives and negatives.  Physical Examination  Vitals:   11/27/18 1416  BP: (!) 144/75  Pulse: 68  Resp: 18  Temp: 98.8 F (37.1 C)  TempSrc: Temporal  SpO2: 95%  Weight: 214 lb (97.1 kg)  Height: 5\' 10"  (1.778 m)   Body mass index is 30.71 kg/m.  General: WDWN obese male in NAD GAIT: normal Eyes: PERRLA HENT: No gross abnormalities.  Pulmonary:  Respirations are non-labored, good air movement in all fields, CTAB, no rales,  rhonchi, or wheezing. Cardiac: regular rhythm, no detected murmur.  VASCULAR  EXAM Carotid Bruits Right Left   Positive Negative     Abdominal aortic pulse is not palpable. Radial pulses are 2+ palpable and equal.  LE Pulses Right Left       POPLITEAL  not palpable   not palpable       POSTERIOR TIBIAL  2+ palpable   2+ palpable        DORSALIS PEDIS      ANTERIOR TIBIAL not palpable  not palpable    Gastrointestinal: soft, nontender, BS WNL, no r/g, no palpable masses. Musculoskeletal: no muscle atrophy/wasting. M/S 5/5 throughout, extremities without ischemic changes Skin: No rashes, no ulcers, no cellulitis.   Neurologic:  A&O X 3; appropriate affect, sensation is normal; speech is normal, CN 2-12 intact, pain and light touch intact in extremities, motor exam as listed above. Psychiatric: Normal thought content, mood appropriate to clinical situation.    Assessment: Alex [name] is a 85 y.o. male who is s/p left carotid endarterectomy with bovine pericardial patch angioplasty on 06/14/2014. He has no history of stroke or TIA.  Carotid duplex today remains stable since 2017: 1-39% right ICA stenosis and  widely patent left ICA (CEA site) with no evidence of restenosis.  His atherosclerotic risk factors include history of smoking 2 ppd x 25 years (quit in the 1980's), borderline DM, CAD, and obesity.  He does not take a statin, he does take a daily niacin and ASA.  He prefers not to take a statin.    DATA Carotid Duplex (11-27-18): 1-39% right ICA stenosis and  Widely patent left ICA (CEA site) with no evidence of restenosis. Bilateral vertebral artery flow is antegrade.  Bilateral subclavian artery waveforms are normal. No significant change compared to examson 07-07-15, 08-11-16, and 08-15-17.   Plan: Follow-up in 18 months with Carotid Duplex scan.   I discussed in depth with the patient the nature of atherosclerosis, and  emphasized the importance of maximal medical management including strict control of blood pressure, blood glucose, and lipid levels, obtaining regular exercise, and continued cessation of smoking.  The patient is aware that without maximal medical management the underlying atherosclerotic disease process will progress, limiting the benefit of any interventions. The patient was given information about stroke prevention and what symptoms should prompt the patient to seek immediate medical care. Thank you for allowing [place] to participate in this patient's care.  Clemon Chambers, RN, MSN, FNP-C [facility] Office: [phone]  Clinic Physician: Oneida Alar  11/27/18 2:27 PM

## 2018-11-27 NOTE — Patient Instructions (Signed)

## 2018-11-27 NOTE — Telephone Encounter (Signed)
Last seen 09/26/2018

## 2018-12-18 ENCOUNTER — Other Ambulatory Visit: Payer: Self-pay | Admitting: Internal Medicine

## 2018-12-18 DIAGNOSIS — I10 Essential (primary) hypertension: Secondary | ICD-10-CM

## 2018-12-19 ENCOUNTER — Ambulatory Visit (INDEPENDENT_AMBULATORY_CARE_PROVIDER_SITE_OTHER): Payer: Medicare HMO | Admitting: Family

## 2018-12-19 ENCOUNTER — Other Ambulatory Visit: Payer: Self-pay

## 2018-12-19 ENCOUNTER — Encounter: Payer: Self-pay | Admitting: Family

## 2018-12-19 DIAGNOSIS — Z201 Contact with and (suspected) exposure to tuberculosis: Secondary | ICD-10-CM

## 2018-12-19 DIAGNOSIS — I10 Essential (primary) hypertension: Secondary | ICD-10-CM

## 2018-12-19 MED ORDER — METOPROLOL SUCCINATE ER 50 MG PO TB24: ORAL_TABLET | ORAL | 1 refills | Status: DC

## 2018-12-19 NOTE — Progress Notes (Signed)
This service is provided via telemedicine  No vital signs collected/recorded due to the encounter was a telemedicine visit.   Location of patient (ex: home, work):  Home   Patient consents to a telephone visit:  Yes   Location of the provider (ex: office, home):  Office   Name of any referring provider:  Dr. Hollace Kinnier, DO  Names of all persons participating in the telemedicine service and their role in the encounter:  Dinah Ngetich NP, Ruthell Rummage CMA, and Jerlyn Ly  Time spent on call: Ruthell Rummage CMA, spent  7 Minutes on phone with patient.     [facility]  Provider: Marlowe Sax, NP   Code Status: DNR Goals of Care:  Advanced Directives 09/12/2018  Does Patient Have a Medical Advance Directive? Yes  Type of Paramedic of [place];Living will;Out of facility DNR (pink MOST or yellow form)  Does patient want to make changes to medical advance directive? No - Patient declined  Copy of [place] in Chart? -     Chief Complaint  Patient presents with  . Acute Visit    Friend Tested positive for TB and is in hospital. Would like to know if he should be tested for TB    HPI: Patient is a Alex [name] seen today for an acute visit for TB evaluation.He states his friend Tested positive for TB and is in hospital.He was in contact with his friend 2 weeks ago in the emergency room and later visited him in the Hospital room.He would like to know if he should be tested for TB.He states his appetite is good.No Nausea or vomiting.He denies any fever,chills,cough,shortness of breath,chest pain or night sweats.  Past Medical History:  Diagnosis Date  . Acquired deformity of nose   . Calculus of kidney   . Carotid artery disease ([place])   . Cellulitis and abscess of unspecified site   . Cervicalgia   . CHF (congestive heart failure) ([employer])   . Coronary atherosclerosis of unspecified type of vessel, native or graft   . Dizziness and  giddiness   . Encounter for long-term (current) use of other medications   . Esophagitis, unspecified   . Hyperlipidemia   . Hypertrophy of prostate with urinary obstruction and other lower urinary tract symptoms (LUTS)   . Insomnia, unspecified   . Internal hemorrhoids without mention of complication   . Obesity, unspecified   . Old myocardial infarction   . Other and unspecified hyperlipidemia   . Other premature beats   . Palpitations   . 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 Shortness of breath   . Slowing of urinary stream   . Special screening for malignant neoplasm of prostate   . Unspecified essential hypertension   . 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 Unspecified vitamin D deficiency   . Urinary frequency     Past Surgical History:  Procedure Laterality Date  . CABG X 4  2000  . CARDIAC CATHETERIZATION    . CAROTID ENDARTERECTOMY Left 06-14-14   CE  . COLONOSCOPY  2007   Dr Lajoyce Corners, normal  . CORONARY ARTERY BYPASS GRAFT     2000  . ENDARTERECTOMY Left 06/14/2014   Procedure: LEFT CAROTID ENDARTERECTOMY WITH VASCUGUARD PATCH ANGIOPLASTY;  Surgeon: Serafina Mitchell, MD;  Location: [facility];  Service: Vascular;  Laterality: Left;  . ESOPHAGOGASTRODUODENOSCOPY ENDOSCOPY  2007   Dr Lajoyce Corners, with biopsy  . EXCISIONAL HEMORRHOIDECTOMY  1981  . LESION FROM FOREHEAD,(R) EAR (L) ARM     DR Almira Coaster  . LITHOTRIPSY  2005   Dr Almira Coaster  . TONSILLECTOMY  1940  . trench in right side of jaw after abcesses  06/01/2016    No Known Allergies  Outpatient Encounter Medications as of 12/19/2018  Medication Sig  . b complex vitamins tablet Take 1 tablet by mouth daily.  . cholecalciferol (VITAMIN D) 1000 UNITS tablet Take 1,000 Units by mouth daily.   . Cinnamon 500 MG TABS Take 1 tablet by mouth daily.  Marland Kitchen CITRUS BERGAMOT PO Take 1 capsule by mouth daily. Take one capsule once daily to lower  cholesterol  . Coenzyme Q10 (CO Q 10) 100 MG CAPS Take 1 capsule by mouth daily.  . diazepam (VALIUM) 5 MG tablet TAKE 1/2 TABLET BY MOUTH TWICE DAILY  . doxepin (SINEQUAN) 50 MG capsule TAKE 1 CAPSULE(50 MG) BY MOUTH AT BEDTIME AS NEEDED FOR INSOMNIA OR ANXIETY  . fish oil-omega-3 fatty acids 1000 MG capsule Take 2 g by mouth daily. Take one tablet once a day for cholesterol  . Fluticasone-Salmeterol (ADVAIR DISKUS) 250-50 MCG/DOSE AEPB inhale 1 puff by mouth twice a day  . metoprolol succinate (TOPROL-XL) 50 MG 24 hr tablet TAKE 1 TABLET BY MOUTH ONCE DAILY TAKE WITH FOOD OR IMMEDIATELY FOLLOWING A MEAL  . Nutritional Supplements (NUTRITIONAL SUPPLEMENT PO) Ginger and Turmeric. Take 1 capsule by mouth daily.  . Potassium 99 MG TABS Take 1 tablet by mouth daily. Take one tablet once a day for cramps  . ramipril (ALTACE) 10 MG capsule TAKE 1 CAPSULE BY MOUTH EVERY DAY  . RESVERATROL 100 MG CAPS Take 1 capsule by mouth daily. Take one tablet once a day   No facility-administered encounter medications on file as of 12/19/2018.     Review of Systems:  Review of Systems  Constitutional: Negative for appetite change, chills, fatigue and fever.  HENT: Negative for congestion, postnasal drip, rhinorrhea, sinus pressure, sinus pain, sneezing and sore throat.   Respiratory: Negative for cough, chest tightness, shortness of breath and wheezing.   Cardiovascular: Negative for chest pain, palpitations and leg swelling.  Gastrointestinal: Negative for abdominal distention, abdominal pain, constipation, diarrhea, nausea and vomiting.  Genitourinary: Negative for difficulty urinating, dysuria, flank pain, frequency and urgency.  Skin: Negative for color change, pallor and rash.  Neurological: Negative for dizziness, light-headedness and headaches.  Psychiatric/Behavioral: Negative for sleep disturbance. The patient is not nervous/anxious.     Health Maintenance  Topic Date Due  . TETANUS/TDAP   05/03/2014  . INFLUENZA VACCINE  12/02/2018  . PNA vac Low Risk Adult  Completed    Physical Exam: There were no vitals filed for this visit. There is no height or weight on file to calculate BMI. Physical Exam Unable to complete on telephone.   Labs reviewed: Basic Metabolic Panel: Recent Labs    01/17/18 0934  NA 140  K 4.6  CL 104  CO2 28  GLUCOSE 94  BUN 15  CREATININE 1.07  CALCIUM 9.0   Liver Function Tests: Recent Labs    01/17/18 0934  AST 17  ALT 14  BILITOT 0.6  PROT 6.7   CBC: Recent Labs    01/17/18 0934  WBC 8.2  NEUTROABS 4,781  HGB 14.3  HCT 41.6  MCV 96.1  PLT 230   Lipid Panel: Recent Labs    01/17/18 0934  CHOL 199  HDL 47  LDLCALC 133*  TRIG 90  CHOLHDL 4.2   Lab Results  Component Value Date   HGBA1C 5.8 (H) 01/17/2018    Procedures since last visit: Vas US Carotid  Result Date: 11/28/2018 Carotid Arterial Duplex Study Indications:       Carotid artery disease and Endarterectomy. Risk Factors:      Hypertension, hyperlipidemia, past history of smoking, prior                    MI, coronary artery disease. Other Factors:     Left carotid endarterectomy 06/14/2014. Comparison Study:  Prior duplex 08/15/2017 showed a 1-39% right ICA stenosis and                    no left ICA stenosis status post endarterectomy. Performing Technologist: Delorise Shiner RVT  Examination Guidelines: A complete evaluation includes B-mode imaging, spectral Doppler, color Doppler, and power Doppler as needed of all accessible portions of each vessel. Bilateral testing is considered an integral part of a complete examination. Limited examinations for reoccurring indications may be performed as noted.  Right Carotid Findings: +----------+--------+--------+--------+--------+--------+           PSV cm/sEDV cm/sStenosisDescribeComments +----------+--------+--------+--------+--------+--------+ CCA Prox  118     11                                +----------+--------+--------+--------+--------+--------+ CCA Mid   85      12                               +----------+--------+--------+--------+--------+--------+ CCA Distal85      13                               +----------+--------+--------+--------+--------+--------+ ICA Prox  102     16      1-39%   diffuse          +----------+--------+--------+--------+--------+--------+ ICA Mid   101     26                               +----------+--------+--------+--------+--------+--------+ ICA Distal96      23                               +----------+--------+--------+--------+--------+--------+ ECA       203     16                               +----------+--------+--------+--------+--------+--------+ +----------+--------+-------+----------------+-------------------+           PSV cm/sEDV cmsDescribe        Arm Pressure (mmHG) +----------+--------+-------+----------------+-------------------+ [id number]            Multiphasic, WNL                    +----------+--------+-------+----------------+-------------------+ +---------+--------+--+--------+-+---------+ VertebralPSV cm/s32EDV cm/s8Antegrade +---------+--------+--+--------+-+---------+  Left Carotid Findings: +----------+--------+--------+--------+----------------------+--------+           PSV cm/sEDV cm/sStenosisDescribe              Comments +----------+--------+--------+--------+----------------------+--------+ CCA Prox  138     21                                             +----------+--------+--------+--------+----------------------+--------+  CCA Mid   134     22              smooth and homogeneous         +----------+--------+--------+--------+----------------------+--------+ CCA Distal123     12                                             +----------+--------+--------+--------+----------------------+--------+ ICA Prox  65      16      Normal                                  +----------+--------+--------+--------+----------------------+--------+ ICA Mid   70      21                                             +----------+--------+--------+--------+----------------------+--------+ ICA Distal86      21                                             +----------+--------+--------+--------+----------------------+--------+ ECA       142     13                                             +----------+--------+--------+--------+----------------------+--------+ +----------+--------+--------+----------------+-------------------+ SubclavianPSV cm/sEDV cm/sDescribe        Arm Pressure (mmHG) +----------+--------+--------+----------------+-------------------+           164     0       Multiphasic, WNL                    +----------+--------+--------+----------------+-------------------+ +---------+--------+--+--------+--+---------+ VertebralPSV cm/s44EDV cm/s10Antegrade +---------+--------+--+--------+--+---------+  Summary: Right Carotid: Velocities in the right ICA are consistent with a 1-39% stenosis. Left Carotid: There is no evidence of stenosis in the left ICA. Vertebrals:  Bilateral vertebral arteries demonstrate antegrade flow. Subclavians: Normal flow hemodynamics were seen in bilateral subclavian              arteries. *See table(s) above for measurements and observations.  Electronically signed by Curt Jews MD on 11/28/2018 at 12:53:07 PM.    Final     Assessment/Plan 1. Essential hypertension Reports B/P within normal range.Requires medication refill. - metoprolol succinate (TOPROL-XL) 50 MG 24 hr tablet; TAKE 1 TABLET BY MOUTH ONCE DAILY TAKE WITH FOOD OR IMMEDIATELY FOLLOWING A MEAL  Dispense: 90 tablet; Refill: 1  2. TB (tuberculosis) contact Afebrile.Had close contact with a friend two weeks ago who is currently in the hospital tested positive for TB.He is asymptomatic.Has read signs and symptoms of TB on the Internet and  has been checking his temperature.Discussed with patient to contact the health Department get tested.Has telephone number for the [facility] Department.Education information on TB provided.patient aware to call PCP or health Department if symptoms of TB develop.    Labs/tests ordered:   Next appt:  01/09/2019 with Dr.Reed for 6 months follow up.  Spent 11 minutes of non-face to face with patient

## 2018-12-19 NOTE — Patient Instructions (Addendum)
Tuberculosis Tuberculosis (TB) is an infection that usually affects your lungs. It can affect other parts of your body too. There are two kinds of TB:  Active TB. This means you have symptoms of TB. It also means your infection can spread to someone (you are contagious).  Latent TB. This means you do not have any symptoms of TB. It also means you cannot spread TB to someone. It is important to get treatment no matter what kind of TB you have. Follow these instructions at home:    Medicines  Take over-the-counter and prescription medicines only as told by your doctor.  Take your antibiotic medicine as told by your doctor. Do not stop taking the antibiotic even if you start to feel better. You may need to take antibiotics for up to 6-9 months. Activity  Rest as needed. Ask your doctor what activities are safe for you.  Do not go back to work or school until your doctor says it is safe to do that.  Avoid close contact with others (especially babies and older people). Do this until your doctor says you will no longer spread TB. General instructions  Tell your doctor about all the people you live with and all the people you have close contact with. Those people may need to be tested for TB.  Do not use any products that have nicotine or tobacco in them. These include cigarettes and e-cigarettes. If you need help quitting, ask your doctor.  Cover your mouth and nose when you cough or sneeze. Get rid of used tissues as told by your doctor.  Wash your hands often with soap and water. If you do not have soap and water, use hand sanitizer.  Keep all follow-up visits as told by your doctor. This is important. Contact a doctor if:  You have new symptoms.  You are not hungry (loss of appetite).  You feel sick to your stomach (nauseous).  You throw up (vomit).  Your pee (urine) is dark yellow.  Your skin or the white part of your eyes looks kind of yellow (jaundice).  Your symptoms  get worse.  Your symptoms do not go away with treatment.  You have a fever. Get help right away if:  You have chest pain.  You cough up blood.  You have trouble breathing.  You feel short of breath.  You have a headache.  You have a stiff neck. Summary  Tuberculosis (TB) is an infection that usually affects your lungs. It can affect other parts of your body too.  The two kinds of TB are active TB and latent TB.  Get treatment no matter what kind of TB you have.  Take your antibiotic medicine as told by your doctor. Do not stop taking the antibiotic even if you start to feel better. You may need to take antibiotics for up to 6-9 months. This information is not intended to replace advice given to you by your health care provider. Make sure you discuss any questions you have with your health care provider. Document Released: 02/14/2009 Document Revised: 07/19/2017 Document Reviewed: 11/05/2016 Elsevier Patient Education  [place].

## 2019-01-09 ENCOUNTER — Other Ambulatory Visit: Payer: Self-pay

## 2019-01-09 ENCOUNTER — Other Ambulatory Visit: Payer: Medicare HMO

## 2019-01-09 DIAGNOSIS — R7303 Prediabetes: Secondary | ICD-10-CM

## 2019-01-09 DIAGNOSIS — Z6830 Body mass index (BMI) 30.0-30.9, adult: Secondary | ICD-10-CM | POA: Diagnosis not present

## 2019-01-09 DIAGNOSIS — E785 Hyperlipidemia, unspecified: Secondary | ICD-10-CM

## 2019-01-09 DIAGNOSIS — Z6832 Body mass index (BMI) 32.0-32.9, adult: Secondary | ICD-10-CM | POA: Diagnosis not present

## 2019-01-09 DIAGNOSIS — E6609 Other obesity due to excess calories: Secondary | ICD-10-CM | POA: Diagnosis not present

## 2019-01-10 LAB — CBC WITH DIFFERENTIAL/PLATELET
Absolute Monocytes: 533 cells/uL (ref 200–950)
Basophils Absolute: 60 cells/uL (ref 0–200)
Basophils Relative: 0.8 %
Eosinophils Absolute: 323 cells/uL (ref 15–500)
Eosinophils Relative: 4.3 %
HCT: 42.1 % (ref 38.5–50.0)
Hemoglobin: 14.2 g/dL (ref 13.2–17.1)
Lymphs Abs: 2138 cells/uL (ref 850–3900)
MCH: 32.9 pg (ref 27.0–33.0)
MCHC: 33.7 g/dL (ref 32.0–36.0)
MCV: 97.7 fL (ref 80.0–100.0)
MPV: 9.5 fL (ref 7.5–12.5)
Monocytes Relative: 7.1 %
Neutro Abs: 4448 cells/uL (ref 1500–7800)
Neutrophils Relative %: 59.3 %
Platelets: 247 10*3/uL (ref 140–400)
RBC: 4.31 10*6/uL (ref 4.20–5.80)
RDW: 12.3 % (ref 11.0–15.0)
Total Lymphocyte: 28.5 %
WBC: 7.5 10*3/uL (ref 3.8–10.8)

## 2019-01-10 LAB — COMPLETE METABOLIC PANEL WITH GFR
AG Ratio: 1.6 (calc) (ref 1.0–2.5)
ALT: 12 U/L (ref 9–46)
AST: 15 U/L (ref 10–35)
Albumin: 4.1 g/dL (ref 3.6–5.1)
Alkaline phosphatase (APISO): 42 U/L (ref 35–144)
BUN: 14 mg/dL (ref 7–25)
CO2: 29 mmol/L (ref 20–32)
Calcium: 9 mg/dL (ref 8.6–10.3)
Chloride: 105 mmol/L (ref 98–110)
Creat: 1.07 mg/dL (ref 0.70–1.11)
GFR, Est African American: 73 mL/min/{1.73_m2} (ref >=60)
GFR, Est Non African American: 63 mL/min/{1.73_m2} (ref >=60)
Globulin: 2.5 g/dL (calc) (ref 1.9–3.7)
Glucose, Bld: 99 mg/dL (ref 65–99)
Potassium: 4.4 mmol/L (ref 3.5–5.3)
Sodium: 140 mmol/L (ref 135–146)
Total Bilirubin: 0.5 mg/dL (ref 0.2–1.2)
Total Protein: 6.6 g/dL (ref 6.1–8.1)

## 2019-01-10 LAB — HEMOGLOBIN A1C
Hgb A1c MFr Bld: 5.7 % of total Hgb — ABNORMAL HIGH (ref <5.7)
Mean Plasma Glucose: 117 (calc)
eAG (mmol/L): 6.5 (calc)

## 2019-01-10 LAB — LIPID PANEL
Cholesterol: 190 mg/dL (ref <200)
HDL: 37 mg/dL — ABNORMAL LOW (ref >=40)
LDL Cholesterol (Calc): 133 mg/dL (calc) — ABNORMAL HIGH
Non-HDL Cholesterol (Calc): 153 mg/dL (calc) — ABNORMAL HIGH (ref <130)
Total CHOL/HDL Ratio: 5.1 (calc) — ABNORMAL HIGH (ref <5.0)
Triglycerides: 94 mg/dL (ref <150)

## 2019-01-15 ENCOUNTER — Encounter: Payer: Medicare HMO | Admitting: Family

## 2019-01-18 ENCOUNTER — Other Ambulatory Visit: Payer: Self-pay

## 2019-01-18 ENCOUNTER — Ambulatory Visit (INDEPENDENT_AMBULATORY_CARE_PROVIDER_SITE_OTHER): Payer: Medicare HMO | Admitting: Internal Medicine

## 2019-01-18 ENCOUNTER — Encounter: Payer: Self-pay | Admitting: Internal Medicine

## 2019-01-18 VITALS — BP 142/72 | HR 63 | Temp 98.1°F | Ht 70.0 in | Wt 212.8 lb

## 2019-01-18 DIAGNOSIS — E6609 Other obesity due to excess calories: Secondary | ICD-10-CM

## 2019-01-18 DIAGNOSIS — Z0000 Encounter for general adult medical examination without abnormal findings: Secondary | ICD-10-CM | POA: Diagnosis not present

## 2019-01-18 DIAGNOSIS — Z6830 Body mass index (BMI) 30.0-30.9, adult: Secondary | ICD-10-CM

## 2019-01-18 DIAGNOSIS — R7303 Prediabetes: Secondary | ICD-10-CM | POA: Diagnosis not present

## 2019-01-18 DIAGNOSIS — I509 Heart failure, unspecified: Secondary | ICD-10-CM

## 2019-01-18 DIAGNOSIS — Z6832 Body mass index (BMI) 32.0-32.9, adult: Secondary | ICD-10-CM

## 2019-01-18 DIAGNOSIS — H6123 Impacted cerumen, bilateral: Secondary | ICD-10-CM

## 2019-01-18 DIAGNOSIS — Z23 Encounter for immunization: Secondary | ICD-10-CM

## 2019-01-18 DIAGNOSIS — E785 Hyperlipidemia, unspecified: Secondary | ICD-10-CM

## 2019-01-18 NOTE — Progress Notes (Signed)
Location:  [facility]   Advanced Directives 09/12/2018  Does Patient Have a Medical Advance Directive? Yes  Type of Paramedic of [employer];Living will;Out of facility DNR (pink MOST or yellow form)  Does patient want to make changes to medical advance directive? No - Patient declined  Copy of [place] in Chart? -     Chief Complaint  Patient presents with  . Annual Exam    CPE with EKG and followup of fasting labs.  . Immunizations    Flu shot given today.  Due for tetanus.     HPI: Patient is a 85 y.o. male seen today for his annual physical exam. .     He has been doing well, but claims he is less social due to covid. He is trying to keep busy and stay active with chores around the house. He also talks on the phone to friends often.   Feels like he is getting older. He mowed the yard two days ago, was very tired the day after. He has noticed that when he exerts himself, the next day he just wants to sit. Expresses the need to lose weight, but finds it hard at his age. No injuries or falls reported. Ambulated without assistive device.   His diet consists of prepared meals that are typically frozen. Understands that these meals have a high amount of sodium in them, he tries to find ones that limit sodium. Drinks plenty of water daily.   Takes bp meds daily. Denies headache, dizziness, or blurred vision. He has a history of congestive heart failure. Denies any weight gain or shortness of breath.   He remains hard of hearing. He would like his ears checked for cerumen impaction.  Requesting flu vaccine  Has not had a dental or eye exam in over a year    Past Medical History:  Diagnosis Date  . Acquired deformity of nose   . Calculus of kidney   . Carotid artery disease ([place])   . Cellulitis and abscess of unspecified site   . Cervicalgia   . CHF (congestive heart failure) ([place])   . Coronary atherosclerosis of unspecified type of  vessel, native or graft   . Dizziness and giddiness   . Encounter for long-term (current) use of other medications   . Esophagitis, unspecified   . Hyperlipidemia   . Hypertrophy of prostate with urinary obstruction and other lower urinary tract symptoms (LUTS)   . Insomnia, unspecified   . Internal hemorrhoids without mention of complication   . Obesity, unspecified   . Old myocardial infarction   . Other and unspecified hyperlipidemia   . Other premature beats   . Palpitations   . 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 Shortness of breath   . Slowing of urinary stream   . Special screening for malignant neoplasm of prostate   . Unspecified essential hypertension   . 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 Unspecified vitamin D deficiency   . Urinary frequency     Past Surgical History:  Procedure Laterality Date  . CABG X 4  2000  . CARDIAC CATHETERIZATION    . CAROTID ENDARTERECTOMY Left 06-14-14   CE  . COLONOSCOPY  2007   Dr Lajoyce Corners, normal  . CORONARY ARTERY BYPASS GRAFT     2000  . ENDARTERECTOMY Left 06/14/2014   Procedure: LEFT CAROTID  ENDARTERECTOMY WITH VASCUGUARD PATCH ANGIOPLASTY;  Surgeon: Serafina Mitchell, MD;  Location: [facility];  Service: Vascular;  Laterality: Left;  . ESOPHAGOGASTRODUODENOSCOPY ENDOSCOPY  2007   Dr Lajoyce Corners, with biopsy  . EXCISIONAL HEMORRHOIDECTOMY  1981  . LESION FROM FOREHEAD,(R) EAR (L) ARM     DR Almira Coaster  . LITHOTRIPSY  2005   Dr Almira Coaster  . TONSILLECTOMY  1940  . trench in right side of jaw after abcesses  06/01/2016    No Known Allergies  Outpatient Encounter Medications as of 01/18/2019  Medication Sig  . b complex vitamins tablet Take 1 tablet by mouth daily.  . cholecalciferol (VITAMIN D) 1000 UNITS tablet Take 1,000 Units by mouth daily.   . Cinnamon 500 MG TABS Take 1 tablet by mouth daily.  Marland Kitchen CITRUS BERGAMOT PO Take 1 capsule by mouth daily.  Take one capsule once daily to lower cholesterol  . Coenzyme Q10 (CO Q 10) 100 MG CAPS Take 1 capsule by mouth daily.  . diazepam (VALIUM) 5 MG tablet TAKE 1/2 TABLET BY MOUTH TWICE DAILY  . doxepin (SINEQUAN) 50 MG capsule TAKE 1 CAPSULE(50 MG) BY MOUTH AT BEDTIME AS NEEDED FOR INSOMNIA OR ANXIETY  . fish oil-omega-3 fatty acids 1000 MG capsule Take 2 g by mouth daily. Take one tablet once a day for cholesterol  . Fluticasone-Salmeterol (ADVAIR) 250-50 MCG/DOSE AEPB Inhale 1 puff into the lungs daily.  . metoprolol succinate (TOPROL-XL) 50 MG 24 hr tablet TAKE 1 TABLET BY MOUTH ONCE DAILY TAKE WITH FOOD OR IMMEDIATELY FOLLOWING A MEAL  . Nutritional Supplements (NUTRITIONAL SUPPLEMENT PO) Ginger and Turmeric. Take 1 capsule by mouth daily.  . Potassium 99 MG TABS Take 1 tablet by mouth daily. Take one tablet once a day for cramps  . ramipril (ALTACE) 10 MG capsule TAKE 1 CAPSULE BY MOUTH EVERY DAY  . RESVERATROL 100 MG CAPS Take 1 capsule by mouth daily. Take one tablet once a day  . UNABLE TO FIND 1 capsule daily. Med Name: Norberto Sorenson Mediterranean Nutrient  . [DISCONTINUED] Fluticasone-Salmeterol (ADVAIR DISKUS) 250-50 MCG/DOSE AEPB inhale 1 puff by mouth twice a day   No facility-administered encounter medications on file as of 01/18/2019.     Review of Systems:  Review of Systems  Constitutional: Negative for activity change, appetite change and fatigue.  HENT: Positive for hearing loss and rhinorrhea. Negative for dental problem, sore throat and trouble swallowing.   Eyes: Negative for photophobia and visual disturbance.  Respiratory: Negative for cough, shortness of breath and wheezing.   Cardiovascular: Negative for chest pain, palpitations and leg swelling.  Gastrointestinal: Positive for constipation. Negative for abdominal pain, diarrhea and nausea.  Genitourinary: Positive for frequency. Negative for dysuria and hematuria.  Musculoskeletal: Negative for arthralgias, gait problem and  joint swelling.  Skin: Negative.   Neurological: Negative for dizziness, weakness and headaches.  Hematological: Bruises/bleeds easily.  Psychiatric/Behavioral: Negative for dysphoric mood. The patient is not nervous/anxious.     Health Maintenance  Topic Date Due  . TETANUS/TDAP  05/03/2014  . INFLUENZA VACCINE  12/02/2018  . PNA vac Low Risk Adult  Completed    Physical Exam: Vitals:   01/18/19 1519  BP: (!) 142/72  Pulse: 63  Temp: 98.1 F (36.7 C)  TempSrc: Oral  SpO2: 96%  Weight: 212 lb 12.8 oz (96.5 kg)  Height: 5\' 10"  (1.778 m)   Body mass index is 30.53 kg/m. Physical Exam Vitals signs reviewed.  Constitutional:      General: He is  not in acute distress.    Appearance: Normal appearance. He is normal weight.  HENT:     Head: Normocephalic.     Right Ear: There is impacted cerumen.     Left Ear: There is impacted cerumen.     Nose: Nose normal.     Mouth/Throat:     Mouth: Mucous membranes are dry.  Eyes:     Extraocular Movements: Extraocular movements intact.  Neck:     Musculoskeletal: Normal range of motion.     Vascular: No carotid bruit.  Cardiovascular:     Rate and Rhythm: Normal rate and regular rhythm.     Pulses: Normal pulses.     Heart sounds: Normal heart sounds. No murmur.  Pulmonary:     Effort: Pulmonary effort is normal. No respiratory distress.     Breath sounds: Normal breath sounds. No wheezing.  Abdominal:     General: Bowel sounds are normal.     Palpations: Abdomen is soft.     Tenderness: There is no abdominal tenderness.  Musculoskeletal: Normal range of motion.        General: No swelling or tenderness.     Right lower leg: No edema.     Left lower leg: No edema.  Lymphadenopathy:     Cervical: No cervical adenopathy.  Skin:    General: Skin is warm and dry.     Capillary Refill: Capillary refill takes less than 2 seconds.  Neurological:     General: No focal deficit present.     Mental Status: He is alert and  oriented to person, place, and time.     Sensory: No sensory deficit.     Motor: No weakness.     Coordination: Coordination normal.     Gait: Gait normal.  Psychiatric:        Mood and Affect: Mood normal.        Behavior: Behavior normal.        Thought Content: Thought content normal.        Judgment: Judgment normal.     Labs reviewed: Basic Metabolic Panel: Recent Labs    01/09/19 0824  NA 140  K 4.4  CL 105  CO2 29  GLUCOSE 99  BUN 14  CREATININE 1.07  CALCIUM 9.0   Liver Function Tests: Recent Labs    01/09/19 0824  AST 15  ALT 12  BILITOT 0.5  PROT 6.6   No results for input(s): LIPASE, AMYLASE in the last 8760 hours. No results for input(s): AMMONIA in the last 8760 hours. CBC: Recent Labs    01/09/19 0824  WBC 7.5  NEUTROABS 4,448  HGB 14.2  HCT 42.1  MCV 97.7  PLT 247   Lipid Panel: Recent Labs    01/09/19 0824  CHOL 190  HDL 37*  LDLCALC 133*  TRIG 94  CHOLHDL 5.1*   Lab Results  Component Value Date   HGBA1C 5.7 (H) 01/09/2019    Procedures since last visit: No results found.  Assessment/Plan 1. Other congestive heart failure (HCC) - EKG 12-Lead, results unremarkable - continue beta blocker and ACE inhibitor for cardiac protection - complete blood count with platelets- future  2. Need for influenza vaccination - administer Flu Vaccine QUAD High Dose(Fluad) vaccine  3. Bilateral hearing loss due to cerumen impaction - tympanic membranes in both ears not visible due to cerumen buildup - irrigate both ears - encourage purchasing debrox drops and using 1-2 times a month to prevent buildup  4.  Prediabetes -  hemoglobin A1c 5.7- unchanged  -hemoglobin A1c- future - complete metabolic panel with GFR- future - encourage limiting carbs and sugars in diet  5. Hyperlipidemia, unspecified hyperlipidemia type - LDL >137, not at goal of <100 - encourage diet low in fat and fried food - lipid panel- future  6. Class 1 obesity  due to excess calories with serious comorbidity and body mass index (BMI) of 30.0 to 30.9 in adult - weight in same range from last visit - he reports being more active with chores around house - encouraged to do light exercise 3-5 times a week - encourage improved diet that is low in sodium, sugar, carbs, and fats  Labs/tests ordered: complete metabolic panel with GFR, hemoglobin A1C, complete blood count with platelets, lipid panel Next appt:  Visit date not found

## 2019-01-19 NOTE — Addendum Note (Signed)
Addended by: Bonney Leitz T on: 01/19/2019 09:59 AM   Modules accepted: Orders

## 2019-02-20 ENCOUNTER — Other Ambulatory Visit: Payer: Self-pay | Admitting: *Deleted

## 2019-02-20 MED ORDER — DOXEPIN HCL 50 MG PO CAPS: ORAL_CAPSULE | ORAL | 0 refills | Status: DC

## 2019-02-20 NOTE — Telephone Encounter (Signed)
[facility]

## 2019-05-08 ENCOUNTER — Other Ambulatory Visit: Payer: Self-pay | Admitting: Internal Medicine

## 2019-05-08 DIAGNOSIS — F411 Generalized anxiety disorder: Secondary | ICD-10-CM

## 2019-05-08 NOTE — Telephone Encounter (Signed)
Last filled 01/28/2019

## 2019-05-08 NOTE — Telephone Encounter (Signed)
Needs non-opioid controlled substance agreement updated.  Will renew med today.

## 2019-05-22 ENCOUNTER — Other Ambulatory Visit: Payer: Self-pay | Admitting: Internal Medicine

## 2019-06-04 DIAGNOSIS — I252 Old myocardial infarction: Secondary | ICD-10-CM

## 2019-06-04 HISTORY — DX: Old myocardial infarction: I25.2

## 2019-06-22 ENCOUNTER — Encounter (HOSPITAL_COMMUNITY): Payer: Self-pay | Admitting: Cardiology

## 2019-06-22 ENCOUNTER — Emergency Department (HOSPITAL_COMMUNITY): Payer: Medicare HMO

## 2019-06-22 ENCOUNTER — Inpatient Hospital Stay (HOSPITAL_COMMUNITY): Payer: Medicare HMO

## 2019-06-22 ENCOUNTER — Other Ambulatory Visit: Payer: Self-pay

## 2019-06-22 ENCOUNTER — Inpatient Hospital Stay (HOSPITAL_COMMUNITY)
Admission: EM | Admit: 2019-06-22 | Discharge: 2019-06-25 | DRG: 247 | Disposition: A | Discharge status: Home / Self Care | Payer: Medicare HMO | Attending: Cardiology | Admitting: Cardiology

## 2019-06-22 ENCOUNTER — Encounter (HOSPITAL_COMMUNITY)
Admission: EM | Disposition: A | Discharge status: Home / Self Care | Payer: Self-pay | Source: Home / Self Care | Attending: Cardiology

## 2019-06-22 DIAGNOSIS — E559 Vitamin D deficiency, unspecified: Secondary | ICD-10-CM | POA: Diagnosis present

## 2019-06-22 DIAGNOSIS — I2121 ST elevation (STEMI) myocardial infarction involving left circumflex coronary artery: Secondary | ICD-10-CM | POA: Diagnosis not present

## 2019-06-22 DIAGNOSIS — I2129 ST elevation (STEMI) myocardial infarction involving other sites: Secondary | ICD-10-CM | POA: Diagnosis present

## 2019-06-22 DIAGNOSIS — Z951 Presence of aortocoronary bypass graft: Secondary | ICD-10-CM | POA: Diagnosis not present

## 2019-06-22 DIAGNOSIS — I2111 ST elevation (STEMI) myocardial infarction involving right coronary artery: Secondary | ICD-10-CM | POA: Diagnosis present

## 2019-06-22 DIAGNOSIS — I340 Nonrheumatic mitral (valve) insufficiency: Secondary | ICD-10-CM | POA: Diagnosis not present

## 2019-06-22 DIAGNOSIS — G4700 Insomnia, unspecified: Secondary | ICD-10-CM | POA: Diagnosis present

## 2019-06-22 DIAGNOSIS — I6521 Occlusion and stenosis of right carotid artery: Secondary | ICD-10-CM | POA: Diagnosis present

## 2019-06-22 DIAGNOSIS — Z79899 Other long term (current) drug therapy: Secondary | ICD-10-CM | POA: Diagnosis not present

## 2019-06-22 DIAGNOSIS — Z955 Presence of coronary angioplasty implant and graft: Secondary | ICD-10-CM | POA: Diagnosis not present

## 2019-06-22 DIAGNOSIS — R0902 Hypoxemia: Secondary | ICD-10-CM | POA: Diagnosis not present

## 2019-06-22 DIAGNOSIS — I25119 Atherosclerotic heart disease of native coronary artery with unspecified angina pectoris: Secondary | ICD-10-CM | POA: Diagnosis present

## 2019-06-22 DIAGNOSIS — R079 Chest pain, unspecified: Secondary | ICD-10-CM | POA: Diagnosis not present

## 2019-06-22 DIAGNOSIS — I25110 Atherosclerotic heart disease of native coronary artery with unstable angina pectoris: Secondary | ICD-10-CM | POA: Diagnosis not present

## 2019-06-22 DIAGNOSIS — E785 Hyperlipidemia, unspecified: Secondary | ICD-10-CM | POA: Diagnosis not present

## 2019-06-22 DIAGNOSIS — R0789 Other chest pain: Secondary | ICD-10-CM | POA: Diagnosis not present

## 2019-06-22 DIAGNOSIS — I255 Ischemic cardiomyopathy: Secondary | ICD-10-CM | POA: Diagnosis not present

## 2019-06-22 DIAGNOSIS — I213 ST elevation (STEMI) myocardial infarction of unspecified site: Secondary | ICD-10-CM

## 2019-06-22 DIAGNOSIS — I252 Old myocardial infarction: Secondary | ICD-10-CM

## 2019-06-22 DIAGNOSIS — Z87891 Personal history of nicotine dependence: Secondary | ICD-10-CM

## 2019-06-22 DIAGNOSIS — F419 Anxiety disorder, unspecified: Secondary | ICD-10-CM | POA: Diagnosis present

## 2019-06-22 DIAGNOSIS — I10 Essential (primary) hypertension: Secondary | ICD-10-CM | POA: Diagnosis present

## 2019-06-22 DIAGNOSIS — Z20822 Contact with and (suspected) exposure to covid-19: Secondary | ICD-10-CM | POA: Diagnosis present

## 2019-06-22 DIAGNOSIS — I2119 ST elevation (STEMI) myocardial infarction involving other coronary artery of inferior wall: Principal | ICD-10-CM | POA: Diagnosis present

## 2019-06-22 DIAGNOSIS — R9431 Abnormal electrocardiogram [ECG] [EKG]: Secondary | ICD-10-CM | POA: Diagnosis not present

## 2019-06-22 DIAGNOSIS — I219 Acute myocardial infarction, unspecified: Secondary | ICD-10-CM

## 2019-06-22 DIAGNOSIS — Z7951 Long term (current) use of inhaled steroids: Secondary | ICD-10-CM

## 2019-06-22 DIAGNOSIS — E782 Mixed hyperlipidemia: Secondary | ICD-10-CM

## 2019-06-22 DIAGNOSIS — I2582 Chronic total occlusion of coronary artery: Secondary | ICD-10-CM | POA: Diagnosis not present

## 2019-06-22 DIAGNOSIS — I25720 Atherosclerosis of autologous artery coronary artery bypass graft(s) with unstable angina pectoris: Secondary | ICD-10-CM | POA: Diagnosis present

## 2019-06-22 HISTORY — DX: Occlusion and stenosis of left carotid artery: I65.22

## 2019-06-22 HISTORY — DX: Presence of coronary angioplasty implant and graft: Z95.5

## 2019-06-22 HISTORY — PX: LEFT HEART CATH AND CORS/GRAFTS ANGIOGRAPHY: CATH118250

## 2019-06-22 HISTORY — PX: CORONARY/GRAFT ACUTE MI REVASCULARIZATION: CATH118305

## 2019-06-22 HISTORY — PX: CORONARY STENT INTERVENTION: CATH118234

## 2019-06-22 HISTORY — DX: Old myocardial infarction: I25.2

## 2019-06-22 LAB — COMPREHENSIVE METABOLIC PANEL
ALT: 17 U/L (ref 0–44)
AST: 21 U/L (ref 15–41)
Albumin: 3.5 g/dL (ref 3.5–5.0)
Alkaline Phosphatase: 35 U/L — ABNORMAL LOW (ref 38–126)
Anion gap: 11 (ref 5–15)
BUN: 15 mg/dL (ref 8–23)
CO2: 24 mmol/L (ref 22–32)
Calcium: 8.7 mg/dL — ABNORMAL LOW (ref 8.9–10.3)
Chloride: 105 mmol/L (ref 98–111)
Creatinine, Ser: 1.14 mg/dL (ref 0.61–1.24)
GFR calc Af Amer: >60 mL/min (ref >60)
GFR calc non Af Amer: 58 mL/min — ABNORMAL LOW (ref >60)
Glucose, Bld: 146 mg/dL — ABNORMAL HIGH (ref 70–99)
Potassium: 3.5 mmol/L (ref 3.5–5.1)
Sodium: 140 mmol/L (ref 135–145)
Total Bilirubin: 0.8 mg/dL (ref 0.3–1.2)
Total Protein: 6.6 g/dL (ref 6.5–8.1)

## 2019-06-22 LAB — LIPID PANEL
Cholesterol: 194 mg/dL (ref 0–200)
HDL: 43 mg/dL (ref >40)
LDL Cholesterol: 143 mg/dL — ABNORMAL HIGH (ref 0–99)
Total CHOL/HDL Ratio: 4.5 RATIO
Triglycerides: 41 mg/dL (ref <150)
VLDL: 8 mg/dL (ref 0–40)

## 2019-06-22 LAB — CBC WITH DIFFERENTIAL/PLATELET
Abs Immature Granulocytes: 0.05 10*3/uL (ref 0.00–0.07)
Basophils Absolute: 0.1 10*3/uL (ref 0.0–0.1)
Basophils Relative: 1 %
Eosinophils Absolute: 0.1 10*3/uL (ref 0.0–0.5)
Eosinophils Relative: 2 %
HCT: 43.5 % (ref 39.0–52.0)
Hemoglobin: 14 g/dL (ref 13.0–17.0)
Immature Granulocytes: 1 %
Lymphocytes Relative: 19 %
Lymphs Abs: 1.8 10*3/uL (ref 0.7–4.0)
MCH: 32.7 pg (ref 26.0–34.0)
MCHC: 32.2 g/dL (ref 30.0–36.0)
MCV: 101.6 fL — ABNORMAL HIGH (ref 80.0–100.0)
Monocytes Absolute: 0.5 10*3/uL (ref 0.1–1.0)
Monocytes Relative: 5 %
Neutro Abs: 7.1 10*3/uL (ref 1.7–7.7)
Neutrophils Relative %: 72 %
Platelets: 218 10*3/uL (ref 150–400)
RBC: 4.28 MIL/uL (ref 4.22–5.81)
RDW: 13.4 % (ref 11.5–15.5)
WBC: 9.6 10*3/uL (ref 4.0–10.5)
nRBC: 0 % (ref 0.0–0.2)

## 2019-06-22 LAB — POCT ACTIVATED CLOTTING TIME
Activated Clotting Time: 296 seconds
Activated Clotting Time: 373 seconds
Activated Clotting Time: 241 seconds

## 2019-06-22 LAB — PROTIME-INR
INR: 1 (ref 0.8–1.2)
Prothrombin Time: 13 seconds (ref 11.4–15.2)

## 2019-06-22 LAB — RESPIRATORY PANEL BY RT PCR (FLU A&B, COVID)
Influenza A by PCR: NEGATIVE
Influenza B by PCR: NEGATIVE
SARS Coronavirus 2 by RT PCR: NEGATIVE

## 2019-06-22 LAB — TROPONIN I (HIGH SENSITIVITY)
Troponin I (High Sensitivity): 4 ng/L (ref <18)
Troponin I (High Sensitivity): 393 ng/L (ref <18)
Troponin I (High Sensitivity): 9320 ng/L (ref <18)

## 2019-06-22 LAB — POCT I-STAT, CHEM 8
BUN: 15 mg/dL (ref 8–23)
Calcium, Ion: 1.15 mmol/L (ref 1.15–1.40)
Chloride: 105 mmol/L (ref 98–111)
Creatinine, Ser: 0.9 mg/dL (ref 0.61–1.24)
Glucose, Bld: 142 mg/dL — ABNORMAL HIGH (ref 70–99)
HCT: 38 % — ABNORMAL LOW (ref 39.0–52.0)
Hemoglobin: 12.9 g/dL — ABNORMAL LOW (ref 13.0–17.0)
Potassium: 3.8 mmol/L (ref 3.5–5.1)
Sodium: 142 mmol/L (ref 135–145)
TCO2: 25 mmol/L (ref 22–32)

## 2019-06-22 LAB — HEMOGLOBIN A1C
Hgb A1c MFr Bld: 5.7 % — ABNORMAL HIGH (ref 4.8–5.6)
Mean Plasma Glucose: 116.89 mg/dL

## 2019-06-22 LAB — APTT: aPTT: 25 seconds (ref 24–36)

## 2019-06-22 SURGERY — CORONARY/GRAFT ACUTE MI REVASCULARIZATION
Anesthesia: LOCAL

## 2019-06-22 MED ORDER — MORPHINE SULFATE (PF) 2 MG/ML IV SOLN: 2.0000 mg | INTRAVENOUS | Status: DC | PRN

## 2019-06-22 MED ORDER — SODIUM CHLORIDE 0.9 % IV SOLN
INTRAVENOUS | Status: AC | PRN
  Administered 2019-06-22: 20 mL/h via INTRAVENOUS

## 2019-06-22 MED ORDER — DIAZEPAM 5 MG PO TABS
2.5000 mg | ORAL_TABLET | Freq: Two times a day (BID) | ORAL | Status: DC
  Administered 2019-06-22 – 2019-06-25 (×5): 2.5 mg via ORAL
  Filled 2019-06-22 (×5): qty 1

## 2019-06-22 MED ORDER — ADENOSINE 12 MG/4ML IV SOLN
INTRAVENOUS | Status: AC
  Filled 2019-06-22: qty 4

## 2019-06-22 MED ORDER — MIDAZOLAM HCL 2 MG/2ML IJ SOLN
INTRAMUSCULAR | Status: DC | PRN
  Administered 2019-06-22 (×3): 1 mg via INTRAVENOUS

## 2019-06-22 MED ORDER — HEPARIN SODIUM (PORCINE) 5000 UNIT/ML IJ SOLN: 4000.0000 [IU] | Freq: Once | INTRAMUSCULAR | Status: DC

## 2019-06-22 MED ORDER — SODIUM CHLORIDE 0.9 % IV SOLN: 250.0000 mL | INTRAVENOUS | Status: DC | PRN

## 2019-06-22 MED ORDER — TIROFIBAN HCL IN NACL 5-0.9 MG/100ML-% IV SOLN
INTRAVENOUS | Status: AC
  Filled 2019-06-22: qty 100

## 2019-06-22 MED ORDER — ADENOSINE 6 MG/2ML IV SOLN
INTRAVENOUS | Status: AC
  Filled 2019-06-22: qty 2

## 2019-06-22 MED ORDER — LABETALOL HCL 5 MG/ML IV SOLN: 10.0000 mg | INTRAVENOUS | Status: AC | PRN

## 2019-06-22 MED ORDER — ASPIRIN 81 MG PO CHEW: 324.0000 mg | CHEWABLE_TABLET | Freq: Once | ORAL | Status: DC

## 2019-06-22 MED ORDER — TICAGRELOR 90 MG PO TABS
ORAL_TABLET | ORAL | Status: AC
  Filled 2019-06-22: qty 1

## 2019-06-22 MED ORDER — SODIUM CHLORIDE 0.9% FLUSH: 3.0000 mL | INTRAVENOUS | Status: DC | PRN

## 2019-06-22 MED ORDER — ONDANSETRON HCL 4 MG/2ML IJ SOLN
INTRAMUSCULAR | Status: AC
  Administered 2019-06-22: 12:00:00 4 mg via INTRAVENOUS
  Filled 2019-06-22: qty 2

## 2019-06-22 MED ORDER — MIDAZOLAM HCL 2 MG/2ML IJ SOLN
INTRAMUSCULAR | Status: AC
  Filled 2019-06-22: qty 2

## 2019-06-22 MED ORDER — FENTANYL CITRATE (PF) 100 MCG/2ML IJ SOLN
INTRAMUSCULAR | Status: AC
  Filled 2019-06-22: qty 2

## 2019-06-22 MED ORDER — ADENOSINE (DIAGNOSTIC) FOR INTRACORONARY USE
INTRAVENOUS | Status: DC | PRN
  Administered 2019-06-22 (×2): 30 ug via INTRACORONARY

## 2019-06-22 MED ORDER — NITROGLYCERIN 1 MG/10 ML FOR IR/CATH LAB
INTRA_ARTERIAL | Status: DC | PRN
  Administered 2019-06-22 (×2): 200 ug via INTRACORONARY

## 2019-06-22 MED ORDER — ONDANSETRON HCL 4 MG/2ML IJ SOLN: 4.0000 mg | Freq: Four times a day (QID) | INTRAMUSCULAR | Status: DC | PRN

## 2019-06-22 MED ORDER — METOPROLOL SUCCINATE ER 50 MG PO TB24
50.0000 mg | ORAL_TABLET | Freq: Every day | ORAL | Status: DC
  Administered 2019-06-22 – 2019-06-25 (×4): 50 mg via ORAL
  Filled 2019-06-22 (×4): qty 1

## 2019-06-22 MED ORDER — HEPARIN SODIUM (PORCINE) 1000 UNIT/ML IJ SOLN
INTRAMUSCULAR | Status: DC | PRN
  Administered 2019-06-22: 10000 [IU] via INTRAVENOUS

## 2019-06-22 MED ORDER — TIROFIBAN HCL IN NACL 5-0.9 MG/100ML-% IV SOLN
INTRAVENOUS | Status: DC | PRN
  Administered 2019-06-22: 0.15 ug/kg/min via INTRAVENOUS

## 2019-06-22 MED ORDER — HYDRALAZINE HCL 20 MG/ML IJ SOLN: 10.0000 mg | INTRAMUSCULAR | Status: AC | PRN

## 2019-06-22 MED ORDER — SODIUM CHLORIDE 0.9% FLUSH
3.0000 mL | Freq: Two times a day (BID) | INTRAVENOUS | Status: DC
  Administered 2019-06-22 – 2019-06-24 (×7): 3 mL via INTRAVENOUS

## 2019-06-22 MED ORDER — HEPARIN (PORCINE) IN NACL 1000-0.9 UT/500ML-% IV SOLN
INTRAVENOUS | Status: AC
  Filled 2019-06-22: qty 1000

## 2019-06-22 MED ORDER — TIROFIBAN (AGGRASTAT) BOLUS VIA INFUSION
INTRAVENOUS | Status: DC | PRN
  Administered 2019-06-22: 2382.5 ug via INTRAVENOUS

## 2019-06-22 MED ORDER — VITAMIN D 25 MCG (1000 UNIT) PO TABS
1000.0000 [IU] | ORAL_TABLET | Freq: Every day | ORAL | Status: DC
  Administered 2019-06-22 – 2019-06-25 (×4): 1000 [IU] via ORAL
  Filled 2019-06-22 (×4): qty 1

## 2019-06-22 MED ORDER — HEPARIN SODIUM (PORCINE) 5000 UNIT/ML IJ SOLN
INTRAMUSCULAR | Status: AC
  Filled 2019-06-22: qty 1

## 2019-06-22 MED ORDER — SODIUM CHLORIDE 0.9 % IV SOLN: INTRAVENOUS | Status: AC

## 2019-06-22 MED ORDER — HEPARIN SODIUM (PORCINE) 1000 UNIT/ML IJ SOLN
4000.0000 [IU] | Freq: Once | INTRAMUSCULAR | Status: AC
  Administered 2019-06-22: 07:00:00 4000 [IU] via INTRAVENOUS

## 2019-06-22 MED ORDER — HEPARIN SODIUM (PORCINE) 5000 UNIT/ML IJ SOLN
5000.0000 [IU] | Freq: Three times a day (TID) | INTRAMUSCULAR | Status: DC
  Administered 2019-06-22 – 2019-06-25 (×8): 5000 [IU] via SUBCUTANEOUS
  Filled 2019-06-22 (×8): qty 1

## 2019-06-22 MED ORDER — FENTANYL CITRATE (PF) 100 MCG/2ML IJ SOLN
INTRAMUSCULAR | Status: DC | PRN
  Administered 2019-06-22: 25 ug via INTRAVENOUS
  Administered 2019-06-22: 50 ug via INTRAVENOUS
  Administered 2019-06-22 (×3): 25 ug via INTRAVENOUS

## 2019-06-22 MED ORDER — LIDOCAINE HCL (PF) 1 % IJ SOLN
INTRAMUSCULAR | Status: DC | PRN
  Administered 2019-06-22: 15 mL

## 2019-06-22 MED ORDER — TICAGRELOR 90 MG PO TABS
ORAL_TABLET | ORAL | Status: DC | PRN
  Administered 2019-06-22: 180 mg via ORAL

## 2019-06-22 MED ORDER — ASPIRIN 81 MG PO CHEW
81.0000 mg | CHEWABLE_TABLET | Freq: Every day | ORAL | Status: DC
  Administered 2019-06-23 – 2019-06-25 (×3): 81 mg via ORAL
  Filled 2019-06-22 (×3): qty 1

## 2019-06-22 MED ORDER — HEPARIN (PORCINE) IN NACL 1000-0.9 UT/500ML-% IV SOLN
INTRAVENOUS | Status: DC | PRN
  Administered 2019-06-22 (×2): 500 mL

## 2019-06-22 MED ORDER — ACETAMINOPHEN 325 MG PO TABS
650.0000 mg | ORAL_TABLET | ORAL | Status: DC | PRN
  Administered 2019-06-22: 15:00:00 650 mg via ORAL
  Filled 2019-06-22: qty 2

## 2019-06-22 MED ORDER — ATROPINE SULFATE 1 MG/10ML IJ SOSY
PREFILLED_SYRINGE | INTRAMUSCULAR | Status: AC
  Filled 2019-06-22: qty 10

## 2019-06-22 MED ORDER — TICAGRELOR 90 MG PO TABS
90.0000 mg | ORAL_TABLET | Freq: Two times a day (BID) | ORAL | Status: DC
  Administered 2019-06-22 – 2019-06-25 (×6): 90 mg via ORAL
  Filled 2019-06-22 (×6): qty 1

## 2019-06-22 MED ORDER — LIDOCAINE HCL (PF) 1 % IJ SOLN
INTRAMUSCULAR | Status: AC
  Filled 2019-06-22: qty 30

## 2019-06-22 MED ORDER — ATORVASTATIN CALCIUM 80 MG PO TABS
80.0000 mg | ORAL_TABLET | Freq: Every day | ORAL | Status: DC
  Administered 2019-06-22 – 2019-06-24 (×3): 80 mg via ORAL
  Filled 2019-06-22 (×3): qty 1

## 2019-06-22 MED ORDER — MOMETASONE FURO-FORMOTEROL FUM 200-5 MCG/ACT IN AERO
2.0000 | INHALATION_SPRAY | Freq: Two times a day (BID) | RESPIRATORY_TRACT | Status: DC
  Administered 2019-06-22 – 2019-06-25 (×5): 2 via RESPIRATORY_TRACT
  Filled 2019-06-22 (×2): qty 8.8

## 2019-06-22 MED ORDER — SODIUM CHLORIDE 0.9 % IV SOLN
INTRAVENOUS | Status: DC
  Administered 2019-06-22: 21:00:00 10 mL/h via INTRAVENOUS
  Administered 2019-06-23: 16:00:00 10 mL via INTRAVENOUS

## 2019-06-22 MED ORDER — NITROGLYCERIN 1 MG/10 ML FOR IR/CATH LAB
INTRA_ARTERIAL | Status: AC
  Filled 2019-06-22: qty 10

## 2019-06-22 MED ORDER — NITROGLYCERIN 0.4 MG SL SUBL
SUBLINGUAL_TABLET | SUBLINGUAL | Status: AC
  Filled 2019-06-22: qty 3

## 2019-06-22 MED ORDER — IOHEXOL 350 MG/ML SOLN
INTRAVENOUS | Status: DC | PRN
  Administered 2019-06-22: 275 mL

## 2019-06-22 SURGICAL SUPPLY — 24 items
BALLN SAPPHIRE 2.5X15 (BALLOONS) ×2
BALLN SAPPHIRE 2.5X20 (BALLOONS) ×2
BALLN SAPPHIRE ~~LOC~~ 3.0X18 (BALLOONS) ×2 IMPLANT
BALLOON SAPPHIRE 2.5X15 (BALLOONS) ×1 IMPLANT
BALLOON SAPPHIRE 2.5X20 (BALLOONS) ×1 IMPLANT
CATH EXTRAC PRONTO LP 6F RND (CATHETERS) ×2 IMPLANT
CATH INFINITI 5 FR IM (CATHETERS) ×2 IMPLANT
CATH INFINITI 5FR ANG PIGTAIL (CATHETERS) ×2 IMPLANT
CATH INFINITI 5FR MULTPACK ANG (CATHETERS) ×2 IMPLANT
CATH VISTA GUIDE 6FR JR4 (CATHETERS) ×2 IMPLANT
CATH VISTA GUIDE 6FR XB3.5 (CATHETERS) ×2 IMPLANT
GLIDESHEATH SLEND SS 6F .021 (SHEATH) ×2 IMPLANT
KIT ENCORE 26 ADVANTAGE (KITS) ×2 IMPLANT
KIT HEART LEFT (KITS) ×2 IMPLANT
PACK CARDIAC CATHETERIZATION (CUSTOM PROCEDURE TRAY) ×2 IMPLANT
SHEATH PINNACLE 6F 10CM (SHEATH) ×2 IMPLANT
SHEATH PROBE COVER 6X72 (BAG) ×2 IMPLANT
STENT SYNERGY XD 2.50X38 (Permanent Stent) ×1 IMPLANT
SYNERGY XD 2.50X38 (Permanent Stent) ×2 IMPLANT
TRANSDUCER W/STOPCOCK (MISCELLANEOUS) ×2 IMPLANT
TUBING CIL FLEX 10 FLL-RA (TUBING) ×2 IMPLANT
WIRE ASAHI PROWATER 180CM (WIRE) ×4 IMPLANT
WIRE EMERALD 3MM-J .035X150CM (WIRE) ×2 IMPLANT
WIRE HI TORQ VERSACORE-J 145CM (WIRE) ×2 IMPLANT

## 2019-06-22 NOTE — Progress Notes (Signed)
CRITICAL VALUE ALERT  Critical Value:  Troponin I 9,320  Date & Time Notied:  1730  Provider Notified:  No  Orders Received/Actions taken: Expected value

## 2019-06-22 NOTE — Plan of Care (Signed)
Patient admitted for STEMI.  Taken to Cath Lab and Stent placed.  Also had a balloon angioplasty done as well.  All questions answered and support given

## 2019-06-22 NOTE — ED Triage Notes (Signed)
Pt in via [facility] with central cp that woke him up around 0600. Pain is sharp, NR. Denies any n/v or sob. Given 324mg  ASA and 2 NTG en route, pain remains 8/10. STEMI called on arrival

## 2019-06-22 NOTE — H&P (Signed)
Cardiology Admission History and Physical:   Patient ID: Alex [name] MRN: [id number]; DOB: January 07, 1932   Admission date: 06/22/2019  Primary Care Provider: Gayland Curry, DO Primary Cardiologist: No primary care provider on file. Dr. Gwenlyn Found Primary Electrophysiologist:  None n/a Vascular Surgeon: Dr. Trula Slade.  Chief Complaint: Chest pain (inferior-posterior STEMI)  Patient Profile:   Alex [name] is a 86 y.o. male with distant history of multivessel CAD with CABG in 2000 by Dr. Roxan Hockey (at the time of this initial H&P, this data was not known) as well as known left carotid artery stenosis status post CEA who presented to [facility] with severe chest pain of roughly 2-hour duration and was found to have inferior and posterior ST elevations on EKG.  Due to astute identification of posterior STEMI on the initial EKG, Dr. Alvino Chapel called code STEMI at roughly 7:30 a.m.  History of Present Illness:   Alex [name] had been doing well, in his usual state of health with no major issues having been lost to follow-up from cardiology (last seen in 2016).  He went to bed last night with no major issues, however woke up somewhere between 4 and 4:30 in the morning with severe chest discomfort that was described as a dullness in his mid chest.  It then became more like a pressure sensation radiating to both shoulders.  This chest discomfort was reminiscent of his MI that he had pre-CABG.  Besides some expected dyspnea and anxiety, he denies any nausea vomiting or diaphoresis.  He denies any antecedent rapid irregular beats palpitations.  No PND, orthopnea or edema.  No syncope or near syncope.  Prior to this morning, he did not have any chest pain with rest or exertion.  Heart Pathway Score:     Past Medical History:  Diagnosis Date  . Acquired deformity of nose   . Calculus of kidney   . Cellulitis and abscess of unspecified site   . Cervicalgia   . CHF (congestive heart failure) ([place])   .  Dizziness and giddiness   . Encounter for long-term (current) use of other medications   . Esophagitis, unspecified   . Hyperlipidemia   . Hypertrophy of prostate with urinary obstruction and other lower urinary tract symptoms (LUTS)   . Insomnia, unspecified   . Internal hemorrhoids without mention of complication   . Left carotid artery stenosis    S/P L CEA - 2015 (Dr. Trula Slade)  . Multiple vessel coronary artery disease 2000   -> CABG X 4 (LIMA-LAD, RIMA-dRCA, SVG-OM2, SVG-RI) (Dr. Roxan Hockey)  . Obesity, unspecified   . Old myocardial infarction   . Other and unspecified hyperlipidemia   . Palpitations   . 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 Slowing of urinary stream   . Special screening for malignant neoplasm of prostate   . Unspecified essential hypertension   . 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 Unspecified vitamin D deficiency   . Urinary frequency     Past Surgical History:  Procedure Laterality Date  . CARDIAC CATHETERIZATION    . CAROTID ENDARTERECTOMY Left 06-14-14   CE  . COLONOSCOPY  2007   Dr Lajoyce Corners, normal  . CORONARY ARTERY BYPASS GRAFT  2000   Dr. Roxan Hockey - CABG x 4:  LIMA-LAD, RIMA-dRCA, SVG-OM2, SVG-RI  . ENDARTERECTOMY Left 06/14/2014   Procedure: LEFT CAROTID ENDARTERECTOMY WITH VASCUGUARD PATCH ANGIOPLASTY;  Surgeon: Durene Fruits  Pierre Bali, MD;  Location: [facility];  Service: Vascular;  Laterality: Left;  . ESOPHAGOGASTRODUODENOSCOPY ENDOSCOPY  2007   Dr Lajoyce Corners, with biopsy  . EXCISIONAL HEMORRHOIDECTOMY  1981  . LESION FROM FOREHEAD,(R) EAR (L) ARM     DR Almira Coaster  . LITHOTRIPSY  2005   Dr Almira Coaster  . TONSILLECTOMY  1940  . trench in right side of jaw after abcesses  06/01/2016     Medications Prior to Admission: Prior to Admission medications   Medication Sig Start Date End Date Taking? Authorizing Provider  b complex vitamins tablet Take 1 tablet by mouth  daily.    [provider]  cholecalciferol (VITAMIN D) 1000 UNITS tablet Take 1,000 Units by mouth daily.     [provider]  Cinnamon 500 MG TABS Take 1 tablet by mouth daily.    [provider]  CITRUS BERGAMOT PO Take 1 capsule by mouth daily. Take one capsule once daily to lower cholesterol    [provider]  Coenzyme Q10 (CO Q 10) 100 MG CAPS Take 1 capsule by mouth daily.    [provider]  diazepam (VALIUM) 5 MG tablet TAKE 1/2 TABLET BY MOUTH TWICE DAILY 05/08/19   Reed, Tiffany L, DO  doxepin (SINEQUAN) 50 MG capsule TAKE 1 CAPSULE BY MOUTH AT BEDTIME AS NEEDED FOR INSOMNIA OR ANXIETY 05/22/19   Reed, Tiffany L, DO  fish oil-omega-3 fatty acids 1000 MG capsule Take 2 g by mouth daily. Take one tablet once a day for cholesterol    [provider]  Fluticasone-Salmeterol (ADVAIR) 250-50 MCG/DOSE AEPB Inhale 1 puff into the lungs daily.    [provider]  metoprolol succinate (TOPROL-XL) 50 MG 24 hr tablet TAKE 1 TABLET BY MOUTH ONCE DAILY TAKE WITH FOOD OR IMMEDIATELY FOLLOWING A MEAL 12/19/18   Ngetich, Dinah C, NP  Nutritional Supplements (NUTRITIONAL SUPPLEMENT PO) Ginger and Turmeric. Take 1 capsule by mouth daily.    [provider]  Potassium 99 MG TABS Take 1 tablet by mouth daily. Take one tablet once a day for cramps    [provider]  ramipril (ALTACE) 10 MG capsule TAKE 1 CAPSULE BY MOUTH EVERY DAY 12/18/18   Reed, Tiffany L, DO  RESVERATROL 100 MG CAPS Take 1 capsule by mouth daily. Take one tablet once a day    [provider]  UNABLE TO FIND 1 capsule daily. Med Name: [facility] Nutrient    [provider]     Allergies:   No Known Allergies  Social History:   Social History   Social History Narrative  . Not on file   Social History   Tobacco Use  . Smoking status: Former Smoker    Packs/day: 2.00    Years: 25.00    Pack years: 50.00    Types: Cigarettes      Quit date: 05/03/1973    Years since quitting: 46.1  . Smokeless tobacco: Never Used  Substance Use Topics  . Alcohol use: Yes    Alcohol/week: 4.0 standard drinks    Types: 1 Glasses of wine, 3 Cans of beer per week  . Drug use: No   Family History:  The patient's family history includes Cancer in his brother, daughter, and mother.    ROS:  Please see the history of present illness.  Review of Systems - Negative except Chest pain and arm pain noted above.  He does have baseline arthritis type pains but no other major issues. No  nausea vomiting diarrhea loose stools.  No melena, hematochezia or hematuria epistaxis. All other ROS reviewed and negative.     Physical Exam/Data:   Vitals:   06/22/19 0920 06/22/19 0925 06/22/19 0930 06/22/19 0940  BP: 134/78 122/76 128/78 125/80  Pulse: 95 84 85 80  Resp: 17 17 12 20   SpO2: 98% 99% 95% 96%  Weight:      Height:       No intake or output data in the 24 hours ending 06/22/19 1050 Last 3 Weights 06/22/2019 01/18/2019 11/27/2018  Weight (lbs) 210 lb 212 lb 12.8 oz 214 lb  Weight (kg) 95.255 kg 96.525 kg 97.07 kg     Body mass index is 29.71 kg/m.  General: Obese elderly gentleman.  While nourished and well groomed.  In moderate distress. HEENT: [place]/AT, EOMI.  Wears glasses. Neck: no visible JVD with soft carotid bruits. Cardiac: Distant S1, S2; RRR; no audible M/R/G. Lungs: Nonlabored with good air movement.  No obvious wheezes rales or rhonchi.  There does appear to be some basal crackles but cleared with cough Abd: soft, nontender, no hepatomegaly  Ext: no clubbing, cyanosis or edema.  Both feet are cool but with palpable pulses. Musculoskeletal:  No deformities, BUE and BLE strength normal and equal Skin: warm and dry  Neuro:  CNs 2-12 grossly, no focal abnormalities noted Psych:  Normal affect    EKG:  The ECG that was done in the emergency room was personally reviewed and demonstrates sinus rhythm, rate 75 bpm.  Roughly 1.5  mm elevations noted in ruminal 3 and aVF.  Q waves noted in ruminal 3.  Also ST depressions noted in V1 through V6 with appearance of posterior ST elevation MI in V1 and V2.  There is also a blocked PAC and change in rate.  Relevant CV Studies:  June 2014: NUCLEAR STRESS TEST [facility]): EF 65%.  No wall motion abnormality.  No ischemia or infarction.  Laboratory Data:  High Sensitivity Troponin:   Recent Labs  Lab 06/22/19 0729  TROPONINIHS 4      Chemistry Recent Labs  Lab 06/22/19 0729  NA 140  K 3.5  CL 105  CO2 24  GLUCOSE 146*  BUN 15  CREATININE 1.14  CALCIUM 8.7*  GFRNONAA 58*  GFRAA >60  ANIONGAP 11    Recent Labs  Lab 06/22/19 0729  PROT 6.6  ALBUMIN 3.5  AST 21  ALT 17  ALKPHOS 35*  BILITOT 0.8   Hematology Recent Labs  Lab 06/22/19 0729  WBC 9.6  RBC 4.28  HGB 14.0  HCT 43.5  MCV 101.6*  MCH 32.7  MCHC 32.2  RDW 13.4  PLT 218   BNPNo results for input(s): BNP, PROBNP in the last 168 hours.  DDimer No results for input(s): DDIMER in the last 168 hours.   Radiology/Studies:  No results found.     TIMI Risk Score for ST  Elevation MI:   The patient's TIMI risk score is 6, which indicates a 16.1% risk of all cause mortality at 30 days.    Assessment and Plan:    Principal Problem:  Acute ST elevation myocardial infarction (STEMI) due to occlusion of posterior lateral branch of right coronary artery (HCC)    Coronary artery disease involving native coronary artery of native heart with unstable angina pectoris (HCC)   Hx of CABG x4 (LIMA-LAD, RIMA-dRCA, SVG-OM2, SVG-RI)   Hyperlipidemia   Essential hypertension     1. Inferoposterior STEMI in a  patient with known history of CAD status post CABG x4 reportedly in 2000.  Unfortunately markers not known.  Patient was brought directly to cardiac catheterization lab from the ER for planned urgent catheterization.  Further plans post catheterization. 2. At the time of H&P,  medication list not yet updated.  Further plans per catheter work. 3. Will likely require statin and dual antiplatelets. 4. Beta-blocker 5. Cardiac rehab consult 6. Check 2D echo    Severity of Illness: The appropriate patient status for this patient is INPATIENT. Inpatient status is judged to be reasonable and necessary in order to provide the required intensity of service to ensure the patient's safety. The patient's presenting symptoms, physical exam findings, and initial radiographic and laboratory data in the context of their chronic comorbidities is felt to place them at high risk for further clinical deterioration. Furthermore, it is not anticipated that the patient will be medically stable for discharge from the hospital within 2 midnights of admission. The following factors support the patient status of inpatient.   " The patient's presenting symptoms include severe chest pain. " The worrisome physical exam findings include mild basal crackles. " The initial radiographic and laboratory data are worrisome because of inferior and posterior escalation on EKG. " The chronic co-morbidities include advanced age, history of known CAD, carotid artery disease, hypertension and hyperlipidemia..   * I certify that at the point of admission it is my clinical judgment that the patient will require inpatient hospital care spanning beyond 2 midnights from the point of admission due to high intensity of service, high risk for further deterioration and high frequency of surveillance required.*    For questions or updates, please contact [place] Please consult [email or web address] for contact info under       Signed, Glenetta Hew, MD  06/22/2019 10:50 AM

## 2019-06-22 NOTE — ED Provider Notes (Signed)
[facility] EMERGENCY DEPARTMENT Provider Note   CSN: [id number] Arrival date & time: 06/22/19  [id number]     History Chief Complaint  Patient presents with  . Chest Pain    Alex [name] is a 86 y.o. male.  HPI Patient presents with chest pain.  Began around 3 hours prior to arrival.  History of CABG around 25 years ago.  Sounds as if he does not follow-up much however.  States he takes aspirin occasionally but does more natural medicines.  Pain is dull in his mid chest.  Does not remember if this feels like it did before his CABG.  He has been doing well the last few days without any pain.  No nausea or vomiting.  No diaphoresis.  Patient appears uncomfortable.    Past Medical History:  Diagnosis Date  . Acquired deformity of nose   . Calculus of kidney   . Carotid artery disease ([place])   . Cellulitis and abscess of unspecified site   . Cervicalgia   . CHF (congestive heart failure) ([place])   . Coronary atherosclerosis of unspecified type of vessel, native or graft   . Dizziness and giddiness   . Encounter for long-term (current) use of other medications   . Esophagitis, unspecified   . Hyperlipidemia   . Hypertrophy of prostate with urinary obstruction and other lower urinary tract symptoms (LUTS)   . Insomnia, unspecified   . Internal hemorrhoids without mention of complication   . Obesity, unspecified   . Old myocardial infarction   . Other and unspecified hyperlipidemia   . Other premature beats   . Palpitations   . 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 Shortness of breath   . Slowing of urinary stream   . Special screening for malignant neoplasm of prostate   . Unspecified essential hypertension   . 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 Unspecified vitamin D deficiency   . Urinary frequency     Patient Active Problem List   Diagnosis Date Noted  . Abscessed tooth  09/01/2016  . 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 Bilateral knee pain 01/30/2014  . Cerumen impaction 01/30/2014  . Edema 10/09/2013  . Reflux esophagitis 04/10/2013  . Carotid artery disease ([place]) 11/09/2012  . Hyperlipidemia   . Essential hypertension   . Coronary atherosclerosis   . Dizziness and giddiness   . Obesity   . Insomnia   . BPH (benign prostatic hypertrophy) with urinary obstruction   . Unspecified vitamin D deficiency     Past Surgical History:  Procedure Laterality Date  . CABG X 4  2000  . CARDIAC CATHETERIZATION    . CAROTID ENDARTERECTOMY Left 06-14-14   CE  . COLONOSCOPY  2007   Dr Lajoyce Corners, normal  . CORONARY ARTERY BYPASS GRAFT     2000  . ENDARTERECTOMY Left 06/14/2014   Procedure: LEFT CAROTID ENDARTERECTOMY WITH VASCUGUARD PATCH ANGIOPLASTY;  Surgeon: Serafina Mitchell, MD;  Location: [facility];  Service: Vascular;  Laterality: Left;  . ESOPHAGOGASTRODUODENOSCOPY ENDOSCOPY  2007   Dr Lajoyce Corners, with biopsy  . EXCISIONAL HEMORRHOIDECTOMY  1981  . LESION FROM FOREHEAD,(R) EAR (L) ARM     DR Almira Coaster  . LITHOTRIPSY  2005   Dr Almira Coaster  . TONSILLECTOMY  1940  . trench in right side of jaw after abcesses  06/01/2016       Family History  Problem Relation  Age of Onset  . Cancer Mother        LIVER  . Cancer Brother        PROSTATE  . Cancer Daughter     Social History   Tobacco Use  . Smoking status: Former Smoker    Packs/day: 2.00    Years: 25.00    Pack years: 50.00    Types: Cigarettes    Quit date: 05/03/1973    Years since quitting: 46.1  . Smokeless tobacco: Never Used  Substance Use Topics  . Alcohol use: Yes    Alcohol/week: 4.0 standard drinks    Types: 1 Glasses of wine, 3 Cans of beer per week  . Drug use: No    Home Medications Prior to Admission medications   Medication Sig Start Date End Date Taking? Authorizing Provider  b complex vitamins tablet Take 1 tablet by mouth daily.    [provider]  cholecalciferol (VITAMIN D)  1000 UNITS tablet Take 1,000 Units by mouth daily.     [provider]  Cinnamon 500 MG TABS Take 1 tablet by mouth daily.    [provider]  CITRUS BERGAMOT PO Take 1 capsule by mouth daily. Take one capsule once daily to lower cholesterol    [provider]  Coenzyme Q10 (CO Q 10) 100 MG CAPS Take 1 capsule by mouth daily.    [provider]  diazepam (VALIUM) 5 MG tablet TAKE 1/2 TABLET BY MOUTH TWICE DAILY 05/08/19   Reed, Tiffany L, DO  doxepin (SINEQUAN) 50 MG capsule TAKE 1 CAPSULE BY MOUTH AT BEDTIME AS NEEDED FOR INSOMNIA OR ANXIETY 05/22/19   Reed, Tiffany L, DO  fish oil-omega-3 fatty acids 1000 MG capsule Take 2 g by mouth daily. Take one tablet once a day for cholesterol    [provider]  Fluticasone-Salmeterol (ADVAIR) 250-50 MCG/DOSE AEPB Inhale 1 puff into the lungs daily.    [provider]  metoprolol succinate (TOPROL-XL) 50 MG 24 hr tablet TAKE 1 TABLET BY MOUTH ONCE DAILY TAKE WITH FOOD OR IMMEDIATELY FOLLOWING A MEAL 12/19/18   Ngetich, Dinah C, NP  Nutritional Supplements (NUTRITIONAL SUPPLEMENT PO) Ginger and Turmeric. Take 1 capsule by mouth daily.    [provider]  Potassium 99 MG TABS Take 1 tablet by mouth daily. Take one tablet once a day for cramps    [provider]  ramipril (ALTACE) 10 MG capsule TAKE 1 CAPSULE BY MOUTH EVERY DAY 12/18/18   Reed, Tiffany L, DO  RESVERATROL 100 MG CAPS Take 1 capsule by mouth daily. Take one tablet once a day    [provider]  UNABLE TO FIND 1 capsule daily. Med Name: [facility] Nutrient    [provider]    Allergies    Patient has no known allergies.  Review of Systems   Review of Systems  Constitutional: Negative for appetite change.  HENT: Negative for congestion.   Cardiovascular: Positive for chest pain.  Gastrointestinal: Negative for abdominal pain.  Genitourinary: Negative for flank pain.  Musculoskeletal: Negative  for back pain.  Skin: Negative for rash.  Neurological: Negative for weakness.  Psychiatric/Behavioral: Negative for confusion.    Physical Exam Updated Vital Signs Ht 5' 10.5" (1.791 m)   Wt 95.3 kg   BMI 29.71 kg/m   Physical Exam Vitals reviewed.  HENT:     Head: Normocephalic.  Cardiovascular:     Rate and Rhythm: Normal rate and regular rhythm.  Pulmonary:  Effort: Pulmonary effort is normal.     Breath sounds: No wheezing.  Abdominal:     Tenderness: There is no abdominal tenderness.  Musculoskeletal:     Cervical back: Neck supple.     Right lower leg: No edema.     Left lower leg: No edema.  Skin:    General: Skin is warm.     Capillary Refill: Capillary refill takes less than 2 seconds.  Neurological:     Mental Status: He is alert.     Comments: Awake and pleasant.  Appears uncomfortable.  Somewhat slow to answer questions.     ED Results / Procedures / Treatments   Labs (all labs ordered are listed, but only abnormal results are displayed) Labs Reviewed  RESPIRATORY PANEL BY RT PCR (FLU A&B, COVID)  HEMOGLOBIN A1C  CBC WITH DIFFERENTIAL/PLATELET  PROTIME-INR  APTT  COMPREHENSIVE METABOLIC PANEL  LIPID PANEL  TROPONIN I (HIGH SENSITIVITY)    EKG EKG Interpretation  Date/Time:  Friday June 22 2019 07:18:28 EST Ventricular Rate:  80 PR Interval:    QRS Duration: 108 QT Interval:  420 QTC Calculation: 485 R Axis:   25 Text Interpretation: Sinus rhythm Inferior infarct, old Posterior infarct, acute (LCx) >>> Acute MI <<< Confirmed by Davonna Belling [phone]) on 06/22/2019 7:20:11 AM   EKG Interpretation  Date/Time:  Friday June 22 2019 07:21:11 EST Ventricular Rate:  75 PR Interval:    QRS Duration: 111 QT Interval:  415 QTC Calculation: 464 R Axis:   32 Text Interpretation: Sinus rhythm Consider inferior infarct Posterior infarct, acute (LCx) >>> Acute MI <<< Confirmed by Davonna Belling [phone]) on 06/22/2019 7:47:52 AM        Radiology No results found.  Procedures Procedures (including critical care time)  Medications Ordered in ED Medications  0.9 %  sodium chloride infusion ( Intravenous New Bag/Given 06/22/19 0738)  aspirin chewable tablet 324 mg (324 mg Oral Not Given 06/22/19 0735)  heparin 5000 UNIT/ML injection (has no administration in time range)  nitroGLYCERIN (NITROSTAT) 0.4 MG SL tablet (has no administration in time range)  heparin injection 4,000 Units (4,000 Units Intravenous Given 06/22/19 [id number])    ED Course  I have reviewed the triage vital signs and the nursing notes.  Pertinent labs & imaging results that were available during my care of the patient were reviewed by me and considered in my medical decision making (see chart for details).    MDM Rules/Calculators/A&P                     40 discussed with Dr. Ellyn Hack  Patient presents with chest pain.  Initial EKG showed likely STEMI, however there was some baseline wander so was repeated immediately and did show ST elevation.  Code STEMI called.  Discussed with Dr. Ellyn Hack and will be taken to the Cath Lab.  Heparin aspirin and nitroglycerin given in the ER.    Final Clinical Impression(s) / ED Diagnoses Final diagnoses:  ST elevation myocardial infarction (STEMI), unspecified artery [facility])    Rx / DC Orders ED Discharge Orders    None       Davonna Belling, MD 06/22/19 [phone]

## 2019-06-22 NOTE — Progress Notes (Signed)
Pt ambulated with assist of 1 to bathroom and sink for pm care after 2030. Updated pt's daughter by phone.

## 2019-06-23 ENCOUNTER — Inpatient Hospital Stay (HOSPITAL_COMMUNITY): Payer: Medicare HMO

## 2019-06-23 DIAGNOSIS — I340 Nonrheumatic mitral (valve) insufficiency: Secondary | ICD-10-CM

## 2019-06-23 DIAGNOSIS — I255 Ischemic cardiomyopathy: Secondary | ICD-10-CM | POA: Insufficient documentation

## 2019-06-23 DIAGNOSIS — I2129 ST elevation (STEMI) myocardial infarction involving other sites: Secondary | ICD-10-CM

## 2019-06-23 HISTORY — PX: TRANSTHORACIC ECHOCARDIOGRAM: SHX275

## 2019-06-23 LAB — ECHOCARDIOGRAM COMPLETE
Height: 70 in
Weight: 3566.16 oz

## 2019-06-23 LAB — CBC
HCT: 38.3 % — ABNORMAL LOW (ref 39.0–52.0)
Hemoglobin: 12.7 g/dL — ABNORMAL LOW (ref 13.0–17.0)
MCH: 32.4 pg (ref 26.0–34.0)
MCHC: 33.2 g/dL (ref 30.0–36.0)
MCV: 97.7 fL (ref 80.0–100.0)
Platelets: 162 10*3/uL (ref 150–400)
RBC: 3.92 MIL/uL — ABNORMAL LOW (ref 4.22–5.81)
RDW: 13.6 % (ref 11.5–15.5)
WBC: 10 10*3/uL (ref 4.0–10.5)
nRBC: 0 % (ref 0.0–0.2)

## 2019-06-23 LAB — BASIC METABOLIC PANEL
Anion gap: 8 (ref 5–15)
BUN: 13 mg/dL (ref 8–23)
CO2: 24 mmol/L (ref 22–32)
Calcium: 8.2 mg/dL — ABNORMAL LOW (ref 8.9–10.3)
Chloride: 106 mmol/L (ref 98–111)
Creatinine, Ser: 1.13 mg/dL (ref 0.61–1.24)
GFR calc Af Amer: >60 mL/min (ref >60)
GFR calc non Af Amer: 59 mL/min — ABNORMAL LOW (ref >60)
Glucose, Bld: 107 mg/dL — ABNORMAL HIGH (ref 70–99)
Potassium: 3.9 mmol/L (ref 3.5–5.1)
Sodium: 138 mmol/L (ref 135–145)

## 2019-06-23 LAB — LIPID PANEL
Cholesterol: 153 mg/dL (ref 0–200)
HDL: 34 mg/dL — ABNORMAL LOW (ref >40)
LDL Cholesterol: 102 mg/dL — ABNORMAL HIGH (ref 0–99)
Total CHOL/HDL Ratio: 4.5 RATIO
Triglycerides: 83 mg/dL (ref <150)
VLDL: 17 mg/dL (ref 0–40)

## 2019-06-23 LAB — HEMOGLOBIN A1C
Hgb A1c MFr Bld: 5.9 % — ABNORMAL HIGH (ref 4.8–5.6)
Mean Plasma Glucose: 122.63 mg/dL

## 2019-06-23 LAB — POCT ACTIVATED CLOTTING TIME: Activated Clotting Time: 136 seconds

## 2019-06-23 LAB — TROPONIN I (HIGH SENSITIVITY)
Troponin I (High Sensitivity): >27000 ng/L (ref <18)
Troponin I (High Sensitivity): >27000 ng/L (ref <18)

## 2019-06-23 MED ORDER — CHLORHEXIDINE GLUCONATE CLOTH 2 % EX PADS
6.0000 | MEDICATED_PAD | Freq: Every day | CUTANEOUS | Status: DC
  Administered 2019-06-23 – 2019-06-24 (×2): 6 via TOPICAL

## 2019-06-23 MED ORDER — PERFLUTREN LIPID MICROSPHERE
1.0000 mL | INTRAVENOUS | Status: AC | PRN
  Administered 2019-06-23: 17:00:00 2 mL via INTRAVENOUS
  Filled 2019-06-23: qty 10

## 2019-06-23 NOTE — Progress Notes (Signed)
Progress Note  Patient Name: Alex [name] Date of Encounter: 06/23/2019  Primary Cardiologist: Quay Burow, MD  Subjective   No chest pain, rested reasonably well.  No shortness of breath or palpitations.  No abdominal pain.  Inpatient Medications    Scheduled Meds: . aspirin  324 mg Oral Once  . aspirin  81 mg Oral Daily  . atorvastatin  80 mg Oral q1800  . Chlorhexidine Gluconate Cloth  6 each Topical Daily  . cholecalciferol  1,000 Units Oral Daily  . diazepam  2.5 mg Oral BID  . heparin  5,000 Units Subcutaneous Q8H  . metoprolol succinate  50 mg Oral Daily  . mometasone-formoterol  2 puff Inhalation BID  . sodium chloride flush  3 mL Intravenous Q12H  . ticagrelor  90 mg Oral BID   Continuous Infusions: . sodium chloride 10 mL/hr (06/22/19 2114)  . sodium chloride     PRN Meds: sodium chloride, acetaminophen, morphine injection, ondansetron (ZOFRAN) IV, sodium chloride flush   Vital Signs    Vitals:   06/23/19 0400 06/23/19 0500 06/23/19 0625 06/23/19 0700  BP: 118/71 117/65 139/70 125/76  Pulse: 73 72 77 76  Resp: 17 11 17 16   Temp:      TempSrc:      SpO2: 95% 94% 93% 98%  Weight:      Height:        Intake/Output Summary (Last 24 hours) at 06/23/2019 0849 Last data filed at 06/23/2019 0752 Gross per 24 hour  Intake 1191.07 ml  Output 1250 ml  Net -58.93 ml   Filed Weights   06/22/19 0726 06/22/19 1100  Weight: 95.3 kg 101.1 kg    Telemetry    Normal sinus rhythm.  Personally reviewed.  ECG    An ECG dated 06/23/2019 was personally reviewed today and demonstrated:  Normal sinus rhythm with old inferior infarct pattern.  Physical Exam   GEN:  Elderly male.  No acute distress.   Neck: No JVD. Cardiac: RRR, no murmur, rub, or gallop.  Respiratory: Nonlabored. Clear to auscultation bilaterally. GI: Soft, nontender, bowel sounds present. MS: No edema; No deformity. Neuro:  Nonfocal. Psych: Alert and oriented x 3. Normal affect.  Labs      Chemistry Recent Labs  Lab 06/22/19 0729 06/22/19 0808 06/23/19 0549  NA 140 142 138  K 3.5 3.8 3.9  CL 105 105 106  CO2 24  --  24  GLUCOSE 146* 142* 107*  BUN 15 15 13   CREATININE 1.14 0.90 1.13  CALCIUM 8.7*  --  8.2*  PROT 6.6  --   --   ALBUMIN 3.5  --   --   AST 21  --   --   ALT 17  --   --   ALKPHOS 35*  --   --   BILITOT 0.8  --   --   GFRNONAA 58*  --  59*  GFRAA >60  --  >60  ANIONGAP 11  --  8     Hematology Recent Labs  Lab 06/22/19 0729 06/22/19 0808 06/23/19 0549  WBC 9.6  --  10.0  RBC 4.28  --  3.92*  HGB 14.0 12.9* 12.7*  HCT 43.5 38.0* 38.3*  MCV 101.6*  --  97.7  MCH 32.7  --  32.4  MCHC 32.2  --  33.2  RDW 13.4  --  13.6  PLT 218  --  162    Cardiac Enzymes Recent Labs  Lab 06/22/19 0729  06/22/19 1111 06/22/19 1612 06/22/19 2306 06/23/19 0549  TROPONINIHS 4 393* 9,320* >27,000* >27,000*    Radiology    CARDIAC CATHETERIZATION  Result Date: 06/22/2019  CULPRIT LESION: SVG-OM2 graft was visualized by angiography and is normal in caliber. The graft exhibits severe diffuse disease. Origin to Mid Graft lesion is 100% stenosed.  Aspiration Thrombectomy & Balloon angioplasty was performed using a BALLOON SAPPHIRE 2.5X20. -- Unable to restore flow (PCI aborted, attention turned to LM-LCx)  Post intervention, there is a 100% residual stenosis.  NATIVE LESION: Mid LM to Ost LAD lesion is 50% stenosed with 85% stenosed side branch in Ost Cx to Mid Cx.  A drug-eluting stent was successfully placed from LM-LCx using a SYNERGY XD 2.50X38. tapered post-dilation 3.1-2.6 mm  Post intervention, there is a 0% residual stenosis from LM-LCx. 2nd Mrg lesion is now 100% stenosed (ad been 90%) .  ------  Colon Flattery LAD to Mid LAD lesion is 60% stenosed with 50% stenosed side branch in 1st Diag. Mid LAD lesion is 100% stenosed.  Ramus lesion is 80% stenosed.  LIMA-LAD graft was visualized by angiography and is normal in caliber. The graft exhibits no  disease.  SVG-RI graft was visualized by angiography and is normal in caliber. The graft exhibits no disease.  ------  Mid RCA to Dist RCA lesion is 100% stenosed.  RIMA-dRCA graft was visualized by angiography and is normal in caliber. The graft exhibits no disease.  -------------------------------------------------  There is mild left ventricular systolic dysfunction. The left ventricular ejection fraction is 45-50% by visual estimate.  There is mild (2+) mitral regurgitation.  LV end diastolic pressure is moderately elevated.  SUMMARY  Culprit lesion is occluded SVG-OM2--unable to open despite extensive angioplasty, aspiration thrombectomy and intravenous adenosine  Ostial-proximal LCx diffuse 80% -> successful DES PCI of LEFT MAIN into LCx using Synergy DES 2.5 mm 38 mm postdilated to 3.2 mm proximally and 2.6 mm distal.  Subtotal occluded LAD with patent pedicle LIMA-LAD  100% occluded mid RCA with patent pedicle RIMA-dRCA  Moderately elevated LVEDP with low normal LVEF (difficult to assess wall motion) RECOMMENDATION  Admit to CCU for overnight monitoring.  Start high-dose high intensity statin  Continue beta-blocker, hold ACE inhibitor until at least tomorrow to reassess blood pressures.  With likely completion of his existing MI, would not consider fast-track discharge. Glenetta Hew, M.D., M.S. Interventional Cardiologist   DG Chest Port 1 View  Result Date: 06/22/2019 CLINICAL DATA:  Chest pain.  Stent placement. EXAM: PORTABLE CHEST 1 VIEW COMPARISON:  Chest x-ray 09/18/2018 report. FINDINGS: Prior CABG. Cardiomegaly. Bilateral mild interstitial prominence. Mild CHF cannot be excluded. No pleural effusion or pneumothorax. Degenerative change thoracic spine. IMPRESSION: Prior CABG. Cardiomegaly. Mild bilateral interstitial prominence. Mild CHF cannot be excluded. Electronically Signed   By: Marcello Moores  Register   On: 06/22/2019 11:27    Cardiac Studies   Cardiac catheterization  06/22/2019:  CULPRIT LESION: SVG-OM2 graft was visualized by angiography and is normal in caliber. The graft exhibits severe diffuse disease. Origin to Mid Graft lesion is 100% stenosed.  Aspiration Thrombectomy & Balloon angioplasty was performed using a BALLOON SAPPHIRE 2.5X20. -- Unable to restore flow (PCI aborted, attention turned to LM-LCx)  Post intervention, there is a 100% residual stenosis.  NATIVE LESION: Mid LM to Ost LAD lesion is 50% stenosed with 85% stenosed side branch in Ost Cx to Mid Cx.  A drug-eluting stent was successfully placed from LM-LCx using a SYNERGY XD 2.50X38. tapered post-dilation 3.1-2.6 mm  Post intervention, there is a 0% residual stenosis from LM-LCx. 2nd Mrg lesion is now 100% stenosed (ad been 90%) .  ------  Colon Flattery LAD to Mid LAD lesion is 60% stenosed with 50% stenosed side branch in 1st Diag. Mid LAD lesion is 100% stenosed.  Ramus lesion is 80% stenosed.  LIMA-LAD graft was visualized by angiography and is normal in caliber. The graft exhibits no disease.  SVG-RI graft was visualized by angiography and is normal in caliber. The graft exhibits no disease.  ------  Mid RCA to Dist RCA lesion is 100% stenosed.  RIMA-dRCA graft was visualized by angiography and is normal in caliber. The graft exhibits no disease.  -------------------------------------------------  There is mild left ventricular systolic dysfunction. The left ventricular ejection fraction is 45-50% by visual estimate.  There is mild (2+) mitral regurgitation.  LV end diastolic pressure is moderately elevated.   SUMMARY  Culprit lesion is occluded SVG-OM2--unable to open despite extensive angioplasty, aspiration thrombectomy and intravenous adenosine  Ostial-proximal LCx diffuse 80% -> successful DES PCI of LEFT MAIN into LCx using Synergy DES 2.5 mm 38 mm postdilated to 3.2 mm proximally and 2.6 mm distal.  Subtotal occluded LAD with patent pedicle LIMA-LAD  100% occluded  mid RCA with patent pedicle RIMA-dRCA  Moderately elevated LVEDP with low normal LVEF (difficult to assess wall motion)   RECOMMENDATION  Admit to CCU for overnight monitoring.  Start high-dose high intensity statin  Continue beta-blocker, hold ACE inhibitor until at least tomorrow to reassess blood pressures.  With likely completion of his existing MI, would not consider fast-track discharge.  Patient Profile     86 y.o. male with a history of multivessel CAD status post CABG in 2000, carotid artery disease status post left CEA, hyperlipidemia, and hypertension now presenting with acute inferoposterior STEMI.  Assessment & Plan    1.  Acute inferoposterior STEMI status post urgent cardiac catheterization on February 19.  Peak high-sensitivity troponin I greater than 27,000.  Culprit lesion was occluded SVG to OM 2, unable to be revascularized as noted in the report, and patient now status post placement of DES extending from left main into circumflex with good result. Otherwise LIMA to LAD and RIMA to RCA were patent.  LVEF 45 to 50% on angiography.  Currently on aspirin, Lipitor, Toprol-XL, and Brilinta.  Altace held initially.  2.  LDL 102.  Not on statin as an outpatient.  3.  Carotid artery disease status post left CEA.  Follow-up carotid Dopplers from July 2020 showed 1 to 39% RICA stenosis and no significant stenosis on the left.  He is asymptomatic.  I reviewed the chart and discussed the situation with nursing.  Continue aspirin, Lipitor, Toprol-XL, and Brilinta.  Plan to resume Altace tomorrow if blood pressure and renal function remain stable.  Follow-up echocardiogram results.  Phase 1 cardiac rehabilitation.  Anticipate transfer to [place] within the next 24 hours.  Signed, Rozann Lesches, MD  06/23/2019, 8:49 AM

## 2019-06-23 NOTE — Plan of Care (Signed)
  Problem: Education: Goal: Understanding of cardiac disease, CV risk reduction, and recovery process will improve Outcome: Progressing Goal: Understanding of medication regimen will improve Outcome: Progressing Goal: Individualized Educational Video(s) Outcome: Progressing   Problem: Activity: Goal: Ability to tolerate increased activity will improve Outcome: Progressing   Problem: Cardiac: Goal: Ability to achieve and maintain adequate cardiopulmonary perfusion will improve Outcome: Progressing Goal: Vascular access site(s) Level 0-1 will be maintained Outcome: Progressing   Problem: Health Behavior/Discharge Planning: Goal: Ability to safely manage health-related needs after discharge will improve Outcome: Progressing   Problem: Education: Goal: Knowledge of General Education information will improve Description: Including pain rating scale, medication(s)/side effects and non-pharmacologic comfort measures Outcome: Progressing   Problem: Health Behavior/Discharge Planning: Goal: Ability to manage health-related needs will improve Outcome: Progressing   Problem: Clinical Measurements: Goal: Will remain free from infection Outcome: Progressing Goal: Diagnostic test results will improve Outcome: Progressing Goal: Respiratory complications will improve Outcome: Progressing   Problem: Activity: Goal: Risk for activity intolerance will decrease Outcome: Progressing   Problem: Nutrition: Goal: Adequate nutrition will be maintained Outcome: Progressing   Problem: Coping: Goal: Level of anxiety will decrease Outcome: Progressing   Problem: Elimination: Goal: Will not experience complications related to bowel motility Outcome: Progressing Goal: Will not experience complications related to urinary retention Outcome: Progressing   Problem: Pain Managment: Goal: General experience of comfort will improve Outcome: Progressing   Problem: Safety: Goal: Ability to  remain free from injury will improve Outcome: Progressing   Problem: Skin Integrity: Goal: Risk for impaired skin integrity will decrease Outcome: Progressing

## 2019-06-23 NOTE — Progress Notes (Signed)
Echo with contrast performed

## 2019-06-23 NOTE — Progress Notes (Signed)
CARDIAC REHAB PHASE I   PRE:  Rate/Rhythm: 79 SR  BP:  Supine:   Sitting: 138/69  Standing:     SaO2: 95 RA  MODE:  Ambulation: 370 ft   POST:  Rate/Rhythm: 90 SR  BP:  Supine:   Sitting: 151/86  Standing:    SaO2: 97 RA 1310-1400  Assisted X 1 to ambulate. Gait steady. Pt walked 370 feet without c/o of pain or SOB. VS stable Pt back to recliner after walk with call light in reach. Started MI education with pt. I discussed stent with him and the need for ASA for life and Brilinta as long as his cardiologist recommends.Pt was given his stent card and information. He will need reinforcement with the medication compliance. Pt believes in apple cider vinegar, vitamins and healthy alternatives to medications. Will send referral to Outpt. CRP in [place]. He did do this after his [facility].   Rodney Langton RN 06/23/2019 1:54 PM

## 2019-06-24 ENCOUNTER — Other Ambulatory Visit: Payer: Self-pay

## 2019-06-24 DIAGNOSIS — I255 Ischemic cardiomyopathy: Secondary | ICD-10-CM

## 2019-06-24 LAB — BASIC METABOLIC PANEL
Anion gap: 10 (ref 5–15)
BUN: 11 mg/dL (ref 8–23)
CO2: 23 mmol/L (ref 22–32)
Calcium: 8.3 mg/dL — ABNORMAL LOW (ref 8.9–10.3)
Chloride: 105 mmol/L (ref 98–111)
Creatinine, Ser: 0.95 mg/dL (ref 0.61–1.24)
GFR calc Af Amer: >60 mL/min (ref >60)
GFR calc non Af Amer: >60 mL/min (ref >60)
Glucose, Bld: 109 mg/dL — ABNORMAL HIGH (ref 70–99)
Potassium: 3.6 mmol/L (ref 3.5–5.1)
Sodium: 138 mmol/L (ref 135–145)

## 2019-06-24 LAB — CBC
HCT: 38.3 % — ABNORMAL LOW (ref 39.0–52.0)
Hemoglobin: 13 g/dL (ref 13.0–17.0)
MCH: 32.7 pg (ref 26.0–34.0)
MCHC: 33.9 g/dL (ref 30.0–36.0)
MCV: 96.2 fL (ref 80.0–100.0)
Platelets: 165 10*3/uL (ref 150–400)
RBC: 3.98 MIL/uL — ABNORMAL LOW (ref 4.22–5.81)
RDW: 13.4 % (ref 11.5–15.5)
WBC: 8.9 10*3/uL (ref 4.0–10.5)
nRBC: 0 % (ref 0.0–0.2)

## 2019-06-24 MED ORDER — RAMIPRIL 5 MG PO CAPS
5.0000 mg | ORAL_CAPSULE | Freq: Every day | ORAL | Status: DC
  Administered 2019-06-24 – 2019-06-25 (×2): 5 mg via ORAL
  Filled 2019-06-24 (×2): qty 1

## 2019-06-24 NOTE — Progress Notes (Signed)
Progress Note  Patient Name: Alex [name] Date of Encounter: 06/24/2019  Primary Cardiologist: Quay Burow, MD  Subjective   No chest pain or shortness of breath.  States that he had a good day yesterday.  He did work with cardiac rehabilitation.  Appetite stable.  Inpatient Medications    Scheduled Meds: . aspirin  324 mg Oral Once  . aspirin  81 mg Oral Daily  . atorvastatin  80 mg Oral q1800  . Chlorhexidine Gluconate Cloth  6 each Topical Daily  . cholecalciferol  1,000 Units Oral Daily  . diazepam  2.5 mg Oral BID  . heparin  5,000 Units Subcutaneous Q8H  . metoprolol succinate  50 mg Oral Daily  . mometasone-formoterol  2 puff Inhalation BID  . sodium chloride flush  3 mL Intravenous Q12H  . ticagrelor  90 mg Oral BID   Continuous Infusions: . sodium chloride 10 mL/hr at 06/24/19 0700  . sodium chloride     PRN Meds: sodium chloride, acetaminophen, morphine injection, ondansetron (ZOFRAN) IV, sodium chloride flush   Vital Signs    Vitals:   06/24/19 0402 06/24/19 0500 06/24/19 0600 06/24/19 0700  BP: 135/71 (!) 150/78 (!) 150/87   Pulse: 74 82 87 81  Resp: 10 19 (!) 21 16  Temp:      TempSrc:      SpO2: 94% 96% 94% 95%  Weight:      Height:        Intake/Output Summary (Last 24 hours) at 06/24/2019 0812 Last data filed at 06/24/2019 0700 Gross per 24 hour  Intake 792.99 ml  Output 1100 ml  Net -307.01 ml   Filed Weights   06/22/19 0726 06/22/19 1100 06/24/19 0400  Weight: 95.3 kg 101.1 kg 96.9 kg    Telemetry    Normal sinus rhythm.  Personally reviewed.  ECG    I personally reviewed the tracing from 06/24/2019 which shows normal sinus rhythm with nonspecific ST-T changes.  Physical Exam   GEN:  Elderly male.  No acute distress.   Neck: No JVD. Cardiac:  RRR without gallop. Respiratory:  Clear to auscultation, no wheezing. GI:  Soft, bowel sounds present. MS:  No pitting edema; No deformity. Neuro:  Nonfocal. Psych: Alert and  oriented x 3. Normal affect.  Labs    Chemistry Recent Labs  Lab 06/22/19 0729 06/22/19 0729 06/22/19 0808 06/23/19 0549 06/24/19 0124  NA 140   < > 142 138 138  K 3.5   < > 3.8 3.9 3.6  CL 105   < > 105 106 105  CO2 24  --   --  24 23  GLUCOSE 146*   < > 142* 107* 109*  BUN 15   < > 15 13 11   CREATININE 1.14   < > 0.90 1.13 0.95  CALCIUM 8.7*  --   --  8.2* 8.3*  PROT 6.6  --   --   --   --   ALBUMIN 3.5  --   --   --   --   AST 21  --   --   --   --   ALT 17  --   --   --   --   ALKPHOS 35*  --   --   --   --   BILITOT 0.8  --   --   --   --   GFRNONAA 58*  --   --  59* >60  GFRAA >60  --   --  >  60 >60  ANIONGAP 11  --   --  8 10   < > = values in this interval not displayed.     Hematology Recent Labs  Lab 06/22/19 0729 06/22/19 0729 06/22/19 0808 06/23/19 0549 06/24/19 0124  WBC 9.6  --   --  10.0 8.9  RBC 4.28  --   --  3.92* 3.98*  HGB 14.0   < > 12.9* 12.7* 13.0  HCT 43.5   < > 38.0* 38.3* 38.3*  MCV 101.6*  --   --  97.7 96.2  MCH 32.7  --   --  32.4 32.7  MCHC 32.2  --   --  33.2 33.9  RDW 13.4  --   --  13.6 13.4  PLT 218  --   --  162 165   < > = values in this interval not displayed.    Cardiac Enzymes Recent Labs  Lab 06/22/19 0729 06/22/19 1111 06/22/19 1612 06/22/19 2306 06/23/19 0549  TROPONINIHS 4 393* 9,320* >27,000* >27,000*    Radiology    DG Chest Port 1 View  Result Date: 06/22/2019 CLINICAL DATA:  Chest pain.  Stent placement. EXAM: PORTABLE CHEST 1 VIEW COMPARISON:  Chest x-ray 09/18/2018 report. FINDINGS: Prior CABG. Cardiomegaly. Bilateral mild interstitial prominence. Mild CHF cannot be excluded. No pleural effusion or pneumothorax. Degenerative change thoracic spine. IMPRESSION: Prior CABG. Cardiomegaly. Mild bilateral interstitial prominence. Mild CHF cannot be excluded. Electronically Signed   By: Marcello Moores  Register   On: 06/22/2019 11:27   ECHOCARDIOGRAM COMPLETE  Result Date: 06/23/2019    ECHOCARDIOGRAM REPORT    Patient Name:   Alex [name] Date of Exam: 06/23/2019 Medical Rec #:  [id number]   Height:       70.0 in Accession #:    [id number]  Weight:       222.9 lb Date of Birth:  1931/05/28   BSA:          2.186 m Patient Age:    86 years    BP:           146/81 mmHg Patient Gender: M           HR:           76 bpm. Exam Location:  Inpatient Procedure: 2D Echo, Cardiac Doppler, Color Doppler and Intracardiac            Opacification Agent Indications:    STEMI 121.3  History:        Patient has no prior history of Echocardiogram examinations.  Sonographer:    Merrie Roof RDCS Referring Phys: Donnellson Haakon  1. Left ventricular ejection fraction, by estimation, is 45 to 50%. The left ventricle has mildly decreased function. The left ventricle has no regional wall motion abnormalities. Left ventricular diastolic parameters are consistent with Grade I diastolic dysfunction (impaired relaxation).  2. Right ventricular systolic function is normal. The right ventricular size is normal.  3. The mitral valve is normal in structure and function. Mild mitral valve regurgitation. No evidence of mitral stenosis.  4. The aortic valve is normal in structure and function. Aortic valve regurgitation is trivial. No aortic stenosis is present.  5. The inferior vena cava is normal in size with greater than 50% respiratory variability, suggesting right atrial pressure of 3 mmHg. FINDINGS  Left Ventricle: Left ventricular ejection fraction, by estimation, is 45 to 50%. The left ventricle has mildly decreased function. The left ventricle has no regional wall motion abnormalities.  Definity contrast agent was given IV to delineate the left ventricular endocardial borders. The left ventricular internal cavity size was normal in size. There is no left ventricular hypertrophy. Left ventricular diastolic parameters are consistent with Grade I diastolic dysfunction (impaired relaxation).  LV Wall Scoring: The posterior wall and basal  inferior segment are akinetic. Right Ventricle: The right ventricular size is normal. No increase in right ventricular wall thickness. Right ventricular systolic function is normal. Left Atrium: Left atrial size was normal in size. Right Atrium: Right atrial size was normal in size. Pericardium: There is no evidence of pericardial effusion. Mitral Valve: The mitral valve is normal in structure and function. Normal mobility of the mitral valve leaflets. Mild mitral valve regurgitation. No evidence of mitral valve stenosis. Tricuspid Valve: The tricuspid valve is normal in structure. Tricuspid valve regurgitation is not demonstrated. No evidence of tricuspid stenosis. Aortic Valve: The aortic valve is normal in structure and function. Aortic valve regurgitation is trivial. No aortic stenosis is present. Pulmonic Valve: The pulmonic valve was normal in structure. Pulmonic valve regurgitation is not visualized. No evidence of pulmonic stenosis. Aorta: The aortic root is normal in size and structure. Venous: The inferior vena cava is normal in size with greater than 50% respiratory variability, suggesting right atrial pressure of 3 mmHg. IAS/Shunts: No atrial level shunt detected by color flow Doppler. Additional Comments: EF correlates with catheterization.  LEFT VENTRICLE PLAX 2D LVIDd:         4.40 cm LVIDs:         3.36 cm LV PW:         1.69 cm LV IVS:        1.60 cm  LV Volumes (MOD) LV vol d, MOD A4C: 89.8 ml LV vol s, MOD A4C: 46.2 ml LV SV MOD A4C:     89.8 ml RIGHT VENTRICLE          IVC RV Basal diam:  3.81 cm  IVC diam: 1.85 cm LEFT ATRIUM           Index       RIGHT ATRIUM           Index LA diam:      5.10 cm 2.33 cm/m  RA Area:     15.70 cm LA Vol (A4C): 68.5 ml 31.34 ml/m RA Volume:   31.50 ml  14.41 ml/m  AORTIC VALVE LVOT Vmax:   89.60 cm/s LVOT Vmean:  51.700 cm/s LVOT VTI:    0.179 m  AORTA Ao Root diam: 3.40 cm MITRAL VALVE MV Area (PHT): 4.68 cm    SHUNTS MV Decel Time: 162 msec    Systemic  VTI: 0.18 m MV E velocity: 66.30 cm/s MV A velocity: 76.60 cm/s MV E/A ratio:  0.87 Candee Furbish MD Electronically signed by Candee Furbish MD Signature Date/Time: 06/23/2019/5:17:11 PM    Final     Cardiac Studies   Cardiac catheterization 06/22/2019:  CULPRIT LESION: SVG-OM2 graft was visualized by angiography and is normal in caliber. The graft exhibits severe diffuse disease. Origin to Mid Graft lesion is 100% stenosed.  Aspiration Thrombectomy & Balloon angioplasty was performed using a BALLOON SAPPHIRE 2.5X20. -- Unable to restore flow (PCI aborted, attention turned to LM-LCx)  Post intervention, there is a 100% residual stenosis.  NATIVE LESION: Mid LM to Ost LAD lesion is 50% stenosed with 85% stenosed side branch in Ost Cx to Mid Cx.  A drug-eluting stent was successfully placed from LM-LCx using a SYNERGY  XD [id number]. tapered post-dilation 3.1-2.6 mm  Post intervention, there is a 0% residual stenosis from LM-LCx. 2nd Mrg lesion is now 100% stenosed (ad been 90%) .  ------  Colon Flattery LAD to Mid LAD lesion is 60% stenosed with 50% stenosed side branch in 1st Diag. Mid LAD lesion is 100% stenosed.  Ramus lesion is 80% stenosed.  LIMA-LAD graft was visualized by angiography and is normal in caliber. The graft exhibits no disease.  SVG-RI graft was visualized by angiography and is normal in caliber. The graft exhibits no disease.  ------  Mid RCA to Dist RCA lesion is 100% stenosed.  RIMA-dRCA graft was visualized by angiography and is normal in caliber. The graft exhibits no disease.  -------------------------------------------------  There is mild left ventricular systolic dysfunction. The left ventricular ejection fraction is 45-50% by visual estimate.  There is mild (2+) mitral regurgitation.  LV end diastolic pressure is moderately elevated.   SUMMARY  Culprit lesion is occluded SVG-OM2--unable to open despite extensive angioplasty, aspiration thrombectomy and intravenous  adenosine  Ostial-proximal LCx diffuse 80% -> successful DES PCI of LEFT MAIN into LCx using Synergy DES 2.5 mm 38 mm postdilated to 3.2 mm proximally and 2.6 mm distal.  Subtotal occluded LAD with patent pedicle LIMA-LAD  100% occluded mid RCA with patent pedicle RIMA-dRCA  Moderately elevated LVEDP with low normal LVEF (difficult to assess wall motion)   RECOMMENDATION  Admit to CCU for overnight monitoring.  Start high-dose high intensity statin  Continue beta-blocker, hold ACE inhibitor until at least tomorrow to reassess blood pressures.  With likely completion of his existing MI, would not consider fast-track discharge.  Patient Profile     86 y.o. male with a history of multivessel CAD status post CABG in 2000, carotid artery disease status post left CEA, hyperlipidemia, and hypertension now presenting with acute inferoposterior STEMI.  Assessment & Plan    1.  Acute inferoposterior STEMI status post urgent cardiac catheterization on February 19.  Peak high-sensitivity troponin I greater than 27,000.  Culprit lesion was occluded SVG to OM 2, unable to be revascularized as noted in the report, and patient now status post placement of DES extending from left main into circumflex with good result. Otherwise LIMA to LAD and RIMA to RCA were patent.  He is hemodynamically stable without recurrent angina.  Follow-up echocardiogram shows LVEF 45 to 50% with mild diastolic dysfunction.  Medications include aspirin, Lipitor, Toprol-XL, and Brilinta.  2.  LDL 102.  Not on statin as an outpatient.  Now on Lipitor 80 mg daily.  3.  Carotid artery disease status post left CEA.  Follow-up carotid Dopplers from July 2020 showed 1 to 39% RICA stenosis and no significant stenosis on the left.  He is asymptomatic.  4.  Essential hypertension, blood pressure trending up.  Renal function stable.  Will plan to add back Altace which he was taking as an outpatient.  Plan to transfer out to  telemetry and increase activity, possible discharge in the next 24 hours.  Continue aspirin, Brilinta, Toprol-XL, and Lipitor.  Adding back Altace starting at 5 mg daily.  Follow-up BMET in a.m.  Signed, Rozann Lesches, MD  06/24/2019, 8:12 AM

## 2019-06-24 NOTE — Progress Notes (Signed)
Pt transported to 2C08 via wheelchair accompanied by RN and pt's son. Connected to unit monitor. Tolerated well. No s/s of distress at this time. Devona Konig RN at bedside to receive pt.

## 2019-06-25 ENCOUNTER — Telehealth: Payer: Self-pay | Admitting: Cardiology

## 2019-06-25 DIAGNOSIS — Z955 Presence of coronary angioplasty implant and graft: Secondary | ICD-10-CM

## 2019-06-25 LAB — BASIC METABOLIC PANEL
Anion gap: 11 (ref 5–15)
BUN: 13 mg/dL (ref 8–23)
CO2: 21 mmol/L — ABNORMAL LOW (ref 22–32)
Calcium: 8.5 mg/dL — ABNORMAL LOW (ref 8.9–10.3)
Chloride: 105 mmol/L (ref 98–111)
Creatinine, Ser: 1.03 mg/dL (ref 0.61–1.24)
GFR calc Af Amer: >60 mL/min (ref >60)
GFR calc non Af Amer: >60 mL/min (ref >60)
Glucose, Bld: 108 mg/dL — ABNORMAL HIGH (ref 70–99)
Potassium: 3.5 mmol/L (ref 3.5–5.1)
Sodium: 137 mmol/L (ref 135–145)

## 2019-06-25 LAB — CBC
HCT: 38.8 % — ABNORMAL LOW (ref 39.0–52.0)
Hemoglobin: 13.2 g/dL (ref 13.0–17.0)
MCH: 32.8 pg (ref 26.0–34.0)
MCHC: 34 g/dL (ref 30.0–36.0)
MCV: 96.3 fL (ref 80.0–100.0)
Platelets: 183 10*3/uL (ref 150–400)
RBC: 4.03 MIL/uL — ABNORMAL LOW (ref 4.22–5.81)
RDW: 13.3 % (ref 11.5–15.5)
WBC: 9.3 10*3/uL (ref 4.0–10.5)
nRBC: 0 % (ref 0.0–0.2)

## 2019-06-25 MED ORDER — ATORVASTATIN CALCIUM 80 MG PO TABS: 80.0000 mg | ORAL_TABLET | Freq: Every day | ORAL | 6 refills | Status: DC

## 2019-06-25 MED ORDER — FUROSEMIDE 20 MG PO TABS: 20.0000 mg | ORAL_TABLET | ORAL | 2 refills | Status: DC | PRN

## 2019-06-25 MED ORDER — TICAGRELOR 90 MG PO TABS: 90.0000 mg | ORAL_TABLET | Freq: Two times a day (BID) | ORAL | 11 refills | Status: DC

## 2019-06-25 MED ORDER — NITROGLYCERIN 0.4 MG SL SUBL: 0.4000 mg | SUBLINGUAL_TABLET | SUBLINGUAL | 12 refills | Status: DC | PRN

## 2019-06-25 MED ORDER — NITROGLYCERIN 0.4 MG SL SUBL: 0.4000 mg | SUBLINGUAL_TABLET | SUBLINGUAL | 12 refills | Status: AC | PRN

## 2019-06-25 MED ORDER — POTASSIUM CHLORIDE CRYS ER 20 MEQ PO TBCR
40.0000 meq | EXTENDED_RELEASE_TABLET | Freq: Once | ORAL | Status: AC
  Administered 2019-06-25: 10:00:00 40 meq via ORAL
  Filled 2019-06-25: qty 2

## 2019-06-25 MED FILL — NITROGLYCERIN 0.4 MG TAB SL: 0.4 | 8 days supply | Qty: 25 | Fill #0

## 2019-06-25 MED FILL — BRILINTA 90 MG TABLET: 90 | 30 days supply | Qty: 60 | Fill #0

## 2019-06-25 MED FILL — FUROSEMIDE 20 MG TAB: 20 | 30 days supply | Qty: 30 | Fill #0

## 2019-06-25 MED FILL — ATORVASTATIN CALCIUM 80 MG: 80 | 30 days supply | Qty: 30 | Fill #0

## 2019-06-25 NOTE — Care Management (Signed)
Per Kathe Becton W/[employer] @ [phone].  Co-pay amount for Brilinta 90 mg. twice a day $45.00.,for a 30 day supply.  No PA required No deductible [facility] : [facility].  Ref.# [id number]

## 2019-06-25 NOTE — TOC Progression Note (Signed)
Transition of Care [facility]) - Progression Note    Patient Details  Name: Alex [name] MRN: [id number] Date of Birth: 1931/05/27  [facility]) CM/SW Contact  Zenon Mayo, RN Phone Number: 06/25/2019, 12:01 PM  Clinical Narrative:    Patient will be on brilinta, TOC filled the first 30 day free for him.  NCM informed patient from the benefit check his refills will cost around 45.00 , he states he understands.        Expected Discharge Plan and Services           Expected Discharge Date: 06/25/19                                     Social Determinants of Health (SDOH) Interventions    Readmission Risk Interventions No flowsheet data found.

## 2019-06-25 NOTE — Progress Notes (Signed)
CARDIAC REHAB PHASE I   PRE:  Rate/Rhythm: 80 SR  BP:  Sitting: 136/74      SaO2: 97 RA  MODE:  Ambulation: 400 ft   POST:  Rate/Rhythm: 89 SR  BP:  Sitting: 148/76    SaO2: 98 RA  Pt ambulated 473ft in hallway standby assist with steady gait. Pt educated on importance of ASA and Brilinta. Pt given heart healthy diet. Reviewed restrictions, site care and exercise guidelines. Pt referred to CRP II [place].  [id number] Rufina Falco, RN BSN 06/25/2019 10:25 AM

## 2019-06-25 NOTE — Telephone Encounter (Signed)
New message  Per Alex [name] scheduled a TOC appt with Alex [name] on 07/02/19 at 11:15am.

## 2019-06-25 NOTE — Discharge Summary (Addendum)
Discharge Summary    Patient ID: Alex [name] MRN: [id number]; DOB: 04/10/1932  Admit date: 06/22/2019 Discharge date: 06/25/2019  Primary Care Provider: Gayland Curry, DO  Primary Cardiologist: Alex Hew, MD   Discharge Diagnoses    Principal Problem:   ST elevation myocardial infarction (STEMI) of true posterior wall, initial episode of care [facility]) Active Problems:   Hyperlipidemia   Essential hypertension   Coronary artery disease involving native coronary artery of native heart with unstable angina pectoris ([place])   Hx of CABG x4 (LIMA-LAD, RIMA-dRCA, SVG-OM2, SVG-RI) -- OCCLUDED SVG-OM   Acute ST elevation myocardial infarction (STEMI) due to occlusion of posterior lateral branch of right coronary artery ([place])  Diagnostic Studies/Procedures    Echo 06/23/19 1. Left ventricular ejection fraction, by estimation, is 45 to 50%. The  left ventricle has mildly decreased function. The left ventricle has no  regional wall motion abnormalities. Left ventricular diastolic parameters  are consistent with Grade I  diastolic dysfunction (impaired relaxation).  2. Right ventricular systolic function is normal. The right ventricular  size is normal.  3. The mitral valve is normal in structure and function. Mild mitral  valve regurgitation. No evidence of mitral stenosis.  4. The aortic valve is normal in structure and function. Aortic valve  regurgitation is trivial. No aortic stenosis is present.  5. The inferior vena cava is normal in size with greater than 50%  respiratory variability, suggesting right atrial pressure of 3 mmHg.   CORONARY/GRAFT ACUTE MI REVASCULARIZATION 06/22/19  CORONARY STENT INTERVENTION  LEFT HEART CATH AND CORS/GRAFTS ANGIOGRAPHY  Conclusion    CULPRIT LESION: SVG-OM2 graft was visualized by angiography and is normal in caliber. The graft exhibits severe diffuse disease. Origin to Mid Graft lesion is 100% stenosed.  Aspiration Thrombectomy &  Balloon angioplasty was performed using a BALLOON SAPPHIRE 2.5X20. -- Unable to restore flow (PCI aborted, attention turned to LM-LCx)  Post intervention, there is a 100% residual stenosis.  NATIVE LESION: Mid LM to Ost LAD lesion is 50% stenosed with 85% stenosed side branch in Ost Cx to Mid Cx.  A drug-eluting stent was successfully placed from LM-LCx using a SYNERGY XD 2.50X38. tapered post-dilation 3.1-2.6 mm  Post intervention, there is a 0% residual stenosis from LM-LCx. 2nd Mrg lesion is now 100% stenosed (ad been 90%) .  ------  Colon Flattery LAD to Mid LAD lesion is 60% stenosed with 50% stenosed side branch in 1st Diag. Mid LAD lesion is 100% stenosed.  Ramus lesion is 80% stenosed.  LIMA-LAD graft was visualized by angiography and is normal in caliber. The graft exhibits no disease.  SVG-RI graft was visualized by angiography and is normal in caliber. The graft exhibits no disease.  ------  Mid RCA to Dist RCA lesion is 100% stenosed.  RIMA-dRCA graft was visualized by angiography and is normal in caliber. The graft exhibits no disease.  -------------------------------------------------  There is mild left ventricular systolic dysfunction. The left ventricular ejection fraction is 45-50% by visual estimate.  There is mild (2+) mitral regurgitation.  LV end diastolic pressure is moderately elevated.   SUMMARY  Culprit lesion is occluded SVG-OM2--unable to open despite extensive angioplasty, aspiration thrombectomy and intravenous adenosine  Ostial-proximal LCx diffuse 80% -> successful DES PCI of LEFT MAIN into LCx using Synergy DES 2.5 mm 38 mm postdilated to 3.2 mm proximally and 2.6 mm distal.  Subtotal occluded LAD with patent pedicle LIMA-LAD  100% occluded mid RCA with patent pedicle RIMA-dRCA  Moderately elevated LVEDP  with low normal LVEF (difficult to assess wall motion)   RECOMMENDATION  Admit to CCU for overnight monitoring.  Start high-dose high  intensity statin  Continue beta-blocker, hold ACE inhibitor until at least tomorrow to reassess blood pressures.  With likely completion of his existing MI, would not consider fast-track discharge.   Diagnostic Dominance: Right  Intervention      History of Present Illness     Alex [name] is a 86 y.o. male with history of multivessel CAD status post CABG in 2000, carotid artery disease status post left CEA, hyperlipidemia, and hypertension admitted  with acute inferoposterior STEMI.  Presented to [facility] with severe chest pain of roughly 2-hour duration and was [name] to have inferior and posterior ST elevations on EKG. He was in his usual state of health with no major issues having been lost to follow-up from cardiology.   Previously followed by Alex [name], [place] at [facility] medication center and establised care with Dr. Gwenlyn [name] in 2016 for carotid artery disease what appears to be a high-grade 90+ percent stenosis at the origin of his left internal carotid artery. He underwent elective left carotid endarterectomy by Dr. Trula [name] 06/14/14.  Hospital Course     Consultants: None  1.  Acute inferoposterior STEMI  - Cath showed culprit lesion of occluded SVG-OM2--unable to open despite extensive angioplasty, aspiration thrombectomy and intravenous adenosine.  Otherwise LIMA to LAD and RIMA to RCA were patent. Ostial-proximal LCx diffuse 80% & subtotal occluded LAD -> successful DES PCI of LEFT MAIN into LCx using Synergy DES 2.5 mm 38 mm postdilated to 3.2 mm proximally and 2.6 mm distal. - No recurrent angina.  - Echocardiogram showed LVEF 45 to 50% with mild diastolic dysfunction.  - Continue aspirin, Lipitor, Toprol-XL, and Brilinta. - CRP II as outpatient   2.  HLD - 06/23/2019: Cholesterol 153; HDL 34; LDL Cholesterol 102; Triglycerides 83; VLDL 17  -  Not on statin as an outpatient - Started Lipitor 80 mg daily. - Consider OP f/u labs 6-8 weeks given statin initiation  this admission.  3.  Carotid artery disease status post left CEA - Follow-up carotid Dopplers from July 2020 showed 1 to 39% RICA stenosis and no significant stenosis on the left.  He is asymptomatic.  4.  Essential hypertension - blood pressure trending up.  Renal function stable.  - Continue BB. Restarted home Altace 10mg  qd  5. Diastolic dysfunction - Echo showed LVEF of 45-50% and grade 1 DD - trace edema on exam - will give PRN lasix - Advised to cut back on salt  Did the patient have an acute coronary syndrome (MI, NSTEMI, STEMI, etc) this admission?:  Yes                               AHA/ACC Clinical Performance & Quality Measures: 1. Aspirin prescribed? - Yes 2. ADP Receptor Inhibitor (Plavix/Clopidogrel, Brilinta/Ticagrelor or Effient/Prasugrel) prescribed (includes medically managed patients)? - Yes 3. Beta Blocker prescribed? - Yes 4. High Intensity Statin (Lipitor 40-80mg  or Crestor 20-40mg ) prescribed? - Yes 5. EF assessed during THIS hospitalization? - Yes 6. For EF <40%, was ACEI/ARB prescribed? - Yes 7. For EF <40%, Aldosterone Antagonist (Spironolactone or Eplerenone) prescribed? - Not Applicable (EF >/= [id number]) 8. Cardiac Rehab Phase II ordered (Included Medically managed Patients)? - Yes   _____________  Discharge Vitals Blood pressure 132/70, pulse 95, temperature 97.7 F (36.5 C), temperature source Oral,  resp. rate 20, height 5\' 10"  (1.778 m), weight 96.9 kg, SpO2 96 %.  Filed Weights   06/22/19 0726 06/22/19 1100 06/24/19 0400  Weight: 95.3 kg 101.1 kg 96.9 kg   Physical Exam  Constitutional: He is oriented to person, place, and time and well-developed, well-nourished, and in no distress.  HENT:  Head: Normocephalic and atraumatic.  Eyes: Pupils are equal, round, and reactive to light. EOM are normal.  Cardiovascular: Normal rate and regular rhythm.  Pulmonary/Chest: Effort normal and breath sounds normal.  Abdominal: Soft. Bowel sounds are normal.    Musculoskeletal:        General: Edema present.     Cervical back: Normal range of motion and neck supple.  Neurological: He is alert and oriented to person, place, and time.  Skin: Skin is warm and dry.  Psychiatric: Affect normal.    Labs & Radiologic Studies    CBC Recent Labs    06/24/19 0124 06/25/19 0123  WBC 8.9 9.3  HGB 13.0 13.2  HCT 38.3* 38.8*  MCV 96.2 96.3  PLT 165 [id number]   Basic Metabolic Panel Recent Labs    06/24/19 0124 06/25/19 0123  NA 138 137  K 3.6 3.5  CL 105 105  CO2 23 21*  GLUCOSE 109* 108*  BUN 11 13  CREATININE 0.95 1.03  CALCIUM 8.3* 8.5*   Liver Function Tests No results for input(s): AST, ALT, ALKPHOS, BILITOT, PROT, ALBUMIN in the last 72 hours. No results for input(s): LIPASE, AMYLASE in the last 72 hours. High Sensitivity Troponin:   Recent Labs  Lab 06/22/19 0729 06/22/19 1111 06/22/19 1612 06/22/19 2306 06/23/19 0549  TROPONINIHS 4 393* 9,320* >27,000* >27,000*    Hemoglobin A1C Recent Labs    06/23/19 0549  HGBA1C 5.9*   Fasting Lipid Panel Recent Labs    06/23/19 0549  CHOL 153  HDL 34*  LDLCALC 102*  TRIG 83  CHOLHDL 4.5   _____________  CARDIAC CATHETERIZATION  Result Date: 06/22/2019  CULPRIT LESION: SVG-OM2 graft was visualized by angiography and is normal in caliber. The graft exhibits severe diffuse disease. Origin to Mid Graft lesion is 100% stenosed.  Aspiration Thrombectomy & Balloon angioplasty was performed using a BALLOON SAPPHIRE 2.5X20. -- Unable to restore flow (PCI aborted, attention turned to LM-LCx)  Post intervention, there is a 100% residual stenosis.  NATIVE LESION: Mid LM to Ost LAD lesion is 50% stenosed with 85% stenosed side branch in Ost Cx to Mid Cx.  A drug-eluting stent was successfully placed from LM-LCx using a SYNERGY XD 2.50X38. tapered post-dilation 3.1-2.6 mm  Post intervention, there is a 0% residual stenosis from LM-LCx. 2nd Mrg lesion is now 100% stenosed (ad been 90%) .   ------  Colon Flattery LAD to Mid LAD lesion is 60% stenosed with 50% stenosed side branch in 1st Diag. Mid LAD lesion is 100% stenosed.  Ramus lesion is 80% stenosed.  LIMA-LAD graft was visualized by angiography and is normal in caliber. The graft exhibits no disease.  SVG-RI graft was visualized by angiography and is normal in caliber. The graft exhibits no disease.  ------  Mid RCA to Dist RCA lesion is 100% stenosed.  RIMA-dRCA graft was visualized by angiography and is normal in caliber. The graft exhibits no disease.  -------------------------------------------------  There is mild left ventricular systolic dysfunction. The left ventricular ejection fraction is 45-50% by visual estimate.  There is mild (2+) mitral regurgitation.  LV end diastolic pressure is moderately elevated.  SUMMARY  Culprit  lesion is occluded SVG-OM2--unable to open despite extensive angioplasty, aspiration thrombectomy and intravenous adenosine  Ostial-proximal LCx diffuse 80% -> successful DES PCI of LEFT MAIN into LCx using Synergy DES 2.5 mm 38 mm postdilated to 3.2 mm proximally and 2.6 mm distal.  Subtotal occluded LAD with patent pedicle LIMA-LAD  100% occluded mid RCA with patent pedicle RIMA-dRCA  Moderately elevated LVEDP with low normal LVEF (difficult to assess wall motion) RECOMMENDATION  Admit to CCU for overnight monitoring.  Start high-dose high intensity statin  Continue beta-blocker, hold ACE inhibitor until at least tomorrow to reassess blood pressures.  With likely completion of his existing MI, would not consider fast-track discharge. Alex [name], M.D., M.S. Interventional Cardiologist   DG Chest Port 1 View  Result Date: 06/22/2019 CLINICAL DATA:  Chest pain.  Stent placement. EXAM: PORTABLE CHEST 1 VIEW COMPARISON:  Chest x-ray 09/18/2018 report. FINDINGS: Prior CABG. Cardiomegaly. Bilateral mild interstitial prominence. Mild CHF cannot be excluded. No pleural effusion or pneumothorax.  Degenerative change thoracic spine. IMPRESSION: Prior CABG. Cardiomegaly. Mild bilateral interstitial prominence. Mild CHF cannot be excluded. Electronically Signed   By: Marcello Moores  Register   On: 06/22/2019 11:27   ECHOCARDIOGRAM COMPLETE  Result Date: 06/23/2019    ECHOCARDIOGRAM REPORT   Patient Name:   Alex [name] Date of Exam: 06/23/2019 Medical Rec #:  [id number]   Height:       70.0 in Accession #:    [id number]  Weight:       222.9 lb Date of Birth:  May 27, 1931   BSA:          2.186 m Patient Age:    27 years    BP:           146/81 mmHg Patient Gender: M           HR:           76 bpm. Exam Location:  Inpatient Procedure: 2D Echo, Cardiac Doppler, Color Doppler and Intracardiac            Opacification Agent Indications:    STEMI 121.3  History:        Patient has no prior history of Echocardiogram examinations.  Sonographer:    Merrie Roof RDCS Referring Phys: [place]  1. Left ventricular ejection fraction, by estimation, is 45 to 50%. The left ventricle has mildly decreased function. The left ventricle has no regional wall motion abnormalities. Left ventricular diastolic parameters are consistent with Grade I diastolic dysfunction (impaired relaxation).  2. Right ventricular systolic function is normal. The right ventricular size is normal.  3. The mitral valve is normal in structure and function. Mild mitral valve regurgitation. No evidence of mitral stenosis.  4. The aortic valve is normal in structure and function. Aortic valve regurgitation is trivial. No aortic stenosis is present.  5. The inferior vena cava is normal in size with greater than 50% respiratory variability, suggesting right atrial pressure of 3 mmHg. FINDINGS  Left Ventricle: Left ventricular ejection fraction, by estimation, is 45 to 50%. The left ventricle has mildly decreased function. The left ventricle has no regional wall motion abnormalities. Definity contrast agent was given IV to delineate the left  ventricular endocardial borders. The left ventricular internal cavity size was normal in size. There is no left ventricular hypertrophy. Left ventricular diastolic parameters are consistent with Grade I diastolic dysfunction (impaired relaxation).  LV Wall Scoring: The posterior wall and basal inferior segment are akinetic. Right Ventricle: The right ventricular  size is normal. No increase in right ventricular wall thickness. Right ventricular systolic function is normal. Left Atrium: Left atrial size was normal in size. Right Atrium: Right atrial size was normal in size. Pericardium: There is no evidence of pericardial effusion. Mitral Valve: The mitral valve is normal in structure and function. Normal mobility of the mitral valve leaflets. Mild mitral valve regurgitation. No evidence of mitral valve stenosis. Tricuspid Valve: The tricuspid valve is normal in structure. Tricuspid valve regurgitation is not demonstrated. No evidence of tricuspid stenosis. Aortic Valve: The aortic valve is normal in structure and function. Aortic valve regurgitation is trivial. No aortic stenosis is present. Pulmonic Valve: The pulmonic valve was normal in structure. Pulmonic valve regurgitation is not visualized. No evidence of pulmonic stenosis. Aorta: The aortic root is normal in size and structure. Venous: The inferior vena cava is normal in size with greater than 50% respiratory variability, suggesting right atrial pressure of 3 mmHg. IAS/Shunts: No atrial level shunt detected by color flow Doppler. Additional Comments: EF correlates with catheterization.  LEFT VENTRICLE PLAX 2D LVIDd:         4.40 cm LVIDs:         3.36 cm LV PW:         1.69 cm LV IVS:        1.60 cm  LV Volumes (MOD) LV vol d, MOD A4C: 89.8 ml LV vol s, MOD A4C: 46.2 ml LV SV MOD A4C:     89.8 ml RIGHT VENTRICLE          IVC RV Basal diam:  3.81 cm  IVC diam: 1.85 cm LEFT ATRIUM           Index       RIGHT ATRIUM           Index LA diam:      5.10 cm 2.33  cm/m  RA Area:     15.70 cm LA Vol (A4C): 68.5 ml 31.34 ml/m RA Volume:   31.50 ml  14.41 ml/m  AORTIC VALVE LVOT Vmax:   89.60 cm/s LVOT Vmean:  51.700 cm/s LVOT VTI:    0.179 m  AORTA Ao Root diam: 3.40 cm MITRAL VALVE MV Area (PHT): 4.68 cm    SHUNTS MV Decel Time: 162 msec    Systemic VTI: 0.18 m MV E velocity: 66.30 cm/s MV A velocity: 76.60 cm/s MV E/A ratio:  0.87 Candee Furbish MD Electronically signed by Candee Furbish MD Signature Date/Time: 06/23/2019/5:17:11 PM    Final    Disposition   Pt is being discharged home today in good condition.  Follow-up Plans & Appointments     Discharge Instructions    Amb Referral to Cardiac Rehabilitation   Complete by: As directed    Diagnosis:  STEMI Coronary Stents     After initial evaluation and assessments completed: Virtual Based Care may be provided alone or in conjunction with Phase 2 Cardiac Rehab based on patient barriers.: Yes   Diet - low sodium heart healthy   Complete by: As directed    Discharge instructions   Complete by: As directed    No driving for 2 weeks. No lifting over 10 lbs for 4 weeks. No sexual activity for 4 weeks. You may not return to work until cleared by your cardiologist. Keep procedure site clean & dry. If you notice increased pain, swelling, bleeding or pus, call/return!  You may shower, but no soaking baths/hot tubs/pools for 1 week.   Increase activity  slowly   Complete by: As directed       Discharge Medications   Allergies as of 06/25/2019   No Known Allergies     Medication List    STOP taking these medications   Potassium 99 MG Tabs     TAKE these medications   ascorbic acid 500 MG tablet Commonly known as: VITAMIN C Take 500-1,000 mg by mouth daily.   aspirin EC 81 MG tablet Take 81 mg by mouth daily. What changed: Another medication with the same name was removed. Continue taking this medication, and follow the directions you see here.   atorvastatin 80 MG tablet Commonly known as:  LIPITOR Take 1 tablet (80 mg total) by mouth daily at 6 PM.   b complex vitamins tablet Take 1 tablet by mouth daily.   Cinnamon 500 MG Tabs Take 500 mg by mouth 3 (three) times a week.   CITRUS BERGAMOT PO Take 1 capsule by mouth 2 (two) times a week.   Co Q 10 100 MG Caps Take 100 mg by mouth daily.   diazepam 5 MG tablet Commonly known as: VALIUM TAKE 1/2 TABLET BY MOUTH TWICE DAILY What changed: when to take this   doxepin 50 MG capsule Commonly known as: SINEQUAN TAKE 1 CAPSULE BY MOUTH AT BEDTIME AS NEEDED FOR INSOMNIA OR ANXIETY What changed:   how much to take  how to take this  when to take this  additional instructions   Fluticasone-Salmeterol 250-50 MCG/DOSE Aepb Commonly known as: ADVAIR Inhale 1 puff into the lungs once a week.   furosemide 20 MG tablet Commonly known as: Lasix Take 1 tablet (20 mg total) by mouth as needed for fluid or edema.   GINGER PO Take 1 capsule by mouth daily.   Grape Seed Oil Take 1 capsule by mouth daily.   GREEN TEA EXTRACT PO Take 1 capsule by mouth daily.   ibuprofen 200 MG tablet Commonly known as: ADVIL Take 200-400 mg by mouth every 6 (six) hours as needed for headache or mild pain.   metoprolol succinate 50 MG 24 hr tablet Commonly known as: TOPROL-XL TAKE 1 TABLET BY MOUTH ONCE DAILY TAKE WITH FOOD OR IMMEDIATELY FOLLOWING A MEAL What changed:   how much to take  how to take this  when to take this  additional instructions   nitroGLYCERIN 0.4 MG SL tablet Commonly known as: Nitrostat Place 1 tablet (0.4 mg total) under the tongue every 5 (five) minutes as needed.   ramipril 10 MG capsule Commonly known as: ALTACE TAKE 1 CAPSULE BY MOUTH EVERY DAY What changed: how much to take   Resveratrol 100 MG Caps Take 100 mg by mouth daily.   ticagrelor 90 MG Tabs tablet Commonly known as: BRILINTA Take 1 tablet (90 mg total) by mouth 2 (two) times daily.   TURMERIC PO Take 1 capsule by mouth  daily.   UNABLE TO FIND Take 1 capsule by mouth See admin instructions. [place] Mediterranean Nutrient capsules: Take 1 capsule by mouth once a day   VITAMIN B-12 PO Take 1 tablet by mouth daily.   Vitamin D3 125 MCG (5000 UT) Caps Take 5,000 Units by mouth daily.          Outstanding Labs/Studies  Consider OP f/u labs 6-8 weeks given statin initiation this admission.  Duration of Discharge Encounter   Greater than 30 minutes including physician time.  SignedCrista Luria [place], PA 06/25/2019, 11:04 AM  ATTENDING ATTESTATION  I have seen,  examined and evaluated the patient this AM on rounds together with Mr. Curly [place], [place].  After reviewing all the available data and chart, we discussed the patients laboratory, study & physical findings as well as symptoms in detail. I agree with his  findings, examination as well as impression, and discharge summary and recommendations as per our discussion.    He has done remarkably well following PCI of native LM-LCx to perfuse remaining OM branches of the LCx.  The grafted OM1 had initially had severe ostial proximal disease and was diffusely disease elsewhere from native angiography, but the graft was not able to be opened.  This is likely the culprit lesion, but no PCI options for further native OM and unable to restore flow in the graft.  As such, PCI was performed on the native vessel and he has not had any further angina.  Mild-moderately reduced EF but no heart failure symptoms besides mild edema.  He is feeling great, very thankful and happy, amazed at how quickly he felt better.  He had been off many of his medications for quite some time including statin and diuretic as well as moderate dose beta-blocker.  He had also been lost to follow-up from cardiology, had been seen by Alex [name], [place] at [facility] and had been referred to Dr. Gwenlyn [name] for his carotid disease years ago before being forwarded to Dr. Trula [name].  He has not  followed up with a cardiologist since.  When I discussed this with him, he pronounced that I was now his cardiologist.  He will therefore follow-up with with me after initially being seen by APP shortly post Discharge.  With plan for outpatient cardiac rehab referral.  He is stable for discharge today having walked up and down the hallway without any further symptoms.  Hemodynamically stable.   Alex [name], M.D., M.S. Interventional Cardiologist   Pager # [phone] Phone # [phone] [address] [place], [place] [address]

## 2019-06-26 ENCOUNTER — Encounter (HOSPITAL_COMMUNITY): Payer: Self-pay | Admitting: *Deleted

## 2019-06-26 ENCOUNTER — Encounter: Payer: Self-pay | Admitting: Thoracic Surgery (Cardiothoracic Vascular Surgery)

## 2019-06-26 NOTE — Progress Notes (Signed)
Referral received and verified for MD signature.  Follow up appt is 3/1. Insurance benefits and eligibility to be determined upon the satisfactory completion of his follow up appt on 07/02/19. Cherre Huger, BSN Cardiac and Training and development officer

## 2019-06-26 NOTE — Telephone Encounter (Signed)
Patient contacted regarding discharge from [facility] on 2/22.  Patient understands to follow up with provider Jory Sims, NP on 3/1 at 11:15 at [facility]. Patient understands discharge instructions? Yes Patient understands medications and regiment? Yes Patient understands to bring all medications to this visit? Yes

## 2019-06-27 ENCOUNTER — Telehealth: Payer: Self-pay | Admitting: *Deleted

## 2019-06-27 NOTE — Telephone Encounter (Signed)
Transition Care Management Follow-up Telephone Call  Date of discharge and from where: 06/25/19 [place]  How have you been since you were released from the hospital? Better just have a lot of paperwork  Any questions or concerns? No   Items Reviewed:  Did the pt receive and understand the discharge instructions provided? Yes   Medications obtained and verified? Yes   Any new allergies since your discharge? No   Dietary orders reviewed? Yes  Do you have support at home? Yes   Other (ie: DME, Home Health, etc) No Home Health  Functional Questionnaire: (I = Independent and D = Dependent) ADL's: I  Bathing/Dressing- I   Meal Prep- I  Eating- I  Maintaining continence- I  Transferring/Ambulation- I  Managing Meds- I   Follow up appointments reviewed:    PCP Hospital f/u appt confirmed? Yes  Scheduled to see Dr. Mariea Clonts on 07/09/19 @ 11 (Cannot be seen sooner because he cannot drive for 2 weeks after discharge, has no one to bring him to office).  [facility] f/u appt confirmed? Yes  Scheduled to see Cardiologist on 07/02/19.  Are transportation arrangements needed? No   If their condition worsens, is the pt aware to call  their PCP or go to the ED? Yes  Was the patient provided with contact information for the PCP's office or ED? Yes  Was the pt encouraged to call back with questions or concerns? Yes

## 2019-06-29 ENCOUNTER — Telehealth (HOSPITAL_COMMUNITY): Payer: Self-pay

## 2019-06-29 ENCOUNTER — Other Ambulatory Visit: Payer: Self-pay | Admitting: Family

## 2019-06-29 DIAGNOSIS — I10 Essential (primary) hypertension: Secondary | ICD-10-CM

## 2019-06-29 NOTE — Telephone Encounter (Signed)
Pt insurance is active and benefits verified through [employer] $10, DED 0/0 met, out of pocket $3,900/0 met, co-insurance 0%. no pre-authorization required. Passport, 06/29/2019_0 :52am, REF# [id number]  Will contact patient to see if he is interested in the Cardiac Rehab Program. If interested, patient will need to complete follow up appt. Once completed, patient will be contacted for scheduling upon review by the RN Navigator.

## 2019-07-01 NOTE — Progress Notes (Addendum)
Cardiology Office Note  Date: 07/02/2019   ID: CRAIG WESTERMANN, DOB 1932/05/02, MRN [id number]  PCP:  Gayland Curry, DO  Cardiologist:  Glenetta Hew, MD Electrophysiologist:  None   Chief Complaint  Patient presents with  . Follow-up  . Chest Pain    Now.    History of Present Illness: Alex [name] is a 86 y.o. male  Last known visit in 2016 with Dr. Gwenlyn Found.  History of CAD with CABG in 2000.Marland Kitchen  HTN, HLD, CAS status post left carotid endarterectomy for April 2016.  Carotid Doppler studies performed August 2016 showed widely patent endarterectomy site.  Here for F/U S/P Posterior STEMI d/t occlusion of postero-lateral branch of RCA 06/22/2019.HS TROP 4>393>9320>27000>27000.  He presented to the emergency room on June 22, 2019 with complaints of chest pain which began 2-3 hours prior to arrival.  He stated the pain was in his mid chest.  He was found to have inferior and posterior ST elevations on EKG. A code STEMI was called at 7:30 AM. HS TROP 4>393>9320>27000>27000.   PCI 06/22/2019 ; SVG-OM2 unable to intervene 100% in spite of extensive angioplasty, aspiration thrombectomy and IV adenosine.  Ost-Prox LCX diffuse 80%. DES of LM - LCX  Subtotal occluded LAD w/ patent pedicle LIMA-LAD Mid RCA 100% with patent pedicle RIMA-dRCA  Patient denies any recent issues status post stent placement.  His right groin access site is clean and dry.  Right groin access dressing was removed.  He has no evidence of pseudocyst or hematoma.  He does have some ecchymosis surrounding the area.  He denies any pain or discomfort.  He denies any anginal or exertional symptoms.  States the same day after the cardiac catheterization he was up and walking around and feeling much better after stent was placed.  He states he has occasional cramping around his body which is a chronic problem.  States he also has chronic inner ear issues and takes diazepam for sensation of dizziness. No recent episodes.  States he is  overweight and needs to lose some weight.  States he has been walking some around [employer] for exercise.   Past Medical History:  Diagnosis Date  . Acquired deformity of nose   . Calculus of kidney   . Cellulitis and abscess of unspecified site   . Cervicalgia   . CHF (congestive heart failure) ([place])   . Dizziness and giddiness   . Encounter for long-term (current) use of other medications   . Esophagitis, unspecified   . Hyperlipidemia   . Hypertrophy of prostate with urinary obstruction and other lower urinary tract symptoms (LUTS)   . Insomnia, unspecified   . Internal hemorrhoids without mention of complication   . Left carotid artery stenosis    S/P L CEA - 2015 (Dr. Trula Slade)  . Multiple vessel coronary artery disease 2000   -> CABG X 4 (LIMA-LAD, RIMA-dRCA, SVG-OM2, SVG-RI) (Dr. Roxan Hockey)  . Obesity, unspecified   . Old myocardial infarction   . Other and unspecified hyperlipidemia   . Palpitations   . 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 Slowing of urinary stream   . Special screening for malignant neoplasm of prostate   . Unspecified essential hypertension   . 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 Unspecified vitamin D deficiency   . Urinary frequency     Past Surgical History:  Procedure Laterality Date  .  CARDIAC CATHETERIZATION    . CAROTID ENDARTERECTOMY Left 06-14-14   CE  . COLONOSCOPY  2007   Dr Lajoyce Corners, normal  . CORONARY ARTERY BYPASS GRAFT  2000   Dr. Roxan Hockey - CABG x 4:  LIMA-LAD, RIMA-dRCA, SVG-OM2, SVG-RI  . CORONARY STENT INTERVENTION N/A 06/22/2019   Procedure: CORONARY STENT INTERVENTION;  Surgeon: Leonie Man, MD;  Location: [facility];  Service: Cardiovascular;  Laterality: N/A;  . CORONARY/GRAFT ACUTE MI REVASCULARIZATION N/A 06/22/2019   Procedure: CORONARY/GRAFT ACUTE MI REVASCULARIZATION;  Surgeon: Leonie Man, MD;  Location: [facility];  Service: Cardiovascular;  Laterality: N/A;  . ENDARTERECTOMY Left 06/14/2014   Procedure: LEFT CAROTID ENDARTERECTOMY WITH VASCUGUARD PATCH ANGIOPLASTY;  Surgeon: Serafina Mitchell, MD;  Location: [facility];  Service: Vascular;  Laterality: Left;  . ESOPHAGOGASTRODUODENOSCOPY ENDOSCOPY  2007   Dr Lajoyce Corners, with biopsy  . EXCISIONAL HEMORRHOIDECTOMY  1981  . LEFT HEART CATH AND CORS/GRAFTS ANGIOGRAPHY N/A 06/22/2019   Procedure: LEFT HEART CATH AND CORS/GRAFTS ANGIOGRAPHY;  Surgeon: Leonie Man, MD;  Location: [facility];  Service: Cardiovascular;  Laterality: N/A;  . LESION FROM FOREHEAD,(R) EAR (L) ARM     DR Almira Coaster  . LITHOTRIPSY  2005   Dr Almira Coaster  . TONSILLECTOMY  1940  . trench in right side of jaw after abcesses  06/01/2016    Current Outpatient Medications  Medication Sig Dispense Refill  . ascorbic acid (VITAMIN C) 500 MG tablet Take 500-1,000 mg by mouth daily.    Marland Kitchen aspirin EC 81 MG tablet Take 81 mg by mouth daily.    Marland Kitchen atorvastatin (LIPITOR) 80 MG tablet Take 1 tablet (80 mg total) by mouth daily at 6 PM. 30 tablet 6  . b complex vitamins tablet Take 1 tablet by mouth daily.    . Cholecalciferol (VITAMIN D3) 125 MCG (5000 UT) CAPS Take 5,000 Units by mouth daily.    . Cinnamon 500 MG TABS Take 500 mg by mouth 3 (three) times a week.     Marland Kitchen CITRUS BERGAMOT PO Take 1 capsule by mouth 2 (two) times a week.     . Coenzyme Q10 (CO Q 10) 100 MG CAPS Take 100 mg by mouth daily.     . Cyanocobalamin (VITAMIN B-12 PO) Take 1 tablet by mouth daily.    . diazepam (VALIUM) 5 MG tablet TAKE 1/2 TABLET BY MOUTH TWICE DAILY (Patient taking differently: Take 2.5 mg by mouth at bedtime. ) 30 tablet 5  . doxepin (SINEQUAN) 50 MG capsule TAKE 1 CAPSULE BY MOUTH AT BEDTIME AS NEEDED FOR INSOMNIA OR ANXIETY (Patient taking differently: Take 50 mg by mouth at bedtime. ) 90 capsule 0  . Fluticasone-Salmeterol (ADVAIR) 250-50 MCG/DOSE AEPB Inhale 1 puff into the lungs once a  week.     . furosemide (LASIX) 20 MG tablet Take 1 tablet (20 mg total) by mouth as needed for fluid or edema. 30 tablet 2  . Ginger, Zingiber officinalis, (GINGER PO) Take 1 capsule by mouth daily.    . Grape Seed OIL Take 1 capsule by mouth daily.    Nyoka Cowden Tea, Camellia sinensis, (GREEN TEA EXTRACT PO) Take 1 capsule by mouth daily.    Marland Kitchen ibuprofen (ADVIL) 200 MG tablet Take 200-400 mg by mouth every 6 (six) hours as needed for headache or mild pain.    . metoprolol succinate (TOPROL-XL) 50 MG 24 hr tablet TAKE 1 TABLET BY MOUTH EVERY DAY TAKE WITH FOOD OR  IMMEDIATELY FOLLOWING A MEAL 90 tablet 1  . nitroGLYCERIN (NITROSTAT) 0.4 MG SL tablet Place 1 tablet (0.4 mg total) under the tongue every 5 (five) minutes as needed. 25 tablet 12  . ramipril (ALTACE) 10 MG capsule TAKE 1 CAPSULE BY MOUTH EVERY DAY (Patient taking differently: Take 10 mg by mouth daily. ) 90 capsule 1  . RESVERATROL 100 MG CAPS Take 100 mg by mouth daily.     . ticagrelor (BRILINTA) 90 MG TABS tablet Take 1 tablet (90 mg total) by mouth 2 (two) times daily. 60 tablet 11  . TURMERIC PO Take 1 capsule by mouth daily.    Marland Kitchen UNABLE TO FIND Take 1 capsule by mouth See admin instructions. Swanson Mediterranean Nutrient capsules: Take 1 capsule by mouth once a day     No current facility-administered medications for this visit.   Allergies:  Patient has no known allergies.   Social History: The patient  reports that he quit smoking about 46 years ago. His smoking use included cigarettes. He has a 50.00 pack-year smoking history. He has never used smokeless tobacco. He reports current alcohol use of about 4.0 standard drinks of alcohol per week. He reports that he does not use drugs.   Family History: The patient's family history includes Cancer in his brother, daughter, and mother.   ROS:  Please see the history of present illness. Otherwise, complete review of systems is positive for none.  All other systems are reviewed and  negative.   Physical Exam: VS:  BP 118/66 (BP Location: Left Arm, Patient Position: Sitting, Cuff Size: Normal)   Pulse 72   Ht 5\' 9"  (1.753 m)   Wt 218 lb (98.9 kg)   BMI 32.19 kg/m , BMI Body mass index is 32.19 kg/m.  Wt Readings from Last 3 Encounters:  07/02/19 218 lb (98.9 kg)  06/24/19 213 lb 10 oz (96.9 kg)  01/18/19 212 lb 12.8 oz (96.5 kg)    General: Patient appears comfortable at rest. Neck: Supple, no elevated JVP or carotid bruits, no thyromegaly. Lungs: Clear to auscultation, nonlabored breathing at rest. Cardiac: Regular rate and rhythm, no S3 or significant systolic murmur, no pericardial rub. Extremities: No pitting edema, distal pulses 2+. Skin: Warm and dry.  Right groin access site clean and dry with no redness swelling or evidence of hematoma/pseudocyst.  Some ecchymosis surrounding the area Musculoskeletal: No kyphosis. Neuropsychiatric: Alert and oriented x3, affect grossly appropriate.  ECG:  An ECG dated 07/02/2019 was personally reviewed today and demonstrated:  Normal sinus rhythm rate of 72, inferior/posterior infarct age undetermined.  Recent Labwork: 06/22/2019: ALT 17; AST 21 06/25/2019: BUN 13; Creatinine, Ser 1.03; Hemoglobin 13.2; Platelets 183; Potassium 3.5; Sodium 137     Component Value Date/Time   CHOL 153 06/23/2019 0549   CHOL 193 08/25/2015 0849   TRIG 83 06/23/2019 0549   HDL 34 (L) 06/23/2019 0549   HDL 43 08/25/2015 0849   CHOLHDL 4.5 06/23/2019 0549   VLDL 17 06/23/2019 0549   LDLCALC 102 (H) 06/23/2019 0549   LDLCALC 133 (H) 01/09/2019 0824    Other Studies Reviewed Today:  Cardiac catheterization 06/22/2019:  CULPRIT LESION: SVG-OM2 graft was visualized by angiography and is normal in caliber. The graft exhibits severe diffuse disease. Origin to Mid Graft lesion is 100% stenosed.  Aspiration Thrombectomy & Balloon angioplasty was performed using a BALLOON SAPPHIRE 2.5X20. -- Unable to restore flow (PCI aborted, attention  turned to LM-LCx)  Post intervention, there is a 100%  residual stenosis.  NATIVE LESION: Mid LM to Ost LAD lesion is 50% stenosed with 85% stenosed side branch in Ost Cx to Mid Cx.  A drug-eluting stent was successfully placed from LM-LCx using a SYNERGY XD 2.50X38. tapered post-dilation 3.1-2.6 mm  Post intervention, there is a 0% residual stenosis from LM-LCx. 2nd Mrg lesion is now 100% stenosed (ad been 90%) .  ------  Colon Flattery LAD to Mid LAD lesion is 60% stenosed with 50% stenosed side branch in 1st Diag. Mid LAD lesion is 100% stenosed.  Ramus lesion is 80% stenosed.  LIMA-LAD graft was visualized by angiography and is normal in caliber. The graft exhibits no disease.  SVG-RI graft was visualized by angiography and is normal in caliber. The graft exhibits no disease.  ------  Mid RCA to Dist RCA lesion is 100% stenosed.  RIMA-dRCA graft was visualized by angiography and is normal in caliber. The graft exhibits no disease.  -------------------------------------------------  There is mild left ventricular systolic dysfunction. The left ventricular ejection fraction is 45-50% by visual estimate.  There is mild (2+) mitral regurgitation.  LV end diastolic pressure is moderately elevated.  SUMMARY  Culprit lesion is occluded SVG-OM2--unable to open despite extensive angioplasty, aspiration thrombectomy and intravenous adenosine  Ostial-proximal LCx diffuse 80% -> successful DES PCI of LEFT MAIN into LCx using Synergy DES 2.5 mm 38 mm postdilated to 3.2 mm proximally and 2.6 mm distal.  Subtotal occluded LAD with patent pedicle LIMA-LAD  100% occluded mid RCA with patent pedicle RIMA-dRCA.Moderately elevated LVEDP with low normal LVEF (difficult to assess wall motion)  Dominance: Right  Intervention      Echocardiogram 06/23/2019  1. Left ventricular ejection fraction, by estimation, is 45 to 50%. The left ventricle has mildly decreased function. The left ventricle  has no regional wall motion abnormalities. Left ventricular diastolic parameters are consistent with Grade I diastolic dysfunction (impaired relaxation). 2. Right ventricular systolic function is normal. The right ventricular size is normal. 3. The mitral valve is normal in structure and function. Mild mitral valve regurgitation. No evidence of mitral stenosis. 4. The aortic valve is normal in structure and function. Aortic valve regurgitation is trivial. No aortic stenosis is present. 5. The inferior vena cava is normal in size with greater than 50% respiratory variability, suggesting right atrial pressure of 3 mmHg. Left Ventricle: Left ventricular ejection fraction, by estimation, is 45 to 50%. The left ventricle has mildly decreased function. The left ventricle has no regional wall motion abnormalities. Definity contrast agent was given IV to delineate the left ventricular endocardial borders. The left ventricular internal cavity size was normal in size. There is no left ventricular hypertrophy. Left ventricular diastolic parameters are consistent with Grade I diastolic dysfunction (impaired relaxation  Carotid artery duplex 11/27/2018 Summary:  Right Carotid: Velocities in the right ICA are consistent with a 1-39% stenosis.  Left Carotid: There is no evidence of stenosis in the left ICA.  Vertebrals: Bilateral vertebral arteries demonstrate antegrade flow.  Subclavians: Normal flow hemodynamics were seen in bilateral subclavian arteries.    CABG 09/15/1998   Assessment and Plan:  1. ST elevation myocardial infarction (STEMI) of true posterior wall, initial episode of care ([place])   2. CAD in native artery   3. Essential hypertension   4. Mixed hyperlipidemia   5. Left carotid artery stenosis - s/p LCEA    1. ST elevation myocardial infarction (STEMI) of true posterior wall, initial episode of care [facility]) recent presentation to the emergency room with chest  pain beginning of  proximately 2 to 3 hours prior to arrival.  Patient was noted to have ST elevation with diagnosis of posterior STEMI.  PCI to DES of LM-LCx.  Doing well status post MI.  Denies any progressive anginal or exertional symptoms.  Right groin catheter access site clean and dry without evidence of hematoma or pseudocyst.  Has some surrounding ecchymosis.  Dressing was removed.  Patient states he is walking during the week at [employer] for exercise with no major issues.  States he has a hard time getting started in the morning but once he is up and moving around he feels better.   2. CAD in native artery History of CABG x3 by Dr. Roxan Hockey in 2000.  Recent PCI on 06/22/2019: SVG-OM2 unable to intervene 100% in spite of extensive angioplasty, aspiration thrombectomy and IV adenosine. Ost-Prox LCX diffuse 80%. DES of LM - LCX.Subtotal occluded LAD w/ patent pedicle LIMA-LADMid RCA 100% with patent pedicle RIMA-dRCA.  Continue aspirin 81 mg, Brilinta 90 mg p.o. twice daily, metoprolol succinate 50 mg daily.  Continue nitroglycerin sublingual as needed.  Will get CBC at next visit..  5. Essential hypertension Blood pressure 118/66.  Continue ramipril 10 mg daily.  Continue Lasix 20 mg as needed for fluid or edema.  Get basic metabolic panel at next visit.  4. Mixed hyperlipidemia Recently started on high-dose statin therapy.  Continue atorvastatin 80 mg p.o. daily.  Will get FLP's and LFTs at next visit.  5. Left carotid artery stenosis - s/p LCEA History of left carotid endarterectomy.  Recent carotid artery study 11/27/2018 showed 1 to 39% right ICA stenosis.  Left ICA showed normal flow without stenosis.  We will continue with yearly carotid artery duplex studies.  Medication Adjustments/Labs and Tests Ordered: Current medicines are reviewed at length with the patient today.  Concerns regarding medicines are outlined above.   Disposition : Dr Ellyn Hack or APP 3 months  Signed, Levell July,  NP 07/02/2019 11:39 AM    [place]

## 2019-07-02 ENCOUNTER — Encounter: Payer: Self-pay | Admitting: Adult Health

## 2019-07-02 ENCOUNTER — Other Ambulatory Visit: Payer: Self-pay

## 2019-07-02 ENCOUNTER — Ambulatory Visit (INDEPENDENT_AMBULATORY_CARE_PROVIDER_SITE_OTHER): Payer: Medicare HMO | Admitting: Adult Health

## 2019-07-02 VITALS — BP 118/66 | HR 72 | Ht 69.0 in | Wt 218.0 lb

## 2019-07-02 DIAGNOSIS — E782 Mixed hyperlipidemia: Secondary | ICD-10-CM

## 2019-07-02 DIAGNOSIS — I10 Essential (primary) hypertension: Secondary | ICD-10-CM

## 2019-07-02 DIAGNOSIS — I6522 Occlusion and stenosis of left carotid artery: Secondary | ICD-10-CM

## 2019-07-02 DIAGNOSIS — I2129 ST elevation (STEMI) myocardial infarction involving other sites: Secondary | ICD-10-CM | POA: Diagnosis not present

## 2019-07-02 DIAGNOSIS — I2510 Atherosclerotic heart disease of native coronary artery without angina pectoris: Secondary | ICD-10-CM

## 2019-07-02 NOTE — Patient Instructions (Signed)
Medication Instructions:  Continue current medications  *If you need a refill on your cardiac medications before your next appointment, please call your pharmacy*   Lab Work: CBC, BMP, Fasting Lipid and Liver  If you have labs (blood work) drawn today and your tests are completely normal, you will receive your results only by: Marland Kitchen MyChart Message (if you have MyChart) OR . A paper copy in the mail If you have any lab test that is abnormal or we need to change your treatment, we will call you to review the results.   Testing/Procedures: None Ordered   Follow-Up: At [facility], you and your health needs are our priority.  As part of our continuing mission to provide you with exceptional heart care, we have created designated Provider Care Teams.  These Care Teams include your primary Cardiologist (physician) and Advanced Practice Providers (APPs -  Physician Assistants and Nurse Practitioners) who all work together to provide you with the care you need, when you need it.  We recommend signing up for the patient portal called "MyChart".  Sign up information is provided on this After Visit Summary.  MyChart is used to connect with patients for Virtual Visits (Telemedicine).  Patients are able to view lab/test results, encounter notes, upcoming appointments, etc.  Non-urgent messages can be sent to your provider as well.   To learn more about what you can do with MyChart, go to [email or web address].    Your next appointment:   3 month(s)  The format for your next appointment:   In Person  Provider:   You may see Glenetta Hew, MD or one of the following Advanced Practice Providers on your designated Care Team:    Rosaria Ferries, PA-C  Jory Sims, DNP, ANP  Cadence Kathlen Mody, NP

## 2019-07-09 ENCOUNTER — Other Ambulatory Visit: Payer: Self-pay | Admitting: Internal Medicine

## 2019-07-09 ENCOUNTER — Encounter: Payer: Self-pay | Admitting: Internal Medicine

## 2019-07-09 DIAGNOSIS — I10 Essential (primary) hypertension: Secondary | ICD-10-CM

## 2019-07-11 NOTE — Telephone Encounter (Signed)
Pt stated that he is not ready to start Cardiac Rehab and will call back when he is ready. Advised pt that I will closed his referral and he can call when he is ready.  Closed referral.

## 2019-07-12 ENCOUNTER — Ambulatory Visit (INDEPENDENT_AMBULATORY_CARE_PROVIDER_SITE_OTHER): Payer: Medicare HMO | Admitting: Internal Medicine

## 2019-07-12 ENCOUNTER — Other Ambulatory Visit: Payer: Self-pay

## 2019-07-12 ENCOUNTER — Encounter: Payer: Self-pay | Admitting: Internal Medicine

## 2019-07-12 VITALS — BP 112/64 | HR 81 | Temp 97.7°F | Ht 69.0 in | Wt 217.0 lb

## 2019-07-12 DIAGNOSIS — Z6830 Body mass index (BMI) 30.0-30.9, adult: Secondary | ICD-10-CM | POA: Diagnosis not present

## 2019-07-12 DIAGNOSIS — I25810 Atherosclerosis of coronary artery bypass graft(s) without angina pectoris: Secondary | ICD-10-CM

## 2019-07-12 DIAGNOSIS — E66811 Obesity, class 1: Secondary | ICD-10-CM

## 2019-07-12 DIAGNOSIS — E6609 Other obesity due to excess calories: Secondary | ICD-10-CM | POA: Diagnosis not present

## 2019-07-12 DIAGNOSIS — E785 Hyperlipidemia, unspecified: Secondary | ICD-10-CM | POA: Diagnosis not present

## 2019-07-12 DIAGNOSIS — I779 Disorder of arteries and arterioles, unspecified: Secondary | ICD-10-CM

## 2019-07-12 DIAGNOSIS — R7303 Prediabetes: Secondary | ICD-10-CM

## 2019-07-12 DIAGNOSIS — Z6832 Body mass index (BMI) 32.0-32.9, adult: Secondary | ICD-10-CM

## 2019-07-12 NOTE — Patient Instructions (Addendum)
Please bring [place] a copy of your living will and health care power of attorney.  I recommend you gradually increase your walking steps from 2400 up to a 7000 steps.  Increase by 100 steps each week until Alex [name] knows your life story (haha ;).    Please go to your pharmacy to get your tdap (tetanus and diptheria) booster.

## 2019-07-12 NOTE — Progress Notes (Signed)
Location:  [facility] Provider: Merdis Snodgrass L. Mariea Clonts, D.O., C.M.D.  Goals of Care:  Advanced Directives 07/12/2019  Does Patient Have a Medical Advance Directive? Yes  Type of Paramedic of [place];Living will  Does patient want to make changes to medical advance directive? No - Patient declined  Copy of [place] in Chart? Yes - validated most recent copy scanned in chart (See row information)   Chief Complaint  Patient presents with  . Hospitalization Follow-up    2/19-2/22 Hospital follow-up for ST elevation myocardial Infarction   . Immunizations    Discuss need for TD/Tdap. Patient had covid vaccine yesterday     HPI: Patient is a 86 y.o. male seen today for hospital follow-up s/p admission from 2/19-2/22 for STEMI--could not have TOC earlier b/c he could not drive after his heart attack. He had bypass surgery 20 years ago with 6 grafts.  He started feeling a pain across his lower chest/upper abdomen--at first thought reflux, but then thought no.  His son called 27, he was taken to [facility].  He was in the ICU by 1pm and Dr. Ellyn Hack did his cath and one blocked vein could not be unblocked.  Stent was put in the vessel that bypassed it.    He feels weak, but he's walking more.  Yesterday, he got a covid shot at [employer].  He walked about 2400 steps yesterday.  Goes to [employer] for his walking.  Energy is slowly returning.    He is now taking brilinta which is important for him to stay on.  He's not taking the lipitor every day--maybe every other day.    Talks to me about apple cider vinegar and honey for congestion.  He uses 0.5mg  at night of diazepam for his prior dizzy spells many years ago--has been on since Dr. Nyoka Cowden.    He has a living will/hcpoa--his children are listed including sons and daughter.   His daughter is retired from [employer] and works in administration.  He is fully retired now.  Last tag sale was 3 years ago  and he struggled to sell the furniture and wound up donating to the red collection.    Labs from hospitalization reviewed.  He has f/u labs ordered by cardiology now.    Past Medical History:  Diagnosis Date  . Acquired deformity of nose   . Calculus of kidney   . Cellulitis and abscess of unspecified site   . Cervicalgia   . CHF (congestive heart failure) ([place])   . Dizziness and giddiness   . Encounter for long-term (current) use of other medications   . Esophagitis, unspecified   . Hyperlipidemia   . Hypertrophy of prostate with urinary obstruction and other lower urinary tract symptoms (LUTS)   . Insomnia, unspecified   . Internal hemorrhoids without mention of complication   . Left carotid artery stenosis    S/P L CEA - 2015 (Dr. Trula Slade)  . Multiple vessel coronary artery disease 2000   -> CABG X 4 (LIMA-LAD, RIMA-dRCA, SVG-OM2, SVG-RI) (Dr. Roxan Hockey)  . Obesity, unspecified   . Old myocardial infarction   . Other and unspecified hyperlipidemia   . Palpitations   . 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 Slowing of urinary stream   . Special screening for malignant neoplasm of prostate   . Unspecified essential hypertension   . 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 Unspecified vitamin D deficiency   . Urinary frequency     Past Surgical History:  Procedure Laterality Date  . CARDIAC CATHETERIZATION    . CAROTID ENDARTERECTOMY Left 06-14-14   CE  . COLONOSCOPY  2007   Dr Lajoyce Corners, normal  . CORONARY ARTERY BYPASS GRAFT  2000   Dr. Roxan Hockey - CABG x 4:  LIMA-LAD, RIMA-dRCA, SVG-OM2, SVG-RI  . CORONARY STENT INTERVENTION N/A 06/22/2019   Procedure: CORONARY STENT INTERVENTION;  Surgeon: Leonie Man, MD;  Location: [place] [facility];  Service: Cardiovascular;  Laterality: N/A;  . CORONARY/GRAFT ACUTE MI REVASCULARIZATION N/A 06/22/2019   Procedure: CORONARY/GRAFT ACUTE MI  REVASCULARIZATION;  Surgeon: Leonie Man, MD;  Location: [place] [facility];  Service: Cardiovascular;  Laterality: N/A;  . ENDARTERECTOMY Left 06/14/2014   Procedure: LEFT CAROTID ENDARTERECTOMY WITH VASCUGUARD PATCH ANGIOPLASTY;  Surgeon: Serafina Mitchell, MD;  Location: [place];  Service: Vascular;  Laterality: Left;  . ESOPHAGOGASTRODUODENOSCOPY ENDOSCOPY  2007   Dr Lajoyce Corners, with biopsy  . EXCISIONAL HEMORRHOIDECTOMY  1981  . LEFT HEART CATH AND CORS/GRAFTS ANGIOGRAPHY N/A 06/22/2019   Procedure: LEFT HEART CATH AND CORS/GRAFTS ANGIOGRAPHY;  Surgeon: Leonie Man, MD;  Location: [place] [facility];  Service: Cardiovascular;  Laterality: N/A;  . LESION FROM FOREHEAD,(R) EAR (L) ARM     DR Almira Coaster  . LITHOTRIPSY  2005   Dr Almira Coaster  . TONSILLECTOMY  1940  . trench in right side of jaw after abcesses  06/01/2016    No Known Allergies  Outpatient Encounter Medications as of 07/12/2019  Medication Sig  . ascorbic acid (VITAMIN C) 500 MG tablet Take 500-1,000 mg by mouth daily.  Marland Kitchen aspirin EC 81 MG tablet Take 81 mg by mouth daily.  Marland Kitchen atorvastatin (LIPITOR) 80 MG tablet Take 1 tablet (80 mg total) by mouth daily at 6 PM.  . b complex vitamins tablet Take 1 tablet by mouth daily.  . Cholecalciferol (VITAMIN D3) 125 MCG (5000 UT) CAPS Take 5,000 Units by mouth daily.  . Cinnamon 500 MG TABS Take 500 mg by mouth 3 (three) times a week.   . Coenzyme Q10 (CO Q 10) 100 MG CAPS Take 100 mg by mouth daily.   . Cyanocobalamin (VITAMIN B-12 PO) Take 1 tablet by mouth daily.  . diazepam (VALIUM) 5 MG tablet Take 2.5 mg by mouth at bedtime.  Marland Kitchen doxepin (SINEQUAN) 50 MG capsule TAKE 1 CAPSULE BY MOUTH AT BEDTIME AS NEEDED FOR INSOMNIA OR ANXIETY  . Fluticasone-Salmeterol (ADVAIR) 250-50 MCG/DOSE AEPB Inhale 1 puff into the lungs once a week.   . furosemide (LASIX) 20 MG tablet Take 20 mg by mouth daily. For fluid or swelling  . Grape Seed OIL Take 1 capsule by mouth daily.  Nyoka Cowden Tea,  Camellia sinensis, (GREEN TEA EXTRACT PO) Take 1 capsule by mouth daily.  Marland Kitchen ibuprofen (ADVIL) 200 MG tablet Take 200-400 mg by mouth every 6 (six) hours as needed for headache or mild pain.  . metoprolol succinate (TOPROL-XL) 50 MG 24 hr tablet TAKE 1 TABLET BY MOUTH EVERY DAY TAKE WITH FOOD OR IMMEDIATELY FOLLOWING A MEAL  . nitroGLYCERIN (NITROSTAT) 0.4 MG SL tablet Place 1 tablet (0.4 mg total) under the tongue every 5 (five) minutes as needed.  . ramipril (ALTACE) 10 MG capsule TAKE 1 CAPSULE BY MOUTH EVERY DAY  . RESVERATROL 100 MG CAPS Take 100 mg by mouth daily.   . ticagrelor (BRILINTA) 90 MG TABS  tablet Take 1 tablet (90 mg total) by mouth 2 (two) times daily.  . TURMERIC PO Take 1 capsule by mouth as needed.   Marland Kitchen UNABLE TO FIND Take 1 capsule by mouth See admin instructions. Swanson Mediterranean Nutrient capsules: Take 1 capsule by mouth once a day  . CITRUS BERGAMOT PO Take 1 capsule by mouth 2 (two) times a week.   . [DISCONTINUED] diazepam (VALIUM) 5 MG tablet TAKE 1/2 TABLET BY MOUTH TWICE DAILY  . [DISCONTINUED] furosemide (LASIX) 20 MG tablet Take 1 tablet (20 mg total) by mouth as needed for fluid or edema. (Patient not taking: Reported on 07/12/2019)  . [DISCONTINUED] Ginger, Zingiber officinalis, (GINGER PO) Take 1 capsule by mouth daily.   No facility-administered encounter medications on file as of 07/12/2019.    Review of Systems:  Review of Systems  Constitutional: Negative for chills and fever.  HENT: Positive for congestion and hearing loss. Negative for sore throat.   Eyes: Negative for blurred vision.  Respiratory: Positive for shortness of breath. Negative for cough.   Cardiovascular: Negative for chest pain, palpitations, orthopnea, leg swelling and PND.  Gastrointestinal: Negative for abdominal pain and constipation.  Genitourinary: Negative for dysuria, frequency and urgency.  Musculoskeletal: Negative for falls, joint pain and myalgias.  Neurological: Negative  for dizziness and loss of consciousness.  Endo/Heme/Allergies: Bruises/bleeds easily.  Psychiatric/Behavioral: Negative for depression and memory loss. The patient is nervous/anxious and has insomnia.        Word-finding challenges    Health Maintenance  Topic Date Due  . TETANUS/TDAP  05/03/2014  . INFLUENZA VACCINE  Completed  . PNA vac Low Risk Adult  Completed    Physical Exam: Vitals:   07/12/19 1536  BP: 112/64  Pulse: 81  Temp: 97.7 F (36.5 C)  TempSrc: Temporal  SpO2: 97%  Weight: 217 lb (98.4 kg)  Height: 5\' 9"  (1.753 m)   Body mass index is 32.05 kg/m. Physical Exam Vitals reviewed.  Constitutional:      General: He is not in acute distress.    Appearance: Normal appearance. He is obese. He is not toxic-appearing.  HENT:     Head: Normocephalic and atraumatic.  Eyes:     Comments: glasses  Cardiovascular:     Rate and Rhythm: Normal rate and regular rhythm.     Pulses: Normal pulses.     Heart sounds: Normal heart sounds.  Pulmonary:     Effort: Pulmonary effort is normal.     Breath sounds: Normal breath sounds. No wheezing, rhonchi or rales.  Abdominal:     General: Bowel sounds are normal.  Musculoskeletal:        General: Normal range of motion.     Right lower leg: No edema.     Left lower leg: No edema.  Neurological:     General: No focal deficit present.     Mental Status: He is alert and oriented to person, place, and time.     Cranial Nerves: No cranial nerve deficit.     Motor: No weakness.     Gait: Gait normal.  Psychiatric:        Mood and Affect: Mood normal.        Behavior: Behavior normal.     Labs reviewed: Basic Metabolic Panel: Recent Labs    06/23/19 0549 06/24/19 0124 06/25/19 0123  NA 138 138 137  K 3.9 3.6 3.5  CL 106 105 105  CO2 24 23 21*  GLUCOSE 107* 109* 108*  BUN 13 11 13   CREATININE 1.13 0.95 1.03  CALCIUM 8.2* 8.3* 8.5*   Liver Function Tests: Recent Labs    01/09/19 0824 06/22/19 0729  AST  15 21  ALT 12 17  ALKPHOS  --  35*  BILITOT 0.5 0.8  PROT 6.6 6.6  ALBUMIN  --  3.5   No results for input(s): LIPASE, AMYLASE in the last 8760 hours. No results for input(s): AMMONIA in the last 8760 hours. CBC: Recent Labs    01/09/19 0824 01/09/19 0824 06/22/19 0729 06/22/19 0808 06/23/19 0549 06/24/19 0124 06/25/19 0123  WBC 7.5   < > 9.6   < > 10.0 8.9 9.3  NEUTROABS 4,448  --  7.1  --   --   --   --   HGB 14.2   < > 14.0   < > 12.7* 13.0 13.2  HCT 42.1   < > 43.5   < > 38.3* 38.3* 38.8*  MCV 97.7   < > 101.6*   < > 97.7 96.2 96.3  PLT 247   < > 218   < > 162 165 183   < > = values in this interval not displayed.   Lipid Panel: Recent Labs    01/09/19 0824 06/22/19 0729 06/23/19 0549  CHOL 190 194 153  HDL 37* 43 34*  LDLCALC 133* 143* 102*  TRIG 94 41 83  CHOLHDL 5.1* 4.5 4.5   Lab Results  Component Value Date   HGBA1C 5.9 (H) 06/23/2019    Procedures since last visit: CARDIAC CATHETERIZATION  Result Date: 06/22/2019  CULPRIT LESION: SVG-OM2 graft was visualized by angiography and is normal in caliber. The graft exhibits severe diffuse disease. Origin to Mid Graft lesion is 100% stenosed.  Aspiration Thrombectomy & Balloon angioplasty was performed using a BALLOON SAPPHIRE 2.5X20. -- Unable to restore flow (PCI aborted, attention turned to LM-LCx)  Post intervention, there is a 100% residual stenosis.  NATIVE LESION: Mid LM to Ost LAD lesion is 50% stenosed with 85% stenosed side branch in Ost Cx to Mid Cx.  A drug-eluting stent was successfully placed from LM-LCx using a SYNERGY XD 2.50X38. tapered post-dilation 3.1-2.6 mm  Post intervention, there is a 0% residual stenosis from LM-LCx. 2nd Mrg lesion is now 100% stenosed (ad been 90%) .  ------  Colon Flattery LAD to Mid LAD lesion is 60% stenosed with 50% stenosed side branch in 1st Diag. Mid LAD lesion is 100% stenosed.  Ramus lesion is 80% stenosed.  LIMA-LAD graft was visualized by angiography and is normal  in caliber. The graft exhibits no disease.  SVG-RI graft was visualized by angiography and is normal in caliber. The graft exhibits no disease.  ------  Mid RCA to Dist RCA lesion is 100% stenosed.  RIMA-dRCA graft was visualized by angiography and is normal in caliber. The graft exhibits no disease.  -------------------------------------------------  There is mild left ventricular systolic dysfunction. The left ventricular ejection fraction is 45-50% by visual estimate.  There is mild (2+) mitral regurgitation.  LV end diastolic pressure is moderately elevated.  SUMMARY  Culprit lesion is occluded SVG-OM2--unable to open despite extensive angioplasty, aspiration thrombectomy and intravenous adenosine  Ostial-proximal LCx diffuse 80% -> successful DES PCI of LEFT MAIN into LCx using Synergy DES 2.5 mm 38 mm postdilated to 3.2 mm proximally and 2.6 mm distal.  Subtotal occluded LAD with patent pedicle LIMA-LAD  100% occluded mid RCA with patent pedicle RIMA-dRCA  Moderately elevated LVEDP with low normal LVEF (  difficult to assess wall motion) RECOMMENDATION  Admit to CCU for overnight monitoring.  Start high-dose high intensity statin  Continue beta-blocker, hold ACE inhibitor until at least tomorrow to reassess blood pressures.  With likely completion of his existing MI, would not consider fast-track discharge. Glenetta Hew, M.D., M.S. Interventional Cardiologist   DG Chest Port 1 View  Result Date: 06/22/2019 CLINICAL DATA:  Chest pain.  Stent placement. EXAM: PORTABLE CHEST 1 VIEW COMPARISON:  Chest x-ray 09/18/2018 report. FINDINGS: Prior CABG. Cardiomegaly. Bilateral mild interstitial prominence. Mild CHF cannot be excluded. No pleural effusion or pneumothorax. Degenerative change thoracic spine. IMPRESSION: Prior CABG. Cardiomegaly. Mild bilateral interstitial prominence. Mild CHF cannot be excluded. Electronically Signed   By: Marcello Moores  Register   On: 06/22/2019 11:27   ECHOCARDIOGRAM  COMPLETE  Result Date: 06/23/2019    ECHOCARDIOGRAM REPORT   Patient Name:   Alex [name] Date of Exam: 06/23/2019 Medical Rec #:  [id number]   Height:       70.0 in Accession #:    [id number]  Weight:       222.9 lb Date of Birth:  1932-04-05   BSA:          2.186 m Patient Age:    7 years    BP:           146/81 mmHg Patient Gender: M           HR:           76 bpm. Exam Location:  Inpatient Procedure: 2D Echo, Cardiac Doppler, Color Doppler and Intracardiac            Opacification Agent Indications:    STEMI 121.3  History:        Patient has no prior history of Echocardiogram examinations.  Sonographer:    Merrie Roof RDCS Referring Phys: [place]  1. Left ventricular ejection fraction, by estimation, is 45 to 50%. The left ventricle has mildly decreased function. The left ventricle has no regional wall motion abnormalities. Left ventricular diastolic parameters are consistent with Grade I diastolic dysfunction (impaired relaxation).  2. Right ventricular systolic function is normal. The right ventricular size is normal.  3. The mitral valve is normal in structure and function. Mild mitral valve regurgitation. No evidence of mitral stenosis.  4. The aortic valve is normal in structure and function. Aortic valve regurgitation is trivial. No aortic stenosis is present.  5. The inferior vena cava is normal in size with greater than 50% respiratory variability, suggesting right atrial pressure of 3 mmHg. FINDINGS  Left Ventricle: Left ventricular ejection fraction, by estimation, is 45 to 50%. The left ventricle has mildly decreased function. The left ventricle has no regional wall motion abnormalities. Definity contrast agent was given IV to delineate the left ventricular endocardial borders. The left ventricular internal cavity size was normal in size. There is no left ventricular hypertrophy. Left ventricular diastolic parameters are consistent with Grade I diastolic dysfunction  (impaired relaxation).  LV Wall Scoring: The posterior wall and basal inferior segment are akinetic. Right Ventricle: The right ventricular size is normal. No increase in right ventricular wall thickness. Right ventricular systolic function is normal. Left Atrium: Left atrial size was normal in size. Right Atrium: Right atrial size was normal in size. Pericardium: There is no evidence of pericardial effusion. Mitral Valve: The mitral valve is normal in structure and function. Normal mobility of the mitral valve leaflets. Mild mitral valve regurgitation. No evidence of mitral valve  stenosis. Tricuspid Valve: The tricuspid valve is normal in structure. Tricuspid valve regurgitation is not demonstrated. No evidence of tricuspid stenosis. Aortic Valve: The aortic valve is normal in structure and function. Aortic valve regurgitation is trivial. No aortic stenosis is present. Pulmonic Valve: The pulmonic valve was normal in structure. Pulmonic valve regurgitation is not visualized. No evidence of pulmonic stenosis. Aorta: The aortic root is normal in size and structure. Venous: The inferior vena cava is normal in size with greater than 50% respiratory variability, suggesting right atrial pressure of 3 mmHg. IAS/Shunts: No atrial level shunt detected by color flow Doppler. Additional Comments: EF correlates with catheterization.  LEFT VENTRICLE PLAX 2D LVIDd:         4.40 cm LVIDs:         3.36 cm LV PW:         1.69 cm LV IVS:        1.60 cm  LV Volumes (MOD) LV vol d, MOD A4C: 89.8 ml LV vol s, MOD A4C: 46.2 ml LV SV MOD A4C:     89.8 ml RIGHT VENTRICLE          IVC RV Basal diam:  3.81 cm  IVC diam: 1.85 cm LEFT ATRIUM           Index       RIGHT ATRIUM           Index LA diam:      5.10 cm 2.33 cm/m  RA Area:     15.70 cm LA Vol (A4C): 68.5 ml 31.34 ml/m RA Volume:   31.50 ml  14.41 ml/m  AORTIC VALVE LVOT Vmax:   89.60 cm/s LVOT Vmean:  51.700 cm/s LVOT VTI:    0.179 m  AORTA Ao Root diam: 3.40 cm MITRAL VALVE MV  Area (PHT): 4.68 cm    SHUNTS MV Decel Time: 162 msec    Systemic VTI: 0.18 m MV E velocity: 66.30 cm/s MV A velocity: 76.60 cm/s MV E/A ratio:  0.87 Candee Furbish MD Electronically signed by Candee Furbish MD Signature Date/Time: 06/23/2019/5:17:11 PM    Final     Assessment/Plan 1. Coronary artery disease involving coronary bypass graft of native heart without angina pectoris -with prior 6 vessel cabg and now s/p cath and subsequent DES PCI of left main into left circumflex (could not get it with angioplasty, aspiration thrombectomy and IV adenosine) -counseled about importance of continuing all meds prescribed at the hospital for his heart disease and about healthy diet and exercise--he has made changes to his diet and continues walking at [employer] -I gave him a plan to increase his steps by 100 weekly until he reaches 7000 (doing 2400 now)  2. Prediabetes Lab Results  Component Value Date   HGBA1C 5.9 (H) 06/23/2019  counseled on diet and exercise  3. Hyperlipidemia, unspecified hyperlipidemia type -LDL has been above goal for a long time and he's always refused statins for me -he is now on lipitor but taking qod not qd  4. Class 1 obesity due to excess calories with serious comorbidity and body mass index (BMI) of 30.0 to 30.9 in adult -again counseled on diet and exercise for weight loss and to help lower cholesterol, sugar  5. BMI 32.0-32.9,adult -encouraged walking program as above  6. Bilateral carotid artery disease, unspecified type ([place]) -s/p left carotid artery CEA for 95% blockage in 2016--is following with vascular now and for carotid duplex in 18 mos  Labs/tests ordered:  Keep labs  as planned in sept  Next appt:  Sept for annual exam with fasting labs before  Pattricia Weiher L. Nykole Matos, D.O. [facility] [address], [place] [address] Cell Phone (Mon-Fri 8am-5pm):  [phone] On Call:  [phone] & follow prompts after  5pm & weekends Office Phone:  [phone] Office Fax:  [phone]

## 2019-07-15 ENCOUNTER — Encounter: Payer: Self-pay | Admitting: Cardiology

## 2019-07-17 ENCOUNTER — Encounter: Payer: Self-pay | Admitting: Cardiology

## 2019-07-23 ENCOUNTER — Other Ambulatory Visit: Payer: Self-pay | Admitting: Cardiology

## 2019-08-22 ENCOUNTER — Other Ambulatory Visit: Payer: Self-pay | Admitting: Internal Medicine

## 2019-09-14 ENCOUNTER — Ambulatory Visit (INDEPENDENT_AMBULATORY_CARE_PROVIDER_SITE_OTHER): Payer: Medicare HMO | Admitting: Family

## 2019-09-14 ENCOUNTER — Encounter: Payer: Self-pay | Admitting: Family

## 2019-09-14 ENCOUNTER — Other Ambulatory Visit: Payer: Self-pay

## 2019-09-14 DIAGNOSIS — Z0000 Encounter for general adult medical examination without abnormal findings: Secondary | ICD-10-CM | POA: Diagnosis not present

## 2019-09-14 DIAGNOSIS — Z23 Encounter for immunization: Secondary | ICD-10-CM | POA: Diagnosis not present

## 2019-09-14 MED ORDER — TETANUS-DIPHTH-ACELL PERTUSSIS 5-2.5-18.5 LF-MCG/0.5 IM SUSP: 0.5000 mL | Freq: Once | INTRAMUSCULAR | 0 refills | Status: AC

## 2019-09-14 NOTE — Patient Instructions (Signed)
Alex [name] , Thank you for taking time to come for your Medicare Wellness Visit. I appreciate your ongoing commitment to your health goals. Please review the following plan we discussed and let me know if I can assist you in the future.   Screening recommendations/referrals: Colonoscopy: Aged out  Recommended yearly ophthalmology/optometry visit for glaucoma screening and checkup Recommended yearly dental visit for hygiene and checkup  Vaccinations: Influenza vaccine: Up to date  Pneumococcal vaccine : Up to date  Tdap vaccine : Ordered today.please get vaccine at your pharmacy. Shingles vaccine: Declined.You may get shingrix vaccine at your pharmacy if desired.   Advanced directives: No   Conditions/risks identified: Advance Age male > 22 yrs,male gender,BMI > 30 and Hx of smoking   Next appointment: 1 year   Preventive Care 39 Years and Older, Male Preventive care refers to lifestyle choices and visits with your health care provider that can promote health and wellness. What does preventive care include?  A yearly physical exam. This is also called an annual well check.  Dental exams once or twice a year.  Routine eye exams. Ask your health care provider how often you should have your eyes checked.  Personal lifestyle choices, including:  Daily care of your teeth and gums.  Regular physical activity.  Eating a healthy diet.  Avoiding tobacco and drug use.  Limiting alcohol use.  Practicing safe sex.  Taking low doses of aspirin every day.  Taking vitamin and mineral supplements as recommended by your health care provider. What happens during an annual well check? The services and screenings done by your health care provider during your annual well check will depend on your age, overall health, lifestyle risk factors, and family history of disease. Counseling  Your health care provider may ask you questions about your:  Alcohol use.  Tobacco use.  Drug  use.  Emotional well-being.  Home and relationship well-being.  Sexual activity.  Eating habits.  History of falls.  Memory and ability to understand (cognition).  Work and work Statistician. Screening  You may have the following tests or measurements:  Height, weight, and BMI.  Blood pressure.  Lipid and cholesterol levels. These may be checked every 5 years, or more frequently if you are over 26 years old.  Skin check.  Lung cancer screening. You may have this screening every year starting at age 72 if you have a 30-pack-year history of smoking and currently smoke or have quit within the past 15 years.  Fecal occult blood test (FOBT) of the stool. You may have this test every year starting at age 21.  Flexible sigmoidoscopy or colonoscopy. You may have a sigmoidoscopy every 5 years or a colonoscopy every 10 years starting at age 80.  Prostate cancer screening. Recommendations will vary depending on your family history and other risks.  Hepatitis C blood test.  Hepatitis B blood test.  Sexually transmitted disease (STD) testing.  Diabetes screening. This is done by checking your blood sugar (glucose) after you have not eaten for a while (fasting). You may have this done every 1-3 years.  Abdominal aortic aneurysm (AAA) screening. You may need this if you are a current or former smoker.  Osteoporosis. You may be screened starting at age 66 if you are at high risk. Talk with your health care provider about your test results, treatment options, and if necessary, the need for more tests. Vaccines  Your health care provider may recommend certain vaccines, such as:  Influenza vaccine. This  is recommended every year.  Tetanus, diphtheria, and acellular pertussis (Tdap, Td) vaccine. You may need a Td booster every 10 years.  Zoster vaccine. You may need this after age 32.  Pneumococcal 13-valent conjugate (PCV13) vaccine. One dose is recommended after age  58.  Pneumococcal polysaccharide (PPSV23) vaccine. One dose is recommended after age 15. Talk to your health care provider about which screenings and vaccines you need and how often you need them. This information is not intended to replace advice given to you by your health care provider. Make sure you discuss any questions you have with your health care provider. Document Released: 05/16/2015 Document Revised: 01/07/2016 Document Reviewed: 02/18/2015 Elsevier Interactive Patient Education  2017 [place] Prevention in the Home Falls can cause injuries. They can happen to people of all ages. There are many things you can do to make your home safe and to help prevent falls. What can I do on the outside of my home?  Regularly fix the edges of walkways and driveways and fix any cracks.  Remove anything that might make you trip as you walk through a door, such as a raised step or threshold.  Trim any bushes or trees on the path to your home.  Use bright outdoor lighting.  Clear any walking paths of anything that might make someone trip, such as rocks or tools.  Regularly check to see if handrails are loose or broken. Make sure that both sides of any steps have handrails.  Any raised decks and porches should have guardrails on the edges.  Have any leaves, snow, or ice cleared regularly.  Use sand or salt on walking paths during winter.  Clean up any spills in your garage right away. This includes oil or grease spills. What can I do in the bathroom?  Use night lights.  Install grab bars by the toilet and in the tub and shower. Do not use towel bars as grab bars.  Use non-skid mats or decals in the tub or shower.  If you need to sit down in the shower, use a plastic, non-slip stool.  Keep the floor dry. Clean up any water that spills on the floor as soon as it happens.  Remove soap buildup in the tub or shower regularly.  Attach bath mats securely with double-sided  non-slip rug tape.  Do not have throw rugs and other things on the floor that can make you trip. What can I do in the bedroom?  Use night lights.  Make sure that you have a light by your bed that is easy to reach.  Do not use any sheets or blankets that are too big for your bed. They should not hang down onto the floor.  Have a firm chair that has side arms. You can use this for support while you get dressed.  Do not have throw rugs and other things on the floor that can make you trip. What can I do in the kitchen?  Clean up any spills right away.  Avoid walking on wet floors.  Keep items that you use a lot in easy-to-reach places.  If you need to reach something above you, use a strong step stool that has a grab bar.  Keep electrical cords out of the way.  Do not use floor polish or wax that makes floors slippery. If you must use wax, use non-skid floor wax.  Do not have throw rugs and other things on the floor that can make you  trip. What can I do with my stairs?  Do not leave any items on the stairs.  Make sure that there are handrails on both sides of the stairs and use them. Fix handrails that are broken or loose. Make sure that handrails are as long as the stairways.  Check any carpeting to make sure that it is firmly attached to the stairs. Fix any carpet that is loose or worn.  Avoid having throw rugs at the top or bottom of the stairs. If you do have throw rugs, attach them to the floor with carpet tape.  Make sure that you have a light switch at the top of the stairs and the bottom of the stairs. If you do not have them, ask someone to add them for you. What else can I do to help prevent falls?  Wear shoes that:  Do not have high heels.  Have rubber bottoms.  Are comfortable and fit you well.  Are closed at the toe. Do not wear sandals.  If you use a stepladder:  Make sure that it is fully opened. Do not climb a closed stepladder.  Make sure that both  sides of the stepladder are locked into place.  Ask someone to hold it for you, if possible.  Clearly mark and make sure that you can see:  Any grab bars or handrails.  First and last steps.  Where the edge of each step is.  Use tools that help you move around (mobility aids) if they are needed. These include:  Canes.  Walkers.  Scooters.  Crutches.  Turn on the lights when you go into a dark area. Replace any light bulbs as soon as they burn out.  Set up your furniture so you have a clear path. Avoid moving your furniture around.  If any of your floors are uneven, fix them.  If there are any pets around you, be aware of where they are.  Review your medicines with your doctor. Some medicines can make you feel dizzy. This can increase your chance of falling. Ask your doctor what other things that you can do to help prevent falls. This information is not intended to replace advice given to you by your health care provider. Make sure you discuss any questions you have with your health care provider. Document Released: 02/13/2009 Document Revised: 09/25/2015 Document Reviewed: 05/24/2014 Elsevier Interactive Patient Education  2017 [employer].

## 2019-09-14 NOTE — Progress Notes (Signed)
Subjective:   Alex [name] is a 86 y.o. male who presents for Medicare Annual/Subsequent preventive examination.  Review of Systems:  Cardiac Risk Factors include: advanced age (>39men, >42 women);hypertension;male gender;dyslipidemia;obesity (BMI >30kg/m2);smoking/ tobacco exposure     Objective:    Vitals: There were no vitals taken for this visit.  There is no height or weight on file to calculate BMI.  Advanced Directives 09/14/2019 07/12/2019 06/22/2019 09/12/2018 09/09/2017 01/10/2017 09/01/2016  Does Patient Have a Medical Advance Directive? No Yes Yes Yes Yes Yes Yes  Type of Advance Directive - [place];Living will Living will [place];Living will;Out of facility DNR (pink MOST or yellow form) [place];Living will [place];Living will [place];Living will  Does patient want to make changes to medical advance directive? - No - Patient declined No - Patient declined No - Patient declined No - Patient declined - -  Copy of [place] in Chart? - Yes - validated most recent copy scanned in chart (See row information) - - No - copy requested No - copy requested No - copy requested  Would patient like information on creating a medical advance directive? No - Patient declined - - - - - -    Tobacco Social History   Tobacco Use  Smoking Status Former Smoker  . Packs/day: 2.00  . Years: 25.00  . Pack years: 50.00  . Types: Cigarettes  . Quit date: 05/03/1973  . Years since quitting: 46.3  Smokeless Tobacco Never Used     Counseling given: Not Answered   Clinical Intake:  Pre-visit preparation completed: No  Pain : No/denies pain     BMI - recorded: 32.05 Nutritional Status: BMI > 30  Obese Nutritional Risks: None Diabetes: No  How often do you need to have someone help you when you read instructions, pamphlets, or other written materials from your doctor or  pharmacy?: 1 - Never What is the last grade level you completed in school?: 4 years College  Interpreter Needed?: No  Information entered by :: Alanee Ting FNP-C  Past Medical History:  Diagnosis Date  . Acquired deformity of nose   . Calculus of kidney   . Cellulitis and abscess of unspecified site   . Cervicalgia   . CHF (congestive heart failure) ([place])   . Dizziness and giddiness   . Encounter for long-term (current) use of other medications   . Esophagitis, unspecified   . Hyperlipidemia   . Hypertrophy of prostate with urinary obstruction and other lower urinary tract symptoms (LUTS)   . Insomnia, unspecified   . Internal hemorrhoids without mention of complication   . Left carotid artery stenosis    S/P L CEA - 2015 (Dr. Trula Slade)  . Multiple vessel coronary artery disease 2000   -> CABG X 4 (LIMA-LAD, RIMA-dRCA, SVG-OM2, SVG-RI) (Dr. Roxan Hockey)  . Obesity, unspecified   . Old myocardial infarction   . Other and unspecified hyperlipidemia   . Palpitations   . 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 Slowing of urinary stream   . Special screening for malignant neoplasm of prostate   . Unspecified essential hypertension   . 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 Unspecified vitamin D deficiency   . Urinary frequency    Past Surgical History:  Procedure Laterality Date  . CARDIAC CATHETERIZATION    .  CAROTID ENDARTERECTOMY Left 06-14-14   CE  . COLONOSCOPY  2007   Dr Lajoyce Corners, normal  . CORONARY ARTERY BYPASS GRAFT  2000   Dr. Roxan Hockey - CABG x 4:  LIMA-LAD, RIMA-dRCA, SVG-OM2, SVG-RI  . CORONARY STENT INTERVENTION N/A 06/22/2019   Procedure: CORONARY STENT INTERVENTION;  Surgeon: Leonie Man, MD;  Location: [facility];  Service: Cardiovascular;  Laterality: N/A;  . CORONARY/GRAFT ACUTE MI REVASCULARIZATION N/A 06/22/2019   Procedure: CORONARY/GRAFT ACUTE MI  REVASCULARIZATION;  Surgeon: Leonie Man, MD;  Location: [facility];  Service: Cardiovascular;  Laterality: N/A;  . ENDARTERECTOMY Left 06/14/2014   Procedure: LEFT CAROTID ENDARTERECTOMY WITH VASCUGUARD PATCH ANGIOPLASTY;  Surgeon: Serafina Mitchell, MD;  Location: [facility];  Service: Vascular;  Laterality: Left;  . ESOPHAGOGASTRODUODENOSCOPY ENDOSCOPY  2007   Dr Lajoyce Corners, with biopsy  . EXCISIONAL HEMORRHOIDECTOMY  1981  . LEFT HEART CATH AND CORS/GRAFTS ANGIOGRAPHY N/A 06/22/2019   Procedure: LEFT HEART CATH AND CORS/GRAFTS ANGIOGRAPHY;  Surgeon: Leonie Man, MD;  Location: [facility];  Service: Cardiovascular;  Laterality: N/A;  . LESION FROM FOREHEAD,(R) EAR (L) ARM     DR Almira Coaster  . LITHOTRIPSY  2005   Dr Almira Coaster  . TONSILLECTOMY  1940  . trench in right side of jaw after abcesses  06/01/2016   Family History  Problem Relation Age of Onset  . Cancer Mother        LIVER  . Cancer Brother        PROSTATE  . Cancer Daughter    Social History   Socioeconomic History  . Marital status: Widowed    Spouse name: Not on file  . Number of children: Not on file  . Years of education: Not on file  . Highest education level: Not on file  Occupational History  . Not on file  Tobacco Use  . Smoking status: Former Smoker    Packs/day: 2.00    Years: 25.00    Pack years: 50.00    Types: Cigarettes    Quit date: 05/03/1973    Years since quitting: 46.3  . Smokeless tobacco: Never Used  Substance and Sexual Activity  . Alcohol use: Yes    Alcohol/week: 4.0 standard drinks    Types: 1 Glasses of wine, 3 Cans of beer per week  . Drug use: No  . Sexual activity: Never  Other Topics Concern  . Not on file  Social History Narrative  . Not on file   Social Determinants of Health   Financial Resource Strain:   . Difficulty of Paying Living Expenses:   Food Insecurity:   . Worried About Charity fundraiser in the Last Year:   . Arboriculturist in the Last  Year:   Transportation Needs:   . Film/video editor (Medical):   Marland Kitchen Lack of Transportation (Non-Medical):   Physical Activity:   . Days of Exercise per Week:   . Minutes of Exercise per Session:   Stress:   . Feeling of Stress :   Social Connections:   . Frequency of Communication with Friends and Family:   . Frequency of Social Gatherings with Friends and Family:   . Attends Religious Services:   . Active Member of Clubs or Organizations:   . Attends Archivist Meetings:   Marland Kitchen Marital Status:     Outpatient Encounter Medications as of 09/14/2019  Medication Sig  . ascorbic acid (VITAMIN C) 500 MG tablet  Take 500-1,000 mg by mouth daily.  Marland Kitchen aspirin EC 81 MG tablet Take 81 mg by mouth daily.  Marland Kitchen atorvastatin (LIPITOR) 80 MG tablet Take 1 tablet (80 mg total) by mouth daily at 6 PM.  . b complex vitamins tablet Take 1 tablet by mouth daily.  . Cholecalciferol (VITAMIN D3) 125 MCG (5000 UT) CAPS Take 5,000 Units by mouth daily.  . Cinnamon 500 MG TABS Take 500 mg by mouth 3 (three) times a week.   Marland Kitchen CITRUS BERGAMOT PO Take 1 capsule by mouth 2 (two) times a week.   . Coenzyme Q10 (CO Q 10) 100 MG CAPS Take 100 mg by mouth daily.   . Cyanocobalamin (VITAMIN B-12 PO) Take 1 tablet by mouth daily.  . diazepam (VALIUM) 5 MG tablet Take 2.5 mg by mouth at bedtime.  Marland Kitchen doxepin (SINEQUAN) 50 MG capsule TAKE 1 CAPSULE BY MOUTH AT BEDTIME AS NEEDED FOR INSOMNIA OR ANXIETY  . Fluticasone-Salmeterol (ADVAIR) 250-50 MCG/DOSE AEPB Inhale 1 puff into the lungs once a week.   . furosemide (LASIX) 20 MG tablet TAKE 1 TABLET BY MOUTH ONCE DAILY AS NEEDED FOR FLUID RETENTION  . Grape Seed OIL Take 1 capsule by mouth daily.  Nyoka Cowden Tea, Camellia sinensis, (GREEN TEA EXTRACT PO) Take 1 capsule by mouth daily.  Marland Kitchen ibuprofen (ADVIL) 200 MG tablet Take 200-400 mg by mouth every 6 (six) hours as needed for headache or mild pain.  . metoprolol succinate (TOPROL-XL) 50 MG 24 hr tablet TAKE 1 TABLET  BY MOUTH EVERY DAY TAKE WITH FOOD OR IMMEDIATELY FOLLOWING A MEAL  . nitroGLYCERIN (NITROSTAT) 0.4 MG SL tablet Place 1 tablet (0.4 mg total) under the tongue every 5 (five) minutes as needed.  . ramipril (ALTACE) 10 MG capsule TAKE 1 CAPSULE BY MOUTH EVERY DAY  . RESVERATROL 100 MG CAPS Take 100 mg by mouth daily.   . ticagrelor (BRILINTA) 90 MG TABS tablet Take 1 tablet (90 mg total) by mouth 2 (two) times daily.  . TURMERIC PO Take 1 capsule by mouth as needed.   Marland Kitchen UNABLE TO FIND Take 1 capsule by mouth See admin instructions. [place] Mediterranean Nutrient capsules: Take 1 capsule by mouth once a day   No facility-administered encounter medications on file as of 09/14/2019.    Activities of Daily Living In your present state of health, do you have any difficulty performing the following activities: 09/14/2019 06/22/2019  Hearing? N Y  Vision? N Y  Difficulty concentrating or making decisions? N N  Walking or climbing stairs? N N  Dressing or bathing? N N  Doing errands, shopping? N N  Preparing Food and eating ? N -  Comment son assist -  Using the Toilet? N -  In the past six months, have you accidently leaked urine? N -  Do you have problems with loss of bowel control? N -  Managing your Medications? N -  Managing your Finances? N -  Housekeeping or managing your Housekeeping? N -  Some recent data might be hidden    Patient Care Team: Gayland Curry, DO as PCP - General (Geriatric Medicine) Leonie Man, MD as PCP - Cardiology (Cardiology) Lorretta Harp, MD as Consulting Physician (Cardiology) Serafina Mitchell, MD as Consulting Physician (Vascular Surgery) Shon Hough, MD as Consulting Physician (Ophthalmology) Bjorn Loser, MD as Consulting Physician (Urology) Shon Hough, MD as Consulting Physician (Ophthalmology)   Assessment:   This is a routine wellness examination for Brendan.  Exercise  Activities and Dietary recommendations Current Exercise  Habits: Home exercise routine, Type of exercise: walking, Time (Minutes): 30, Frequency (Times/Week): 7, Weekly Exercise (Minutes/Week): 210, Intensity: Moderate, Exercise limited by: None identified  Goals    . Increase water intake     Starting 08/30/2016 I will start drinking 3 glasses of water a day.       Fall Risk Fall Risk  09/14/2019 07/12/2019 12/19/2018 09/12/2018 07/10/2018  Falls in the past year? 0 0 0 0 0  Number falls in past yr: 0 0 0 0 0  Injury with Fall? 0 0 0 0 0   Is the patient's home free of loose throw rugs in walkways, pet beds, electrical cords, etc?   no      Grab bars in the bathroom? yes      Handrails on the stairs?   yes      Adequate lighting?   yes  Depression Screen PHQ 2/9 Scores 09/14/2019 09/12/2018 07/10/2018 01/19/2018  PHQ - 2 Score 0 0 0 0    Cognitive Function MMSE - Mini Mental State Exam 09/09/2017 08/30/2016 08/27/2015  Orientation to time 5 5 5   Orientation to Place 5 5 5   Registration 3 3 3   Attention/ Calculation 5 5 5   Recall 2 0 3  Language- name 2 objects 2 2 2   Language- repeat 1 1 1   Language- follow 3 step command 3 3 3   Language- read & follow direction 1 1 1   Write a sentence 1 1 1   Copy design 1 0 1  Total score 29 26 30      6CIT Screen 09/14/2019 09/12/2018  What Year? 0 points 0 points  What month? 0 points 0 points  What time? 0 points 0 points  Count back from 20 0 points 0 points  Months in reverse 0 points 0 points  Repeat phrase 10 points 0 points  Total Score 10 0    Immunization History  Administered Date(s) Administered  . Fluad Quad(high Dose 65+) 01/18/2019  . Influenza Whole 02/06/2013  . Influenza, High Dose Seasonal PF 12/09/2015, 01/10/2017, 01/19/2018  . Influenza-Unspecified 01/31/2009, 01/08/2014, 12/13/2014  . Moderna SARS-COVID-2 Vaccination 07/12/2019  . Pneumococcal Conjugate-13 11/08/2013  . Pneumococcal-Unspecified 05/03/2004  . Td 05/03/2004  . Zoster 07/19/2012    Qualifies for Shingles  Vaccine? Decline   Screening Tests Health Maintenance  Topic Date Due  . TETANUS/TDAP  05/03/2014  . COVID-19 Vaccine (2 - Moderna 2-dose series) 08/09/2019  . INFLUENZA VACCINE  12/02/2019  . PNA vac Low Risk Adult  Completed   Cancer Screenings: Lung: Low Dose CT Chest recommended if Age 42-80 years, 30 pack-year currently smoking OR have quit w/in 15years. Patient does not qualify. Colorectal: Age out   Additional Screenings: Hepatitis C Screening:Low Risk    Plan:    - Declined shingrix vaccine  - Advised to get Tdap vaccine at his pharmacy. - Has completed COVID-19 Vaccine will bring card to update records.   I have personally reviewed and noted the following in the patient's chart:   . Medical and social history . Use of alcohol, tobacco or illicit drugs  . Current medications and supplements . Functional ability and status . Nutritional status . Physical activity . Advanced directives . List of other physicians . Hospitalizations, surgeries, and ER visits in previous 12 months . Vitals . Screenings to include cognitive, depression, and falls . Referrals and appointments  In addition, I have reviewed and discussed with patient certain preventive protocols,  quality metrics, and best practice recommendations. A written personalized care plan for preventive services as well as general preventive health recommendations were provided to patient.     Sandrea Hughs, NP  09/14/2019

## 2019-09-14 NOTE — Progress Notes (Signed)
    This service is provided via telemedicine  No vital signs collected/recorded due to the encounter was a telemedicine visit.   Location of patient (ex: home, work): Home.  Patient consents to a telephone visit: Yes.  Location of the provider (ex: office, home):  [facility].  Name of any referring provider: N/A  Names of all persons participating in the telemedicine service and their role in the encounter:  Patient, Samanthan Dugo, RMA, Ngetich, Dinah, NP.    Time spent on call: 8 minutes spent on the phone with Medical Assistant.   

## 2019-09-30 NOTE — Progress Notes (Signed)
Cardiology Office Note   Date:  10/02/2019   ID:  Alex [name], DOB Jun 14, 1931, MRN [id number]  PCP:  Gayland Curry, DO  Cardiologist:  Dr. Ellyn Hack  No chief complaint on file.  History of Present Illness: Alex [name] is a 86 y.o. male who presents for ongoing assessment and management of coronary artery disease with CABG in 2000, hypertension, hyperlipidemia, coronary artery stenosis status post left carotid endarterectomy in August 02, 2014.  Follow-up carotid Doppler studies revealed widely patent endarterectomy site.  Other history includes hyperlipidemia, hypertension, CHF, and obesity.  He returned to the hospital in February 2021 with a STEMI, cardiac catheterization revealed SVG to OM 2 unable to intervene with 100% stenosis in spite of extensive angioplasty aspiration thrombectomy and IV adenosine.  His ostial proximal circumflex revealed diffuse disease at 80% with a DES of the left main to left circumflex.  He had subtotal occlusion of the LAD with patent pedicle LIMA to LAD, mid RCA was 100% with patent pedicle RIMA to distal RCA.  He was last seen in the office on 07/02/2019 by Katina Dung, NP, status post hospitalization.  He was stable.  Examination of his catheter insertion site to the right groin was healing well.  He was without chest discomfort, continue to have some mild fatigue but was trying to walk more.  Blood pressure was well controlled.  He was continued on his current medication regimen and is due for repeat fasting lipids and LFTs this visit if not completed already by PCP.  Alex [name] comes today very talkative but without any complaints.  He walks at [employer] every other day.  He clocks that 5000 steps a day.  He is medically compliant.  He denies recurrent symptoms of chest discomfort or dyspnea on exertion despite his obesity.  Past Medical History:  Diagnosis Date  . Acquired deformity of nose   . Calculus of kidney   . Cellulitis and abscess of unspecified site    . Cervicalgia   . CHF (congestive heart failure) ([place])   . Dizziness and giddiness   . Encounter for long-term (current) use of other medications   . Esophagitis, unspecified   . Hyperlipidemia   . Hypertrophy of prostate with urinary obstruction and other lower urinary tract symptoms (LUTS)   . Insomnia, unspecified   . Internal hemorrhoids without mention of complication   . Left carotid artery stenosis    S/P L CEA - 2015 (Dr. Trula Slade)  . Multiple vessel coronary artery disease 2000   -> CABG X 4 (LIMA-LAD, RIMA-dRCA, SVG-OM2, SVG-RI) (Dr. Roxan Hockey)  . Obesity, unspecified   . Old myocardial infarction   . Other and unspecified hyperlipidemia   . Palpitations   . 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 Slowing of urinary stream   . Special screening for malignant neoplasm of prostate   . Unspecified essential hypertension   . 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 Unspecified vitamin D deficiency   . Urinary frequency     Past Surgical History:  Procedure Laterality Date  . CARDIAC CATHETERIZATION    . CAROTID ENDARTERECTOMY Left 06-14-14   CE  . COLONOSCOPY  2007   Dr Lajoyce Corners, normal  . CORONARY ARTERY BYPASS GRAFT  2000   Dr. Roxan Hockey - CABG x 4:  LIMA-LAD, RIMA-dRCA, SVG-OM2, SVG-RI  . CORONARY STENT INTERVENTION N/A 06/22/2019   Procedure: CORONARY STENT  INTERVENTION;  Surgeon: Leonie Man, MD;  Location: [facility];  Service: Cardiovascular;  Laterality: N/A;  . CORONARY/GRAFT ACUTE MI REVASCULARIZATION N/A 06/22/2019   Procedure: CORONARY/GRAFT ACUTE MI REVASCULARIZATION;  Surgeon: Leonie Man, MD;  Location: [facility];  Service: Cardiovascular;  Laterality: N/A;  . ENDARTERECTOMY Left 06/14/2014   Procedure: LEFT CAROTID ENDARTERECTOMY WITH VASCUGUARD PATCH ANGIOPLASTY;  Surgeon: Serafina Mitchell, MD;  Location: [facility];  Service: Vascular;  Laterality: Left;   . ESOPHAGOGASTRODUODENOSCOPY ENDOSCOPY  2007   Dr Lajoyce Corners, with biopsy  . EXCISIONAL HEMORRHOIDECTOMY  1981  . LEFT HEART CATH AND CORS/GRAFTS ANGIOGRAPHY N/A 06/22/2019   Procedure: LEFT HEART CATH AND CORS/GRAFTS ANGIOGRAPHY;  Surgeon: Leonie Man, MD;  Location: [facility] [facility];  Service: Cardiovascular;  Laterality: N/A;  . LESION FROM FOREHEAD,(R) EAR (L) ARM     DR Almira Coaster  . LITHOTRIPSY  2005   Dr Almira Coaster  . TONSILLECTOMY  1940  . trench in right side of jaw after abcesses  06/01/2016     Current Outpatient Medications  Medication Sig Dispense Refill  . ascorbic acid (VITAMIN C) 500 MG tablet Take 500-1,000 mg by mouth daily.    Marland Kitchen atorvastatin (LIPITOR) 80 MG tablet Take 1 tablet (80 mg total) by mouth daily at 6 PM. 30 tablet 6  . b complex vitamins tablet Take 1 tablet by mouth daily.    . Cholecalciferol (VITAMIN D3) 125 MCG (5000 UT) CAPS Take 5,000 Units by mouth daily.    . Cinnamon 500 MG TABS Take 500 mg by mouth 3 (three) times a week.     Marland Kitchen CITRUS BERGAMOT PO Take 1 capsule by mouth 2 (two) times a week.     . Coenzyme Q10 (CO Q 10) 100 MG CAPS Take 100 mg by mouth daily.     . Cyanocobalamin (VITAMIN B-12 PO) Take 1 tablet by mouth daily.    . diazepam (VALIUM) 5 MG tablet Take 2.5 mg by mouth at bedtime.    Marland Kitchen doxepin (SINEQUAN) 50 MG capsule TAKE 1 CAPSULE BY MOUTH AT BEDTIME AS NEEDED FOR INSOMNIA OR ANXIETY 90 capsule 0  . Fluticasone-Salmeterol (ADVAIR) 250-50 MCG/DOSE AEPB Inhale 1 puff into the lungs once a week.     . furosemide (LASIX) 20 MG tablet TAKE 1 TABLET BY MOUTH ONCE DAILY AS NEEDED FOR FLUID RETENTION 90 tablet 0  . Grape Seed OIL Take 1 capsule by mouth daily.    Nyoka Cowden Tea, Camellia sinensis, (GREEN TEA EXTRACT PO) Take 1 capsule by mouth daily.    Marland Kitchen ibuprofen (ADVIL) 200 MG tablet Take 200-400 mg by mouth every 6 (six) hours as needed for headache or mild pain.    . metoprolol succinate (TOPROL-XL) 50 MG 24 hr tablet TAKE 1 TABLET BY  MOUTH EVERY DAY TAKE WITH FOOD OR IMMEDIATELY FOLLOWING A MEAL 90 tablet 1  . nitroGLYCERIN (NITROSTAT) 0.4 MG SL tablet Place 1 tablet (0.4 mg total) under the tongue every 5 (five) minutes as needed. 25 tablet 12  . ramipril (ALTACE) 10 MG capsule TAKE 1 CAPSULE BY MOUTH EVERY DAY 90 capsule 1  . RESVERATROL 100 MG CAPS Take 100 mg by mouth daily.     . ticagrelor (BRILINTA) 90 MG TABS tablet Take 1 tablet (90 mg total) by mouth 2 (two) times daily. 60 tablet 11  . TURMERIC PO Take 1 capsule by mouth as needed.     Marland Kitchen UNABLE TO FIND Take 1 capsule  by mouth See admin instructions. Swanson Mediterranean Nutrient capsules: Take 1 capsule by mouth once a day    . aspirin EC 81 MG tablet Take 81 mg by mouth daily.     No current facility-administered medications for this visit.    Allergies:   Patient has no known allergies.    Social History:  The patient  reports that he quit smoking about 46 years ago. His smoking use included cigarettes. He has a 50.00 pack-year smoking history. He has never used smokeless tobacco. He reports current alcohol use of about 4.0 standard drinks of alcohol per week. He reports that he does not use drugs.   Family History:  The patient's family history includes Cancer in his brother, daughter, and mother.    ROS: All other systems are reviewed and negative. Unless otherwise mentioned in H&P    PHYSICAL EXAM: VS:  BP 132/68   Pulse 88   Ht 5\' 10"  (1.778 m)   Wt 219 lb (99.3 kg)   SpO2 98%   BMI 31.42 kg/m  , BMI Body mass index is 31.42 kg/m. GEN: Well nourished, well developed, in no acute distress HEENT: normal Neck: no JVD, left carotid carotid bruits, or masses Cardiac: RRR; 1/6 systolic no murmurs, rubs, or gallops,no edema  Respiratory:  Clear to auscultation bilaterally, normal work of breathing GI: soft, nontender, nondistended, + BS MS: no deformity or atrophy Skin: warm and dry, no rash Neuro:  Strength and sensation are intact Psych:  euthymic mood, full affect   EKG: Not completed this office visit  Recent Labs: 06/22/2019: ALT 17 06/25/2019: BUN 13; Creatinine, Ser 1.03; Hemoglobin 13.2; Platelets 183; Potassium 3.5; Sodium 137    Lipid Panel    Component Value Date/Time   CHOL 153 06/23/2019 0549   CHOL 193 08/25/2015 0849   TRIG 83 06/23/2019 0549   HDL 34 (L) 06/23/2019 0549   HDL 43 08/25/2015 0849   CHOLHDL 4.5 06/23/2019 0549   VLDL 17 06/23/2019 0549   LDLCALC 102 (H) 06/23/2019 0549   LDLCALC 133 (H) 01/09/2019 0824      Wt Readings from Last 3 Encounters:  10/02/19 219 lb (99.3 kg)  07/12/19 217 lb (98.4 kg)  07/02/19 218 lb (98.9 kg)      Other studies Reviewed:  Cardiac catheterization 06/22/2019:  CULPRIT LESION: SVG-OM2 graft was visualized by angiography and is normal in caliber. The graft exhibits severe diffuse disease. Origin to Mid Graft lesion is 100% stenosed.  Aspiration Thrombectomy & Balloon angioplasty was performed using a BALLOON SAPPHIRE 2.5X20. -- Unable to restore flow (PCI aborted, attention turned to LM-LCx)  Post intervention, there is a 100% residual stenosis.  NATIVE LESION: Mid LM to Ost LAD lesion is 50% stenosed with 85% stenosed side branch in Ost Cx to Mid Cx.  A drug-eluting stent was successfully placed from LM-LCx using a SYNERGY XD 2.50X38. tapered post-dilation 3.1-2.6 mm  Post intervention, there is a 0% residual stenosis from LM-LCx. 2nd Mrg lesion is now 100% stenosed (ad been 90%) .  ------  Colon Flattery LAD to Mid LAD lesion is 60% stenosed with 50% stenosed side branch in 1st Diag. Mid LAD lesion is 100% stenosed.  Ramus lesion is 80% stenosed.  LIMA-LAD graft was visualized by angiography and is normal in caliber. The graft exhibits no disease.  SVG-RI graft was visualized by angiography and is normal in caliber. The graft exhibits no disease.  ------  Mid RCA to Dist RCA lesion is 100% stenosed.  RIMA-dRCA graft was visualized by angiography and  is normal in caliber. The graft exhibits no disease.  -------------------------------------------------  There is mild left ventricular systolic dysfunction. The left ventricular ejection fraction is 45-50% by visual estimate.  There is mild (2+) mitral regurgitation.  LV end diastolic pressure is moderately elevated.  SUMMARY  Culprit lesion is occluded SVG-OM2--unable to open despite extensive angioplasty, aspiration thrombectomy and intravenous adenosine  Ostial-proximal LCx diffuse 80% -> successful DES PCI of LEFT MAIN into LCx using Synergy DES 2.5 mm 38 mm postdilated to 3.2 mm proximally and 2.6 mm distal.  Subtotal occluded LAD with patent pedicle LIMA-LAD  100% occluded mid RCA with patent pedicle RIMA-dRCA.Moderately elevated LVEDP with low normal LVEF (difficult to assess wall motion)  Dominance: Right  Intervention      Echocardiogram 06/23/2019  1. Left ventricular ejection fraction, by estimation, is 45 to 50%. The left ventricle has mildly decreased function. The left ventricle has no regional wall motion abnormalities. Left ventricular diastolic parameters are consistent with Grade I diastolic dysfunction (impaired relaxation). 2. Right ventricular systolic function is normal. The right ventricular size is normal. 3. The mitral valve is normal in structure and function. Mild mitral valve regurgitation. No evidence of mitral stenosis. 4. The aortic valve is normal in structure and function. Aortic valve regurgitation is trivial. No aortic stenosis is present. 5. The inferior vena cava is normal in size with greater than 50% respiratory variability, suggesting right atrial pressure of 3 mmHg. Left Ventricle: Left ventricular ejection fraction, by estimation, is 45 to 50%. The left ventricle has mildly decreased function. The left ventricle has no regional wall motion abnormalities. Definity contrast agent was given IV to delineate the left ventricular  endocardial borders. The left ventricular internal cavity size was normal in size. There is no left ventricular hypertrophy. Left ventricular diastolic parameters are consistent with Grade I diastolic dysfunction (impaired relaxation  Carotid artery duplex 11/27/2018 Summary:  Right Carotid: Velocities in the right ICA are consistent with a 1-39% stenosis.  Left Carotid: There is no evidence of stenosis in the left ICA.  Vertebrals: Bilateral vertebral arteries demonstrate antegrade flow.  Subclavians: Normal flow hemodynamics were seen in bilateral subclavian arteries.    CABG 09/15/1998    ASSESSMENT AND PLAN:  1.  Coronary artery disease: History of STEMI in 06/2019, SVG to OM with 100% blocked and remained so in spite of extensive angioplasty aspiration thrombectomy and IV adenosine.  He did however have a DES to the left main to left circumflex, the ostial proximal circumflex revealed diffuse disease at 80%.  Mid RCA with 100% with patent pedicle RIMA to distal RCA.  He is to continue enteric-coated aspirin, metoprolol, and statin therapy.  He is also continued on Brilinta 90 mg twice daily.  On follow-up in February 2022 May need to consider reducing to 60 mg twice daily.  He is to continue healthy lifestyle, reduction of high fat foods and increase his exercise.  He is encouraged to continue to walk as he has been doing.  2.  Hypertension: Remains on metoprolol, ramipril as directed.  3.  Hyperlipidemia: Continue atorvastatin 80 mg daily.  Goal of LDL less than 70 with known history of CAD and carotid artery disease.  4.  Obesity: The patient is encouraged to reduce calorie intake and increase his activity.  He verbalizes understanding   Current medicines are reviewed at length with the patient today.  I have spent 25 minutes dedicated to the care of this  patient on the date of this encounter to include pre-visit review of records, assessment, management and diagnostic  testing,with shared decision making.  Labs/ tests ordered today include: None Phill Myron. West Pugh, ANP, AACC   10/02/2019 2:09 PM    [facility] [place] [address] Office [phone] Fax [phone]  Notice: This dictation was prepared with Dragon dictation along with smaller phrase technology. Any transcriptional errors that result from this process are unintentional and may not be corrected upon review.

## 2019-10-02 ENCOUNTER — Ambulatory Visit (INDEPENDENT_AMBULATORY_CARE_PROVIDER_SITE_OTHER): Payer: Medicare HMO | Admitting: Adult Health

## 2019-10-02 ENCOUNTER — Encounter: Payer: Self-pay | Admitting: Adult Health

## 2019-10-02 ENCOUNTER — Other Ambulatory Visit: Payer: Self-pay

## 2019-10-02 VITALS — BP 132/68 | HR 88 | Ht 70.0 in | Wt 219.0 lb

## 2019-10-02 DIAGNOSIS — I10 Essential (primary) hypertension: Secondary | ICD-10-CM | POA: Diagnosis not present

## 2019-10-02 DIAGNOSIS — E7800 Pure hypercholesterolemia, unspecified: Secondary | ICD-10-CM | POA: Diagnosis not present

## 2019-10-02 DIAGNOSIS — I6522 Occlusion and stenosis of left carotid artery: Secondary | ICD-10-CM

## 2019-10-02 DIAGNOSIS — I2510 Atherosclerotic heart disease of native coronary artery without angina pectoris: Secondary | ICD-10-CM | POA: Diagnosis not present

## 2019-10-02 NOTE — Patient Instructions (Signed)

## 2019-10-03 ENCOUNTER — Other Ambulatory Visit: Payer: Self-pay

## 2019-10-03 MED ORDER — FUROSEMIDE 20 MG PO TABS: ORAL_TABLET | ORAL | 0 refills | Status: DC

## 2019-10-30 ENCOUNTER — Other Ambulatory Visit: Payer: Self-pay

## 2019-10-30 DIAGNOSIS — Z23 Encounter for immunization: Secondary | ICD-10-CM

## 2019-10-30 MED ORDER — TETANUS-DIPHTH-ACELL PERTUSSIS 5-2.5-18.5 LF-MCG/0.5 IM SUSP: 0.5000 mL | Freq: Once | INTRAMUSCULAR | 0 refills | Status: AC

## 2019-10-30 NOTE — Progress Notes (Signed)
This encounter was created in error - please disregard.

## 2019-10-30 NOTE — Addendum Note (Signed)
Addended by: Bonney Leitz T on: 10/30/2019 02:08 PM   Modules accepted: Level of Service, SmartSet

## 2019-11-16 ENCOUNTER — Other Ambulatory Visit: Payer: Self-pay | Admitting: Internal Medicine

## 2020-01-04 ENCOUNTER — Telehealth: Payer: Self-pay | Admitting: Cardiology

## 2020-01-04 NOTE — Telephone Encounter (Signed)
Pt c/o medication issue: 1. Name of Medication: Brilliant 90 mg 2. How are you currently taking this medication (dosage and times per day)? Was on it once a day patient did not know he was to take 2 times a day. 3. Are you having a reaction (difficulty breathing--STAT)?  Don not sleep at all and the patient have difficulty breathing after the second one at night. 4. What is your medication issue? Patient did not know he was to be taking it 2 times a day.

## 2020-01-04 NOTE — Telephone Encounter (Signed)
I spoke with patient.  He has only been taking Brilinta once daily.  He does not remember when he decreased dose and is not sure if he ever took it twice daily.  He realized last week he should be taking twice daily so he started taking in the morning and evening.  On the days he takes 2 doses of Brilinta he has shortness of breath and difficulty sleeping.  He took Brilinta last night and had shortness of breath all night.  He does not have shortness of breath when taking Brilinta once daily.  No chest pain.  Able to do normal activities during the day.   I have advised patient he should take Brilinta twice daily for now and I would send message to Dr Ellyn Hack to see if any changes could be made

## 2020-01-06 ENCOUNTER — Other Ambulatory Visit: Payer: Self-pay | Admitting: Internal Medicine

## 2020-01-06 DIAGNOSIS — I10 Essential (primary) hypertension: Secondary | ICD-10-CM

## 2020-01-06 NOTE — Telephone Encounter (Signed)
Lets just have him convert to Plavix -->  Would do Plavix 300 mg p.o. x1 day then 75 mg daily afterwards.  Rx clopidogrel 75 mg daily (first day 300 mg-4 times); disp #90 tablets;  refills  Glenetta Hew, MD

## 2020-01-08 MED ORDER — CLOPIDOGREL BISULFATE 75 MG PO TABS: 75.0000 mg | ORAL_TABLET | Freq: Every day | ORAL | 6 refills | Status: DC

## 2020-01-08 NOTE — Telephone Encounter (Signed)
Spoke to patient . Patient is aware to stop taking Brilinta 90 mg  then start taking Clopidogrel/Plavis 75 mg. The first day take 4 tablets ( 300 mg ) then take  75 mg ( 1 tablet starting the second day daily  Patient verbalized understanding requested a 30 day supply with  Refills   e sent prescription  .

## 2020-01-14 ENCOUNTER — Other Ambulatory Visit: Payer: Self-pay | Admitting: Internal Medicine

## 2020-01-14 NOTE — Telephone Encounter (Signed)
Pharmacy requested rx Epic LR: 11/16/2019 Pended Rx and sent to [facility] for approval. (Dr. Mariea Clonts out of office)

## 2020-01-16 ENCOUNTER — Other Ambulatory Visit: Payer: Self-pay | Admitting: Internal Medicine

## 2020-01-16 DIAGNOSIS — I10 Essential (primary) hypertension: Secondary | ICD-10-CM

## 2020-01-17 ENCOUNTER — Other Ambulatory Visit: Payer: Self-pay

## 2020-01-17 ENCOUNTER — Other Ambulatory Visit: Payer: Medicare HMO

## 2020-01-17 DIAGNOSIS — I509 Heart failure, unspecified: Secondary | ICD-10-CM

## 2020-01-17 DIAGNOSIS — R7303 Prediabetes: Secondary | ICD-10-CM | POA: Diagnosis not present

## 2020-01-17 DIAGNOSIS — Z0000 Encounter for general adult medical examination without abnormal findings: Secondary | ICD-10-CM

## 2020-01-17 DIAGNOSIS — E785 Hyperlipidemia, unspecified: Secondary | ICD-10-CM | POA: Diagnosis not present

## 2020-01-18 LAB — COMPLETE METABOLIC PANEL WITH GFR
AG Ratio: 1.8 (calc) (ref 1.0–2.5)
ALT: 12 U/L (ref 9–46)
AST: 13 U/L (ref 10–35)
Albumin: 4.4 g/dL (ref 3.6–5.1)
Alkaline phosphatase (APISO): 42 U/L (ref 35–144)
BUN/Creatinine Ratio: 14 (calc) (ref 6–22)
BUN: 16 mg/dL (ref 7–25)
CO2: 28 mmol/L (ref 20–32)
Calcium: 9.2 mg/dL (ref 8.6–10.3)
Chloride: 104 mmol/L (ref 98–110)
Creat: 1.14 mg/dL — ABNORMAL HIGH (ref 0.70–1.11)
GFR, Est African American: 67 mL/min/{1.73_m2} (ref >=60)
GFR, Est Non African American: 58 mL/min/{1.73_m2} — ABNORMAL LOW (ref >=60)
Globulin: 2.5 g/dL (calc) (ref 1.9–3.7)
Glucose, Bld: 93 mg/dL (ref 65–99)
Potassium: 3.9 mmol/L (ref 3.5–5.3)
Sodium: 140 mmol/L (ref 135–146)
Total Bilirubin: 0.5 mg/dL (ref 0.2–1.2)
Total Protein: 6.9 g/dL (ref 6.1–8.1)

## 2020-01-18 LAB — CBC WITH DIFFERENTIAL/PLATELET
Absolute Monocytes: 481 cells/uL (ref 200–950)
Basophils Absolute: 52 cells/uL (ref 0–200)
Basophils Relative: 0.8 %
Eosinophils Absolute: 319 cells/uL (ref 15–500)
Eosinophils Relative: 4.9 %
HCT: 45.6 % (ref 38.5–50.0)
Hemoglobin: 14.6 g/dL (ref 13.2–17.1)
Lymphs Abs: 2080 cells/uL (ref 850–3900)
MCH: 32.4 pg (ref 27.0–33.0)
MCHC: 32 g/dL (ref 32.0–36.0)
MCV: 101.1 fL — ABNORMAL HIGH (ref 80.0–100.0)
MPV: 11.2 fL (ref 7.5–12.5)
Monocytes Relative: 7.4 %
Neutro Abs: 3569 cells/uL (ref 1500–7800)
Neutrophils Relative %: 54.9 %
Platelets: 243 10*3/uL (ref 140–400)
RBC: 4.51 10*6/uL (ref 4.20–5.80)
RDW: 12.8 % (ref 11.0–15.0)
Total Lymphocyte: 32 %
WBC: 6.5 10*3/uL (ref 3.8–10.8)

## 2020-01-18 LAB — HEMOGLOBIN A1C
Hgb A1c MFr Bld: 5.7 % of total Hgb — ABNORMAL HIGH (ref <5.7)
Mean Plasma Glucose: 117 (calc)
eAG (mmol/L): 6.5 (calc)

## 2020-01-18 LAB — LIPID PANEL
Cholesterol: 225 mg/dL — ABNORMAL HIGH (ref <200)
HDL: 44 mg/dL (ref >=40)
LDL Cholesterol (Calc): 162 mg/dL (calc) — ABNORMAL HIGH
Non-HDL Cholesterol (Calc): 181 mg/dL (calc) — ABNORMAL HIGH (ref <130)
Total CHOL/HDL Ratio: 5.1 (calc) — ABNORMAL HIGH (ref <5.0)
Triglycerides: 87 mg/dL (ref <150)

## 2020-01-21 ENCOUNTER — Encounter: Payer: Self-pay | Admitting: Internal Medicine

## 2020-01-21 ENCOUNTER — Ambulatory Visit (INDEPENDENT_AMBULATORY_CARE_PROVIDER_SITE_OTHER): Payer: Medicare HMO | Admitting: Internal Medicine

## 2020-01-21 ENCOUNTER — Other Ambulatory Visit: Payer: Self-pay

## 2020-01-21 VITALS — BP 160/90 | HR 80 | Temp 98.1°F | Resp 20 | Ht 68.5 in | Wt 224.8 lb

## 2020-01-21 DIAGNOSIS — I25810 Atherosclerosis of coronary artery bypass graft(s) without angina pectoris: Secondary | ICD-10-CM | POA: Diagnosis not present

## 2020-01-21 DIAGNOSIS — R7303 Prediabetes: Secondary | ICD-10-CM

## 2020-01-21 DIAGNOSIS — Z0000 Encounter for general adult medical examination without abnormal findings: Secondary | ICD-10-CM | POA: Diagnosis not present

## 2020-01-21 DIAGNOSIS — I10 Essential (primary) hypertension: Secondary | ICD-10-CM

## 2020-01-21 DIAGNOSIS — Z23 Encounter for immunization: Secondary | ICD-10-CM

## 2020-01-21 DIAGNOSIS — I509 Heart failure, unspecified: Secondary | ICD-10-CM | POA: Diagnosis not present

## 2020-01-21 DIAGNOSIS — E782 Mixed hyperlipidemia: Secondary | ICD-10-CM | POA: Diagnosis not present

## 2020-01-21 MED ORDER — ATORVASTATIN CALCIUM 80 MG PO TABS: 80.0000 mg | ORAL_TABLET | Freq: Every day | ORAL | 6 refills | Status: DC

## 2020-01-21 MED ORDER — ASPIRIN EC 81 MG PO TBEC: 81.0000 mg | DELAYED_RELEASE_TABLET | Freq: Every day | ORAL | 5 refills | Status: DC

## 2020-01-21 NOTE — Progress Notes (Signed)
Provider:  Rexene Edison. Mariea Clonts, D.O., C.M.D. Location:   [facility]  Place of Service:   clinic  Previous PCP: Gayland Curry, DO Patient Care Team: Gayland Curry, DO as PCP - General (Geriatric Medicine) Leonie Man, MD as PCP - Cardiology (Cardiology) Lorretta Harp, MD as Consulting Physician (Cardiology) Serafina Mitchell, MD as Consulting Physician (Vascular Surgery) Shon Hough, MD as Consulting Physician (Ophthalmology) Bjorn Loser, MD as Consulting Physician (Urology) Shon Hough, MD as Consulting Physician (Ophthalmology)  Extended Emergency Contact Information Primary Emergency Contact: Griego,Jack Address: [address]          [place], [place] [address] Johnnette Litter of [place] Phone: [phone] Relation: Son Secondary Emergency Contact: Windell Moment Address: [address]          [place] [address] Johnnette Litter of [place] Phone: [phone] Relation: Son  Goals of Care: Advanced Directive information Advanced Directives 01/21/2020  Does Patient Have a Medical Advance Directive? Yes  Type of Advance Directive -  Does patient want to make changes to medical advance directive? -  Copy of [place] in Chart? -  Would patient like information on creating a medical advance directive? -   Chief Complaint  Patient presents with  . Annual Exam    Annual Exam, Failed Clock Test    HPI: Patient is a 86 y.o. male seen today for an annual physical exam.    BP elevated here.  Says if he could lose his gut, that would help.  He took his medicine 3 hrs ago.  It will be something like it is here, but it may come down to 140/70stg.  He says he is going to work on losing weight.  He wants to lose weight and be 190lbs.  He eats a lot of frozen foods.  He thinks he is eating too much sugar.   He admits to eating a lot of cheese dip.  He's been buying no sugar sodas now.  He's still eating ice cream--low sugar.    His  kidney function declined.  Takes ibuprofen only every once in a a while.  Uses turmeric some for aches occasionally.  Discussed using tylenol.    He is not taking his asa or his statin.  Reminded him that he had a heart attack in feb and needs to be on these medications to prevent recurrence.  He is on plavix.    MMSE has declined to 28/30 today.  Has gradually trended down over the past few years.  He failed his clock drawing.  He's having notable challenges finding words and keeping up with his meds and his friend, Robert's, who he has helped out for years.  His second moderna covid was 4/14 at [facility]. Flu shot given today.  Past Medical History:  Diagnosis Date  . Acquired deformity of nose   . Calculus of kidney   . Cellulitis and abscess of unspecified site   . Cervicalgia   . CHF (congestive heart failure) ([place])   . Dizziness and giddiness   . Encounter for long-term (current) use of other medications   . Esophagitis, unspecified   . Hyperlipidemia   . Hypertrophy of prostate with urinary obstruction and other lower urinary tract symptoms (LUTS)   . Insomnia, unspecified   . Internal hemorrhoids without mention of complication   . Left carotid artery stenosis    S/P L CEA - 2015 (Dr. Trula Slade)  . Multiple vessel coronary artery disease 2000   ->  CABG X 4 (LIMA-LAD, RIMA-dRCA, SVG-OM2, SVG-RI) (Dr. Roxan Hockey)  . Obesity, unspecified   . Old myocardial infarction   . Other and unspecified hyperlipidemia   . Palpitations   . 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 Slowing of urinary stream   . Special screening for malignant neoplasm of prostate   . Unspecified essential hypertension   . 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 Unspecified vitamin D deficiency   . Urinary frequency    Past Surgical History:  Procedure Laterality Date  . CARDIAC CATHETERIZATION    . CAROTID ENDARTERECTOMY  Left 06-14-14   CE  . COLONOSCOPY  2007   Dr Lajoyce Corners, normal  . CORONARY ARTERY BYPASS GRAFT  2000   Dr. Roxan Hockey - CABG x 4:  LIMA-LAD, RIMA-dRCA, SVG-OM2, SVG-RI  . CORONARY STENT INTERVENTION N/A 06/22/2019   Procedure: CORONARY STENT INTERVENTION;  Surgeon: Leonie Man, MD;  Location: [facility];  Service: Cardiovascular;  Laterality: N/A;  . CORONARY/GRAFT ACUTE MI REVASCULARIZATION N/A 06/22/2019   Procedure: CORONARY/GRAFT ACUTE MI REVASCULARIZATION;  Surgeon: Leonie Man, MD;  Location: [facility];  Service: Cardiovascular;  Laterality: N/A;  . ENDARTERECTOMY Left 06/14/2014   Procedure: LEFT CAROTID ENDARTERECTOMY WITH VASCUGUARD PATCH ANGIOPLASTY;  Surgeon: Serafina Mitchell, MD;  Location: [facility];  Service: Vascular;  Laterality: Left;  . ESOPHAGOGASTRODUODENOSCOPY ENDOSCOPY  2007   Dr Lajoyce Corners, with biopsy  . EXCISIONAL HEMORRHOIDECTOMY  1981  . LEFT HEART CATH AND CORS/GRAFTS ANGIOGRAPHY N/A 06/22/2019   Procedure: LEFT HEART CATH AND CORS/GRAFTS ANGIOGRAPHY;  Surgeon: Leonie Man, MD;  Location: [facility];  Service: Cardiovascular;  Laterality: N/A;  . LESION FROM FOREHEAD,(R) EAR (L) ARM     DR Almira Coaster  . LITHOTRIPSY  2005   Dr Almira Coaster  . TONSILLECTOMY  1940  . trench in right side of jaw after abcesses  06/01/2016    reports that he quit smoking about 46 years ago. His smoking use included cigarettes. He has a 50.00 pack-year smoking history. He has never used smokeless tobacco. He reports current alcohol use of about 4.0 standard drinks of alcohol per week. He reports that he does not use drugs.  Functional Status Survey:    Family History  Problem Relation Age of Onset  . Cancer Mother        LIVER  . Cancer Brother        PROSTATE  . Cancer Daughter     Health Maintenance  Topic Date Due  . TETANUS/TDAP  05/03/2014  . COVID-19 Vaccine (2 - Moderna 2-dose series) 08/09/2019  . INFLUENZA VACCINE  Completed  . PNA vac Low  Risk Adult  Completed    No Known Allergies  Outpatient Encounter Medications as of 01/21/2020  Medication Sig  . ascorbic acid (VITAMIN C) 500 MG tablet Take 500-1,000 mg by mouth daily.  Marland Kitchen b complex vitamins tablet Take 1 tablet by mouth daily.  . Cholecalciferol (VITAMIN D3) 125 MCG (5000 UT) CAPS Take 5,000 Units by mouth daily.  . Cinnamon 500 MG TABS Take 500 mg by mouth 3 (three) times a week.   Marland Kitchen CITRUS BERGAMOT PO Take 1 capsule by mouth 2 (two) times a week.   . clopidogrel (PLAVIX) 75 MG tablet Take 1 tablet (75 mg total) by mouth daily. ( the initial first day take 4 tablets only)  . Coenzyme Q10 (CO  Q 10) 100 MG CAPS Take 100 mg by mouth daily.   . Cyanocobalamin (VITAMIN B-12 PO) Take 1 tablet by mouth daily.  . diazepam (VALIUM) 5 MG tablet TAKE 1/2 TABLET BY MOUTH TWICE DAILY  . doxepin (SINEQUAN) 50 MG capsule TAKE 1 CAPSULE BY MOUTH AT BEDTIME AS NEEDED FOR INSOMNIA OR ANXIETY  . Fluticasone-Salmeterol (ADVAIR) 250-50 MCG/DOSE AEPB Inhale 1 puff into the lungs once a week.   . Grape Seed OIL Take 1 capsule by mouth daily.  Nyoka Cowden Tea, Camellia sinensis, (GREEN TEA EXTRACT PO) Take 1 capsule by mouth daily.  Marland Kitchen ibuprofen (ADVIL) 200 MG tablet Take 200-400 mg by mouth every 6 (six) hours as needed for headache or mild pain.  . metoprolol succinate (TOPROL-XL) 50 MG 24 hr tablet TAKE 1 TABLET BY MOUTH EVERY DAY WITH FOOD OR IMMEDIATELY FOLLOWING A MEAL.  . nitroGLYCERIN (NITROSTAT) 0.4 MG SL tablet Place 1 tablet (0.4 mg total) under the tongue every 5 (five) minutes as needed.  . ramipril (ALTACE) 10 MG capsule TAKE 1 CAPSULE BY MOUTH EVERY DAY  . RESVERATROL 100 MG CAPS Take 100 mg by mouth daily.   . TURMERIC PO Take 1 capsule by mouth as needed.   Marland Kitchen UNABLE TO FIND Take 1 capsule by mouth See admin instructions. Swanson Mediterranean Nutrient capsules: Take 1 capsule by mouth once a day  . aspirin EC 81 MG tablet Take 1 tablet (81 mg total) by mouth daily.  Marland Kitchen atorvastatin  (LIPITOR) 80 MG tablet Take 1 tablet (80 mg total) by mouth daily at 6 PM.  . furosemide (LASIX) 20 MG tablet TAKE 1 TABLET BY MOUTH ONCE DAILY AS NEEDED FOR FLUID RETENTION (Patient not taking: Reported on 01/21/2020)  . [DISCONTINUED] aspirin EC 81 MG tablet Take 81 mg by mouth daily. (Patient not taking: Reported on 01/21/2020)  . [DISCONTINUED] atorvastatin (LIPITOR) 80 MG tablet Take 1 tablet (80 mg total) by mouth daily at 6 PM. (Patient not taking: Reported on 01/21/2020)   No facility-administered encounter medications on file as of 01/21/2020.    Review of Systems  Constitutional: Positive for malaise/fatigue. Negative for chills and fever.  HENT: Positive for hearing loss. Negative for congestion.   Eyes: Negative for blurred vision (reports seeing well but no eye exam for years--dr. Kathrin Penner had found cataracts and he starting taking supplements to keep them from progressing and thinks he does not need follow up now).  Respiratory: Negative for cough and shortness of breath.   Cardiovascular: Negative for chest pain, palpitations, orthopnea, leg swelling and PND.  Gastrointestinal: Negative for abdominal pain, blood in stool, constipation, diarrhea and melena.  Genitourinary: Positive for frequency. Negative for dysuria and urgency.  Musculoskeletal: Positive for joint pain. Negative for falls.  Skin: Negative for itching and rash.  Neurological: Negative for dizziness and loss of consciousness.  Endo/Heme/Allergies: Positive for environmental allergies.  Psychiatric/Behavioral: Positive for memory loss. Negative for depression. The patient has insomnia. The patient is not nervous/anxious.     Vitals:   01/21/20 1247  BP: (!) 160/90  Pulse: 80  Resp: 20  Temp: 98.1 F (36.7 C)  TempSrc: Oral  SpO2: 96%  Weight: 224 lb 12.8 oz (102 kg)  Height: 5' 8.5" (1.74 m)   Body mass index is 33.68 kg/m. Physical Exam Vitals reviewed.  Constitutional:      General: He is not in  acute distress.    Appearance: Normal appearance. He is obese. He is not toxic-appearing.  HENT:  Head: Normocephalic and atraumatic.     Right Ear: External ear normal.     Left Ear: External ear normal.     Ears:     Comments: Some soft, moist-appearing cerumen in bilateral canals; HOH    Nose: Nose normal.     Mouth/Throat:     Pharynx: Oropharynx is clear.  Eyes:     Extraocular Movements: Extraocular movements intact.     Conjunctiva/sclera: Conjunctivae normal.     Pupils: Pupils are equal, round, and reactive to light.     Comments: Very old glasses  Cardiovascular:     Rate and Rhythm: Normal rate and regular rhythm.     Heart sounds: No murmur heard.   Pulmonary:     Effort: Pulmonary effort is normal.     Breath sounds: Normal breath sounds. No wheezing, rhonchi or rales.  Abdominal:     General: Bowel sounds are normal. There is no distension.     Palpations: Abdomen is soft. There is no mass.     Tenderness: There is no abdominal tenderness. There is no guarding or rebound.     Comments: Ventral hernia vs diastasis recti when sits up; no pain  Musculoskeletal:        General: Normal range of motion.     Cervical back: Neck supple.     Right lower leg: No edema.     Left lower leg: No edema.  Lymphadenopathy:     Cervical: No cervical adenopathy.  Skin:    General: Skin is warm and dry.  Neurological:     General: No focal deficit present.     Mental Status: He is alert and oriented to person, place, and time.     Cranial Nerves: No cranial nerve deficit.     Sensory: No sensory deficit.     Motor: No weakness.     Coordination: Coordination normal.     Gait: Gait normal.     Comments: 1+ DTRs patellar  Psychiatric:        Mood and Affect: Mood normal.        Behavior: Behavior normal.     Labs reviewed: Basic Metabolic Panel: Recent Labs    06/24/19 0124 06/25/19 0123 01/17/20 0855  NA 138 137 140  K 3.6 3.5 3.9  CL 105 105 104  CO2 23 21*  28  GLUCOSE 109* 108* 93  BUN 11 13 16   CREATININE 0.95 1.03 1.14*  CALCIUM 8.3* 8.5* 9.2   Liver Function Tests: Recent Labs    06/22/19 0729 01/17/20 0855  AST 21 13  ALT 17 12  ALKPHOS 35*  --   BILITOT 0.8 0.5  PROT 6.6 6.9  ALBUMIN 3.5  --    No results for input(s): LIPASE, AMYLASE in the last 8760 hours. No results for input(s): AMMONIA in the last 8760 hours. CBC: Recent Labs    06/22/19 0729 06/22/19 0808 06/24/19 0124 06/25/19 0123 01/17/20 0855  WBC 9.6   < > 8.9 9.3 6.5  NEUTROABS 7.1  --   --   --  3,569  HGB 14.0   < > 13.0 13.2 14.6  HCT 43.5   < > 38.3* 38.8* 45.6  MCV 101.6*   < > 96.2 96.3 101.1*  PLT 218   < > 165 183 243   < > = values in this interval not displayed.   Cardiac Enzymes: No results for input(s): CKTOTAL, CKMB, CKMBINDEX, TROPONINI in the last 8760 hours. BNP: Invalid input(s):  POCBNP Lab Results  Component Value Date   HGBA1C 5.7 (H) 01/17/2020   Lab Results  Component Value Date   TSH 1.390 10/06/2012    Assessment/Plan 1. Annual physical exam -performed today, flu shot given - advised on diet, exercise -step goal set -f/u with ophtho recommended -to use tylenol or voltaren gel for OA pain - Lipid panel; Future - Hemoglobin A1c; Future - Basic metabolic panel; Future  2. Coronary artery disease involving coronary bypass graft of native heart without angina pectoris - emphasized importance of taking meds as directed from cardiology to prevent another heart attack - atorvastatin (LIPITOR) 80 MG tablet; Take 1 tablet (80 mg total) by mouth daily at 6 PM.  Dispense: 30 tablet; Refill: 6 - aspirin EC 81 MG tablet; Take 1 tablet (81 mg total) by mouth daily.  Dispense: 30 tablet; Refill: 5  3. Prediabetes - educated on diet and exercise--try to walk 5000 steps 4 days per week b/w now and 3 month f/u - Hemoglobin A1c; Future  4. Mixed hyperlipidemia - educated on diet and exercise with goals set -restart the  lipitor--he stopped it b/c of fear of side effects not actually having side effects -again emphasized how crucial it is that he take this with the heart attack he had just a few months ago -for some reason, he'd also stopped his asa when plavix was added but cardiology had not taken that off of his med list - atorvastatin (LIPITOR) 80 MG tablet; Take 1 tablet (80 mg total) by mouth daily at 6 PM.  Dispense: 30 tablet; Refill: 6 - Lipid panel; Future  5. Essential hypertension -bp remains elevated here but he is always anxious coming in here -improves after meds but had not fully "kicked in" yet -cont same regimen, if still high at next visit, I will make changes  6. Other congestive heart failure (HCC) -no active symptoms--no dyspnea or edema but does get fatigued after exertion -f/u with cardiology as planned  7. Need for influenza vaccination - Flu Vaccine QUAD High Dose(Fluad) given, covid updated  Labs/tests ordered:   Orders Placed This Encounter  Procedures  . Flu Vaccine QUAD High Dose(Fluad)  . Lipid panel    Standing Status:   Future    Standing Expiration Date:   07/20/2020    Order Specific Question:   Has the patient fasted?    Answer:   Yes  . Hemoglobin A1c    Standing Status:   Future    Standing Expiration Date:   07/20/2020    Order Specific Question:   Release to patient    Answer:   Immediate  . Basic metabolic panel    Standing Status:   Future    Standing Expiration Date:   07/20/2020    Order Specific Question:   Has the patient fasted?    Answer:   Yes   Next appt:  05/01/2020 f/u on bp, cholesterol and sugar with fasting labs before  Paschal Blanton L. Jatia Musa, D.O. [facility] [address] [place], [place] [address] Cell Phone (Mon-Fri 8am-5pm):  [phone] On Call:  [phone] & follow prompts after 5pm & weekends Office Phone:  [phone] Office Fax:  [phone]

## 2020-01-21 NOTE — Patient Instructions (Addendum)
Decrease the cheese in your diet--it's high in fat and cholesterol.  I recommend you eat less fat, less sugar, and move more.  Try to get up to 5000 steps per day walking 4 days per week.   Please restart your lipitor and aspirin.    I recommend you follow-up with Dr. Kathrin Penner about your eyes at least every 2 years.  If you take anything for pain, I recommend acetaminophen (tylenol).  You could also use voltaren gel on your knees.  It's over the counter now.

## 2020-01-21 NOTE — Progress Notes (Signed)
Bad cholesterol is very high and has gone up considerably from a year ago.  I recommend that he avoid high fat foods, fried foods.  I would like for him to take a medication for his cholesterol. Sugar average is a bit improved actually. Kidney function has declined considerably--he needs to be drinking plenty of water and avoiding medications like NSAIDs (aleve/naproxen, motrin/ibuprofen, goody's powder, BC powder) Liver tests are normal Blood counts are normal

## 2020-02-18 ENCOUNTER — Other Ambulatory Visit: Payer: Self-pay | Admitting: Internal Medicine

## 2020-02-25 ENCOUNTER — Other Ambulatory Visit: Payer: Self-pay | Admitting: Family

## 2020-03-25 ENCOUNTER — Telehealth: Payer: Self-pay | Admitting: *Deleted

## 2020-03-25 NOTE — Telephone Encounter (Signed)
Left message to call back - in regards to upcoming appointment nov 29 , 2021

## 2020-03-31 ENCOUNTER — Encounter: Payer: Self-pay | Admitting: Cardiology

## 2020-03-31 ENCOUNTER — Ambulatory Visit (INDEPENDENT_AMBULATORY_CARE_PROVIDER_SITE_OTHER): Payer: Medicare HMO | Admitting: Cardiology

## 2020-03-31 ENCOUNTER — Other Ambulatory Visit: Payer: Self-pay

## 2020-03-31 VITALS — BP 136/73 | HR 76 | Temp 97.3°F | Ht 70.0 in | Wt 225.8 lb

## 2020-03-31 DIAGNOSIS — I10 Essential (primary) hypertension: Secondary | ICD-10-CM

## 2020-03-31 DIAGNOSIS — I2129 ST elevation (STEMI) myocardial infarction involving other sites: Secondary | ICD-10-CM

## 2020-03-31 DIAGNOSIS — E785 Hyperlipidemia, unspecified: Secondary | ICD-10-CM

## 2020-03-31 DIAGNOSIS — I25119 Atherosclerotic heart disease of native coronary artery with unspecified angina pectoris: Secondary | ICD-10-CM | POA: Diagnosis not present

## 2020-03-31 DIAGNOSIS — R600 Localized edema: Secondary | ICD-10-CM

## 2020-03-31 DIAGNOSIS — Z951 Presence of aortocoronary bypass graft: Secondary | ICD-10-CM

## 2020-03-31 MED ORDER — ROSUVASTATIN CALCIUM 40 MG PO TABS: 40.0000 mg | ORAL_TABLET | ORAL | 3 refills | Status: DC

## 2020-03-31 NOTE — Patient Instructions (Signed)
Medication Instructions:  Stop  Atorvastatin   Change to rosuvastatin  40 mg  3 days week    *If you need a refill on your cardiac medications before your next appointment, please call your pharmacy*   Lab Work: lipid  cmp in Feb 2022 If you have labs (blood work) drawn today and your tests are completely normal, you will receive your results only by: Marland Kitchen MyChart Message (if you have MyChart) OR . A paper copy in the mail If you have any lab test that is abnormal or we need to change your treatment, we will call you to review the results.   Testing/Procedures: Not changes    Follow-Up: At [facility], you and your health needs are our priority.  As part of our continuing mission to provide you with exceptional heart care, we have created designated Provider Care Teams.  These Care Teams include your primary Cardiologist (physician) and Advanced Practice Providers (APPs -  Physician Assistants and Nurse Practitioners) who all work together to provide you with the care you need, when you need it.  We recommend signing up for the patient portal called "MyChart".  Sign up information is provided on this After Visit Summary.  MyChart is used to connect with patients for Virtual Visits (Telemedicine).  Patients are able to view lab/test results, encounter notes, upcoming appointments, etc.  Non-urgent messages can be sent to your provider as well.   To learn more about what you can do with MyChart, go to [email or web address].    Your next appointment:   4 month(s)  The format for your next appointment:   In Person  Provider:   Glenetta Hew, MD   Other Instructions

## 2020-03-31 NOTE — Progress Notes (Signed)
Primary Care Provider: Gayland Curry, DO Cardiologist: Glenetta Hew, MD Electrophysiologist: None  Clinic Note: Chief Complaint  Patient presents with  . Follow-up    6 months  . Coronary Artery Disease    History of CABG now recent STEMI.  No further angina.    HPI:    Alex [name] is a 86 y.o. male with a PMH notable for CAD with CABG in 2000, HTN, HLD, carotid disease (L CEA April 2016) with recent STEMI in February 2021 (occluded SVG-OM2) who presents today for 75-month follow-up.   Problem List Items Addressed This Visit    Coronary artery disease involving native coronary artery of native heart with angina pectoris (HCC) (Chronic)   ST elevation myocardial infarction (STEMI) of true posterior wall, initial episode of care ([place]) - Primary (Chronic)   Hx of CABG x4 (LIMA-LAD, RIMA-dRCA, SVG-OM2, SVG-RI) -- OCCLUDED SVG-OM (Chronic)   Hyperlipidemia with target LDL less than 70 (Chronic)   Essential hypertension (Chronic)     Alex [name] was last seen on October 02, 2019 by Bunnie Domino, NP.  This was a second follow-up after his STEMI from February 2021.  He had no major complaints.  He indicates that his exercise is walking at [employer] every other day.  He tries a lot of least 5000 steps a day.  Medically compliant with no major complaints.  No chest discomfort or dyspnea with rest or exertion despite obesity.  No major issues.  Recent Hospitalizations: None since his MI in February  Reviewed  CV studies:    The following studies were reviewed today: (if available, images/films reviewed: From Epic Chart or Care Everywhere) . Cardiac Cath - PCI (06/22/2019): CULPRIT LESION = 100% SVG-OM2 - unsuccessful PTCA wit aspiration thrombectomy; Severe native CAD: mLM-Ost LAD-50% w/ Ost LCx 85% (DES PCI OM-OstLCx Synergy DES 2.5 x 38 (3.1-2.6 mm) - Treated b/c 100% Occluded SVG-OM2 as Culprit lesion. OM2 100%. Ost RI 80%, Ost-mid LAD 50% - 100% CTO after D1. 100% mRCA; Patent  PedLIMA-dLAD, SVG-RI, PedRIMA-dRCA. Moderately Elevated LVEDP. EF ~45-50%.     Echocardiogram 06/23/2019: (Definity) mildly reduced LV function-EF 45 to 50%.  GR 1 DD.  No obvious R WMA.  Essentially normal valves.  Mild MR.     Interval History:   Alex [name] returns here today for essentially his third post hospital clinic visit since his MI.  He hurt right knee a few weeks ago doing yard work, and now he has discomfort in his right knee and calf.  This is kept him from doing his routine amount of walking, but still is doing a decent amount of walking probably 08-4998 steps a day-usually walks at [employer].  He has a little baseline dyspnea but no change from pre-MI.  Now he is a little limited because of his knee and that these are little dyspnea.  No further anginal type chest pain or pressure. He has had nighttime cramping mostly in the right calf.  He is also unilateral weakness the right hand.  He is lost his dexterity.  He has adjusted his diet pretty dramatically since his MI and is making attempt to lose weight.  However since his knee injury, he is put back on some.  CV Review of Symptoms (Summary): positive for - Mild exertional dyspnea, nighttime cramps. negative for - chest pain, edema, irregular heartbeat, orthopnea, palpitations, paroxysmal nocturnal dyspnea, rapid heart rate, shortness of breath or Lightheadedness or dizziness, syncope/near syncope or TIA/amaurosis fugax.  Claudication.  The patient does not have symptoms concerning for COVID-19 infection (fever, chills, cough, or new shortness of breath).   REVIEWED OF SYSTEMS   Review of Systems  Constitutional: Negative for malaise/fatigue and weight loss.  HENT: Negative for congestion and nosebleeds.   Respiratory: Negative for shortness of breath.   Gastrointestinal: Negative for blood in stool and melena.  Genitourinary: Negative for hematuria.  Musculoskeletal: Positive for joint pain (Knee pain) and myalgias  (Right calf). Negative for falls.  Neurological: Positive for tingling (Sometimes the right leg) and focal weakness (Right hand is little weak.  Decreased dexterity.). Negative for dizziness.  Psychiatric/Behavioral: Negative for memory loss. The patient is not nervous/anxious and does not have insomnia.    I have reviewed and (if needed) personally updated the patient's problem list, medications, allergies, past medical and surgical history, social and family history.   PAST MEDICAL HISTORY   Past Medical History:  Diagnosis Date  . Acquired deformity of nose   . Calculus of kidney   . Cellulitis and abscess of unspecified site   . Cervicalgia   . Chronic heart failure with preserved ejection fraction (HFpEF) (HCC)    EF ~45-50%  . Dizziness and giddiness   . Encounter for long-term (current) use of other medications   . Esophagitis, unspecified   . Hyperlipidemia   . Hypertrophy of prostate with urinary obstruction and other lower urinary tract symptoms (LUTS)   . Insomnia, unspecified   . Internal hemorrhoids without mention of complication   . Left carotid artery stenosis    S/P L CEA - 2015 (Dr. Trula Slade)  . Multiple vessel coronary artery disease 2000   ~50% LM-ostLAD with 85% ost-mid LCx, 80% ost RI, 100% CTO RCA -> CABG X 4 (LIMA-LAD, RIMA-dRCA, SVG-OM2, SVG-RI) (Dr. Roxan Hockey) => 06/2019: STEMI : 100% SVG-OM2 (unable to open), 100% OM2; DES PCI LM-mLCx.   . Obesity, unspecified   . Old myocardial infarction   . Other and unspecified hyperlipidemia   . Palpitations   . 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 Slowing of urinary stream   . Special screening for malignant neoplasm of prostate   . Unspecified essential hypertension   . 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 Unspecified vitamin D deficiency   . Urinary frequency     PAST SURGICAL HISTORY   Past Surgical History:   Procedure Laterality Date  . CAROTID ENDARTERECTOMY Left 06-14-14   CE  . COLONOSCOPY  2007   Dr Lajoyce Corners, normal  . CORONARY ARTERY BYPASS GRAFT  2000   Dr. Roxan Hockey - CABG x 4:  LIMA-LAD, RIMA-dRCA, SVG-OM2, SVG-RI  . CORONARY STENT INTERVENTION N/A 06/22/2019   Procedure: CORONARY STENT INTERVENTION;  Surgeon: Leonie Man, MD;  Location: [facility]; unsuccessful attempt at revascularization of SVG-OM2 => Synergy DES PCI from LM-mid LCx (Synergy DES 2.5 mm x 38 mm--> 3.1-2.6 mm); unfortunately OM2 was occluded.  This revascularized OM1, OM 3 and OM 4.  . CORONARY/GRAFT ACUTE MI REVASCULARIZATION N/A 06/22/2019   Procedure: CORONARY/GRAFT ACUTE MI REVASCULARIZATION;  Surgeon: Leonie Man, MD;  Location: [facility];; unsuccessful PTCA/aspiration thrombectomy of SVG-OM1 as culprit lesion for STEMI  . ENDARTERECTOMY Left 06/14/2014   Procedure: LEFT CAROTID ENDARTERECTOMY WITH VASCUGUARD PATCH ANGIOPLASTY;  Surgeon: Serafina Mitchell, MD;  Location: [facility];  Service: Vascular;  Laterality: Left;  . ESOPHAGOGASTRODUODENOSCOPY ENDOSCOPY  2007  Dr Lajoyce Corners, with biopsy  . EXCISIONAL HEMORRHOIDECTOMY  1981  . LEFT HEART CATH AND CORONARY ANGIOGRAPHY  2000   MV CAD --> CABG x 4  . LEFT HEART CATH AND CORS/GRAFTS ANGIOGRAPHY N/A 06/22/2019   Procedure: LEFT HEART CATH AND CORS/GRAFTS ANGIOGRAPHY;  Surgeon: Leonie Man, MD;  CULPRIT LESION = 100% SVG-OM2; Severe native CAD: mLM-Ost LAD-50% w/ Ost LCx 85% (DES PCI OM-OstLCx)- Treated b/c 100% TO SVG-OM2 as Culprit lesion - unable to open. OM2 100%. Ost RI 80%, Ost-mid LAD 50% - 100% mLAD after D1; ost RI 80%, mid RCA 100%; Patent SVG-RI, pedLIMA-dLAD, pedRIMA-dRCA; EF 45-50%  . LESION FROM FOREHEAD,(R) EAR (L) ARM     DR Almira Coaster  . LITHOTRIPSY  2005   Dr Almira Coaster  . TONSILLECTOMY  1940  . TRANSTHORACIC ECHOCARDIOGRAM  06/23/2019   Inferolateral STEMI (occluded SVG-OM2): (Definity) mildly reduced LV function-EF 45 to 50%.  GR  1 DD.  No obvious R WMA.  Essentially normal valves.  Mild MR.  . trench in right side of jaw after abcesses  06/01/2016   Cath-PCI 06/22/2019  Culprit lesion is occluded SVG-OM2--unable to open despite extensive angioplasty, aspiration thrombectomy and intravenous adenosine  Ostial-proximal LCx diffuse 80% -> successful DES PCI of LEFT MAIN into LCx using Synergy DES 2.5 mm 38 mm postdilated to 3.2 mm proximally and 2.6 mm distal.  Subtotal occluded LAD with patent pedicle LIMA-LAD  100% occluded mid RCA with patent pedicled RIMA-dRCA.  Moderately elevated LVEDP with low normal LVEF (difficult to assess wall motion)    Immunization History  Administered Date(s) Administered  . Fluad Quad(high Dose 65+) 01/18/2019, 01/21/2020  . Influenza Whole 02/06/2013  . Influenza, High Dose Seasonal PF 12/09/2015, 01/10/2017, 01/19/2018  . Influenza-Unspecified 01/31/2009, 01/08/2014, 12/13/2014  . Moderna Sars-Covid-2 Vaccination 07/12/2019, 08/15/2019  . Pneumococcal Conjugate-13 11/08/2013  . Pneumococcal-Unspecified 05/03/2004  . Td 05/03/2004  . Zoster 07/19/2012    MEDICATIONS/ALLERGIES   Current Meds  Medication Sig  . ascorbic acid (VITAMIN C) 500 MG tablet Take 500-1,000 mg by mouth daily.  Marland Kitchen aspirin EC 81 MG tablet Take 1 tablet (81 mg total) by mouth daily.  Marland Kitchen b complex vitamins tablet Take 1 tablet by mouth daily.  . Cholecalciferol (VITAMIN D3) 125 MCG (5000 UT) CAPS Take 5,000 Units by mouth daily.  . Cinnamon 500 MG TABS Take 500 mg by mouth 3 (three) times a week.   Marland Kitchen CITRUS BERGAMOT PO Take 1 capsule by mouth 2 (two) times a week.   . clopidogrel (PLAVIX) 75 MG tablet Take 1 tablet (75 mg total) by mouth daily. ( the initial first day take 4 tablets only)  . Coenzyme Q10 (CO Q 10) 100 MG CAPS Take 100 mg by mouth daily.   . Cyanocobalamin (VITAMIN B-12 PO) Take 1 tablet by mouth daily.  . diazepam (VALIUM) 5 MG tablet TAKE 1/2 TABLET BY MOUTH TWICE DAILY  . doxepin  (SINEQUAN) 50 MG capsule TAKE 1 CAPSULE BY MOUTH AT BEDTIME AS NEEDED FOR INSOMNIA OR ANXIETY  . Fluticasone-Salmeterol (ADVAIR) 250-50 MCG/DOSE AEPB Inhale 1 puff into the lungs once a week.   . furosemide (LASIX) 20 MG tablet TAKE 1 TABLET BY MOUTH ONCE DAILY AS NEEDED FOR FLUID RETENTION  . Grape Seed OIL Take 1 capsule by mouth daily.  Nyoka Cowden Tea, Camellia sinensis, (GREEN TEA EXTRACT PO) Take 1 capsule by mouth daily.  Marland Kitchen ibuprofen (ADVIL) 200 MG tablet Take 200-400 mg by mouth every 6 (six) hours as needed  for headache or mild pain.  . metoprolol succinate (TOPROL-XL) 50 MG 24 hr tablet TAKE 1 TABLET BY MOUTH EVERY DAY WITH FOOD OR IMMEDIATELY FOLLOWING A MEAL.  . nitroGLYCERIN (NITROSTAT) 0.4 MG SL tablet Place 1 tablet (0.4 mg total) under the tongue every 5 (five) minutes as needed.  . ramipril (ALTACE) 10 MG capsule TAKE 1 CAPSULE BY MOUTH EVERY DAY  . RESVERATROL 100 MG CAPS Take 100 mg by mouth daily.   . TURMERIC PO Take 1 capsule by mouth as needed.   Marland Kitchen UNABLE TO FIND Take 1 capsule by mouth See admin instructions. Swanson Mediterranean Nutrient capsules: Take 1 capsule by mouth once a day  . [DISCONTINUED] atorvastatin (LIPITOR) 80 MG tablet Take 1 tablet (80 mg total) by mouth daily at 6 PM.    No Known Allergies  SOCIAL HISTORY/FAMILY HISTORY   Reviewed in Epic:  Pertinent findings: Still walks 08-4998 steps a day.  Usually tries to walk inside it grocery store such as [employer].  OBJCTIVE -PE, EKG, labs   Wt Readings from Last 3 Encounters:  03/31/20 225 lb 12.8 oz (102.4 kg)  01/21/20 224 lb 12.8 oz (102 kg)  10/02/19 219 lb (99.3 kg)    Physical Exam: BP 136/73   Pulse 76   Temp (!) 97.3 F (36.3 C)   Ht 5\' 10"  (1.778 m)   Wt 225 lb 12.8 oz (102.4 kg)   SpO2 97%   BMI 32.40 kg/m  Physical Exam Constitutional:      General: He is not in acute distress.    Appearance: Normal appearance. He is obese. He is not ill-appearing, toxic-appearing or  diaphoretic.     Comments: Well-groomed.  Healthy-appearing.  HENT:     Head: Normocephalic and atraumatic.  Neck:     Vascular: No carotid bruit, hepatojugular reflux or JVD.  Cardiovascular:     Rate and Rhythm: Normal rate and regular rhythm.  No extrasystoles are present.    Chest Wall: PMI is not displaced.     Pulses: Normal pulses.     Heart sounds: Normal heart sounds. No murmur heard. No friction rub. No gallop.   Pulmonary:     Effort: Pulmonary effort is normal. No respiratory distress.     Breath sounds: Normal breath sounds.  Chest:     Chest wall: No tenderness.  Musculoskeletal:        General: No swelling. Normal range of motion.     Cervical back: Normal range of motion and neck supple.  Skin:    General: Skin is warm and dry.  Neurological:     General: No focal deficit present.     Mental Status: He is alert and oriented to person, place, and time.  Psychiatric:        Mood and Affect: Mood normal.        Behavior: Behavior normal.        Thought Content: Thought content normal.        Judgment: Judgment normal.     Adult ECG Report  Rate: 76 ;  Rhythm: normal sinus rhythm and Inferior MI, age undetermined.;  Otherwise normal axis and respirations.  Narrative Interpretation: Stable EKG.  Recent Labs: Reviewed Lab Results  Component Value Date   CHOL 225 (H) 01/17/2020   HDL 44 01/17/2020   LDLCALC 162 (H) 01/17/2020   TRIG 87 01/17/2020   CHOLHDL 5.1 (H) 01/17/2020   Lab Results  Component Value Date   CREATININE 1.14 (H) 01/17/2020  BUN 16 01/17/2020   NA 140 01/17/2020   K 3.9 01/17/2020   CL 104 01/17/2020   CO2 28 01/17/2020   Lab Results  Component Value Date   TSH 1.390 10/06/2012    ASSESSMENT/PLAN    Problem List Items Addressed This Visit    Coronary artery disease involving native coronary artery of native heart with angina pectoris ([place]) (Chronic)    Pretty significant native disease with occluded right and LAD as well as  OM 2.  Grafts are patent with exception of the graft to the OM 2 but now left main into the circumflex is revascularized with stent.  Other grafts were patent.  Plan:   Continue aspirin and Plavix at current dose for extensive stent.  We will plan for DAPT x1 year.  After 1 year we will continue Plavix along for second year.  As of March 2022, okay to hold Plavix 5 days preop for surgery procedure.  He is on modest dose Toprol along with metoprolol has been walking ACE inhibitor.  Also low-dose Lasix.  Converting from atorvastatin to rosuvastatin-needs more aggressive lipid control.      Relevant Medications   rosuvastatin (CRESTOR) 40 MG tablet   Other Relevant Orders   EKG 12-Lead (Completed)   Lipid panel   Comprehensive metabolic panel   ST elevation myocardial infarction (STEMI) of true posterior wall, initial episode of care ([place]) - Primary (Chronic)    Culprit lesion was occluded graft to OM 2.  Now the graft and the native OM 2 are both occluded, but the native circumflex is revascularized percutaneously.  Interestingly, EF is slightly reduced but no obvious wall motion abnormality.  No recurrent anginal symptoms.  No heart failure symptoms.      Relevant Medications   rosuvastatin (CRESTOR) 40 MG tablet   Other Relevant Orders   EKG 12-Lead (Completed)   Lipid panel   Comprehensive metabolic panel   Hx of CABG x4 (LIMA-LAD, RIMA-dRCA, SVG-OM2, SVG-RI) -- OCCLUDED SVG-OM (Chronic)    3 of 4 grafts patent.  Unable to revascularize the OM graft.  Native vessel stented.      Relevant Orders   EKG 12-Lead (Completed)   Hyperlipidemia with target LDL less than 70 (Chronic)    At the end of the visit, it became clear that he is actually not taking his atorvastatin regularly.  Most time he forgets to take it and has not really been taking it with any regularity. He has had a cramps and some right hand weakness.  At this point I think I would like to switch from atorvastatin to  rosuvastatin His lipids are not at goal -> 20 more aggressive treatment.  Plan: Stop atorvastatin and change to rosuvastatin 40 mg 3 days a week.  We will need to reassess lipids in February timeframe in order to determine ongoing care.  We will refer to [facility] lipid clinic pending results of upcoming labs.      Relevant Medications   rosuvastatin (CRESTOR) 40 MG tablet   Other Relevant Orders   Lipid panel   Comprehensive metabolic panel   Essential hypertension (Chronic)   Relevant Medications   rosuvastatin (CRESTOR) 40 MG tablet   Bilateral lower extremity edema (Chronic)    Bilateral lower extremity edema without any PND orthopnea.  He is on low-dose furosemide.  We discuss importance of foot elevation and possibly compression stockings.          COVID-19 Education: The signs and symptoms of COVID-19 were  discussed with the patient and how to seek care for testing (follow up with PCP or arrange E-visit).   The importance of social distancing and COVID-19 vaccination was discussed today. 1 min The patient is practicing social distancing & Masking.   I spent a total of 28minutes with the patient spent in direct patient consultation.  Additional time spent with chart review  / charting (studies, outside notes, etc): 16  -I have not seen the patient since the evening of his cardiac catheterization. Total Time: 41 min   Current medicines are reviewed at length with the patient today.  (+/- concerns) N/A  This visit occurred during the SARS-CoV-2 public health emergency.  Safety protocols were in place, including screening questions prior to the visit, additional usage of staff PPE, and extensive cleaning of exam room while observing appropriate contact time as indicated for disinfecting solutions.  Notice: This dictation was prepared with Dragon dictation along with smaller phrase technology. Any transcriptional errors that result from this process are unintentional and may not be  corrected upon review.  Patient Instructions / Medication Changes & Studies & Tests Ordered   Patient Instructions  Medication Instructions:  Stop  Atorvastatin   Change to rosuvastatin  40 mg  3 days week    *If you need a refill on your cardiac medications before your next appointment, please call your pharmacy*   Lab Work: lipid  cmp in Feb 2022 If you have labs (blood work) drawn today and your tests are completely normal, you will receive your results only by: Marland Kitchen MyChart Message (if you have MyChart) OR . A paper copy in the mail If you have any lab test that is abnormal or we need to change your treatment, we will call you to review the results.   Testing/Procedures: Not changes    Follow-Up: At [facility], you and your health needs are our priority.  As part of our continuing mission to provide you with exceptional heart care, we have created designated Provider Care Teams.  These Care Teams include your primary Cardiologist (physician) and Advanced Practice Providers (APPs -  Physician Assistants and Nurse Practitioners) who all work together to provide you with the care you need, when you need it.  We recommend signing up for the patient portal called "MyChart".  Sign up information is provided on this After Visit Summary.  MyChart is used to connect with patients for Virtual Visits (Telemedicine).  Patients are able to view lab/test results, encounter notes, upcoming appointments, etc.  Non-urgent messages can be sent to your provider as well.   To learn more about what you can do with MyChart, go to [email or web address].    Your next appointment:   4 month(s)  The format for your next appointment:   In Person  Provider:   Glenetta Hew, MD   Other Instructions     Studies Ordered:   Orders Placed This Encounter  Procedures  . Lipid panel  . Comprehensive metabolic panel  . EKG 12-Lead     Glenetta Hew, M.D., M.S. Interventional Cardiologist    Pager # [phone] Phone # [phone] [address] [place], [place] [address]   Thank you for choosing Heartcare at [facility]!!

## 2020-04-11 ENCOUNTER — Encounter: Payer: Self-pay | Admitting: Cardiology

## 2020-04-11 NOTE — Assessment & Plan Note (Addendum)
Pretty significant native disease with occluded right and LAD as well as OM 2.  Grafts are patent with exception of the graft to the OM 2 but now left main into the circumflex is revascularized with stent.  Other grafts were patent.  Plan:   Continue aspirin and Plavix at current dose for extensive stent.  We will plan for DAPT x1 year.  After 1 year we will continue Plavix along for second year.  As of March 2022, okay to hold Plavix 5 days preop for surgery procedure.  He is on modest dose Toprol along with metoprolol has been walking ACE inhibitor.  Also low-dose Lasix.  Converting from atorvastatin to rosuvastatin-needs more aggressive lipid control.

## 2020-04-11 NOTE — Assessment & Plan Note (Signed)
Culprit lesion was occluded graft to OM 2.  Now the graft and the native OM 2 are both occluded, but the native circumflex is revascularized percutaneously.  Interestingly, EF is slightly reduced but no obvious wall motion abnormality.  No recurrent anginal symptoms.  No heart failure symptoms.

## 2020-04-11 NOTE — Assessment & Plan Note (Signed)
Bilateral lower extremity edema without any PND orthopnea.  He is on low-dose furosemide.  We discuss importance of foot elevation and possibly compression stockings.

## 2020-04-11 NOTE — Assessment & Plan Note (Addendum)
At the end of the visit, it became clear that he is actually not taking his atorvastatin regularly.  Most time he forgets to take it and has not really been taking it with any regularity. He has had a cramps and some right hand weakness.  At this point I think I would like to switch from atorvastatin to rosuvastatin His lipids are not at goal -> 20 more aggressive treatment.  Plan: Stop atorvastatin and change to rosuvastatin 40 mg 3 days a week.  We will need to reassess lipids in February timeframe in order to determine ongoing care.  We will refer to [facility] lipid clinic pending results of upcoming labs.

## 2020-04-11 NOTE — Assessment & Plan Note (Signed)
3 of 4 grafts patent.  Unable to revascularize the OM graft.  Native vessel stented.

## 2020-04-24 ENCOUNTER — Ambulatory Visit: Payer: Medicare HMO | Admitting: Internal Medicine

## 2020-04-28 ENCOUNTER — Other Ambulatory Visit: Payer: Medicare HMO

## 2020-05-01 ENCOUNTER — Ambulatory Visit: Payer: Medicare HMO | Admitting: Internal Medicine

## 2020-05-06 ENCOUNTER — Other Ambulatory Visit: Payer: Self-pay

## 2020-05-06 DIAGNOSIS — I6523 Occlusion and stenosis of bilateral carotid arteries: Secondary | ICD-10-CM

## 2020-05-12 ENCOUNTER — Other Ambulatory Visit: Payer: Self-pay | Admitting: Internal Medicine

## 2020-05-12 NOTE — Telephone Encounter (Signed)
rx sent to pharmacy by e-script  

## 2020-05-21 ENCOUNTER — Ambulatory Visit (INDEPENDENT_AMBULATORY_CARE_PROVIDER_SITE_OTHER): Payer: Medicare HMO | Admitting: Physician Assistant

## 2020-05-21 ENCOUNTER — Other Ambulatory Visit: Payer: Self-pay

## 2020-05-21 ENCOUNTER — Ambulatory Visit (HOSPITAL_COMMUNITY)
Admission: RE | Admit: 2020-05-21 | Discharge: 2020-05-21 | Disposition: A | Discharge status: Home / Self Care | Payer: Medicare HMO | Source: Ambulatory Visit | Attending: Physician Assistant | Admitting: Physician Assistant

## 2020-05-21 VITALS — BP 143/82 | HR 70 | Temp 98.1°F | Resp 20 | Ht 70.0 in | Wt 218.6 lb

## 2020-05-21 DIAGNOSIS — I6523 Occlusion and stenosis of bilateral carotid arteries: Secondary | ICD-10-CM | POA: Insufficient documentation

## 2020-05-21 NOTE — Progress Notes (Signed)
HISTORY AND PHYSICAL     CC:  follow up. Requesting Provider:  Gayland Curry, DO  HPI: This is a 87 y.o. male here for follow up for carotid artery stenosis.  Pt is s/p left CEA for asymptomatic carotid artery stenosis on 06/14/2014 by Dr. Trula Slade.    Pt was last seen 11/27/2018 and at that time he was doing well without any stroke or TIA sx.  He did not have any claudication.   Pt returns today for follow up.   Pt denies any amaurosis fugax, speech difficulties, weakness, numbness, paralysis or clumsiness or new facial droop.  He states he has a little bit of numbness in the tip of the right thumb and attributes this to computer/mouse use.  He states that since his surgery for an abscess, he has had numbness around his jaw and neck.  He does have some leg swelling and states it is better after elevation as well as lasix.  He denies any claudication or non healing wounds.   He has hx of CABG at age 55 in 26.  He did have a STEMI in February 2021.  He did have cardiac catheterization on 06/22/2019, but despite extensive angioplasty, he was unable to open. A drug eluding stent was placed in the native artery.  He was started on high dose statin.  He was seen by Dr. Ellyn Hack in December.  He did have a slightly reduced EF, but no obvious wall motion abnormalities.  He did not have any recurrent anginal sx or heart failure sx.  He was having some cramps and his lipitor was changed to crestor every M/W/F.  He was continued on his plavix and asa.   Pt retired from [employer] and also worked at [employer].    He states that he takes apple cider vinegar and has not had a cold in 5 years.  States it has also helped with his reflux and was able to stop his Prilosec.   The pt is on a statin for cholesterol management.  The pt is on a daily aspirin.   Other AC:  Plavix The pt is on BB & ACEI for hypertension.   The pt is not diabetic.   Tobacco hx:  former  Pt does not have family  hx of AAA.  Past Medical History:  Diagnosis Date  . Acquired deformity of nose   . Calculus of kidney   . Cellulitis and abscess of unspecified site   . Cervicalgia   . Chronic heart failure with preserved ejection fraction (HFpEF) (HCC)    EF ~45-50%  . Dizziness and giddiness   . Encounter for long-term (current) use of other medications   . Esophagitis, unspecified   . Hyperlipidemia   . Hypertrophy of prostate with urinary obstruction and other lower urinary tract symptoms (LUTS)   . Insomnia, unspecified   . Internal hemorrhoids without mention of complication   . Left carotid artery stenosis    S/P L CEA - 2015 (Dr. Trula Slade)  . Multiple vessel coronary artery disease 2000   ~50% LM-ostLAD with 85% ost-mid LCx, 80% ost RI, 100% CTO RCA -> CABG X 4 (LIMA-LAD, RIMA-dRCA, SVG-OM2, SVG-RI) (Dr. Roxan Hockey) => 06/2019: STEMI : 100% SVG-OM2 (unable to open), 100% OM2; DES PCI LM-mLCx.   . Obesity, unspecified   . Old myocardial infarction   . Other and unspecified hyperlipidemia   . Palpitations   . 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 Slowing of urinary stream   . Special  screening for malignant neoplasm of prostate   . Unspecified essential hypertension   . 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 Unspecified vitamin D deficiency   . Urinary frequency     Past Surgical History:  Procedure Laterality Date  . CAROTID ENDARTERECTOMY Left 06-14-14   CE  . COLONOSCOPY  2007   Dr Lajoyce Corners, normal  . CORONARY ARTERY BYPASS GRAFT  2000   Dr. Roxan Hockey - CABG x 4:  LIMA-LAD, RIMA-dRCA, SVG-OM2, SVG-RI  . CORONARY STENT INTERVENTION N/A 06/22/2019   Procedure: CORONARY STENT INTERVENTION;  Surgeon: Leonie Man, MD;  Location: [facility]; unsuccessful attempt at revascularization of SVG-OM2 => Synergy DES PCI from LM-mid LCx (Synergy DES 2.5 mm x 38 mm--> 3.1-2.6 mm); unfortunately OM2 was  occluded.  This revascularized OM1, OM 3 and OM 4.  . CORONARY/GRAFT ACUTE MI REVASCULARIZATION N/A 06/22/2019   Procedure: CORONARY/GRAFT ACUTE MI REVASCULARIZATION;  Surgeon: Leonie Man, MD;  Location: [facility];; unsuccessful PTCA/aspiration thrombectomy of SVG-OM1 as culprit lesion for STEMI  . ENDARTERECTOMY Left 06/14/2014   Procedure: LEFT CAROTID ENDARTERECTOMY WITH VASCUGUARD PATCH ANGIOPLASTY;  Surgeon: Serafina Mitchell, MD;  Location: [facility];  Service: Vascular;  Laterality: Left;  . ESOPHAGOGASTRODUODENOSCOPY ENDOSCOPY  2007   Dr Lajoyce Corners, with biopsy  . EXCISIONAL HEMORRHOIDECTOMY  1981  . LEFT HEART CATH AND CORONARY ANGIOGRAPHY  2000   MV CAD --> CABG x 4  . LEFT HEART CATH AND CORS/GRAFTS ANGIOGRAPHY N/A 06/22/2019   Procedure: LEFT HEART CATH AND CORS/GRAFTS ANGIOGRAPHY;  Surgeon: Leonie Man, MD;  CULPRIT LESION = 100% SVG-OM2; Severe native CAD: mLM-Ost LAD-50% w/ Ost LCx 85% (DES PCI OM-OstLCx)- Treated b/c 100% TO SVG-OM2 as Culprit lesion - unable to open. OM2 100%. Ost RI 80%, Ost-mid LAD 50% - 100% mLAD after D1; ost RI 80%, mid RCA 100%; Patent SVG-RI, pedLIMA-dLAD, pedRIMA-dRCA; EF 45-50%  . LESION FROM FOREHEAD,(R) EAR (L) ARM     DR Almira Coaster  . LITHOTRIPSY  2005   Dr Almira Coaster  . TONSILLECTOMY  1940  . TRANSTHORACIC ECHOCARDIOGRAM  06/23/2019   Inferolateral STEMI (occluded SVG-OM2): (Definity) mildly reduced LV function-EF 45 to 50%.  GR 1 DD.  No obvious R WMA.  Essentially normal valves.  Mild MR.  . trench in right side of jaw after abcesses  06/01/2016    No Known Allergies  Current Outpatient Medications  Medication Sig Dispense Refill  . ascorbic acid (VITAMIN C) 500 MG tablet Take 500-1,000 mg by mouth daily.    Marland Kitchen aspirin EC 81 MG tablet Take 1 tablet (81 mg total) by mouth daily. 30 tablet 5  . b complex vitamins tablet Take 1 tablet by mouth daily.    . Cholecalciferol (VITAMIN D3) 125 MCG (5000 UT) CAPS Take 5,000 Units by mouth  daily.    . Cinnamon 500 MG TABS Take 500 mg by mouth 3 (three) times a week.     Marland Kitchen CITRUS BERGAMOT PO Take 1 capsule by mouth 2 (two) times a week.     . clopidogrel (PLAVIX) 75 MG tablet Take 1 tablet (75 mg total) by mouth daily. ( the initial first day take 4 tablets only) 33 tablet 6  . Coenzyme Q10 (CO Q 10) 100 MG CAPS Take 100 mg by mouth daily.     . Cyanocobalamin (VITAMIN B-12 PO) Take 1 tablet by  mouth daily.    . diazepam (VALIUM) 5 MG tablet TAKE 1/2 TABLET BY MOUTH TWICE DAILY 30 tablet 5  . doxepin (SINEQUAN) 50 MG capsule TAKE 1 CAPSULE BY MOUTH AT BEDTIME AS NEEDED FOR INSOMNIA OR ANXIETY 90 capsule 0  . Fluticasone-Salmeterol (ADVAIR) 250-50 MCG/DOSE AEPB Inhale 1 puff into the lungs once a week.     . furosemide (LASIX) 20 MG tablet TAKE 1 TABLET BY MOUTH ONCE DAILY AS NEEDED FOR FLUID RETENTION 90 tablet 0  . Grape Seed OIL Take 1 capsule by mouth daily.    Nyoka Cowden Tea, Camellia sinensis, (GREEN TEA EXTRACT PO) Take 1 capsule by mouth daily.    Marland Kitchen ibuprofen (ADVIL) 200 MG tablet Take 200-400 mg by mouth every 6 (six) hours as needed for headache or mild pain.    . metoprolol succinate (TOPROL-XL) 50 MG 24 hr tablet TAKE 1 TABLET BY MOUTH EVERY DAY WITH FOOD OR IMMEDIATELY FOLLOWING A MEAL. 90 tablet 1  . nitroGLYCERIN (NITROSTAT) 0.4 MG SL tablet Place 1 tablet (0.4 mg total) under the tongue every 5 (five) minutes as needed. 25 tablet 12  . ramipril (ALTACE) 10 MG capsule TAKE 1 CAPSULE BY MOUTH EVERY DAY 90 capsule 1  . RESVERATROL 100 MG CAPS Take 100 mg by mouth daily.     . rosuvastatin (CRESTOR) 40 MG tablet Take 1 tablet (40 mg total) by mouth every Monday, Wednesday, and Friday. 45 tablet 3  . TURMERIC PO Take 1 capsule by mouth as needed.     Marland Kitchen UNABLE TO FIND Take 1 capsule by mouth See admin instructions. Swanson Mediterranean Nutrient capsules: Take 1 capsule by mouth once a day     No current facility-administered medications for this visit.    Family History   Problem Relation Age of Onset  . Cancer Mother        LIVER  . Cancer Brother        PROSTATE  . Cancer Daughter     Social History   Socioeconomic History  . Marital status: Widowed    Spouse name: Not on file  . Number of children: Not on file  . Years of education: Not on file  . Highest education level: Not on file  Occupational History  . Not on file  Tobacco Use  . Smoking status: Former Smoker    Packs/day: 2.00    Years: 25.00    Pack years: 50.00    Types: Cigarettes    Quit date: 05/03/1973    Years since quitting: 47.0  . Smokeless tobacco: Never Used  Vaping Use  . Vaping Use: Never used  Substance and Sexual Activity  . Alcohol use: Yes    Alcohol/week: 4.0 standard drinks    Types: 1 Glasses of wine, 3 Cans of beer per week  . Drug use: No  . Sexual activity: Never  Other Topics Concern  . Not on file  Social History Narrative  . Not on file   Social Determinants of Health   Financial Resource Strain: Not on file  Food Insecurity: Not on file  Transportation Needs: Not on file  Physical Activity: Not on file  Stress: Not on file  Social Connections: Not on file  Intimate Partner Violence: Not on file     REVIEW OF SYSTEMS:   [X]  denotes positive finding, [ ]  denotes negative finding Cardiac  Comments:  Chest pain or chest pressure:    Shortness of breath upon exertion:    Short  of breath when lying flat:    Irregular heart rhythm:        Vascular    Pain in calf, thigh, or hip brought on by ambulation:    Pain in feet at night that wakes you up from your sleep:     Blood clot in your veins:    Leg swelling:         Pulmonary    Oxygen at home:    Productive cough:     Wheezing:         Neurologic    Sudden weakness in arms or legs:     Sudden numbness in arms or legs:     Sudden onset of difficulty speaking or slurred speech:    Temporary loss of vision in one eye:     Problems with dizziness:         Gastrointestinal     Blood in stool:     Vomited blood:         Genitourinary    Burning when urinating:     Blood in urine:        Psychiatric    Major depression:         Hematologic    Bleeding problems:    Problems with blood clotting too easily:        Skin    Rashes or ulcers:        Constitutional    Fever or chills:      PHYSICAL EXAMINATION:  Today's Vitals   05/21/20 1402 05/21/20 1404  BP: (!) 146/79 (!) 143/82  Pulse: 70   Resp: 20   Temp: 98.1 F (36.7 C)   TempSrc: Temporal   SpO2: 96%   Weight: 218 lb 9.6 oz (99.2 kg)   Height: 5\' 10"  (1.778 m)    Body mass index is 31.37 kg/m.   General:  WDWN in NAD; vital signs documented above Gait: Not observed HENT: WNL, normocephalic Pulmonary: normal non-labored breathing Cardiac: regular HR, without carotid bruits Abdomen: soft, NT, no masses; aortic pulse is not palpable Skin: without rashes Vascular Exam/Pulses: 2+ bilateral radial pulses Extremities: without ischemic changes, without Gangrene , without cellulitis; without open wounds; mild BLE edema Musculoskeletal: no muscle wasting or atrophy  Neurologic: A&O X 3; moving all extremities equally. Psychiatric:  The pt has Normal affect.   Non-Invasive Vascular Imaging:   Carotid Duplex on 05/31/2020: Right:  1-39% ICA stenosis Left:  1-39% ICA stenosis Vertebrals: Bilateral vertebral arteries demonstrate antegrade flow.  Subclavians: Normal flow hemodynamics were seen in bilateral subclavian   arteries.  Previous Carotid duplex on 11/27/2018: Right: 1-39% ICA stenosis Left:   Normal ICA    ASSESSMENT/PLAN:: 87 y.o. male here for follow up carotid artery stenosis.  -duplex today reveals 1-39% bilateral ICA stenosis.  He remains asymptomatic. -discussed s/s of stroke with pt and he understands should he develop any of these sx, he will go to the nearest ER. -pt will f/u in 18 months with carotid duplex -pt will call sooner should they have any  issues. -continue statin/asa   Leontine Locket, [facility] [phone]  Clinic MD:  Scot Dock

## 2020-05-26 ENCOUNTER — Other Ambulatory Visit: Payer: Medicare HMO

## 2020-05-26 ENCOUNTER — Other Ambulatory Visit: Payer: Self-pay

## 2020-05-26 DIAGNOSIS — R7303 Prediabetes: Secondary | ICD-10-CM | POA: Diagnosis not present

## 2020-05-26 DIAGNOSIS — Z0000 Encounter for general adult medical examination without abnormal findings: Secondary | ICD-10-CM | POA: Diagnosis not present

## 2020-05-26 DIAGNOSIS — E782 Mixed hyperlipidemia: Secondary | ICD-10-CM | POA: Diagnosis not present

## 2020-05-27 LAB — BASIC METABOLIC PANEL
BUN/Creatinine Ratio: 14 (calc) (ref 6–22)
BUN: 17 mg/dL (ref 7–25)
CO2: 28 mmol/L (ref 20–32)
Calcium: 8.8 mg/dL (ref 8.6–10.3)
Chloride: 105 mmol/L (ref 98–110)
Creat: 1.21 mg/dL — ABNORMAL HIGH (ref 0.70–1.11)
Glucose, Bld: 89 mg/dL (ref 65–99)
Potassium: 4.1 mmol/L (ref 3.5–5.3)
Sodium: 139 mmol/L (ref 135–146)

## 2020-05-27 LAB — LIPID PANEL
Cholesterol: 120 mg/dL (ref <200)
HDL: 38 mg/dL — ABNORMAL LOW (ref >=40)
LDL Cholesterol (Calc): 66 mg/dL (calc)
Non-HDL Cholesterol (Calc): 82 mg/dL (calc) (ref <130)
Total CHOL/HDL Ratio: 3.2 (calc) (ref <5.0)
Triglycerides: 78 mg/dL (ref <150)

## 2020-05-27 LAB — HEMOGLOBIN A1C
Hgb A1c MFr Bld: 5.7 % of total Hgb — ABNORMAL HIGH (ref <5.7)
Mean Plasma Glucose: 117 mg/dL
eAG (mmol/L): 6.5 mmol/L

## 2020-05-27 NOTE — Progress Notes (Signed)
Labs are good except his kidney function which continues to decline.  He should not be taking nsaids like ibuprofen/motrin/advil, aleve, goody's, bc powder and needs to be careful with herbal supplements because we do not know about their effects on the kidneys and how they interact with his prescriptions.

## 2020-05-29 ENCOUNTER — Ambulatory Visit (INDEPENDENT_AMBULATORY_CARE_PROVIDER_SITE_OTHER): Payer: Medicare HMO | Admitting: Internal Medicine

## 2020-05-29 ENCOUNTER — Other Ambulatory Visit: Payer: Self-pay

## 2020-05-29 ENCOUNTER — Encounter: Payer: Self-pay | Admitting: Internal Medicine

## 2020-05-29 VITALS — BP 138/82 | HR 72 | Temp 97.7°F | Ht 70.0 in | Wt 218.6 lb

## 2020-05-29 DIAGNOSIS — R7303 Prediabetes: Secondary | ICD-10-CM | POA: Diagnosis not present

## 2020-05-29 DIAGNOSIS — E782 Mixed hyperlipidemia: Secondary | ICD-10-CM | POA: Diagnosis not present

## 2020-05-29 DIAGNOSIS — I509 Heart failure, unspecified: Secondary | ICD-10-CM

## 2020-05-29 DIAGNOSIS — I10 Essential (primary) hypertension: Secondary | ICD-10-CM | POA: Diagnosis not present

## 2020-05-29 DIAGNOSIS — I110 Hypertensive heart disease with heart failure: Secondary | ICD-10-CM | POA: Diagnosis not present

## 2020-05-29 DIAGNOSIS — D692 Other nonthrombocytopenic purpura: Secondary | ICD-10-CM | POA: Diagnosis not present

## 2020-05-29 DIAGNOSIS — I25810 Atherosclerosis of coronary artery bypass graft(s) without angina pectoris: Secondary | ICD-10-CM

## 2020-05-29 NOTE — Progress Notes (Signed)
Location:  [facility] Provider:  Isom Kochan L. Mariea Clonts, D.O., C.M.D.  Goals of Care:  Advanced Directives 05/29/2020  Does Patient Have a Medical Advance Directive? Yes  Type of Advance Directive -  Does patient want to make changes to medical advance directive? No - Patient declined  Copy of [place] in Chart? -  Would patient like information on creating a medical advance directive? -     Chief Complaint  Patient presents with  . Medical Management of Chronic Issues    3 month follow up. Patient has a bruise on his right arm and would like to discuss if one the medications he's taking had any thing to do with it.     HPI: Patient is a 87 y.o. male seen today for medical management of chronic diseases.    Scratched his right forearm on the cabinet door and bled all over.  He's on clopidogrel instead of brillinta now per cardiology.  He's taking it first thing each morning with his bp medication.   He's walking 3000 steps per day.  Has not been able to increase.    No chest pain.  Not really having shortness of breath.  It he really exerts himself, but he has not done that lately.  He fell on his right leg and it swelled.  No pain.  Then got pain in left knee.  He's been taking swanson mobility essentials which is glucosamine chondroitin and several other supplements--discussed risks of interaction with plavix causing bleeding with some of the supplements and avoiding those.  He originally had 6 blockages with his CAD before his CABG.  Had recently had MI and cholesterol is at goal now.    He talks about how he's used ibuprofen every few days at night.  I've advised against this due to his kidney decline and he's stopped it.   GFR is 58 with normal 60.  He also takes tylenol at bedtime--advised to use it and not the ibuprofen.  Past Medical History:  Diagnosis Date  . Acquired deformity of nose   . Calculus of kidney   . Cellulitis and abscess of unspecified site    . Cervicalgia   . Chronic heart failure with preserved ejection fraction (HFpEF) (HCC)    EF ~45-50%  . Dizziness and giddiness   . Encounter for long-term (current) use of other medications   . Esophagitis, unspecified   . Hyperlipidemia   . Hypertrophy of prostate with urinary obstruction and other lower urinary tract symptoms (LUTS)   . Insomnia, unspecified   . Internal hemorrhoids without mention of complication   . Left carotid artery stenosis    S/P L CEA - 2015 (Dr. Trula Slade)  . Multiple vessel coronary artery disease 2000   ~50% LM-ostLAD with 85% ost-mid LCx, 80% ost RI, 100% CTO RCA -> CABG X 4 (LIMA-LAD, RIMA-dRCA, SVG-OM2, SVG-RI) (Dr. Roxan Hockey) => 06/2019: STEMI : 100% SVG-OM2 (unable to open), 100% OM2; DES PCI LM-mLCx.   . Obesity, unspecified   . Old myocardial infarction   . Other and unspecified hyperlipidemia   . Palpitations   . 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 Slowing of urinary stream   . Special screening for malignant neoplasm of prostate   . Unspecified essential hypertension   . 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 Unspecified vitamin D deficiency   . Urinary frequency  Past Surgical History:  Procedure Laterality Date  . CAROTID ENDARTERECTOMY Left 06-14-14   CE  . COLONOSCOPY  2007   Dr Lajoyce Corners, normal  . CORONARY ARTERY BYPASS GRAFT  2000   Dr. Roxan Hockey - CABG x 4:  LIMA-LAD, RIMA-dRCA, SVG-OM2, SVG-RI  . CORONARY STENT INTERVENTION N/A 06/22/2019   Procedure: CORONARY STENT INTERVENTION;  Surgeon: Leonie Man, MD;  Location: [facility]; unsuccessful attempt at revascularization of SVG-OM2 => Synergy DES PCI from LM-mid LCx (Synergy DES 2.5 mm x 38 mm--> 3.1-2.6 mm); unfortunately OM2 was occluded.  This revascularized OM1, OM 3 and OM 4.  . CORONARY/GRAFT ACUTE MI REVASCULARIZATION N/A 06/22/2019   Procedure: CORONARY/GRAFT ACUTE MI  REVASCULARIZATION;  Surgeon: Leonie Man, MD;  Location: [place] [facility];; unsuccessful PTCA/aspiration thrombectomy of SVG-OM1 as culprit lesion for STEMI  . ENDARTERECTOMY Left 06/14/2014   Procedure: LEFT CAROTID ENDARTERECTOMY WITH VASCUGUARD PATCH ANGIOPLASTY;  Surgeon: Serafina Mitchell, MD;  Location: [facility];  Service: Vascular;  Laterality: Left;  . ESOPHAGOGASTRODUODENOSCOPY ENDOSCOPY  2007   Dr Lajoyce Corners, with biopsy  . EXCISIONAL HEMORRHOIDECTOMY  1981  . LEFT HEART CATH AND CORONARY ANGIOGRAPHY  2000   MV CAD --> CABG x 4  . LEFT HEART CATH AND CORS/GRAFTS ANGIOGRAPHY N/A 06/22/2019   Procedure: LEFT HEART CATH AND CORS/GRAFTS ANGIOGRAPHY;  Surgeon: Leonie Man, MD;  CULPRIT LESION = 100% SVG-OM2; Severe native CAD: mLM-Ost LAD-50% w/ Ost LCx 85% (DES PCI OM-OstLCx)- Treated b/c 100% TO SVG-OM2 as Culprit lesion - unable to open. OM2 100%. Ost RI 80%, Ost-mid LAD 50% - 100% mLAD after D1; ost RI 80%, mid RCA 100%; Patent SVG-RI, pedLIMA-dLAD, pedRIMA-dRCA; EF 45-50%  . LESION FROM FOREHEAD,(R) EAR (L) ARM     DR Almira Coaster  . LITHOTRIPSY  2005   Dr Almira Coaster  . TONSILLECTOMY  1940  . TRANSTHORACIC ECHOCARDIOGRAM  06/23/2019   Inferolateral STEMI (occluded SVG-OM2): (Definity) mildly reduced LV function-EF 45 to 50%.  GR 1 DD.  No obvious R WMA.  Essentially normal valves.  Mild MR.  . trench in right side of jaw after abcesses  06/01/2016    No Known Allergies  Outpatient Encounter Medications as of 05/29/2020  Medication Sig  . ascorbic acid (VITAMIN C) 500 MG tablet Take 500-1,000 mg by mouth daily.  Marland Kitchen aspirin EC 81 MG tablet Take 1 tablet (81 mg total) by mouth daily.  Marland Kitchen b complex vitamins tablet Take 1 tablet by mouth daily.  . Cholecalciferol (VITAMIN D3) 125 MCG (5000 UT) CAPS Take 5,000 Units by mouth daily.  . Cinnamon 500 MG TABS Take 500 mg by mouth 3 (three) times a week.   Marland Kitchen CITRUS BERGAMOT PO Take 1 capsule by mouth 2 (two) times a week.   . clopidogrel  (PLAVIX) 75 MG tablet Take 1 tablet (75 mg total) by mouth daily. ( the initial first day take 4 tablets only)  . Coenzyme Q10 (CO Q 10) 100 MG CAPS Take 100 mg by mouth daily.   . Cyanocobalamin (VITAMIN B-12 PO) Take 1 tablet by mouth daily.  . diazepam (VALIUM) 5 MG tablet TAKE 1/2 TABLET BY MOUTH TWICE DAILY  . doxepin (SINEQUAN) 50 MG capsule TAKE 1 CAPSULE BY MOUTH AT BEDTIME AS NEEDED FOR INSOMNIA OR ANXIETY  . Fluticasone-Salmeterol (ADVAIR) 250-50 MCG/DOSE AEPB Inhale 1 puff into the lungs once a week.   . furosemide (LASIX) 20 MG tablet TAKE 1 TABLET BY MOUTH ONCE DAILY AS NEEDED FOR FLUID  RETENTION  . Grape Seed OIL Take 1 capsule by mouth daily.  Nyoka Cowden Tea, Camellia sinensis, (GREEN TEA EXTRACT PO) Take 1 capsule by mouth daily.  . metoprolol succinate (TOPROL-XL) 50 MG 24 hr tablet TAKE 1 TABLET BY MOUTH EVERY DAY WITH FOOD OR IMMEDIATELY FOLLOWING A MEAL.  . nitroGLYCERIN (NITROSTAT) 0.4 MG SL tablet Place 1 tablet (0.4 mg total) under the tongue every 5 (five) minutes as needed.  . ramipril (ALTACE) 10 MG capsule TAKE 1 CAPSULE BY MOUTH EVERY DAY  . RESVERATROL 100 MG CAPS Take 100 mg by mouth daily.   . rosuvastatin (CRESTOR) 40 MG tablet Take 1 tablet (40 mg total) by mouth every Monday, Wednesday, and Friday.  . TURMERIC PO Take 1 capsule by mouth as needed.   Marland Kitchen UNABLE TO FIND Take 1 capsule by mouth See admin instructions. Swanson Mediterranean Nutrient capsules: Take 1 capsule by mouth once a day  . [DISCONTINUED] ibuprofen (ADVIL) 200 MG tablet Take 200-400 mg by mouth every 6 (six) hours as needed for headache or mild pain.   No facility-administered encounter medications on file as of 05/29/2020.    Review of Systems:  Review of Systems  Constitutional: Negative for chills, fever and malaise/fatigue.  HENT: Positive for hearing loss. Negative for congestion and sore throat.   Eyes: Negative for blurred vision.  Respiratory: Negative for cough and shortness of breath.    Cardiovascular: Negative for chest pain, palpitations and leg swelling.  Gastrointestinal: Negative for abdominal pain and constipation.  Genitourinary: Negative for dysuria.  Musculoskeletal: Positive for falls and joint pain.  Neurological: Negative for dizziness, loss of consciousness and weakness.  Endo/Heme/Allergies: Bruises/bleeds easily.  Psychiatric/Behavioral: Positive for memory loss. Negative for depression. The patient is not nervous/anxious and does not have insomnia.     Health Maintenance  Topic Date Due  . TETANUS/TDAP  05/03/2014  . INFLUENZA VACCINE  Completed  . COVID-19 Vaccine  Completed  . PNA vac Low Risk Adult  Completed    Physical Exam: Vitals:   05/29/20 1149  BP: 138/82  Pulse: 72  Temp: 97.7 F (36.5 C)  TempSrc: Temporal  SpO2: 97%  Weight: 218 lb 9.6 oz (99.2 kg)  Height: 5\' 10"  (1.778 m)   Body mass index is 31.37 kg/m. Physical Exam Vitals reviewed.  Constitutional:      General: He is not in acute distress.    Appearance: He is obese. He is not toxic-appearing.  HENT:     Ears:     Comments: HOH Eyes:     Comments: glasses  Cardiovascular:     Rate and Rhythm: Normal rate and regular rhythm.     Pulses: Normal pulses.     Heart sounds: Normal heart sounds.  Pulmonary:     Effort: Pulmonary effort is normal.     Breath sounds: Normal breath sounds. No wheezing, rhonchi or rales.  Abdominal:     General: Bowel sounds are normal.     Tenderness: There is no abdominal tenderness. There is no guarding or rebound.  Musculoskeletal:        General: No tenderness. Normal range of motion.     Right lower leg: No edema.     Left lower leg: No edema.  Skin:    General: Skin is warm and dry.  Neurological:     General: No focal deficit present.     Mental Status: He is alert and oriented to person, place, and time. Mental status is at baseline.  Cranial Nerves: No cranial nerve deficit.     Motor: No weakness.     Gait: Gait  normal.     Comments: Some short-term memory loss, has to write things down and use a pillbox  Psychiatric:        Mood and Affect: Mood normal.        Behavior: Behavior normal.     Labs reviewed: Basic Metabolic Panel: Recent Labs    06/25/19 0123 01/17/20 0855 05/26/20 0925  NA 137 140 139  K 3.5 3.9 4.1  CL 105 104 105  CO2 21* 28 28  GLUCOSE 108* 93 89  BUN 13 16 17   CREATININE 1.03 1.14* 1.21*  CALCIUM 8.5* 9.2 8.8   Liver Function Tests: Recent Labs    06/22/19 0729 01/17/20 0855  AST 21 13  ALT 17 12  ALKPHOS 35*  --   BILITOT 0.8 0.5  PROT 6.6 6.9  ALBUMIN 3.5  --    No results for input(s): LIPASE, AMYLASE in the last 8760 hours. No results for input(s): AMMONIA in the last 8760 hours. CBC: Recent Labs    06/22/19 0729 06/22/19 0808 06/24/19 0124 06/25/19 0123 01/17/20 0855  WBC 9.6   < > 8.9 9.3 6.5  NEUTROABS 7.1  --   --   --  3,569  HGB 14.0   < > 13.0 13.2 14.6  HCT 43.5   < > 38.3* 38.8* 45.6  MCV 101.6*   < > 96.2 96.3 101.1*  PLT 218   < > 165 183 243   < > = values in this interval not displayed.   Lipid Panel: Recent Labs    06/23/19 0549 01/17/20 0855 05/26/20 0925  CHOL 153 225* 120  HDL 34* 44 38*  LDLCALC 102* 162* 66  TRIG 83 87 78  CHOLHDL 4.5 5.1* 3.2   Lab Results  Component Value Date   HGBA1C 5.7 (H) 05/26/2020    Procedures since last visit: VAS US CAROTID  Result Date: 05/21/2020 Carotid Arterial Duplex Study Indications:       Carotid artery disease. Risk Factors:      Hypertension, hyperlipidemia, coronary artery disease. Other Factors:     Left carotid endarterectomy 06/14/2014. Comparison Study:  No change since prior exam of 11/27/2018 Performing Technologist: Alvia Grove RVT  Examination Guidelines: A complete evaluation includes B-mode imaging, spectral Doppler, color Doppler, and power Doppler as needed of all accessible portions of each vessel. Bilateral testing is considered an integral part of a  complete examination. Limited examinations for reoccurring indications may be performed as noted.  Right Carotid Findings: +----------+--------+--------+--------+------------------+--------+           PSV cm/sEDV cm/sStenosisPlaque DescriptionComments +----------+--------+--------+--------+------------------+--------+ CCA Prox  98      9                                          +----------+--------+--------+--------+------------------+--------+ CCA Mid   86      11                                         +----------+--------+--------+--------+------------------+--------+ CCA Distal86      16              homogeneous                +----------+--------+--------+--------+------------------+--------+  ICA Prox  84      13      1-39%                              +----------+--------+--------+--------+------------------+--------+ ICA Mid   60      14                                         +----------+--------+--------+--------+------------------+--------+ ICA Distal76      17                                         +----------+--------+--------+--------+------------------+--------+ ECA       156     9                                          +----------+--------+--------+--------+------------------+--------+ +----------+--------+-------+----------------+-------------------+           PSV cm/sEDV cmsDescribe        Arm Pressure (mmHG) +----------+--------+-------+----------------+-------------------+ Subclavian151     3      Multiphasic, WNL                    +----------+--------+-------+----------------+-------------------+ +---------+--------+--+--------+-+---------+ VertebralPSV cm/s50EDV cm/s9Antegrade +---------+--------+--+--------+-+---------+  Left Carotid Findings: +----------+--------+--------+--------+------------------+--------+           PSV cm/sEDV cm/sStenosisPlaque DescriptionComments  +----------+--------+--------+--------+------------------+--------+ CCA Prox  130     11              homogeneous                +----------+--------+--------+--------+------------------+--------+ CCA Mid   153     17              heterogenous               +----------+--------+--------+--------+------------------+--------+ CCA Distal107     10              heterogenous               +----------+--------+--------+--------+------------------+--------+ ICA Prox  79      8       1-39%                              +----------+--------+--------+--------+------------------+--------+ ICA Mid   72      23                                         +----------+--------+--------+--------+------------------+--------+ ICA Distal74      25                                         +----------+--------+--------+--------+------------------+--------+ ECA       142     4                                          +----------+--------+--------+--------+------------------+--------+ +----------+--------+--------+----------------+-------------------+  PSV cm/sEDV cm/sDescribe        Arm Pressure (mmHG) +----------+--------+--------+----------------+-------------------+ Subclavian172     4       Multiphasic, WNL                    +----------+--------+--------+----------------+-------------------+ +---------+--------+--+--------+--+---------+ VertebralPSV cm/s49EDV cm/s13Antegrade +---------+--------+--+--------+--+---------+   Summary: Right Carotid: Velocities in the right ICA are consistent with a 1-39% stenosis. Left Carotid: Patent carotid endarterectomy site with velocity in the left ICA               consistent with a 1-39% stenosis. Vertebrals:  Bilateral vertebral arteries demonstrate antegrade flow. Subclavians: Normal flow hemodynamics were seen in bilateral subclavian              arteries. *See table(s) above for measurements and observations.   Electronically signed by Deitra Mayo MD on 05/21/2020 at 2:27:24 PM.    Final     Assessment/Plan 1. Senile purpura (HCC) -ongoing, had a few places on his arms, educated due to plavix and advanced age with loss of collagen and elastin  2. Coronary artery disease involving coronary bypass graft of native heart without angina pectoris -cont plavix, bp and lipid control--emphasized how crucial it is that he take all of his meds as directed and avoid supplements that may interact  3. Prediabetes -encouraged him to up his steps--walks in [employer] but he is quite talkative and admits he does not really exert himself -encouraged healthy diet low in carbs and fats  4. Mixed hyperlipidemia -cont statin therapy which he's finally taking as directed and educated about diet again  5. Essential hypertension -cont bp regimen as is--may need to tweak if he'll agree as still 138/82 with his CAD  6. Other congestive heart failure (HCC) -no signs of volume overload at present, cont prn lasix for fluid retention--not needed often -follow low sodium diet  Note he's ben on doxepin and valium from prior PCP and I've never been able to wean him despite best efforts early on (probably 9-10 yrs ago)  Labs/tests ordered:  Lab Orders  No laboratory test(s) ordered today   Next appt:  09/16/2020   Kylinn Shropshire L. Lleyton Byers, D.O. [facility] [address], [place] [address] Cell Phone (Mon-Fri 8am-5pm):  [phone] On Call:  [phone] & follow prompts after 5pm & weekends Office Phone:  [phone] Office Fax:  [phone]

## 2020-06-23 ENCOUNTER — Encounter: Payer: Self-pay | Admitting: Internal Medicine

## 2020-06-27 DIAGNOSIS — E785 Hyperlipidemia, unspecified: Secondary | ICD-10-CM | POA: Diagnosis not present

## 2020-06-27 DIAGNOSIS — I2129 ST elevation (STEMI) myocardial infarction involving other sites: Secondary | ICD-10-CM | POA: Diagnosis not present

## 2020-06-27 DIAGNOSIS — I25119 Atherosclerotic heart disease of native coronary artery with unspecified angina pectoris: Secondary | ICD-10-CM | POA: Diagnosis not present

## 2020-06-28 LAB — COMPREHENSIVE METABOLIC PANEL
ALT: 16 IU/L (ref 0–44)
AST: 18 IU/L (ref 0–40)
Albumin/Globulin Ratio: 1.6 (ref 1.2–2.2)
Albumin: 4.3 g/dL (ref 3.6–4.6)
Alkaline Phosphatase: 49 IU/L (ref 44–121)
BUN/Creatinine Ratio: 11 (ref 10–24)
BUN: 12 mg/dL (ref 8–27)
Bilirubin Total: 0.5 mg/dL (ref 0.0–1.2)
CO2: 23 mmol/L (ref 20–29)
Calcium: 9.3 mg/dL (ref 8.6–10.2)
Chloride: 101 mmol/L (ref 96–106)
Creatinine, Ser: 1.05 mg/dL (ref 0.76–1.27)
GFR calc Af Amer: 74 mL/min/{1.73_m2} (ref >59)
GFR calc non Af Amer: 64 mL/min/{1.73_m2} (ref >59)
Globulin, Total: 2.7 g/dL (ref 1.5–4.5)
Glucose: 93 mg/dL (ref 65–99)
Potassium: 4.2 mmol/L (ref 3.5–5.2)
Sodium: 141 mmol/L (ref 134–144)
Total Protein: 7 g/dL (ref 6.0–8.5)

## 2020-06-28 LAB — LIPID PANEL
Chol/HDL Ratio: 3.4 ratio (ref 0.0–5.0)
Cholesterol, Total: 137 mg/dL (ref 100–199)
HDL: 40 mg/dL (ref >39)
LDL Chol Calc (NIH): 82 mg/dL (ref 0–99)
Triglycerides: 78 mg/dL (ref 0–149)
VLDL Cholesterol Cal: 15 mg/dL (ref 5–40)

## 2020-06-30 ENCOUNTER — Telehealth: Payer: Self-pay | Admitting: *Deleted

## 2020-06-30 NOTE — Telephone Encounter (Signed)
The patient left the office before the visit was finished. DETAIL MESSAGE  on answer machine - try an increase to 5 days a week of Rosuvastatin . Any question may call back  Patient has follow up appointment 3/323/22 at 4 pm

## 2020-06-30 NOTE — Telephone Encounter (Signed)
-----   Message from Leonie Man, MD sent at 06/30/2020  1:57 PM EST ----- Follow-up lipid panel shows LDL 82 total cholesterol 137.  Compared to 1 month ago, LDL is up a little and total cholesterol is also up.  Likely represents a different assay.  Would like to see LDL less than 70.  For now, continue rosuvastatin-if possible increase to 5 days a week.  Glenetta Hew, MD

## 2020-07-15 ENCOUNTER — Other Ambulatory Visit: Payer: Self-pay | Admitting: Internal Medicine

## 2020-07-15 DIAGNOSIS — I10 Essential (primary) hypertension: Secondary | ICD-10-CM

## 2020-07-23 ENCOUNTER — Ambulatory Visit (INDEPENDENT_AMBULATORY_CARE_PROVIDER_SITE_OTHER): Payer: Medicare HMO | Admitting: Cardiology

## 2020-07-23 ENCOUNTER — Encounter: Payer: Self-pay | Admitting: Cardiology

## 2020-07-23 ENCOUNTER — Other Ambulatory Visit: Payer: Self-pay

## 2020-07-23 VITALS — BP 132/72 | HR 68 | Ht 70.0 in | Wt 219.6 lb

## 2020-07-23 DIAGNOSIS — I10 Essential (primary) hypertension: Secondary | ICD-10-CM

## 2020-07-23 DIAGNOSIS — E785 Hyperlipidemia, unspecified: Secondary | ICD-10-CM | POA: Diagnosis not present

## 2020-07-23 DIAGNOSIS — I6522 Occlusion and stenosis of left carotid artery: Secondary | ICD-10-CM | POA: Diagnosis not present

## 2020-07-23 DIAGNOSIS — I25119 Atherosclerotic heart disease of native coronary artery with unspecified angina pectoris: Secondary | ICD-10-CM | POA: Diagnosis not present

## 2020-07-23 DIAGNOSIS — Z951 Presence of aortocoronary bypass graft: Secondary | ICD-10-CM | POA: Diagnosis not present

## 2020-07-23 DIAGNOSIS — R600 Localized edema: Secondary | ICD-10-CM | POA: Diagnosis not present

## 2020-07-23 DIAGNOSIS — I2129 ST elevation (STEMI) myocardial infarction involving other sites: Secondary | ICD-10-CM | POA: Diagnosis not present

## 2020-07-23 DIAGNOSIS — I255 Ischemic cardiomyopathy: Secondary | ICD-10-CM | POA: Diagnosis not present

## 2020-07-23 MED ORDER — EZETIMIBE 10 MG PO TABS: 10.0000 mg | ORAL_TABLET | Freq: Every day | ORAL | 3 refills | Status: DC

## 2020-07-23 NOTE — Patient Instructions (Signed)
Medication Instructions:  START: ZETIA 10mg  DAILY *If you need a refill on your cardiac medications before your next appointment, please call your pharmacy*  Lab Work: CMP/LIPIDS in 4 MONTHS If you have labs (blood work) drawn today and your tests are completely normal, you will receive your results only by: Marland Kitchen MyChart Message (if you have MyChart) OR . A paper copy in the mail If you have any lab test that is abnormal or we need to change your treatment, we will call you to review the results.  Follow-Up: At [facility], you and your health needs are our priority.  As part of our continuing mission to provide you with exceptional heart care, we have created designated Provider Care Teams.  These Care Teams include your primary Cardiologist (physician) and Advanced Practice Providers (APPs -  Physician Assistants and Nurse Practitioners) who all work together to provide you with the care you need, when you need it.  We recommend signing up for the patient portal called "MyChart".  Sign up information is provided on this After Visit Summary.  MyChart is used to connect with patients for Virtual Visits (Telemedicine).  Patients are able to view lab/test results, encounter notes, upcoming appointments, etc.  Non-urgent messages can be sent to your provider as well.   To learn more about what you can do with MyChart, go to [email or web address].    Your next appointment:   6 month(s)  The format for your next appointment:   In Person  Provider:   Glenetta Hew, MD

## 2020-07-23 NOTE — Progress Notes (Signed)
Primary Care Provider: Wardell Honour, MD Cardiologist: Glenetta Hew, MD Electrophysiologist: None  Clinic Note: Chief Complaint  Patient presents with  . Follow-up    Doing well.  No cardiac complaints.  . Coronary Artery Disease    No angina  . Hyperlipidemia    See below    ===================================  ASSESSMENT/PLAN   Problem List Items Addressed This Visit    Coronary artery disease involving native coronary artery of native heart with angina pectoris ([place]) - Primary (Chronic)    Severe native CAD with occlusion of the RCA, mid LAD, and OM2 with severe ostial D1, RI disease along with occluded SVG-OM2.  PCI of left main into the proximal mid LCx.  Otherwise patent LIMA-LAD RI, SVG-dRCA.  Plan  Based on the stent DES, will continue Plavix but okay to stop aspirin, since he is a year out.  At this point it is okay for him to hold Plavix as needed for surgical procedure 5 days preop  Continue moderate dose metoprolol along with ACE inhibitor.  We converted him from atorvastatin to rosuvastatin and Zetia today.      Relevant Medications   ezetimibe (ZETIA) 10 MG tablet   ST elevation myocardial infarction (STEMI) of true posterior wall (HCC) (Chronic)    Now just about a year out from his posterior STEMI with occluded SVG-OM2.  Both the native and graft of the OM 2 are occluded but the remainder of the circumflex has been revascularized percutaneously with Left Main-LCx PCI. Does have a reduced EF 45 to 50% with Posterolateral Regional Wall Motion Normality.  No heart failure symptoms of PND, orthopnea with trivial edema.  Remains very active with no recurrent symptoms.      Relevant Medications   ezetimibe (ZETIA) 10 MG tablet   Hx of CABG x4 (LIMA-LAD, RIMA-dRCA, SVG-OM2, SVG-RI) -- OCCLUDED SVG-OM (Chronic)    3 of 4 grafts patent.  Occluded SVG-OM2, now with DES PCI of left main and LCx.  Native OM2 is also occluded.      Hyperlipidemia with  target LDL less than 70 (Chronic)    LDL still not at goal.  Currently 82 on rosuvastatin.  Plan: Add Zetia 10 mg daily, recheck labs in 4 months.      Relevant Medications   ezetimibe (ZETIA) 10 MG tablet   Other Relevant Orders   Comprehensive metabolic panel   Lipid panel   Essential hypertension (Chronic)    Blood pressures pretty well controlled today on Toprol, and ramipril which he is taking every other day. (He says it is easier to take it every other day then take a half a tablet daily)       Relevant Medications   ezetimibe (ZETIA) 10 MG tablet   Other Relevant Orders   Comprehensive metabolic panel   Lipid panel   Left carotid artery stenosis - s/p LCEA (Chronic)    He underwent L CEA in February 2016.  Now 6 years out with follow-up carotid Doppler showing patent OpSite.  Continue current treatment with aspirin, Plavix, beta-blocker and ACE inhibitor along with statin for lipid control..      Relevant Medications   ezetimibe (ZETIA) 10 MG tablet   Bilateral lower extremity edema (Chronic)    No real PND orthopnea.  I do not think that his edema is related to heart failure.  He remains on furosemide that he takes just but every day but not always and rarely takes additional dose.  Ischemic cardiomyopathy (Chronic)    Mildly reduced LVEF with expected wall motion abnormality from OM2 occlusion.  No active heart failure symptoms -> NYHA class I.  Edema seems more related to venous stasis & not CHF.  He is on Toprol and ramipril at stable doses, along with as needed furosemide. Well-controlled.  No change      Relevant Medications   ezetimibe (ZETIA) 10 MG tablet     ===================================  HPI:    Alex [name] is a 87 y.o. male with a PMH notable for CAD-CABG (2000) & posterior STEMI (06/2019; SVG-OM Occluded, LM-LCx PCI), HTN, HLD, Left Carotid Artery Disease (LCEA April 2016) below who presents today for 8-month follow-up.  Alex [name] was  last seen on March 31, 2020 as his first follow-up with me after his MI.  He continues to walk about [phone] steps a day usually going to walk at the nearby [employer]-(provide a safe and climate controlled place to walk).  He recently injured his knee doing yard work, and now is keeping him from doing his routine walking.  No chest pain or pressure, just mostly cramping in the right calf after his injury. -> In addition to his walking, he is also made dramatic changes to his diet since his MRI and was trying to lose weight.,  Unfortunately after the knee injury, he gained some.  Rosuvastatin was reduced to 40 mg 3 days a week.  Labs ordered for February 2022  Recent Hospitalizations: None  Reviewed  CV studies:    The following studies were reviewed today: (if available, images/films reviewed: From Epic Chart or Care Everywhere) . Carotid Dopplers 05/21/2020: R ICA 1-39% stenosis.  L carotid, patent right endarterectomy site with velocity and LICA consistent with 139% stenosis.  Normal antegrade bilateral vertebral artery flow as well as subclavian arteries.   Interval History:   Alex [name] returns for his 24-month follow-up to discuss results of his lipid panel.  He is doing fine from a cardiac standpoint.  No issues, chest tightness or dyspnea standpoint.  He has reduced his walking to about 3000 steps a day at the [employer], indicating that the knee pain has kept him from doing as much.  He denies any claudication symptoms or or dyspnea, just since his injury, has not been as energetic.Marland Kitchen  CV Review of Symptoms (Summary): no chest pain or dyspnea on exertion positive for - Somewhat reduced energy level, getting better negative for - edema, irregular heartbeat, orthopnea, palpitations, paroxysmal nocturnal dyspnea, rapid heart rate or shortness of breath  The patient does not have symptoms concerning for COVID-19 infection (fever, chills, cough, or new shortness of breath).    REVIEWED OF SYSTEMS   Review of Systems  Constitutional: Negative for malaise/fatigue (Just does not have much energy since he had take a break from walking, but now getting better.) and weight loss.  HENT: Negative for congestion.   Respiratory: Negative for shortness of breath.   Cardiovascular: Negative for chest pain and leg swelling (Not that he pays attention to).  Gastrointestinal: Negative for abdominal pain, blood in stool and melena.  Genitourinary: Negative for hematuria.  Musculoskeletal: Positive for joint pain (His right thumb is swollen painful).  Neurological: Positive for focal weakness (Focal right hand weakness with thumb numbness). Negative for dizziness and weakness.  Psychiatric/Behavioral: Negative for depression and memory loss. The patient is not nervous/anxious and does not have insomnia.     I have reviewed and (if needed) personally  updated the patient's problem list, medications, allergies, past medical and surgical history, social and family history.   PAST MEDICAL HISTORY   Past Medical History:  Diagnosis Date  . Acquired deformity of nose   . Calculus of kidney   . Cervicalgia   . Chronic heart failure with preserved ejection fraction (HFpEF) (HCC)    EF ~45-50%  . Esophagitis, unspecified   . Essential hypertension   . Hyperlipidemia   . Hypertrophy of prostate with urinary obstruction and other lower urinary tract symptoms (LUTS)   . Insomnia, unspecified   . Internal hemorrhoids without mention of complication   . Left carotid artery stenosis    S/P L CEA - 2015 (Dr. Trula Slade)  . Multiple vessel coronary artery disease 2000   ~50% LM-ostLAD with 85% ost-mid LCx, 80% ost RI, 100% CTO RCA -> CABG X 4 (LIMA-LAD, RIMA-dRCA, SVG-OM2, SVG-RI) (Dr. Roxan Hockey) => 06/2019: STEMI : 100% SVG-OM2 (unable to open), 100% OM2; DES PCI LM-mLCx.   . Obesity, unspecified   . Old myocardial infarction 06/2019   True posterior STEMI -> occluded SVG.  Native  LM--LCX PCI  . Other and unspecified hyperlipidemia   . 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 Unspecified vitamin D deficiency   . Urinary frequency     PAST SURGICAL HISTORY   Past Surgical History:  Procedure Laterality Date  . CAROTID ENDARTERECTOMY Left 06-14-14   CE  . COLONOSCOPY  2007   Dr Lajoyce Corners, normal  . CORONARY ARTERY BYPASS GRAFT  2000   Dr. Roxan Hockey - CABG x 4:  LIMA-LAD, RIMA-dRCA, SVG-OM2, SVG-RI  . CORONARY STENT INTERVENTION N/A 06/22/2019   Procedure: CORONARY STENT INTERVENTION;  Surgeon: Leonie Man, MD;  Location: [facility]; unsuccessful attempt at revascularization of SVG-OM2 => Synergy DES PCI from LM-mid LCx (Synergy DES 2.5 mm x 38 mm--> 3.1-2.6 mm); unfortunately OM2 was occluded.  This revascularized OM1, OM 3 and OM 4.  . CORONARY/GRAFT ACUTE MI REVASCULARIZATION N/A 06/22/2019   Procedure: CORONARY/GRAFT ACUTE MI REVASCULARIZATION;  Surgeon: Leonie Man, MD;  Location: [facility];; unsuccessful PTCA/aspiration thrombectomy of SVG-OM1 as culprit lesion for STEMI  . ENDARTERECTOMY Left 06/14/2014   Procedure: LEFT CAROTID ENDARTERECTOMY WITH VASCUGUARD PATCH ANGIOPLASTY;  Surgeon: Serafina Mitchell, MD;  Location: [facility];  Service: Vascular;  Laterality: Left;  . ESOPHAGOGASTRODUODENOSCOPY ENDOSCOPY  2007   Dr Lajoyce Corners, with biopsy  . EXCISIONAL HEMORRHOIDECTOMY  1981  . LEFT HEART CATH AND CORONARY ANGIOGRAPHY  2000   MV CAD --> CABG x 4  . LEFT HEART CATH AND CORS/GRAFTS ANGIOGRAPHY N/A 06/22/2019   Procedure: LEFT HEART CATH AND CORS/GRAFTS ANGIOGRAPHY;  Surgeon: Leonie Man, MD;  CULPRIT LESION = 100% SVG-OM2; Severe native CAD: mLM-Ost LAD-50% w/ Ost LCx 85% (DES PCI OM-OstLCx)- Treated b/c 100% TO SVG-OM2 as Culprit lesion - unable to open. OM2 100%. Ost RI 80%, Ost-mid LAD 50% - 100% mLAD after D1; ost RI 80%,  mid RCA 100%; Patent SVG-RI, pedLIMA-dLAD, pedRIMA-dRCA; EF 45-50%  . LESION FROM FOREHEAD,(R) EAR (L) ARM     DR Almira Coaster  . LITHOTRIPSY  2005   Dr Almira Coaster  . TONSILLECTOMY  1940  . TRANSTHORACIC ECHOCARDIOGRAM  06/23/2019   Inferolateral STEMI (occluded SVG-OM2): (Definity) mildly reduced LV function-EF 45 to 50%.  GR 1 DD.  No obvious R WMA.  Essentially normal valves.  Mild MR.  . trench in right side of jaw after abcesses  06/01/2016    Cardiac Cath - PCI (06/22/2019): CULPRIT LESION = 100% SVG-OM2 - unsuccessful PTCA wit aspiration thrombectomy; Severe native CAD: mLM-Ost LAD-50% w/ Ost LCx 85% (DES PCI OM-OstLCx Synergy DES 2.5 x 38 (3.1-2.6 mm) - Treated b/c 100% Occluded SVG-OM2 as Culprit lesion. OM2 100%. Ost RI 80%, Ost-mid LAD 50% - 100% CTO after D1. 100% mRCA; Patent PedLIMA-dLAD, SVG-RI, PedRIMA-dRCA. Moderately Elevated LVEDP. EF ~45-50%.    Echocardiogram 06/23/2019: (Definity) mildly reduced LV function-EF 45 to 50%.  GR 1 DD.  No obvious R WMA.  Essentially normal valves.  Mild MR.    Immunization History  Administered Date(s) Administered  . Fluad Quad(high Dose 65+) 01/18/2019, 01/21/2020  . Influenza Whole 02/06/2013  . Influenza, High Dose Seasonal PF 12/09/2015, 01/10/2017, 01/19/2018  . Influenza-Unspecified 01/31/2009, 01/08/2014, 12/13/2014  . Moderna Sars-Covid-2 Vaccination 07/12/2019, 08/15/2019, 03/29/2020  . Pneumococcal Conjugate-13 11/08/2013  . Pneumococcal-Unspecified 05/03/2004  . Td 05/03/2004  . Zoster 07/19/2012    MEDICATIONS/ALLERGIES   Current Meds  Medication Sig  . ascorbic acid (VITAMIN C) 500 MG tablet Take 500-1,000 mg by mouth daily.  Marland Kitchen aspirin EC 81 MG tablet Take 1 tablet (81 mg total) by mouth daily.  Marland Kitchen b complex vitamins tablet Take 1 tablet by mouth daily.  . Cholecalciferol (VITAMIN D3) 125 MCG (5000 UT) CAPS Take 5,000 Units by mouth daily.  . Cinnamon 500 MG TABS Take 500 mg by mouth 3 (three) times a week.   Marland Kitchen  CITRUS BERGAMOT PO Take 1 capsule by mouth 2 (two) times a week.   . clopidogrel (PLAVIX) 75 MG tablet Take 1 tablet (75 mg total) by mouth daily. ( the initial first day take 4 tablets only)  . Coenzyme Q10 (CO Q 10) 100 MG CAPS Take 100 mg by mouth daily.   . Cyanocobalamin (VITAMIN B-12 PO) Take 1 tablet by mouth daily.  . diazepam (VALIUM) 5 MG tablet TAKE 1/2 TABLET BY MOUTH TWICE DAILY  . doxepin (SINEQUAN) 50 MG capsule TAKE 1 CAPSULE BY MOUTH AT BEDTIME AS NEEDED FOR INSOMNIA OR ANXIETY  . ezetimibe (ZETIA) 10 MG tablet Take 1 tablet (10 mg total) by mouth daily.  . Fluticasone-Salmeterol (ADVAIR) 250-50 MCG/DOSE AEPB Inhale 1 puff into the lungs once a week.   . furosemide (LASIX) 20 MG tablet TAKE 1 TABLET BY MOUTH ONCE DAILY AS NEEDED FOR FLUID RETENTION  . Grape Seed OIL Take 1 capsule by mouth daily.  Nyoka Cowden Tea, Camellia sinensis, (GREEN TEA EXTRACT PO) Take 1 capsule by mouth daily.  . nitroGLYCERIN (NITROSTAT) 0.4 MG SL tablet Place 1 tablet (0.4 mg total) under the tongue every 5 (five) minutes as needed.  . ramipril (ALTACE) 10 MG capsule TAKE 1 CAPSULE BY MOUTH EVERY DAY  . RESVERATROL 100 MG CAPS Take 100 mg by mouth daily.   . rosuvastatin (CRESTOR) 40 MG tablet Take 1 tablet (40 mg total) by mouth every Monday, Wednesday, and Friday.  . TURMERIC PO Take 1 capsule by mouth as needed.   Marland Kitchen UNABLE TO FIND Take 1 capsule by mouth See admin instructions. Swanson Mediterranean Nutrient capsules: Take 1 capsule by mouth once a day  . [DISCONTINUED] metoprolol succinate (TOPROL-XL) 50 MG 24 hr tablet TAKE 1 TABLET BY MOUTH EVERY DAY WITH FOOD OR IMMEDIATELY FOLLOWING A MEAL.    No Known Allergies  SOCIAL HISTORY/FAMILY HISTORY   Reviewed in Epic:  Pertinent findings:  Social History   Tobacco Use  . Smoking status: Former Smoker    Packs/day: 2.00    Years: 25.00    Pack years: 50.00    Types: Cigarettes    Quit date: 05/03/1973    Years since quitting: 47.3  .  Smokeless tobacco: Never Used  Vaping Use  . Vaping Use: Never used  Substance Use Topics  . Alcohol use: Yes    Alcohol/week: 4.0 standard drinks    Types: 1 Glasses of wine, 3 Cans of beer per week  . Drug use: No   Social History   Social History Narrative  . Not on file    OBJCTIVE -PE, EKG, labs   Wt Readings from Last 3 Encounters:  07/23/20 219 lb 9.6 oz (99.6 kg)  05/29/20 218 lb 9.6 oz (99.2 kg)  05/21/20 218 lb 9.6 oz (99.2 kg)    Physical Exam: BP 132/72 (BP Location: Right Arm, Patient Position: Sitting, Cuff Size: Normal)   Pulse 68   Ht 5\' 10"  (1.778 m)   Wt 219 lb 9.6 oz (99.6 kg)   SpO2 95%   BMI 31.51 kg/m  Physical Exam Vitals reviewed.  Constitutional:      General: He is not in acute distress.    Appearance: Normal appearance. He is obese. He is not ill-appearing or toxic-appearing.     Comments: Well-groomed.  Healthy-appearing.  In good spirits  HENT:     Head: Normocephalic and atraumatic.  Neck:     Vascular: No carotid bruit, hepatojugular reflux or JVD.  Cardiovascular:     Rate and Rhythm: Normal rate and regular rhythm.  No extrasystoles are present.    Chest Wall: PMI is not displaced.     Pulses: Normal pulses.     Heart sounds: Normal heart sounds. No murmur heard. No friction rub. No gallop.   Pulmonary:     Effort: Pulmonary effort is normal. No respiratory distress.     Breath sounds: Normal breath sounds.  Chest:     Chest wall: No tenderness.  Musculoskeletal:     Cervical back: Normal range of motion and neck supple.  Skin:    General: Skin is dry.     Coloration: Skin is not jaundiced.  Neurological:     General: No focal deficit present.     Mental Status: He is alert and oriented to person, place, and time.     Gait: Gait abnormal (Favors his knee).  Psychiatric:        Mood and Affect: Mood normal.        Behavior: Behavior normal.        Thought Content: Thought content normal.        Judgment: Judgment normal.      Adult ECG Report Not checked*  Recent Labs: Reviewed-not yet at goal Lab Results  Component Value Date   CHOL 137 06/27/2020   HDL 40 06/27/2020   LDLCALC 82 06/27/2020   TRIG 78 06/27/2020   CHOLHDL 3.4 06/27/2020   Lab Results  Component Value Date   CREATININE 1.05 06/27/2020   BUN 12 06/27/2020   NA 141 06/27/2020   K 4.2 06/27/2020   CL 101 06/27/2020   CO2 23 06/27/2020   CBC Latest Ref Rng & Units 01/17/2020 06/25/2019 06/24/2019  WBC 3.8 - 10.8 Thousand/uL 6.5 9.3 8.9  Hemoglobin 13.2 - 17.1 g/dL 14.6 13.2 13.0  Hematocrit 38.5 - 50.0 % 45.6 38.8(L) 38.3(L)  Platelets 140 - 400  Thousand/uL 243 183 165    Lab Results  Component Value Date   TSH 1.390 10/06/2012    ==================================================  COVID-19 Education: The signs and symptoms of COVID-19 were discussed with the patient and how to seek care for testing (follow up with PCP or arrange E-visit).   The importance of social distancing and COVID-19 vaccination was discussed today. The patient is practicing social distancing & Masking.   I spent a total of 53minutes with the patient spent in direct patient consultation.  Additional time spent with chart review  / charting (studies, outside notes, etc): 15 min Total Time: 35 min   Current medicines are reviewed at length with the patient today.  (+/- concerns) n/a  This visit occurred during the SARS-CoV-2 public health emergency.  Safety protocols were in place, including screening questions prior to the visit, additional usage of staff PPE, and extensive cleaning of exam room while observing appropriate contact time as indicated for disinfecting solutions.  Notice: This dictation was prepared with Dragon dictation along with smaller phrase technology. Any transcriptional errors that result from this process are unintentional and may not be corrected upon review.  Patient Instructions / Medication Changes & Studies & Tests Ordered    Patient Instructions  Medication Instructions:  START: ZETIA 10mg  DAILY *If you need a refill on your cardiac medications before your next appointment, please call your pharmacy*  Lab Work: CMP/LIPIDS in 4 MONTHS If you have labs (blood work) drawn today and your tests are completely normal, you will receive your results only by: Marland Kitchen MyChart Message (if you have MyChart) OR . A paper copy in the mail If you have any lab test that is abnormal or we need to change your treatment, we will call you to review the results.  Follow-Up: At [facility], you and your health needs are our priority.  As part of our continuing mission to provide you with exceptional heart care, we have created designated Provider Care Teams.  These Care Teams include your primary Cardiologist (physician) and Advanced Practice Providers (APPs -  Physician Assistants and Nurse Practitioners) who all work together to provide you with the care you need, when you need it.  We recommend signing up for the patient portal called "MyChart".  Sign up information is provided on this After Visit Summary.  MyChart is used to connect with patients for Virtual Visits (Telemedicine).  Patients are able to view lab/test results, encounter notes, upcoming appointments, etc.  Non-urgent messages can be sent to your provider as well.   To learn more about what you can do with MyChart, go to [email or web address].    Your next appointment:   6 month(s)  The format for your next appointment:   In Person  Provider:   Glenetta Hew, MD     Studies Ordered:   Orders Placed This Encounter  Procedures  . Comprehensive metabolic panel  . Lipid panel     Glenetta Hew, M.D., M.S. Interventional Cardiologist   Pager # [phone] Phone # [phone] [address] [place], [place] [address]   Thank you for choosing Heartcare at [facility]!!

## 2020-07-25 ENCOUNTER — Other Ambulatory Visit: Payer: Self-pay | Admitting: Internal Medicine

## 2020-07-25 DIAGNOSIS — I10 Essential (primary) hypertension: Secondary | ICD-10-CM

## 2020-08-09 ENCOUNTER — Encounter: Payer: Self-pay | Admitting: Cardiology

## 2020-08-09 NOTE — Assessment & Plan Note (Signed)
Severe native CAD with occlusion of the RCA, mid LAD, and OM2 with severe ostial D1, RI disease along with occluded SVG-OM2.  PCI of left main into the proximal mid LCx.  Otherwise patent LIMA-LAD RI, SVG-dRCA.  Plan  Based on the stent DES, will continue Plavix but okay to stop aspirin, since he is a year out.  At this point it is okay for him to hold Plavix as needed for surgical procedure 5 days preop  Continue moderate dose metoprolol along with ACE inhibitor.  We converted him from atorvastatin to rosuvastatin and Zetia today.

## 2020-08-09 NOTE — Assessment & Plan Note (Signed)
Blood pressures pretty well controlled today on Toprol, and ramipril which he is taking every other day. (He says it is easier to take it every other day then take a half a tablet daily)

## 2020-08-09 NOTE — Assessment & Plan Note (Signed)
Mildly reduced LVEF with expected wall motion abnormality from OM2 occlusion.  No active heart failure symptoms -> NYHA class I.  Edema seems more related to venous stasis & not CHF.  He is on Toprol and ramipril at stable doses, along with as needed furosemide. Well-controlled.  No change

## 2020-08-09 NOTE — Assessment & Plan Note (Signed)
He underwent L CEA in February 2016.  Now 6 years out with follow-up carotid Doppler showing patent OpSite.  Continue current treatment with aspirin, Plavix, beta-blocker and ACE inhibitor along with statin for lipid control.Marland Kitchen

## 2020-08-09 NOTE — Assessment & Plan Note (Signed)
3 of 4 grafts patent.  Occluded SVG-OM2, now with DES PCI of left main and LCx.  Native OM2 is also occluded.

## 2020-08-09 NOTE — Assessment & Plan Note (Signed)
No real PND orthopnea.  I do not think that his edema is related to heart failure.  He remains on furosemide that he takes just but every day but not always and rarely takes additional dose.

## 2020-08-09 NOTE — Assessment & Plan Note (Signed)
Now just about a year out from his posterior STEMI with occluded SVG-OM2.  Both the native and graft of the OM 2 are occluded but the remainder of the circumflex has been revascularized percutaneously with Left Main-LCx PCI. Does have a reduced EF 45 to 50% with Posterolateral Regional Wall Motion Normality.  No heart failure symptoms of PND, orthopnea with trivial edema.  Remains very active with no recurrent symptoms.

## 2020-08-09 NOTE — Assessment & Plan Note (Signed)
LDL still not at goal.  Currently 82 on rosuvastatin.  Plan: Add Zetia 10 mg daily, recheck labs in 4 months.

## 2020-08-12 ENCOUNTER — Other Ambulatory Visit: Payer: Self-pay | Admitting: Internal Medicine

## 2020-08-12 NOTE — Telephone Encounter (Signed)
Patient has request refill on medication "Doxepin 50mg ". Last refill on medication 05/12/2020. Medication pend and sent to PCP Wardell Honour, MD due to warnings. Please Advise.

## 2020-08-18 ENCOUNTER — Other Ambulatory Visit: Payer: Self-pay | Admitting: Cardiology

## 2020-09-16 ENCOUNTER — Other Ambulatory Visit: Payer: Self-pay | Admitting: *Deleted

## 2020-09-16 ENCOUNTER — Encounter: Payer: Medicare HMO | Admitting: Family

## 2020-09-16 MED ORDER — DIAZEPAM 5 MG PO TABS: 2.5000 mg | ORAL_TABLET | Freq: Two times a day (BID) | ORAL | 1 refills | Status: DC

## 2020-09-16 NOTE — Telephone Encounter (Signed)
Pharmacy requested refill Epic LR: 02/2020 x 5refills Pended Rx and sent to Dr. Sabra Heck for approval.

## 2020-10-16 ENCOUNTER — Ambulatory Visit (INDEPENDENT_AMBULATORY_CARE_PROVIDER_SITE_OTHER): Payer: Medicare HMO | Admitting: Family

## 2020-10-16 ENCOUNTER — Encounter: Payer: Self-pay | Admitting: Family

## 2020-10-16 ENCOUNTER — Other Ambulatory Visit: Payer: Self-pay

## 2020-10-16 DIAGNOSIS — Z0000 Encounter for general adult medical examination without abnormal findings: Secondary | ICD-10-CM

## 2020-10-16 NOTE — Patient Instructions (Signed)
Alex [name], Thank you for taking time to come for your Medicare Wellness Visit. I appreciate your ongoing commitment to your health goals. Please review the following plan we discussed and let me know if I can assist you in the future.   Screening recommendations/referrals: Colonoscopy N/A Recommended yearly ophthalmology/optometry visit for glaucoma screening and checkup Recommended yearly dental visit for hygiene and checkup  Vaccinations: Influenza vaccine Up to date  Pneumococcal vaccine Up to date  Tdap vaccine Please get Tetanus vaccine at your pharmacy  Shingles vaccine : declined    Advanced directives: No   Conditions/risks identified: Advance age male > 54 yr,Male Gender,Obesity BMI > 30, dyslipidemia,Hypertension   Next appointment: 1 year    Preventive Care 9 Years and Older, Male Preventive care refers to lifestyle choices and visits with your health care provider that can promote health and wellness. What does preventive care include? A yearly physical exam. This is also called an annual well check. Dental exams once or twice a year. Routine eye exams. Ask your health care provider how often you should have your eyes checked. Personal lifestyle choices, including: Daily care of your teeth and gums. Regular physical activity. Eating a healthy diet. Avoiding tobacco and drug use. Limiting alcohol use. Practicing safe sex. Taking low doses of aspirin every day. Taking vitamin and mineral supplements as recommended by your health care provider. What happens during an annual well check? The services and screenings done by your health care provider during your annual well check will depend on your age, overall health, lifestyle risk factors, and family history of disease. Counseling  Your health care provider may ask you questions about your: Alcohol use. Tobacco use. Drug use. Emotional well-being. Home and relationship well-being. Sexual activity. Eating  habits. History of falls. Memory and ability to understand (cognition). Work and work Statistician. Screening  You may have the following tests or measurements: Height, weight, and BMI. Blood pressure. Lipid and cholesterol levels. These may be checked every 5 years, or more frequently if you are over 23 years old. Skin check. Lung cancer screening. You may have this screening every year starting at age 59 if you have a 30-pack-year history of smoking and currently smoke or have quit within the past 15 years. Fecal occult blood test (FOBT) of the stool. You may have this test every year starting at age 26. Flexible sigmoidoscopy or colonoscopy. You may have a sigmoidoscopy every 5 years or a colonoscopy every 10 years starting at age 76. Prostate cancer screening. Recommendations will vary depending on your family history and other risks. Hepatitis C blood test. Hepatitis B blood test. Sexually transmitted disease (STD) testing. Diabetes screening. This is done by checking your blood sugar (glucose) after you have not eaten for a while (fasting). You may have this done every 1-3 years. Abdominal aortic aneurysm (AAA) screening. You may need this if you are a current or former smoker. Osteoporosis. You may be screened starting at age 78 if you are at high risk. Talk with your health care provider about your test results, treatment options, and if necessary, the need for more tests. Vaccines  Your health care provider may recommend certain vaccines, such as: Influenza vaccine. This is recommended every year. Tetanus, diphtheria, and acellular pertussis (Tdap, Td) vaccine. You may need a Td booster every 10 years. Zoster vaccine. You may need this after age 87. Pneumococcal 13-valent conjugate (PCV13) vaccine. One dose is recommended after age 25. Pneumococcal polysaccharide (PPSV23) vaccine. One dose is recommended  after age 66. Talk to your health care provider about which screenings and  vaccines you need and how often you need them. This information is not intended to replace advice given to you by your health care provider. Make sure you discuss any questions you have with your health care provider. Document Released: 05/16/2015 Document Revised: 01/07/2016 Document Reviewed: 02/18/2015 Elsevier Interactive Patient Education  2017 [place] Prevention in the Home Falls can cause injuries. They can happen to people of all ages. There are many things you can do to make your home safe and to help prevent falls. What can I do on the outside of my home? Regularly fix the edges of walkways and driveways and fix any cracks. Remove anything that might make you trip as you walk through a door, such as a raised step or threshold. Trim any bushes or trees on the path to your home. Use bright outdoor lighting. Clear any walking paths of anything that might make someone trip, such as rocks or tools. Regularly check to see if handrails are loose or broken. Make sure that both sides of any steps have handrails. Any raised decks and porches should have guardrails on the edges. Have any leaves, snow, or ice cleared regularly. Use sand or salt on walking paths during winter. Clean up any spills in your garage right away. This includes oil or grease spills. What can I do in the bathroom? Use night lights. Install grab bars by the toilet and in the tub and shower. Do not use towel bars as grab bars. Use non-skid mats or decals in the tub or shower. If you need to sit down in the shower, use a plastic, non-slip stool. Keep the floor dry. Clean up any water that spills on the floor as soon as it happens. Remove soap buildup in the tub or shower regularly. Attach bath mats securely with double-sided non-slip rug tape. Do not have throw rugs and other things on the floor that can make you trip. What can I do in the bedroom? Use night lights. Make sure that you have a light by your  bed that is easy to reach. Do not use any sheets or blankets that are too big for your bed. They should not hang down onto the floor. Have a firm chair that has side arms. You can use this for support while you get dressed. Do not have throw rugs and other things on the floor that can make you trip. What can I do in the kitchen? Clean up any spills right away. Avoid walking on wet floors. Keep items that you use a lot in easy-to-reach places. If you need to reach something above you, use a strong step stool that has a grab bar. Keep electrical cords out of the way. Do not use floor polish or wax that makes floors slippery. If you must use wax, use non-skid floor wax. Do not have throw rugs and other things on the floor that can make you trip. What can I do with my stairs? Do not leave any items on the stairs. Make sure that there are handrails on both sides of the stairs and use them. Fix handrails that are broken or loose. Make sure that handrails are as long as the stairways. Check any carpeting to make sure that it is firmly attached to the stairs. Fix any carpet that is loose or worn. Avoid having throw rugs at the top or bottom of the stairs. If you  do have throw rugs, attach them to the floor with carpet tape. Make sure that you have a light switch at the top of the stairs and the bottom of the stairs. If you do not have them, ask someone to add them for you. What else can I do to help prevent falls? Wear shoes that: Do not have high heels. Have rubber bottoms. Are comfortable and fit you well. Are closed at the toe. Do not wear sandals. If you use a stepladder: Make sure that it is fully opened. Do not climb a closed stepladder. Make sure that both sides of the stepladder are locked into place. Ask someone to hold it for you, if possible. Clearly mark and make sure that you can see: Any grab bars or handrails. First and last steps. Where the edge of each step is. Use tools that  help you move around (mobility aids) if they are needed. These include: Canes. Walkers. Scooters. Crutches. Turn on the lights when you go into a dark area. Replace any light bulbs as soon as they burn out. Set up your furniture so you have a clear path. Avoid moving your furniture around. If any of your floors are uneven, fix them. If there are any pets around you, be aware of where they are. Review your medicines with your doctor. Some medicines can make you feel dizzy. This can increase your chance of falling. Ask your doctor what other things that you can do to help prevent falls. This information is not intended to replace advice given to you by your health care provider. Make sure you discuss any questions you have with your health care provider. Document Released: 02/13/2009 Document Revised: 09/25/2015 Document Reviewed: 05/24/2014 Elsevier Interactive Patient Education  2017 [employer].

## 2020-10-16 NOTE — Progress Notes (Signed)
This service is provided via telemedicine  No vital signs collected/recorded due to the encounter was a telemedicine visit.   Location of patient (ex: home, work): Home  Patient consents to a telephone visit: Yes  Location of the provider (ex: office, home): [employer].   Name of any referring provider: Wardell Honour, MD   Names of all persons participating in the telemedicine service and their role in the encounter: Patient, Heriberto [place], [place], [place], Webb Silversmith, NP.    Time spent on call: 8 minutes spent on the phone with Medical Assistant.     Subjective:   Alex [name] is a 87 y.o. male who presents for Medicare Annual/Subsequent preventive examination.  Review of Systems     Cardiac Risk Factors include: advanced age (>55men, >11 women);male gender;obesity (BMI >30kg/m2);dyslipidemia;hypertension     Objective:    There were no vitals filed for this visit. There is no height or weight on file to calculate BMI.  Advanced Directives 10/16/2020 05/29/2020 01/21/2020 09/14/2019 07/12/2019 06/22/2019 09/12/2018  Does Patient Have a Medical Advance Directive? No Yes Yes No Yes Yes Yes  Type of Advance Directive - - - - Press photographer;Living will Living will [place];Living will;Out of facility DNR (pink MOST or yellow form)  Does patient want to make changes to medical advance directive? - No - Patient declined - - No - Patient declined No - Patient declined No - Patient declined  Copy of [place] in Chart? - - - - Yes - validated most recent copy scanned in chart (See row information) - -  Would patient like information on creating a medical advance directive? No - Patient declined - - No - Patient declined - - -    Current Medications (verified) Outpatient Encounter Medications as of 10/16/2020  Medication Sig   ascorbic acid (VITAMIN C) 500 MG tablet Take 500-1,000 mg by mouth daily.   aspirin EC 81 MG  tablet Take 1 tablet (81 mg total) by mouth daily.   b complex vitamins tablet Take 1 tablet by mouth daily.   Cholecalciferol (VITAMIN D3) 125 MCG (5000 UT) CAPS Take 5,000 Units by mouth daily.   Cinnamon 500 MG TABS Take 500 mg by mouth 3 (three) times a week.    CITRUS BERGAMOT PO Take 1 capsule by mouth 2 (two) times a week.    clopidogrel (PLAVIX) 75 MG tablet Take 1 tablet (75 mg total) by mouth daily.   Coenzyme Q10 (CO Q 10) 100 MG CAPS Take 100 mg by mouth daily.    Cyanocobalamin (VITAMIN B-12 PO) Take 1 tablet by mouth daily.   diazepam (VALIUM) 5 MG tablet Take 0.5 tablets (2.5 mg total) by mouth 2 (two) times daily.   doxepin (SINEQUAN) 50 MG capsule TAKE 1 CAPSULE BY MOUTH AT BEDTIME AS NEEDED FOR INSOMNIA OR ANXIETY   ezetimibe (ZETIA) 10 MG tablet Take 1 tablet (10 mg total) by mouth daily.   Fluticasone-Salmeterol (ADVAIR) 250-50 MCG/DOSE AEPB Inhale 1 puff into the lungs once a week.    furosemide (LASIX) 20 MG tablet TAKE 1 TABLET BY MOUTH ONCE DAILY AS NEEDED FOR FLUID RETENTION   Grape Seed OIL Take 1 capsule by mouth daily.   Green Tea, Camellia sinensis, (GREEN TEA EXTRACT PO) Take 1 capsule by mouth daily.   metoprolol succinate (TOPROL-XL) 50 MG 24 hr tablet TAKE 1 TABLET BY MOUTH EVERY DAY WITH FOOD OR IMMEDIATELY FOLLOWING A MEAL  nitroGLYCERIN (NITROSTAT) 0.4 MG SL tablet Place 1 tablet (0.4 mg total) under the tongue every 5 (five) minutes as needed.   ramipril (ALTACE) 10 MG capsule TAKE 1 CAPSULE BY MOUTH EVERY DAY   RESVERATROL 100 MG CAPS Take 100 mg by mouth daily.    TURMERIC PO Take 1 capsule by mouth as needed.    UNABLE TO FIND Take 1 capsule by mouth See admin instructions. [place]anson Mediterranean Nutrient capsules: Take 1 capsule by mouth once a day   rosuvastatin (CRESTOR) 40 MG tablet Take 1 tablet (40 mg total) by mouth every Monday, Wednesday, and Friday.   No facility-administered encounter medications on file as of 10/16/2020.    Allergies  (verified) Patient has no known allergies.   History: Past Medical History:  Diagnosis Date   Acquired deformity of nose    Calculus of kidney    Cervicalgia    Chronic heart failure with preserved ejection fraction (HFpEF) (HCC)    EF ~45-50%   Esophagitis, unspecified    Essential hypertension    Hyperlipidemia    Hypertrophy of prostate with urinary obstruction and other lower urinary tract symptoms (LUTS)    Insomnia, unspecified    Internal hemorrhoids without mention of complication    Left carotid artery stenosis    S/P L CEA - 2015 (Dr. Trula Slade)   Multiple vessel coronary artery disease 2000   ~50% LM-ostLAD with 85% ost-mid LCx, 80% ost RI, 100% CTO RCA -> CABG X 4 (LIMA-LAD, RIMA-dRCA, SVG-OM2, SVG-RI) (Dr. Roxan Hockey) => 06/2019: STEMI : 100% SVG-OM2 (unable to open), 100% OM2; DES PCI LM-mLCx.    Obesity, unspecified    Old myocardial infarction 06/2019   True posterior STEMI -> occluded SVG.  Native LM--LCX PCI   Other and unspecified hyperlipidemia    Rhinophyma 06/05/2014   Unspecified malignant neoplasm of scalp and skin of neck    Unspecified malignant neoplasm of skin of ear and external auditory canal    Unspecified vitamin D deficiency    Urinary frequency    Past Surgical History:  Procedure Laterality Date   CAROTID ENDARTERECTOMY Left 06-14-14   CE   COLONOSCOPY  2007   Dr Lajoyce Corners, normal   CORONARY ARTERY BYPASS GRAFT  2000   Dr. Roxan Hockey - CABG x 4:  LIMA-LAD, RIMA-dRCA, SVG-OM2, SVG-RI   CORONARY STENT INTERVENTION N/A 06/22/2019   Procedure: CORONARY STENT INTERVENTION;  Surgeon: Leonie Man, MD;  Location: [facility]; unsuccessful attempt at revascularization of SVG-OM2 => Synergy DES PCI from LM-mid LCx (Synergy DES 2.5 mm x 38 mm--> 3.1-2.6 mm); unfortunately OM2 was occluded.  This revascularized OM1, OM 3 and OM 4.   CORONARY/GRAFT ACUTE MI REVASCULARIZATION N/A 06/22/2019   Procedure: CORONARY/GRAFT ACUTE MI REVASCULARIZATION;   Surgeon: Leonie Man, MD;  Location: [facility];; unsuccessful PTCA/aspiration thrombectomy of SVG-OM1 as culprit lesion for STEMI   ENDARTERECTOMY Left 06/14/2014   Procedure: LEFT CAROTID ENDARTERECTOMY WITH VASCUGUARD PATCH ANGIOPLASTY;  Surgeon: Serafina Mitchell, MD;  Location: [facility] OR;  Service: Vascular;  Laterality: Left;   ESOPHAGOGASTRODUODENOSCOPY ENDOSCOPY  2007   Dr Lajoyce Corners, with biopsy   EXCISIONAL [place] CATH AND CORONARY ANGIOGRAPHY  2000   MV CAD --> CABG x 4   LEFT HEART CATH AND CORS/GRAFTS ANGIOGRAPHY N/A 06/22/2019   Procedure: LEFT HEART CATH AND CORS/GRAFTS ANGIOGRAPHY;  Surgeon: Leonie Man, MD;  CULPRIT LESION = 100% SVG-OM2; Severe native CAD: mLM-Ost LAD-50% w/ Colon Flattery  LCx 85% (DES PCI OM-OstLCx)- Treated b/c 100% TO SVG-OM2 as Culprit lesion - unable to open. OM2 100%. 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  GR 1 DD.  No obvious R WMA.  Essentially normal valves.  Mild MR.   trench in right side of jaw after abcesses  06/01/2016   Family History  Problem Relation Age of Onset   Cancer Mother        LIVER   Cancer Brother        PROSTATE   Cancer Daughter    Social History   Socioeconomic History   Marital status: Widowed    Spouse name: Not on file   Number of children: Not on file   Years of education: Not on file   Highest education level: Not on file  Occupational History   Not on file  Tobacco Use   Smoking status: Former    Packs/day: 2.00    Years: 25.00    Pack years: 50.00    Types: Cigarettes    Quit date: 05/03/1973    Years since quitting: 47.4   Smokeless tobacco:  Never  Vaping Use   Vaping Use: Never used  Substance and Sexual Activity   Alcohol use: Yes    Alcohol/week: 4.0 standard drinks    Types: 1 Glasses of wine, 3 Cans of beer per week   Drug use: No   Sexual activity: Never  Other Topics Concern   Not on file  Social History Narrative   Not on file   Social Determinants of Health   Financial Resource Strain: Not on file  Food Insecurity: Not on file  Transportation Needs: Not on file  Physical Activity: Not on file  Stress: Not on file  Social Connections: Not on file    Tobacco Counseling Counseling given: Not Answered   Clinical Intake:  Pre-visit preparation completed: No  Pain : No/denies pain     BMI - recorded: 31.37 Nutritional Status: BMI > 30  Obese Nutritional Risks: None Diabetes: No  How often do you need to have someone help you when you read instructions, pamphlets, or other written materials from your doctor or pharmacy?: 1 - Never What is the last grade level you completed in school?: 4 yres College  Diabetic?No   Interpreter Needed?: No  Information entered by :: Gabryel Files FNP-C   Activities of Daily Living In your present state of health, do you have any difficulty performing the following activities: 10/16/2020  Hearing? N  Vision? N  Difficulty concentrating or making decisions? N  Walking or climbing stairs? N  Dressing or bathing? N  Doing errands, shopping? Y  Comment son [place] and eating ? N  Using the Toilet? N  In the past six months, have you accidently leaked urine? N  Do you have problems with loss of bowel control? N  Managing your Medications? N  Managing your Finances? N  Housekeeping or managing your Housekeeping? Y  Comment son assist  Some recent data might be hidden    Patient Care Team: Wardell Honour, MD as PCP - General (  Family Medicine) Leonie Man, MD as PCP - Cardiology (Cardiology) Lorretta Harp, MD as Consulting  Physician (Cardiology) Serafina Mitchell, MD as Consulting Physician (Vascular Surgery) Shon Hough, MD as Consulting Physician (Ophthalmology) Bjorn Loser, MD as Consulting Physician (Urology) Shon Hough, MD as Consulting Physician (Ophthalmology)  Indicate any recent Medical Services you may have received from other than [facility] providers in the past year (date may be approximate).     Assessment:   This is a routine wellness examination for [place].  Hearing/Vision screen Hearing Screening - Comments:: No Hearing Concerns.  Vision Screening - Comments:: No Vision Concerns. Patient wears prescription glasses. Patient last eye exam was 2017.  Dietary issues and exercise activities discussed: Current Exercise Habits: Home exercise routine, Type of exercise: walking, Time (Minutes): 10, Frequency (Times/Week): 7, Weekly Exercise (Minutes/Week): 70, Intensity: Moderate, Exercise limited by: None identified   Goals Addressed             This Visit's Progress    Increase water intake   On track    Starting 08/30/2016 I will start drinking 3 glasses of water a day.        Depression Screen PHQ 2/9 Scores 10/16/2020 05/29/2020 01/21/2020 09/14/2019 09/12/2018 07/10/2018 01/19/2018  PHQ - 2 Score 0 0 0 0 0 0 0    Fall Risk Fall Risk  10/16/2020 05/29/2020 01/21/2020 09/14/2019 07/12/2019  Falls in the past year? 0 0 0 0 0  Number falls in past yr: 0 0 - 0 0  Injury with Fall? 0 0 - 0 0    FALL RISK PREVENTION PERTAINING TO THE HOME:  Any stairs in or around the home? No  If so, are there any without handrails? No  Home free of loose throw rugs in walkways, pet beds, electrical cords, etc? No  Adequate lighting in your home to reduce risk of falls? Yes   ASSISTIVE DEVICES UTILIZED TO PREVENT FALLS:  Life alert? No  Use of a cane, walker or w/c? No  Grab bars in the bathroom? Yes  Shower chair or bench in shower? No  Elevated toilet seat or a handicapped toilet? No    TIMED UP AND GO:  Was the test performed? No .  Length of time to ambulate 10 feet: N/A  sec.   Gait slow and steady without use of assistive device  Cognitive Function: MMSE - Mini Mental State Exam 01/21/2020 09/09/2017 08/30/2016 08/27/2015  Orientation to time 4 5 5 5   Orientation to Place 5 5 5 5   Registration 3 3 3 3   Attention/ Calculation 5 5 5 5   Recall 3 2 0 3  Language- name 2 objects 2 2 2 2   Language- repeat 1 1 1 1   Language- follow 3 step command 3 3 3 3   Language- read & follow direction 1 1 1 1   Write a sentence 1 1 1 1   Copy design 0 1 0 1  Total score 28 29 26 30      6CIT Screen 10/16/2020 09/14/2019 09/12/2018  What Year? 0 points 0 points 0 points  What month? 0 points 0 points 0 points  What time? 0 points 0 points 0 points  Count back from 20 2 points 0 points 0 points  Months in reverse 0 points 0 points 0 points  Repeat phrase 4 points 10 points 0 points  Total Score 6 10 0    Immunizations Immunization History  Administered Date(s) Administered   Fluad Quad(high Dose 65+) 01/18/2019,  01/21/2020   Influenza Whole 02/06/2013   Influenza, High Dose Seasonal PF 12/09/2015, 01/10/2017, 01/19/2018   Influenza-Unspecified 01/31/2009, 01/08/2014, 12/13/2014   Moderna Sars-Covid-2 Vaccination 07/12/2019, 08/15/2019, 03/29/2020   Pneumococcal Conjugate-13 11/08/2013   Pneumococcal-Unspecified 05/03/2004   Td 05/03/2004   Zoster, Live 07/19/2012    TDAP status: Up to date  Flu Vaccine status: Up to date  Pneumococcal vaccine status: Up to date  Covid-19 vaccine status: Information provided on how to obtain vaccines.   Qualifies for Shingles Vaccine? Yes   Zostavax completed No   Shingrix Completed?: No.    Education has been provided regarding the importance of this vaccine. Patient has been advised to call insurance company to determine out of pocket expense if they have not yet received this vaccine. Advised may also receive vaccine at local  pharmacy or Health Dept. Verbalized acceptance and understanding.  Screening Tests Health Maintenance  Topic Date Due   Zoster Vaccines- Shingrix (1 of 2) Never done   TETANUS/TDAP  05/03/2014   COVID-19 Vaccine (4 - Booster for Moderna series) 07/27/2020   INFLUENZA VACCINE  12/01/2020   PNA vac Low Risk Adult  Completed   HPV VACCINES  Aged Out    Health Maintenance  Health Maintenance Due  Topic Date Due   Zoster Vaccines- Shingrix (1 of 2) Never done   TETANUS/TDAP  05/03/2014   COVID-19 Vaccine (4 - Booster for Moderna series) 07/27/2020    Colorectal cancer screening: No longer required.   Lung Cancer Screening: (Low Dose CT Chest recommended if Age 15-80 years, 30 pack-year currently smoking OR have quit w/in 15years.) does not qualify.   Lung Cancer Screening Referral: No   Additional Screening:  Hepatitis C Screening: does not qualify; Completed No   Vision Screening: Recommended annual ophthalmology exams for early detection of glaucoma and other disorders of the eye. Is the patient up to date with their annual eye exam?  No  Who is the provider or what is the name of the office in which the patient attends annual eye exams? Could not remember the Dr.'s name  If pt is not established with a provider, would they like to be referred to a provider to establish care? No .   Dental Screening: Recommended annual dental exams for proper oral hygiene  Community Resource Referral / Chronic Care Management: CRR required this visit?  No   CCM required this visit?  No      Plan:     I have personally reviewed and noted the following in the patient's chart:   Medical and social history Use of alcohol, tobacco or illicit drugs  Current medications and supplements including opioid prescriptions. Patient is currently taking opioid prescriptions. Information provided to patient regarding non-opioid alternatives. Patient advised to discuss non-opioid treatment plan with  their provider. Functional ability and status Nutritional status Physical activity Advanced directives List of other physicians Hospitalizations, surgeries, and ER visits in previous 12 months Vitals Screenings to include cognitive, depression, and falls Referrals and appointments  In addition, I have reviewed and discussed with patient certain preventive protocols, quality metrics, and best practice recommendations. A written personalized care plan for preventive services as well as general preventive health recommendations were provided to patient.     Sandrea Hughs, NP   10/16/2020   Nurse Notes: - Advised to get Tdap vaccine at his pharmacy

## 2020-11-10 ENCOUNTER — Other Ambulatory Visit: Payer: Self-pay | Admitting: *Deleted

## 2020-11-10 MED ORDER — DOXEPIN HCL 50 MG PO CAPS: ORAL_CAPSULE | ORAL | 0 refills | Status: DC

## 2020-11-10 NOTE — Telephone Encounter (Signed)
Pharmacy requested refill

## 2020-11-11 ENCOUNTER — Encounter: Payer: Self-pay | Admitting: Family Medicine

## 2020-11-11 ENCOUNTER — Other Ambulatory Visit: Payer: Self-pay

## 2020-11-11 ENCOUNTER — Ambulatory Visit (INDEPENDENT_AMBULATORY_CARE_PROVIDER_SITE_OTHER): Payer: Medicare HMO | Admitting: Family Medicine

## 2020-11-11 VITALS — BP 138/74 | HR 70 | Temp 97.7°F | Ht 70.0 in | Wt 219.8 lb

## 2020-11-11 DIAGNOSIS — I25119 Atherosclerotic heart disease of native coronary artery with unspecified angina pectoris: Secondary | ICD-10-CM | POA: Diagnosis not present

## 2020-11-11 DIAGNOSIS — E785 Hyperlipidemia, unspecified: Secondary | ICD-10-CM | POA: Diagnosis not present

## 2020-11-11 DIAGNOSIS — I10 Essential (primary) hypertension: Secondary | ICD-10-CM

## 2020-11-11 NOTE — Progress Notes (Signed)
Provider:  Alain Honey, MD  Careteam: Patient Care Team: Wardell Honour, MD as PCP - General (Family Medicine) Leonie Man, MD as PCP - Cardiology (Cardiology) Lorretta Harp, MD as Consulting Physician (Cardiology) Serafina Mitchell, MD as Consulting Physician (Vascular Surgery) Shon Hough, MD as Consulting Physician (Ophthalmology) Bjorn Loser, MD as Consulting Physician (Urology) Shon Hough, MD as Consulting Physician (Ophthalmology)  PLACE OF SERVICE:  [facility] Directive information    No Known Allergies  Chief Complaint  Patient presents with   Medical Management of Chronic Issues    Patient presents today for a 6 month follow-up.     HPI: Patient is a 87 y.o. male 73-month follow-up for hypertension and lipids.  He has had bypass surgery as well as stenting in the past.  No recent chest pain.  He is on meds for cholesterol as well as blood pressure.  He denies any specific problems or complaints today but this is more routine follow-up.  Review of Systems:  Review of Systems  Constitutional: Negative.   HENT: Negative.    Eyes: Negative.   Respiratory: Negative.    Cardiovascular: Negative.   Gastrointestinal: Negative.   Genitourinary: Negative.   Musculoskeletal: Negative.   Neurological: Negative.   Psychiatric/Behavioral: Negative.    All other systems reviewed and are negative.  Past Medical History:  Diagnosis Date   Acquired deformity of nose    Calculus of kidney    Cervicalgia    Chronic heart failure with preserved ejection fraction (HFpEF) (HCC)    EF ~45-50%   Esophagitis, unspecified    Essential hypertension    Hyperlipidemia    Hypertrophy of prostate with urinary obstruction and other lower urinary tract symptoms (LUTS)    Insomnia, unspecified    Internal hemorrhoids without mention of complication    Left carotid artery stenosis    S/P L CEA - 2015 (Dr. Trula Slade)   Multiple vessel  coronary artery disease 2000   ~50% LM-ostLAD with 85% ost-mid LCx, 80% ost RI, 100% CTO RCA -> CABG X 4 (LIMA-LAD, RIMA-dRCA, SVG-OM2, SVG-RI) (Dr. Roxan Hockey) => 06/2019: STEMI : 100% SVG-OM2 (unable to open), 100% OM2; DES PCI LM-mLCx.    Obesity, unspecified    Old myocardial infarction 06/2019   True posterior STEMI -> occluded SVG.  Native LM--LCX PCI   Other and unspecified hyperlipidemia    Rhinophyma 06/05/2014   Unspecified malignant neoplasm of scalp and skin of neck    Unspecified malignant neoplasm of skin of ear and external auditory canal    Unspecified vitamin D deficiency    Urinary frequency    Past Surgical History:  Procedure Laterality Date   CAROTID ENDARTERECTOMY Left 06-14-14   CE   COLONOSCOPY  2007   Dr Lajoyce Corners, normal   CORONARY ARTERY BYPASS GRAFT  2000   Dr. Roxan Hockey - CABG x 4:  LIMA-LAD, RIMA-dRCA, SVG-OM2, SVG-RI   CORONARY STENT INTERVENTION N/A 06/22/2019   Procedure: CORONARY STENT INTERVENTION;  Surgeon: Leonie Man, MD;  Location: [facility]; unsuccessful attempt at revascularization of SVG-OM2 => Synergy DES PCI from LM-mid LCx (Synergy DES 2.5 mm x 38 mm--> 3.1-2.6 mm); unfortunately OM2 was occluded.  This revascularized OM1, OM 3 and OM 4.   CORONARY/GRAFT ACUTE MI REVASCULARIZATION N/A 06/22/2019   Procedure: CORONARY/GRAFT ACUTE MI REVASCULARIZATION;  Surgeon: Leonie Man, MD;  Location: [facility];; unsuccessful PTCA/aspiration thrombectomy of SVG-OM1 as culprit lesion for STEMI  ENDARTERECTOMY Left 06/14/2014   Procedure: LEFT CAROTID ENDARTERECTOMY WITH VASCUGUARD PATCH ANGIOPLASTY;  Surgeon: Serafina Mitchell, MD;  Location: [facility];  Service: Vascular;  Laterality: Left;   ESOPHAGOGASTRODUODENOSCOPY ENDOSCOPY  2007   Dr Lajoyce Corners, with biopsy   EXCISIONAL [place] CATH AND CORONARY ANGIOGRAPHY  2000   MV CAD --> CABG x 4   LEFT HEART CATH AND CORS/GRAFTS ANGIOGRAPHY N/A 06/22/2019   Procedure: LEFT  HEART CATH AND CORS/GRAFTS ANGIOGRAPHY;  Surgeon: Leonie Man, MD;  CULPRIT LESION = 100% SVG-OM2; Severe native CAD: mLM-Ost LAD-50% w/ Ost LCx 85% (DES PCI OM-OstLCx)- Treated b/c 100% TO SVG-OM2 as Culprit lesion - unable to open. OM2 100%. 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  GR 1 DD.  No obvious R WMA.  Essentially normal valves.  Mild MR.   trench in right side of jaw after abcesses  06/01/2016   Social History:   reports that he quit smoking about 47 years ago. His smoking use included cigarettes. He has a 50.00 pack-year smoking history. He has never used smokeless tobacco. He reports current alcohol use of about 4.0 standard drinks of alcohol per week. He reports that he does not use drugs.  Family History  Problem Relation Age of Onset   Cancer Mother        LIVER   Cancer Brother        PROSTATE   Cancer Daughter     Medications: Patient's Medications  New Prescriptions   No medications on file  Previous Medications   ASCORBIC ACID (VITAMIN C) 500 MG TABLET    Take 500-1,000 mg by mouth daily.   ASPIRIN EC 81 MG TABLET    Take 1 tablet (81 mg total) by mouth daily.   B COMPLEX VITAMINS TABLET    Take 1 tablet by mouth daily.   CHOLECALCIFEROL (VITAMIN D3) 125 MCG (5000 UT) CAPS    Take 5,000 Units by mouth daily.   CINNAMON 500 MG TABS    Take 500 mg by mouth 3 (three) times a week.    CITRUS BERGAMOT PO    Take 1 capsule by mouth 2 (two) times a week.    CLOPIDOGREL (PLAVIX) 75 MG TABLET    Take 1 tablet (75 mg total) by mouth daily.   COENZYME Q10 (CO Q 10) 100 MG CAPS    Take 100 mg by mouth daily.    CYANOCOBALAMIN  (VITAMIN B-12 PO)    Take 1 tablet by mouth daily.   DIAZEPAM (VALIUM) 5 MG TABLET    Take 0.5 tablets (2.5 mg total) by mouth 2 (two) times daily.   DOXEPIN (SINEQUAN) 50 MG CAPSULE    Take one capsule by mouth at bedtime as needed for insomnia or anxiety.   EZETIMIBE (ZETIA) 10 MG TABLET    Take 1 tablet (10 mg total) by mouth daily.   FLUTICASONE-SALMETEROL (ADVAIR) 250-50 MCG/DOSE AEPB    Inhale 1 puff into the lungs once a week.    FUROSEMIDE (LASIX) 20 MG TABLET    TAKE 1 TABLET BY MOUTH ONCE DAILY AS  NEEDED FOR FLUID RETENTION   GRAPE SEED OIL    Take 1 capsule by mouth daily.   GREEN TEA, CAMELLIA SINENSIS, (GREEN TEA EXTRACT PO)    Take 1 capsule by mouth daily.   METOPROLOL SUCCINATE (TOPROL-XL) 50 MG 24 HR TABLET    TAKE 1 TABLET BY MOUTH EVERY DAY WITH FOOD OR IMMEDIATELY FOLLOWING A MEAL   NITROGLYCERIN (NITROSTAT) 0.4 MG SL TABLET    Place 1 tablet (0.4 mg total) under the tongue every 5 (five) minutes as needed.   RAMIPRIL (ALTACE) 10 MG CAPSULE    TAKE 1 CAPSULE BY MOUTH EVERY DAY   RESVERATROL 100 MG CAPS    Take 100 mg by mouth daily.    ROSUVASTATIN (CRESTOR) 40 MG TABLET    Take 1 tablet (40 mg total) by mouth every Monday, Wednesday, and Friday.   TURMERIC PO    Take 1 capsule by mouth as needed.    UNABLE TO FIND    Take 1 capsule by mouth See admin instructions. Sw[employer] Mediterranean Nutrient capsules: Take 1 capsule by mouth once a day  Modified Medications   No medications on file  Discontinued Medications   No medications on file    Physical Exam:  Vitals:   11/11/20 1353  BP: 138/74  Pulse: 70  Temp: 97.7 F (36.5 C)  TempSrc: Temporal  SpO2: 97%  Weight: 219 lb 12.8 oz (99.7 kg)  Height: 5\' 10"  (1.778 m)   Body mass index is 31.54 kg/m. Wt Readings from Last 3 Encounters:  11/11/20 219 lb 12.8 oz (99.7 kg)  07/23/20 219 lb 9.6 oz (99.6 kg)  05/29/20 218 lb 9.6 oz (99.2 kg)    Physical Exam Vitals and nursing note reviewed.  Constitutional:       Appearance: Normal appearance.  HENT:     Head: Normocephalic.  Cardiovascular:     Rate and Rhythm: Normal rate and regular rhythm.  Pulmonary:     Effort: Pulmonary effort is normal.     Breath sounds: Normal breath sounds.  Abdominal:     General: Abdomen is flat. Bowel sounds are normal.     Palpations: Abdomen is soft.  Musculoskeletal:        General: Normal range of motion.  Skin:    General: Skin is warm.  Neurological:     General: No focal deficit present.     Mental Status: He is alert and oriented to person, place, and time.  Psychiatric:        Mood and Affect: Mood normal.        Behavior: Behavior normal.    Labs reviewed: Basic Metabolic Panel: Recent Labs    01/17/20 0855 05/26/20 0925 06/27/20 0931  NA 140 139 141  K 3.9 4.1 4.2  CL 104 105 101  CO2 28 28 23   GLUCOSE 93 89 93  BUN 16 17 12   CREATININE 1.14* 1.21* 1.05  CALCIUM 9.2 8.8 9.3   Liver Function Tests: Recent Labs    01/17/20 0855 06/27/20 0931  AST 13 18  ALT 12 16  ALKPHOS  --  49  BILITOT 0.5 0.5  PROT 6.9 7.0  ALBUMIN  --  4.3   No results for input(s): LIPASE, AMYLASE in the last 8760 hours. No results for input(s): AMMONIA in the last 8760 hours. CBC: Recent Labs    01/17/20 0855  WBC 6.5  NEUTROABS 3,569  HGB 14.6  HCT 45.6  MCV 101.1*  PLT 243   Lipid  Panel: Recent Labs    01/17/20 0855 05/26/20 0925 06/27/20 0932  CHOL 225* 120 137  HDL 44 38* 40  LDLCALC 162* 66 82  TRIG 87 78 78  CHOLHDL 5.1* 3.2 3.4   TSH: No results for input(s): TSH in the last 8760 hours. A1C: Lab Results  Component Value Date   HGBA1C 5.7 (H) 05/26/2020     Assessment/Plan  1. Hyperlipidemia with target LDL less than 70 At lipids done at cardiology office back in February.  LDL was almost at goal at 82  2. Essential hypertension Blood pressure today is 138/74 on metoprolol and ramipril  3. Coronary artery disease involving native coronary artery of native heart  with angina pectoris (HCC) No recent symptoms.  He continues on Plavix as well as as needed nitroglycerin   Alain Honey, MD [place] [phone]

## 2020-11-11 NOTE — Patient Instructions (Signed)
Continue same medication as before

## 2020-11-25 ENCOUNTER — Ambulatory Visit: Payer: Medicare HMO | Admitting: Family Medicine

## 2020-11-27 ENCOUNTER — Ambulatory Visit: Payer: Medicare HMO | Admitting: Internal Medicine

## 2021-01-02 ENCOUNTER — Other Ambulatory Visit: Payer: Self-pay | Admitting: *Deleted

## 2021-01-02 MED ORDER — DIAZEPAM 5 MG PO TABS: 2.5000 mg | ORAL_TABLET | Freq: Two times a day (BID) | ORAL | 0 refills | Status: DC

## 2021-01-02 NOTE — Telephone Encounter (Signed)
Pharmacy requested refill Epic LR: 09/16/2020 Pended Rx and sent to [facility] for approval (Dr. Sabra Heck out of office)

## 2021-01-07 ENCOUNTER — Other Ambulatory Visit: Payer: Self-pay | Admitting: *Deleted

## 2021-01-07 DIAGNOSIS — I10 Essential (primary) hypertension: Secondary | ICD-10-CM

## 2021-01-07 MED ORDER — RAMIPRIL 10 MG PO CAPS: ORAL_CAPSULE | ORAL | 1 refills | Status: DC

## 2021-01-07 NOTE — Telephone Encounter (Signed)
[facility] requested refill.  °

## 2021-01-20 ENCOUNTER — Other Ambulatory Visit: Payer: Self-pay | Admitting: *Deleted

## 2021-01-20 DIAGNOSIS — I10 Essential (primary) hypertension: Secondary | ICD-10-CM

## 2021-01-20 MED ORDER — METOPROLOL SUCCINATE ER 50 MG PO TB24: ORAL_TABLET | ORAL | 1 refills | Status: DC

## 2021-01-20 NOTE — Telephone Encounter (Signed)
[facility]

## 2021-02-10 ENCOUNTER — Other Ambulatory Visit: Payer: Self-pay | Admitting: *Deleted

## 2021-02-10 MED ORDER — DOXEPIN HCL 50 MG PO CAPS: ORAL_CAPSULE | ORAL | 0 refills | Status: DC

## 2021-02-10 NOTE — Telephone Encounter (Signed)
[facility] requested refill.

## 2021-02-13 ENCOUNTER — Other Ambulatory Visit: Payer: Self-pay | Admitting: Nurse Practitioner

## 2021-02-13 NOTE — Telephone Encounter (Signed)
Patient has request refill for medication "Valium". Patient last refill was 01/02/2021. Patient doesn't have contract for medication. Sign contract added to patient notes for next appointment. Patient medication pend and sent to Marlowe Sax, NP due to PCP Sabra Heck Lillette Boxer, MD being out of office. Please Advise.

## 2021-02-26 NOTE — Telephone Encounter (Signed)
It looks like [facility]ah approved this medication a week ago

## 2021-02-26 NOTE — Addendum Note (Signed)
Addended by: Rafael Bihari A on: 02/26/2021 02:34 PM   Modules accepted: Orders

## 2021-02-26 NOTE — Telephone Encounter (Signed)
RePended Rx because received a [facility] error   diazepam (VALIUM) 5 MG tablet 90 tablet 1 02/13/2021    Sig: TAKE 1/2 TABLET(2.5 MG) BY MOUTH TWICE DAILY   Sent to pharmacy as: diazepam (VALIUM) 5 MG tablet   E-Prescribing Status: Transmission to pharmacy failed (02/25/2021 10:19 AM EDT)

## 2021-04-13 ENCOUNTER — Other Ambulatory Visit: Payer: Self-pay | Admitting: Cardiology

## 2021-05-06 ENCOUNTER — Other Ambulatory Visit: Payer: Self-pay | Admitting: *Deleted

## 2021-05-06 MED ORDER — DOXEPIN HCL 50 MG PO CAPS: ORAL_CAPSULE | ORAL | 0 refills | Status: DC

## 2021-05-06 NOTE — Telephone Encounter (Signed)
Pharmacy Requested refill.  

## 2021-05-19 ENCOUNTER — Other Ambulatory Visit: Payer: Self-pay

## 2021-05-19 ENCOUNTER — Ambulatory Visit (INDEPENDENT_AMBULATORY_CARE_PROVIDER_SITE_OTHER): Payer: Medicare HMO | Admitting: Family Medicine

## 2021-05-19 ENCOUNTER — Encounter: Payer: Self-pay | Admitting: Family Medicine

## 2021-05-19 VITALS — BP 132/82 | HR 82 | Temp 97.1°F | Ht 70.0 in | Wt 223.4 lb

## 2021-05-19 DIAGNOSIS — I10 Essential (primary) hypertension: Secondary | ICD-10-CM

## 2021-05-19 DIAGNOSIS — I25119 Atherosclerotic heart disease of native coronary artery with unspecified angina pectoris: Secondary | ICD-10-CM | POA: Diagnosis not present

## 2021-05-19 DIAGNOSIS — G5691 Unspecified mononeuropathy of right upper limb: Secondary | ICD-10-CM

## 2021-05-19 DIAGNOSIS — E785 Hyperlipidemia, unspecified: Secondary | ICD-10-CM

## 2021-05-19 LAB — COMPLETE METABOLIC PANEL WITH GFR
AG Ratio: 1.8 (calc) (ref 1.0–2.5)
ALT: 16 U/L (ref 9–46)
AST: 20 U/L (ref 10–35)
Albumin: 4.4 g/dL (ref 3.6–5.1)
Alkaline phosphatase (APISO): 36 U/L (ref 35–144)
BUN: 13 mg/dL (ref 7–25)
CO2: 30 mmol/L (ref 20–32)
Calcium: 9.5 mg/dL (ref 8.6–10.3)
Chloride: 104 mmol/L (ref 98–110)
Creat: 1.01 mg/dL (ref 0.70–1.22)
Globulin: 2.5 g/dL (calc) (ref 1.9–3.7)
Glucose, Bld: 93 mg/dL (ref 65–99)
Potassium: 4.1 mmol/L (ref 3.5–5.3)
Sodium: 139 mmol/L (ref 135–146)
Total Bilirubin: 0.6 mg/dL (ref 0.2–1.2)
Total Protein: 6.9 g/dL (ref 6.1–8.1)
eGFR: 72 mL/min/{1.73_m2} (ref >=60)

## 2021-05-19 LAB — LIPID PANEL
Cholesterol: 135 mg/dL (ref <200)
HDL: 36 mg/dL — ABNORMAL LOW (ref >=40)
LDL Cholesterol (Calc): 70 mg/dL (calc)
Non-HDL Cholesterol (Calc): 99 mg/dL (calc) (ref <130)
Total CHOL/HDL Ratio: 3.8 (calc) (ref <5.0)
Triglycerides: 235 mg/dL — ABNORMAL HIGH (ref <150)

## 2021-05-19 NOTE — Progress Notes (Signed)
Provider:  Alain Honey, MD  Careteam: Patient Care Team: Wardell Honour, MD as PCP - General (Family Medicine) Leonie Man, MD as PCP - Cardiology (Cardiology) Lorretta Harp, MD as Consulting Physician (Cardiology) Serafina Mitchell, MD as Consulting Physician (Vascular Surgery) Shon Hough, MD as Consulting Physician (Ophthalmology) Bjorn Loser, MD as Consulting Physician (Urology) Shon Hough, MD as Consulting Physician (Ophthalmology)  PLACE OF SERVICE:  [facility] Directive information    No Known Allergies  Chief Complaint  Patient presents with   Medical Management of Chronic Issues    Patient presents today for a 6 month follow-up.     HPI: Patient is a 88 y.o. Alex [name] patient is here for 62-month follow-up.  He is also followed by cardiology having had a STEMI in April 22.  He is on Plavix as well as Zetia and rosuvastatin to control his lipids.  He is asymptomatic as far as heart today He is more concerned today about numbness and has right hand but more specifically in his thumb and forefinger.  There is a past history of cervical disc disease.  Patient is concerned that he has diabetes since he has the numbness but there is no history of diabetes in him.  He brings in a list of about 8 or 10 herbal medications which he just started to take for his neuropathy in the right hand.  Review of Systems:  Review of Systems  Constitutional: Negative.   Respiratory: Negative.    Cardiovascular: Negative.   Musculoskeletal: Negative.   Neurological:  Positive for sensory change.       Right thumb and forefinger  Psychiatric/Behavioral: Negative.    All other systems reviewed and are negative.  Past Medical History:  Diagnosis Date   Acquired deformity of nose    Calculus of kidney    Cervicalgia    Chronic heart failure with preserved ejection fraction (HFpEF) (HCC)    EF ~45-50%   Esophagitis, unspecified    Essential  hypertension    Hyperlipidemia    Hypertrophy of prostate with urinary obstruction and other lower urinary tract symptoms (LUTS)    Insomnia, unspecified    Internal hemorrhoids without mention of complication    Left carotid artery stenosis    S/P L CEA - 2015 (Dr. Trula Slade)   Multiple vessel coronary artery disease 2000   ~50% LM-ostLAD with 85% ost-mid LCx, 80% ost RI, 100% CTO RCA -> CABG X 4 (LIMA-LAD, RIMA-dRCA, SVG-OM2, SVG-RI) (Dr. Roxan Hockey) => 06/2019: STEMI : 100% SVG-OM2 (unable to open), 100% OM2; DES PCI LM-mLCx.    Obesity, unspecified    Old myocardial infarction 06/2019   True posterior STEMI -> occluded SVG.  Native LM--LCX PCI   Other and unspecified hyperlipidemia    Rhinophyma 06/05/2014   Unspecified malignant neoplasm of scalp and skin of neck    Unspecified malignant neoplasm of skin of ear and external auditory canal    Unspecified vitamin D deficiency    Urinary frequency    Past Surgical History:  Procedure Laterality Date   CAROTID ENDARTERECTOMY Left 06-14-14   CE   COLONOSCOPY  2007   Dr Lajoyce Corners, normal   CORONARY ARTERY BYPASS GRAFT  2000   Dr. Roxan Hockey - CABG x 4:  LIMA-LAD, RIMA-dRCA, SVG-OM2, SVG-RI   CORONARY STENT INTERVENTION N/A 06/22/2019   Procedure: CORONARY STENT INTERVENTION;  Surgeon: Leonie Man, MD;  Location: [place] [facility]; unsuccessful attempt at revascularization of SVG-OM2 => Synergy  DES PCI from LM-mid LCx (Synergy DES 2.5 mm x 38 mm--> 3.1-2.6 mm); unfortunately OM2 was occluded.  This revascularized OM1, OM 3 and OM 4.   CORONARY/GRAFT ACUTE MI REVASCULARIZATION N/A 06/22/2019   Procedure: CORONARY/GRAFT ACUTE MI REVASCULARIZATION;  Surgeon: Leonie Man, MD;  Location: [place];; unsuccessful PTCA/aspiration thrombectomy of SVG-OM1 as culprit lesion for STEMI   ENDARTERECTOMY Left 06/14/2014   Procedure: LEFT CAROTID ENDARTERECTOMY WITH VASCUGUARD PATCH ANGIOPLASTY;  Surgeon: Serafina Mitchell, MD;  Location: [facility];   Service: Vascular;  Laterality: Left;   ESOPHAGOGASTRODUODENOSCOPY ENDOSCOPY  2007   Dr Lajoyce Corners, with biopsy   EXCISIONAL [place] CATH AND CORONARY ANGIOGRAPHY  2000   MV CAD --> CABG x 4   LEFT HEART CATH AND CORS/GRAFTS ANGIOGRAPHY N/A 06/22/2019   Procedure: LEFT HEART CATH AND CORS/GRAFTS ANGIOGRAPHY;  Surgeon: Leonie Man, MD;  CULPRIT LESION = 100% SVG-OM2; Severe native CAD: mLM-Ost LAD-50% w/ Ost LCx 85% (DES PCI OM-OstLCx)- Treated b/c 100% TO SVG-OM2 as Culprit lesion - unable to open. OM2 100%. 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  GR 1 DD.  No obvious R WMA.  Essentially normal valves.  Mild MR.   trench in right side of jaw after abcesses  06/01/2016   Social History:   reports that he quit smoking about 48 years ago. His smoking use included cigarettes. He has a 50.00 pack-year smoking history. He has never used smokeless tobacco. He reports current alcohol use of about 4.0 standard drinks per week. He reports that he does not use drugs.  Family History  Problem Relation Age of Onset   Cancer Mother        LIVER   Cancer Brother        PROSTATE   Cancer Daughter     Medications: Patient's Medications  New Prescriptions   No medications on file  Previous Medications   ALPHA-LIPOIC ACID 200 MG CAPS    Take 200 mg by mouth.   ASCORBIC ACID (VITAMIN C) 500 MG TABLET    Take 500-1,000 mg by mouth daily.   B COMPLEX VITAMINS TABLET    Take 1 tablet by mouth daily.   BOSWELLIA SERRATA (BOSWELLIA PO)    Take 800 mg by mouth.   CHOLECALCIFEROL (VITAMIN D3) 125 MCG (5000 UT) CAPS    Take 5,000 Units by mouth  daily.   CINNAMON 500 MG TABS    Take 500 mg by mouth 3 (three) times a week.    CITRUS BERGAMOT PO    Take 1 capsule by mouth 2 (two) times a week.    CLOPIDOGREL (PLAVIX) 75 MG TABLET    Take 1 tablet (75 mg total) by mouth daily.   COENZYME Q10 (CO Q 10) 100 MG CAPS    Take 100 mg by mouth daily.    CYANOCOBALAMIN (VITAMIN B-12 PO)    Take 1 tablet by mouth daily.   DIAZEPAM (VALIUM) 5 MG TABLET    TAKE 1/2 TABLET(2.5 MG) BY MOUTH TWICE DAILY   DOXEPIN (SINEQUAN) 50 MG CAPSULE  Take one capsule by mouth at bedtime as needed for insomnia or anxiety.   EZETIMIBE (ZETIA) 10 MG TABLET    Take 1 tablet (10 mg total) by mouth daily.   FLUTICASONE-SALMETEROL (ADVAIR) 250-50 MCG/DOSE AEPB    Inhale 1 puff into the lungs once a week.    GRAPE SEED OIL    Take 1 capsule by mouth daily.   GREEN TEA, CAMELLIA SINENSIS, (GREEN TEA EXTRACT PO)    Take 1 capsule by mouth daily.   METOPROLOL SUCCINATE (TOPROL-XL) 50 MG 24 HR TABLET    Take one tablet by mouth once daily. Take with or immediately following a meal.   NITROGLYCERIN (NITROSTAT) 0.4 MG SL TABLET    Place 1 tablet (0.4 mg total) under the tongue every 5 (five) minutes as needed.   RAMIPRIL (ALTACE) 10 MG CAPSULE    TAKE 1 CAPSULE BY MOUTH EVERY DAY   RESVERATROL 100 MG CAPS    Take 100 mg by mouth daily.    ROSUVASTATIN (CRESTOR) 40 MG TABLET    TAKE 1 TABLET BY MOUTH EVERY MONDAY, WEDNESDAY, AND FRIDAY   TURMERIC PO    Take 1 capsule by mouth as needed.    UNABLE TO FIND    Take 1 capsule by mouth See admin instructions. Sw[employer] Mediterranean Nutrient capsules: Take 1 capsule by mouth once a day   ZINC GLUCONATE 50 MG TABLET    Take 50 mg by mouth daily.  Modified Medications   No medications on file  Discontinued Medications   ASPIRIN EC 81 MG TABLET    Take 1 tablet (81 mg total) by mouth daily.   FUROSEMIDE (LASIX) 20 MG TABLET    TAKE 1 TABLET BY MOUTH ONCE DAILY AS NEEDED FOR FLUID RETENTION    Physical Exam:  Vitals:   05/19/21  1316  Weight: 223 lb 6.4 oz (101.3 kg)  Height: 5\' 10"  (1.778 m)   Body mass index is 32.05 kg/m. Wt Readings from Last 3 Encounters:  05/19/21 223 lb 6.4 oz (101.3 kg)  11/11/20 219 lb 12.8 oz (99.7 kg)  07/23/20 219 lb 9.6 oz (99.6 kg)    Physical Exam Vitals and nursing note reviewed.  Constitutional:      Appearance: Normal appearance.  Cardiovascular:     Rate and Rhythm: Normal rate and regular rhythm.  Pulmonary:     Effort: Pulmonary effort is normal.     Breath sounds: Normal breath sounds.  Skin:    Findings: Bruising present.  Neurological:     General: No focal deficit present.     Mental Status: He is alert and oriented to person, place, and time.     Comments: Decreased sensation with monofilament testing on the thumb and forefinger    Labs reviewed: Basic Metabolic Panel: Recent Labs    05/26/20 0925 06/27/20 0931  NA 139 141  K 4.1 4.2  CL 105 101  CO2 28 23  GLUCOSE 89 93  BUN 17 12  CREATININE 1.21* 1.05  CALCIUM 8.8 9.3   Liver Function Tests: Recent Labs    06/27/20 0931  AST 18  ALT 16  ALKPHOS 49  BILITOT 0.5  PROT 7.0  ALBUMIN 4.3   No results for input(s): LIPASE, AMYLASE in the last 8760 hours. No results for input(s): AMMONIA in the last 8760 hours. CBC: No results for input(s): WBC, NEUTROABS, HGB, HCT, MCV, PLT in the last 8760 hours. Lipid Panel: Recent Labs    05/26/20 0925 06/27/20 0932  CHOL 120 137  HDL 38* 40  LDLCALC 66 82  TRIG 78 78  CHOLHDL 3.2 3.4   TSH: No results for input(s): TSH in the last 8760 hours. A1C: Lab Results  Component Value Date   HGBA1C 5.7 (H) 05/26/2020     Assessment/Plan  1. Hyperlipidemia with target LDL less than 70 Now taking rosuvastatin daily.  LDL has not been at goal when last tested  2. Essential hypertension Blood pressure is good at 132/82 on metoprolol and ramipril  3. Coronary artery disease involving native coronary artery of native heart with angina pectoris  (HCC) Asymptomatic.  Continue on Plavix and as needed nitroglycerin  4. Neuropathy of finger of right hand I suspect this may be related to cervical disc disease.  I will let him try his herbal remedies for 2 to 4 months.  If symptoms are not improved consider MRI of C-spine to rule out disc disease and surgically correctable etiology   Alain Honey, MD [facility] [phone]

## 2021-05-22 ENCOUNTER — Telehealth: Payer: Self-pay

## 2021-05-22 ENCOUNTER — Ambulatory Visit (INDEPENDENT_AMBULATORY_CARE_PROVIDER_SITE_OTHER): Payer: Medicare HMO | Admitting: Adult Health

## 2021-05-22 ENCOUNTER — Encounter: Payer: Self-pay | Admitting: Adult Health

## 2021-05-22 ENCOUNTER — Other Ambulatory Visit: Payer: Self-pay

## 2021-05-22 VITALS — Temp 100.0°F

## 2021-05-22 DIAGNOSIS — U071 COVID-19: Secondary | ICD-10-CM | POA: Diagnosis not present

## 2021-05-22 MED ORDER — MOLNUPIRAVIR EUA 200MG CAPSULE: 4.0000 | ORAL_CAPSULE | Freq: Two times a day (BID) | ORAL | 0 refills | Status: AC

## 2021-05-22 NOTE — Progress Notes (Signed)
This service is provided via telemedicine  No vital signs collected/recorded due to the encounter was a telemedicine visit.   Location of patient (ex: home, work):  Home  Patient consents to a telephone visit:  Yes  Location of the provider (ex: office, home):  [facility]  Name of any referring provider:  Alain Honey, MD  Names of all persons participating in the telemedicine service and their role in the encounter:  Patient, Draylen Lobue and son in background.  Time spent on call:  9 mins       DATE:  05/22/2021 MRN:  [id number]  BIRTHDAY: 1932/04/02   Contact Information     Name Relation Home Work Mobile   Roel,Jack Son [phone]     Dontrez, Pettis [phone]     Murrell Converse Daughter   [phone]        Code Status History     Date Active Date Inactive Code Status Order ID Comments User Context   06/22/2019 1057 06/25/2019 1833 Full Code [id number]  Leonie Man, MD Inpatient   06/14/2014 1757 06/16/2014 0507 Full Code [id number]  Ulyses Amor, PA-C Inpatient        Chief Complaint  Patient presents with   Acute Visit    Patient's son did at home covid test which was positive. Temp 100, nasal drainage, coughing every 10 mins, fatigue, low appetite, decreased smell.     HISTORY OF PRESENT ILLNESS:   This is an 88 year old male who had a telephone visit. He was seen in the office 3 days ago and was then feeling well. The next day, he started having fever of 100 F, productive cough, poor appetite, sore throat and not able to smell.  He denies having diarrhea. He lives with his son who is currently recovering from COVID-48. He tested positive for COVID-19 using home kit this morning. He currently takes Vitamin D, Zinc and Vitamin C.   PAST MEDICAL HISTORY:  Past Medical History:  Diagnosis Date   Acquired deformity of nose    Calculus of kidney    Cervicalgia    Chronic heart failure with preserved ejection  fraction (HFpEF) (HCC)    EF ~45-50%   Esophagitis, unspecified    Essential hypertension    Hyperlipidemia    Hypertrophy of prostate with urinary obstruction and other lower urinary tract symptoms (LUTS)    Insomnia, unspecified    Internal hemorrhoids without mention of complication    Left carotid artery stenosis    S/P L CEA - 2015 (Dr. Trula Slade)   Multiple vessel coronary artery disease 2000   ~50% LM-ostLAD with 85% ost-mid LCx, 80% ost RI, 100% CTO RCA -> CABG X 4 (LIMA-LAD, RIMA-dRCA, SVG-OM2, SVG-RI) (Dr. Roxan Hockey) => 06/2019: STEMI : 100% SVG-OM2 (unable to open), 100% OM2; DES PCI LM-mLCx.    Obesity, unspecified    Old myocardial infarction 06/2019   True posterior STEMI -> occluded SVG.  Native LM--LCX PCI   Other and unspecified hyperlipidemia    Rhinophyma 06/05/2014   Unspecified malignant neoplasm of scalp and skin of neck    Unspecified malignant neoplasm of skin of ear and external auditory canal    Unspecified vitamin D deficiency    Urinary frequency      CURRENT MEDICATIONS: Reviewed  Patient's Medications  New Prescriptions   No medications on file  Previous Medications   ALPHA-LIPOIC ACID 200 MG CAPS    Take 200 mg by mouth.   ASCORBIC ACID (  VITAMIN C) 500 MG TABLET    Take 500-1,000 mg by mouth daily.   B COMPLEX VITAMINS TABLET    Take 1 tablet by mouth daily.   BOSWELLIA SERRATA (BOSWELLIA PO)    Take 800 mg by mouth.   CHOLECALCIFEROL (VITAMIN D3) 125 MCG (5000 UT) CAPS    Take 5,000 Units by mouth daily.   CINNAMON 500 MG TABS    Take 500 mg by mouth 3 (three) times a week.    CITRUS BERGAMOT PO    Take 1 capsule by mouth 2 (two) times a week.    CLOPIDOGREL (PLAVIX) 75 MG TABLET    Take 1 tablet (75 mg total) by mouth daily.   COENZYME Q10 (CO Q 10) 100 MG CAPS    Take 100 mg by mouth daily.    CYANOCOBALAMIN (VITAMIN B-12 PO)    Take 1 tablet by mouth daily.   DIAZEPAM (VALIUM) 5 MG TABLET    TAKE 1/2 TABLET(2.5 MG) BY MOUTH TWICE DAILY   DOXEPIN  (SINEQUAN) 50 MG CAPSULE    Take one capsule by mouth at bedtime as needed for insomnia or anxiety.   EZETIMIBE (ZETIA) 10 MG TABLET    Take 1 tablet (10 mg total) by mouth daily.   FLUTICASONE-SALMETEROL (ADVAIR) 250-50 MCG/DOSE AEPB    Inhale 1 puff into the lungs once a week.    GRAPE SEED OIL    Take 1 capsule by mouth daily.   GREEN TEA, CAMELLIA SINENSIS, (GREEN TEA EXTRACT PO)    Take 1 capsule by mouth daily.   METOPROLOL SUCCINATE (TOPROL-XL) 50 MG 24 HR TABLET    Take one tablet by mouth once daily. Take with or immediately following a meal.   NITROGLYCERIN (NITROSTAT) 0.4 MG SL TABLET    Place 1 tablet (0.4 mg total) under the tongue every 5 (five) minutes as needed.   RAMIPRIL (ALTACE) 10 MG CAPSULE    TAKE 1 CAPSULE BY MOUTH EVERY DAY   RESVERATROL 100 MG CAPS    Take 100 mg by mouth daily.    ROSUVASTATIN (CRESTOR) 40 MG TABLET    TAKE 1 TABLET BY MOUTH EVERY MONDAY, WEDNESDAY, AND FRIDAY   TURMERIC PO    Take 1 capsule by mouth as needed.    UNABLE TO FIND    Take 1 capsule by mouth See admin instructions. Swanson Mediterranean Nutrient capsules: Take 1 capsule by mouth once a day   ZINC GLUCONATE 50 MG TABLET    Take 50 mg by mouth daily.  Modified Medications   No medications on file  Discontinued Medications   No medications on file     No Known Allergies   REVIEW OF SYSTEMS:  GENERAL: no appetite, + fever and chills, no appetite  SKIN: Denies rash, itching, wounds, ulcer sores, or nail abnormality MOUTH and THROAT: +sore throat RESPIRATORY: +cough, no SOB, DOE, wheezing, hemoptysis CARDIAC: no chest pain, edema or palpitations GI: no abdominal pain, diarrhea, constipation, heart burn, nausea or vomiting GU: Denies dysuria, frequency, hematuria, incontinence, or discharge MUSCULOSKELETAL: Denies joit pain, muscle pain, back pain, NEUROLOGICAL: Denies dizziness, syncope, numbness, or headache PSYCHIATRIC: Denies feeling of depression or anxiety. No report of  hallucinations, insomnia, paranoia, or agitation   LABS/RADIOLOGY: Labs reviewed: Basic Metabolic Panel: Recent Labs    05/26/20 0925 06/27/20 0931 05/19/21 1426  NA 139 141 139  K 4.1 4.2 4.1  CL 105 101 104  CO2 '28 23 30  ' GLUCOSE 89 93 93  BUN  '17 12 13  ' CREATININE 1.21* 1.05 1.01  CALCIUM 8.8 9.3 9.5   Liver Function Tests: Recent Labs    06/27/20 0931 05/19/21 1426  AST 18 20  ALT 16 16  ALKPHOS 49  --   BILITOT 0.5 0.6  PROT 7.0 6.9  ALBUMIN 4.3  --     Lipid Panel: Recent Labs    05/26/20 0925 06/27/20 0932 05/19/21 1426  HDL 38* 40 36*    ASSESSMENT/PLAN:  1. COVID-19 virus infection -  tested positive for COVID-19 home kit test - molnupiravir EUA (LAGEVRIO) 200 mg CAPS capsule; Take 4 capsules (800 mg total) by mouth 2 (two) times daily for 5 days.  Dispense: 40 capsule; Refill: 0 -  continue Zinc, Vitamin D and Vitamin C     Time spent on non face to face visit:  11 minutes  The patient gave consent to this telephone visit. Explained to the patient the risk and privacy issue that was involved with this telephone call.   The patient was advised to call back and ask for an in-person evaluation if the symptoms worsen or if the condition fails to improve.   Durenda Age, NP [employer] [phone]

## 2021-05-22 NOTE — Patient Instructions (Signed)

## 2021-05-22 NOTE — Telephone Encounter (Signed)
I connected with  [name] on 05/22/21 by a video enabled telemedicine application and verified that I am speaking with the correct person using two identifiers.   I discussed the limitations of evaluation and management by telemedicine. The patient expressed understanding and agreed to proceed.

## 2021-07-13 ENCOUNTER — Other Ambulatory Visit: Payer: Self-pay | Admitting: *Deleted

## 2021-07-13 DIAGNOSIS — I10 Essential (primary) hypertension: Secondary | ICD-10-CM

## 2021-07-13 MED ORDER — RAMIPRIL 10 MG PO CAPS: ORAL_CAPSULE | ORAL | 1 refills | Status: DC

## 2021-07-13 NOTE — Telephone Encounter (Signed)
Pharmacy requested refill

## 2021-07-27 ENCOUNTER — Other Ambulatory Visit: Payer: Self-pay | Admitting: *Deleted

## 2021-07-27 DIAGNOSIS — I10 Essential (primary) hypertension: Secondary | ICD-10-CM

## 2021-07-27 MED ORDER — METOPROLOL SUCCINATE ER 50 MG PO TB24: ORAL_TABLET | ORAL | 1 refills | Status: DC

## 2021-07-27 NOTE — Telephone Encounter (Signed)
[facility] Requested refill.  ?

## 2021-08-05 ENCOUNTER — Other Ambulatory Visit: Payer: Self-pay | Admitting: *Deleted

## 2021-08-05 MED ORDER — DOXEPIN HCL 50 MG PO CAPS: ORAL_CAPSULE | ORAL | 1 refills | Status: DC

## 2021-08-05 NOTE — Telephone Encounter (Signed)
Pharmacy requested refill

## 2021-08-11 ENCOUNTER — Other Ambulatory Visit: Payer: Self-pay | Admitting: Cardiology

## 2021-09-21 ENCOUNTER — Other Ambulatory Visit: Payer: Self-pay | Admitting: Family

## 2021-09-21 NOTE — Addendum Note (Signed)
Addended by: Bing Matter A on: 09/21/2021 10:13 AM   Modules accepted: Orders

## 2021-09-21 NOTE — Telephone Encounter (Signed)
Rx last refill (02/21/21), 90/1. No treatment agreement one file. Upcoming appointment on (10/13/21).   Please advise.

## 2021-09-22 ENCOUNTER — Ambulatory Visit: Payer: Medicare HMO | Admitting: Family Medicine

## 2021-09-23 ENCOUNTER — Other Ambulatory Visit: Payer: Self-pay

## 2021-09-23 MED ORDER — DIAZEPAM 5 MG PO TABS: ORAL_TABLET | ORAL | 1 refills | Status: DC

## 2021-09-23 NOTE — Telephone Encounter (Signed)
Patient requested prescription on 09/21/2021 the one that was requested was on print and not sent to his pharmacy  Pended prescription and sent to doctor [facility] pending approval

## 2021-10-13 ENCOUNTER — Encounter: Payer: Self-pay | Admitting: Family Medicine

## 2021-10-13 ENCOUNTER — Ambulatory Visit (INDEPENDENT_AMBULATORY_CARE_PROVIDER_SITE_OTHER): Payer: Medicare HMO | Admitting: Family Medicine

## 2021-10-13 ENCOUNTER — Encounter: Payer: Self-pay | Admitting: Family

## 2021-10-13 VITALS — BP 134/78 | HR 73 | Temp 96.9°F | Ht 70.0 in | Wt 227.4 lb

## 2021-10-13 DIAGNOSIS — R600 Localized edema: Secondary | ICD-10-CM

## 2021-10-13 DIAGNOSIS — E785 Hyperlipidemia, unspecified: Secondary | ICD-10-CM | POA: Diagnosis not present

## 2021-10-13 DIAGNOSIS — I10 Essential (primary) hypertension: Secondary | ICD-10-CM | POA: Diagnosis not present

## 2021-10-13 DIAGNOSIS — I25119 Atherosclerotic heart disease of native coronary artery with unspecified angina pectoris: Secondary | ICD-10-CM | POA: Diagnosis not present

## 2021-10-13 MED ORDER — DOXEPIN HCL 75 MG PO CAPS
ORAL_CAPSULE | ORAL | 2 refills | Status: DC
Start: 2021-10-13 — End: 2022-02-03

## 2021-10-13 NOTE — Progress Notes (Signed)
Provider:  Alain Honey, MD  Careteam: Patient Care Team: Wardell Honour, MD as PCP - General (Family Medicine) Leonie Man, MD as PCP - Cardiology (Cardiology) Lorretta Harp, MD as Consulting Physician (Cardiology) Serafina Mitchell, MD as Consulting Physician (Vascular Surgery) Shon Hough, MD as Consulting Physician (Ophthalmology) Bjorn Loser, MD as Consulting Physician (Urology) Shon Hough, MD as Consulting Physician (Ophthalmology)  PLACE OF SERVICE:  [place] [facility] Directive information    No Known Allergies  Chief Complaint  Patient presents with   Medical Management of Chronic Issues    Patient presents today for 5 month follow-up.   Quality Metric Gaps    TDAP, pneumonia, COVID booster #4     HPI: Patient is a 88 y.o. male patient is here to follow-up chronic problems including atherosclerotic heart disease hyperlipidemia hypertension and anxiety and insomnia.  Generally, he has done well since last visit.  States he would like to increase his doxepin to 75 mg a day as he is not sleeping as well as before. Lipids were last assessed 6 months ago and were at goal on combination of rosuvastatin and Zetia.  Review of Systems:  ROS  Past Medical History:  Diagnosis Date   Acquired deformity of nose    Calculus of kidney    Cervicalgia    Chronic heart failure with preserved ejection fraction (HFpEF) (HCC)    EF ~45-50%   Esophagitis, unspecified    Essential hypertension    Hyperlipidemia    Hypertrophy of prostate with urinary obstruction and other lower urinary tract symptoms (LUTS)    Insomnia, unspecified    Internal hemorrhoids without mention of complication    Left carotid artery stenosis    S/P L CEA - 2015 (Dr. Trula Slade)   Multiple vessel coronary artery disease 2000   ~50% LM-ostLAD with 85% ost-mid LCx, 80% ost RI, 100% CTO RCA -> CABG X 4 (LIMA-LAD, RIMA-dRCA, SVG-OM2, SVG-RI) (Dr. Roxan Hockey) => 06/2019:  STEMI : 100% SVG-OM2 (unable to open), 100% OM2; DES PCI LM-mLCx.    Obesity, unspecified    Old myocardial infarction 06/2019   True posterior STEMI -> occluded SVG.  Native LM--LCX PCI   Other and unspecified hyperlipidemia    Rhinophyma 06/05/2014   Unspecified malignant neoplasm of scalp and skin of neck    Unspecified malignant neoplasm of skin of ear and external auditory canal    Unspecified vitamin D deficiency    Urinary frequency    Past Surgical History:  Procedure Laterality Date   CAROTID ENDARTERECTOMY Left 06-14-14   CE   COLONOSCOPY  2007   Dr Lajoyce Corners, normal   CORONARY ARTERY BYPASS GRAFT  2000   Dr. Roxan Hockey - CABG x 4:  LIMA-LAD, RIMA-dRCA, SVG-OM2, SVG-RI   CORONARY STENT INTERVENTION N/A 06/22/2019   Procedure: CORONARY STENT INTERVENTION;  Surgeon: Leonie Man, MD;  Location: [place] [facility]; unsuccessful attempt at revascularization of SVG-OM2 => Synergy DES PCI from LM-mid LCx (Synergy DES 2.5 mm x 38 mm--> 3.1-2.6 mm); unfortunately OM2 was occluded.  This revascularized OM1, OM 3 and OM 4.   CORONARY/GRAFT ACUTE MI REVASCULARIZATION N/A 06/22/2019   Procedure: CORONARY/GRAFT ACUTE MI REVASCULARIZATION;  Surgeon: Leonie Man, MD;  Location: [place] [facility];; unsuccessful PTCA/aspiration thrombectomy of SVG-OM1 as culprit lesion for STEMI   ENDARTERECTOMY Left 06/14/2014   Procedure: LEFT CAROTID ENDARTERECTOMY WITH VASCUGUARD PATCH ANGIOPLASTY;  Surgeon: Serafina Mitchell, MD;  Location: [facility];  Service: Vascular;  Laterality: Left;   ESOPHAGOGASTRODUODENOSCOPY ENDOSCOPY  2007   Dr Lajoyce Corners, with biopsy   EXCISIONAL HEMORRHOIDECTOMY  1981   LEFT HEART CATH AND CORONARY ANGIOGRAPHY  2000   MV CAD --> CABG x 4   LEFT HEART CATH AND CORS/GRAFTS ANGIOGRAPHY N/A 06/22/2019   Procedure: LEFT HEART CATH AND CORS/GRAFTS ANGIOGRAPHY;  Surgeon: Leonie Man, MD;  CULPRIT LESION = 100% SVG-OM2; Severe native CAD: mLM-Ost LAD-50% w/ Ost LCx 85% (DES PCI OM-OstLCx)-  Treated b/c 100% TO SVG-OM2 as Culprit lesion - unable to open. OM2 100%. 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  GR 1 DD.  No obvious R WMA.  Essentially normal valves.  Mild MR.   trench in right side of jaw after abcesses  06/01/2016   Social History:   reports that he quit smoking about 48 years ago. His smoking use included cigarettes. He has a 50.00 pack-year smoking history. He has never used smokeless tobacco. He reports current alcohol use of about 4.0 standard drinks of alcohol per week. He reports that he does not use drugs.  Family History  Problem Relation Age of Onset   Cancer Mother        LIVER   Cancer Brother        PROSTATE   Cancer Daughter     Medications: Patient's Medications  New Prescriptions   No medications on file  Previous Medications   ALPHA-LIPOIC ACID 200 MG CAPS    Take 200 mg by mouth.   ASCORBIC ACID (VITAMIN C) 500 MG TABLET    Take 500-1,000 mg by mouth daily.   B COMPLEX VITAMINS TABLET    Take 1 tablet by mouth daily.   BOSWELLIA SERRATA (BOSWELLIA PO)    Take 800 mg by mouth.   CHOLECALCIFEROL (VITAMIN D3) 125 MCG (5000 UT) CAPS    Take 5,000 Units by mouth daily.   CINNAMON 500 MG TABS    Take 500 mg by mouth 3 (three) times a week.    CITRUS BERGAMOT PO    Take 1 capsule by mouth 2 (two) times a week.   CLOPIDOGREL (PLAVIX) 75 MG TABLET    TAKE 1 TABLET(75 MG) BY MOUTH DAILY   COENZYME Q10 (CO Q 10) 100 MG CAPS    Take 100 mg by mouth daily.    CYANOCOBALAMIN (VITAMIN B-12 PO)    Take 1 tablet by mouth daily.   DIAZEPAM (VALIUM) 5 MG TABLET    TAKE 1/2 TABLET(2.5 MG) BY MOUTH TWICE DAILY   DOXEPIN  (SINEQUAN) 50 MG CAPSULE    Take one capsule by mouth at bedtime as needed for insomnia or anxiety.   EZETIMIBE (ZETIA) 10 MG TABLET    Take 1 tablet (10 mg total) by mouth daily.   FLUTICASONE-SALMETEROL (ADVAIR) 250-50 MCG/DOSE AEPB    Inhale 1 puff into the lungs once a week.    GRAPE SEED OIL    Take 1 capsule by mouth daily.   GREEN TEA, CAMELLIA SINENSIS, (GREEN TEA EXTRACT PO)    Take 1 capsule by mouth daily.   METOPROLOL SUCCINATE (TOPROL-XL) 50 MG 24 HR  TABLET    Take one tablet by mouth once daily. Take with or immediately following a meal.   NITROGLYCERIN (NITROSTAT) 0.4 MG SL TABLET    Place 1 tablet (0.4 mg total) under the tongue every 5 (five) minutes as needed.   RAMIPRIL (ALTACE) 10 MG CAPSULE    TAKE 1 CAPSULE BY MOUTH EVERY DAY   RESVERATROL 100 MG CAPS    Take 100 mg by mouth daily.    ROSUVASTATIN (CRESTOR) 40 MG TABLET    TAKE 1 TABLET BY MOUTH EVERY MONDAY, WEDNESDAY, AND FRIDAY   TURMERIC PO    Take 1 capsule by mouth as needed.    UNABLE TO FIND    Take 1 capsule by mouth See admin instructions. Swanson Mediterranean Nutrient capsules: Take 1 capsule by mouth once a day   ZINC GLUCONATE 50 MG TABLET    Take 50 mg by mouth daily.  Modified Medications   No medications on file  Discontinued Medications   No medications on file    Physical Exam:  Vitals:   10/13/21 1315  BP: 134/78  Pulse: 73  Temp: (!) 96.9 F (36.1 C)  SpO2: 97%  Weight: 227 lb 6.4 oz (103.1 kg)  Height: '5\' 10"'$  (1.778 m)   Body mass index is 32.63 kg/m. Wt Readings from Last 3 Encounters:  10/13/21 227 lb 6.4 oz (103.1 kg)  05/19/21 223 lb 6.4 oz (101.3 kg)  11/11/20 219 lb 12.8 oz (99.7 kg)    Physical Exam Vitals and nursing note reviewed.  Constitutional:      Appearance: He is obese.  Cardiovascular:     Rate and Rhythm: Normal rate and regular rhythm.  Pulmonary:     Effort: Pulmonary effort is normal.     Breath sounds: Normal breath sounds.  Neurological:     General:  No focal deficit present.     Mental Status: He is alert and oriented to person, place, and time.     Labs reviewed: Basic Metabolic Panel: Recent Labs    05/19/21 1426  NA 139  K 4.1  CL 104  CO2 30  GLUCOSE 93  BUN 13  CREATININE 1.01  CALCIUM 9.5   Liver Function Tests: Recent Labs    05/19/21 1426  AST 20  ALT 16  BILITOT 0.6  PROT 6.9   No results for input(s): "LIPASE", "AMYLASE" in the last 8760 hours. No results for input(s): "AMMONIA" in the last 8760 hours. CBC: No results for input(s): "WBC", "NEUTROABS", "HGB", "HCT", "MCV", "PLT" in the last 8760 hours. Lipid Panel: Recent Labs    05/19/21 1426  CHOL 135  HDL 36*  LDLCALC 70  TRIG 235*  CHOLHDL 3.8   TSH: No results for input(s): "TSH" in the last 8760 hours. A1C: Lab Results  Component Value Date   HGBA1C 5.7 (H) 05/26/2020     Assessment/Plan 1. Bilateral lower extremity edema Does not seem to be a problem; on no diuretic  2. Coronary artery disease involving native coronary artery of native heart with angina pectoris [facility]) Doing well no recent chest pain  3. Essential hypertension Blood pressure 134/78 today on metoprolol and ramipril  4. Hyperlipidemia with target LDL less than 70 Apparently this is followed by his cardiologist.  Most recent LDL was 70 which was done in January of this year    Alain Honey, MD [place] [phone]

## 2021-10-14 ENCOUNTER — Telehealth: Payer: Self-pay

## 2021-10-14 ENCOUNTER — Encounter: Payer: Self-pay | Admitting: Family

## 2021-10-14 ENCOUNTER — Ambulatory Visit (INDEPENDENT_AMBULATORY_CARE_PROVIDER_SITE_OTHER): Payer: Medicare HMO | Admitting: Family

## 2021-10-14 DIAGNOSIS — Z0000 Encounter for general adult medical examination without abnormal findings: Secondary | ICD-10-CM | POA: Diagnosis not present

## 2021-10-14 NOTE — Telephone Encounter (Signed)
Mr. dewon, mendizabal are scheduled for a virtual visit with your provider today.    Just as we do with appointments in the office, we must obtain your consent to participate.  Your consent will be active for this visit and any virtual visit you may have with one of our providers in the next 365 days.    If you have a MyChart account, I can also send a copy of this consent to you electronically.  All virtual visits are billed to your insurance company just like a traditional visit in the office.  As this is a virtual visit, video technology does not allow for your provider to perform a traditional examination.  This may limit your provider's ability to fully assess your condition.  If your provider identifies any concerns that need to be evaluated in person or the need to arrange testing such as labs, EKG, etc, we will make arrangements to do so.    Although advances in technology are sophisticated, we cannot ensure that it will always work on either your end or our end.  If the connection with a video visit is poor, we may have to switch to a telephone visit.  With either a video or telephone visit, we are not always able to ensure that we have a secure connection.   I need to obtain your verbal consent now.   Are you willing to proceed with your visit today?   TAGE FEGGINS has provided verbal consent on 10/14/2021 for a virtual visit (video or telephone).   Leigh Aurora [place], [place] 10/14/2021  11:04 AM

## 2021-10-14 NOTE — Progress Notes (Signed)
This service is provided via telemedicine  No vital signs collected/recorded due to the encounter was a telemedicine visit.   Location of patient (ex: home, work):  Home  Patient consents to a telephone visit: Yes, see telephone visit dated 10/14/21  Location of the provider (ex: office, home):  [facility], Office   Name of any referring provider:  N/A  Names of all persons participating in the telemedicine service and their role in the encounter:  S.Chrae B/CMA, Marlowe Sax, NP, and Patient   Time spent on call:  9 min with medical assistant      Subjective:   Alex [name] is a 88 y.o. male who presents for Medicare Annual/Subsequent preventive examination.  Review of Systems     Cardiac Risk Factors include: advanced age (>14mn, >>58women);diabetes mellitus;hypertension;male gender;smoking/ tobacco exposure;obesity (BMI >30kg/m2);dyslipidemia     Objective:    There were no vitals filed for this visit. There is no height or weight on file to calculate BMI.     10/14/2021   10:56 AM 05/22/2021   11:25 AM 10/16/2020    2:48 PM 05/29/2020   11:55 AM 01/21/2020    1:00 PM 09/14/2019    1:24 PM 07/12/2019    3:29 PM  Advanced Directives  Does Patient Have a Medical Advance Directive? No No No Yes Yes No Yes  Type of ATransport plannerLiving will  Does patient want to make changes to medical advance directive?    No - Patient declined   No - Patient declined  Copy of [place]ain Chart?       Yes - validated most recent copy scanned in chart (See row information)  Would patient like information on creating a medical advance directive? No - Patient declined No - Patient declined No - Patient declined   No - Patient declined     Current Medications (verified) Outpatient Encounter Medications as of 10/14/2021  Medication Sig   Alpha-Lipoic Acid 200 MG CAPS Take 200 mg by mouth.   ascorbic  acid (VITAMIN C) 500 MG tablet Take 500-1,000 mg by mouth daily.   b complex vitamins tablet Take 1 tablet by mouth daily.   Boswellia Serrata (BOSWELLIA PO) Take 800 mg by mouth.   Cholecalciferol (VITAMIN D3) 125 MCG (5000 UT) CAPS Take 5,000 Units by mouth daily.   Cinnamon 500 MG TABS Take 500 mg by mouth 3 (three) times a week.    CITRUS BERGAMOT PO Take 1 capsule by mouth 2 (two) times a week.   clopidogrel (PLAVIX) 75 MG tablet TAKE 1 TABLET(75 MG) BY MOUTH DAILY   Coenzyme Q10 (CO Q 10) 100 MG CAPS Take 100 mg by mouth daily.    Cyanocobalamin (VITAMIN B-12 PO) Take 1 tablet by mouth daily.   diazepam (VALIUM) 5 MG tablet TAKE 1/2 TABLET(2.5 MG) BY MOUTH TWICE DAILY   doxepin (SINEQUAN) 75 MG capsule Take one capsule by mouth at bedtime as needed for insomnia or anxiety.   ezetimibe (ZETIA) 10 MG tablet Take 1 tablet (10 mg total) by mouth daily.   Fluticasone-Salmeterol (ADVAIR) 250-50 MCG/DOSE AEPB Inhale 1 puff into the lungs once a week.    Grape Seed OIL Take 1 capsule by mouth daily.   Green Tea, Camellia sinensis, (GREEN TEA EXTRACT PO) Take 1 capsule by mouth daily.   metoprolol succinate (TOPROL-XL) 50 MG 24 hr tablet Take one tablet  by mouth once daily. Take with or immediately following a meal.   nitroGLYCERIN (NITROSTAT) 0.4 MG SL tablet Place 1 tablet (0.4 mg total) under the tongue every 5 (five) minutes as needed.   ramipril (ALTACE) 10 MG capsule TAKE 1 CAPSULE BY MOUTH EVERY DAY   RESVERATROL 100 MG CAPS Take 100 mg by mouth daily.    rosuvastatin (CRESTOR) 40 MG tablet TAKE 1 TABLET BY MOUTH EVERY MONDAY, WEDNESDAY, AND FRIDAY   TURMERIC PO Take 1 capsule by mouth as needed.    UNABLE TO FIND Take 1 capsule by mouth See admin instructions. Swanson Mediterranean Nutrient capsules: Take 1 capsule by mouth once a day   zinc gluconate 50 MG tablet Take 50 mg by mouth daily.   [DISCONTINUED] doxepin (SINEQUAN) 50 MG capsule Take one capsule by mouth at bedtime as needed  for insomnia or anxiety.   No facility-administered encounter medications on file as of 10/14/2021.    Allergies (verified) Patient has no known allergies.   History: Past Medical History:  Diagnosis Date   Acquired deformity of nose    Calculus of kidney    Cervicalgia    Chronic heart failure with preserved ejection fraction (HFpEF) (HCC)    EF ~45-50%   Esophagitis, unspecified    Essential hypertension    Hyperlipidemia    Hypertrophy of prostate with urinary obstruction and other lower urinary tract symptoms (LUTS)    Insomnia, unspecified    Internal hemorrhoids without mention of complication    Left carotid artery stenosis    S/P L CEA - 2015 (Dr. Trula Slade)   Multiple vessel coronary artery disease 2000   ~50% LM-ostLAD with 85% ost-mid LCx, 80% ost RI, 100% CTO RCA -> CABG X 4 (LIMA-LAD, RIMA-dRCA, SVG-OM2, SVG-RI) (Dr. Roxan Hockey) => 06/2019: STEMI : 100% SVG-OM2 (unable to open), 100% OM2; DES PCI LM-mLCx.    Obesity, unspecified    Old myocardial infarction 06/2019   True posterior STEMI -> occluded SVG.  Native LM--LCX PCI   Other and unspecified hyperlipidemia    Rhinophyma 06/05/2014   Unspecified malignant neoplasm of scalp and skin of neck    Unspecified malignant neoplasm of skin of ear and external auditory canal    Unspecified vitamin D deficiency    Urinary frequency    Past Surgical History:  Procedure Laterality Date   CAROTID ENDARTERECTOMY Left 06-14-14   CE   COLONOSCOPY  2007   Dr Lajoyce Corners, normal   CORONARY ARTERY BYPASS GRAFT  2000   Dr. Roxan Hockey - CABG x 4:  LIMA-LAD, RIMA-dRCA, SVG-OM2, SVG-RI   CORONARY STENT INTERVENTION N/A 06/22/2019   Procedure: CORONARY STENT INTERVENTION;  Surgeon: Leonie Man, MD;  Location: [facility]; unsuccessful attempt at revascularization of SVG-OM2 => Synergy DES PCI from LM-mid LCx (Synergy DES 2.5 mm x 38 mm--> 3.1-2.6 mm); unfortunately OM2 was occluded.  This revascularized OM1, OM 3 and OM 4.    CORONARY/GRAFT ACUTE MI REVASCULARIZATION N/A 06/22/2019   Procedure: CORONARY/GRAFT ACUTE MI REVASCULARIZATION;  Surgeon: Leonie Man, MD;  Location: [facility];; unsuccessful PTCA/aspiration thrombectomy of SVG-OM1 as culprit lesion for STEMI   ENDARTERECTOMY Left 06/14/2014   Procedure: LEFT CAROTID ENDARTERECTOMY WITH VASCUGUARD PATCH ANGIOPLASTY;  Surgeon: Serafina Mitchell, MD;  Location: [facility];  Service: Vascular;  Laterality: Left;   ESOPHAGOGASTRODUODENOSCOPY ENDOSCOPY  2007   Dr Lajoyce Corners, with biopsy   EXCISIONAL [place] CATH AND CORONARY ANGIOGRAPHY  2000  MV CAD --> CABG x 4   LEFT HEART CATH AND CORS/GRAFTS ANGIOGRAPHY N/A 06/22/2019   Procedure: LEFT HEART CATH AND CORS/GRAFTS ANGIOGRAPHY;  Surgeon: Leonie Man, MD;  CULPRIT LESION = 100% SVG-OM2; Severe native CAD: mLM-Ost LAD-50% w/ Ost LCx 85% (DES PCI OM-OstLCx)- Treated b/c 100% TO SVG-OM2 as Culprit lesion - unable to open. OM2 100%. 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  GR 1 DD.  No obvious R WMA.  Essentially normal valves.  Mild MR.   trench in right side of jaw after abcesses  06/01/2016   Family History  Problem Relation Age of Onset   Cancer Mother        LIVER   Cancer Brother        PROSTATE   Cancer Daughter    Social History   Socioeconomic History   Marital status: Widowed    Spouse name: Not on file   Number of children: Not on file   Years of education: Not on file   Highest education level: Not on file  Occupational History   Not on file  Tobacco Use   Smoking status: Former    Packs/day: 2.00    Years: 25.00     Total pack years: 50.00    Types: Cigarettes    Quit date: 05/03/1973    Years since quitting: 48.4   Smokeless tobacco: Never  Vaping Use   Vaping Use: Never used  Substance and Sexual Activity   Alcohol use: Yes    Alcohol/week: 4.0 standard drinks of alcohol    Types: 1 Glasses of wine, 3 Cans of beer per week   Drug use: No   Sexual activity: Never  Other Topics Concern   Not on file  Social History Narrative   Not on file   Social Determinants of Health   Financial Resource Strain: Low Risk  (09/09/2017)   Overall Financial Resource Strain (CARDIA)    Difficulty of Paying Living Expenses: Not hard at all  Food Insecurity: No Food Insecurity (09/09/2017)   Hunger Vital Sign    Worried About Running Out of Food in the Last Year: Never true    Ran Out of Food in the Last Year: Never true  Transportation Needs: No Transportation Needs (09/09/2017)   PRAPARE - Hydrologist (Medical): No    Lack of Transportation (Non-Medical): No  Physical Activity: Insufficiently Active (09/09/2017)   Exercise Vital Sign    Days of Exercise per Week: 7 days    Minutes of Exercise per Session: 20 min  Stress: Stress Concern Present (09/09/2017)   [place]    Feeling of Stress : To some extent  Social Connections: Moderately Isolated (09/09/2017)   Social Connection and Isolation Panel [NHANES]    Frequency of Communication with Friends and Family: More than three times a week    Frequency of Social Gatherings with Friends and Family: More than three times a week    Attends Religious Services: Never    Marine scientist or Organizations: No  Attends Archivist Meetings: Never    Marital Status: Widowed    Tobacco Counseling Counseling given: Not Answered   Clinical Intake:  Pre-visit preparation completed: No  Pain : No/denies pain     BMI - recorded: 32.05 Nutritional  Status: BMI > 30  Obese Nutritional Risks: None Diabetes: Yes CBG done?: No Did pt. bring in CBG monitor from home?: No  How often do you need to have someone help you when you read instructions, pamphlets, or other written materials from your doctor or pharmacy?: 1 - Never What is the last grade level you completed in school?: 16 years  Diabetic?Yes   Interpreter Needed?: No      Activities of Daily Living    10/14/2021   11:41 AM 10/16/2020    3:38 PM  In your present state of health, do you have any difficulty performing the following activities:  Hearing? 0 0  Vision? 0 0  Difficulty concentrating or making decisions? 0 0  Walking or climbing stairs? 0 0  Dressing or bathing? 0 0  Doing errands, shopping? 0 1  Comment  son assist  Preparing Food and eating ? N N  Using the Toilet? N N  In the past six months, have you accidently leaked urine? N N  Do you have problems with loss of bowel control? N N  Managing your Medications? N N  Managing your Finances? N N  Housekeeping or managing your Housekeeping? Aggie Moats  Comment  son assist    Patient Care Team: Wardell Honour, MD as PCP - General (Family Medicine) Leonie Man, MD as PCP - Cardiology (Cardiology) Lorretta Harp, MD as Consulting Physician (Cardiology) Serafina Mitchell, MD as Consulting Physician (Vascular Surgery) Shon Hough, MD as Consulting Physician (Ophthalmology) Bjorn Loser, MD as Consulting Physician (Urology) Shon Hough, MD as Consulting Physician (Ophthalmology)  Indicate any recent Medical Services you may have received from other than [facility] providers in the past year (date may be approximate).     Assessment:   This is a routine wellness examination for Alex [name].  Hearing/Vision screen Hearing Screening - Comments:: No hearing issues  Vision Screening - Comments:: Last eye exam greater than 12 months ago, Dr.Stoneburger  Dietary issues and exercise activities  discussed: Current Exercise Habits: Home exercise routine, Time (Minutes): 25, Frequency (Times/Week): 3, Weekly Exercise (Minutes/Week): 75, Intensity: Moderate, Exercise limited by: None identified   Goals Addressed             This Visit's Progress    Increase water intake   On track    Starting 08/30/2016 I will start drinking 3 glasses of water a day.       Depression Screen    10/14/2021   10:55 AM 10/16/2020    2:40 PM 05/29/2020   11:53 AM 01/21/2020    1:03 PM 09/14/2019    1:19 PM 09/12/2018   10:20 AM 07/10/2018    2:12 PM  PHQ 2/9 Scores  PHQ - 2 Score 0 0 0 0 0 0 0    Fall Risk    10/14/2021   10:55 AM 10/13/2021    1:21 PM 05/22/2021   11:24 AM 05/19/2021    1:27 PM 11/11/2020    1:53 PM  [place] in the past year? 0 0 0 0 0  Number falls in past yr: 0 0 0 0 0  Injury with Fall? 0 0 0 0 0  Risk for fall due  to : No Fall Risks No Fall Risks No Fall Risks No Fall Risks No Fall Risks  Follow up Falls evaluation completed Falls evaluation completed Falls evaluation completed Falls evaluation completed;Education provided;Falls prevention discussed Falls evaluation completed;Education provided;Falls prevention discussed    FALL RISK PREVENTION PERTAINING TO THE HOME:  Any stairs in or around the home? Yes  If so, are there any without handrails? No  Home free of loose throw rugs in walkways, pet beds, electrical cords, etc? No  Adequate lighting in your home to reduce risk of falls? Yes   ASSISTIVE DEVICES UTILIZED TO PREVENT FALLS:  Life alert? No  Use of a cane, walker or w/c? No  Grab bars in the bathroom? Yes  Shower chair or bench in shower? No  Elevated toilet seat or a handicapped toilet? No   TIMED UP AND GO:  Was the test performed? No .  Length of time to ambulate 10 feet: N/A sec.   Gait slow and steady without use of assistive device  Cognitive Function:    01/21/2020    1:15 PM 09/09/2017    1:51 PM 08/30/2016   10:36 AM 08/27/2015     2:55 PM  MMSE - Mini Mental State Exam  Orientation to time '4 5 5 5  '$ Orientation to Place '5 5 5 5  '$ Registration '3 3 3 3  '$ Attention/ Calculation '5 5 5 5  '$ Recall 3 2 0 3  Language- name 2 objects '2 2 2 2  '$ Language- repeat '1 1 1 1  '$ Language- follow 3 step command '3 3 3 3  '$ Language- read & follow direction '1 1 1 1  '$ Write a sentence '1 1 1 1  '$ Copy design 0 1 0 1  Total score '28 29 26 30        '$ 10/14/2021   10:56 AM 10/16/2020    2:43 PM 09/14/2019    1:20 PM 09/12/2018   10:25 AM  6CIT Screen  What Year? 0 points 0 points 0 points 0 points  What month? 0 points 0 points 0 points 0 points  What time? 0 points 0 points 0 points 0 points  Count back from 20 2 points 2 points 0 points 0 points  Months in reverse 0 points 0 points 0 points 0 points  Repeat phrase 2 points 4 points 10 points 0 points  Total Score 4 points 6 points 10 points 0 points    Immunizations Immunization History  Administered Date(s) Administered   Fluad Quad(high Dose 65+) 01/18/2019, 01/21/2020   Influenza Whole 02/06/2013   Influenza, High Dose Seasonal PF 12/09/2015, 01/10/2017, 01/19/2018   Influenza-Unspecified 01/31/2009, 01/08/2014, 12/13/2014   Moderna Covid-19 Vaccine Bivalent Booster 41yr & up 09/24/2021   Moderna Sars-Covid-2 Vaccination 05/12/2019, 07/11/2019, 08/08/2019   Pneumococcal Conjugate-13 11/08/2013   Pneumococcal-Unspecified 05/03/2004   Td 05/03/2004   Tdap 10/31/2019   Zoster, Live 07/19/2012    TDAP status: Up to date  Flu Vaccine status: Up to date  Pneumococcal vaccine status: Due, Education has been provided regarding the importance of this vaccine. Advised may receive this vaccine at local pharmacy or Health Dept. Aware to provide a copy of the vaccination record if obtained from local pharmacy or Health Dept. Verbalized acceptance and understanding.  Covid-19 vaccine status: Completed vaccines  Qualifies for Shingles Vaccine? Yes   Zostavax completed No   Shingrix  Completed?: No.    Education has been provided regarding the importance of this vaccine. Patient has been advised to  call insurance company to determine out of pocket expense if they have not yet received this vaccine. Advised may also receive vaccine at local pharmacy or Health Dept. Verbalized acceptance and understanding.  Screening Tests Health Maintenance  Topic Date Due   Pneumonia Vaccine 38+ Years old (2 - PPSV23 if available, else PCV20) 11/09/2014   INFLUENZA VACCINE  12/01/2021   COVID-19 Vaccine (5 - Moderna series) 01/25/2022   TETANUS/TDAP  10/30/2029   HPV VACCINES  Aged Out   Zoster Vaccines- Shingrix  Discontinued    Health Maintenance  Health Maintenance Due  Topic Date Due   Pneumonia Vaccine 42+ Years old (2 - PPSV23 if available, else PCV20) 11/09/2014    Colorectal cancer screening: No longer required.   Lung Cancer Screening: (Low Dose CT Chest recommended if Age 76-80 years, 30 pack-year currently smoking OR have quit w/in 15years.) does not qualify.   Lung Cancer Screening Referral: No   Additional Screening:  Hepatitis C Screening: does not qualify; Completed No   Vision Screening: Recommended annual ophthalmology exams for early detection of glaucoma and other disorders of the eye. Is the patient up to date with their annual eye exam?  No  Who is the provider or what is the name of the office in which the patient attends annual eye exams? Dr.Stoneburnner  If pt is not established with a provider, would they like to be referred to a provider to establish care? No .   Dental Screening: Recommended annual dental exams for proper oral hygiene  Community Resource Referral / Chronic Care Management: CRR required this visit?  No   CCM required this visit?  No      Plan:     I have personally reviewed and noted the following in the patient's chart:   Medical and social history Use of alcohol, tobacco or illicit drugs  Current medications and  supplements including opioid prescriptions. Patient is not currently taking opioid prescriptions. Functional ability and status Nutritional status Physical activity Advanced directives List of other physicians Hospitalizations, surgeries, and ER visits in previous 12 months Vitals Screenings to include cognitive, depression, and falls Referrals and appointments  In addition, I have reviewed and discussed with patient certain preventive protocols, quality metrics, and best practice recommendations. A written personalized care plan for preventive services as well as general preventive health recommendations were provided to patient.     Sandrea Hughs, NP   10/14/2021   Nurse Notes: Due for 3rd Pneumococcal vaccine

## 2021-10-14 NOTE — Patient Instructions (Signed)
Alex [name] , Thank you for taking time to come for your Medicare Wellness Visit. I appreciate your ongoing commitment to your health goals. Please review the following plan we discussed and let me know if I can assist you in the future.   Screening recommendations/referrals: Colonoscopy N/A  Recommended yearly ophthalmology/optometry visit for glaucoma screening and checkup Recommended yearly dental visit for hygiene and checkup  Vaccinations: Influenza vaccine : Up to date  Pneumococcal vaccine : 3rd dose Due  Tdap vaccine Up to date  Shingles vaccine : declined     Advanced directives: No   Conditions/risks identified: Advance age male > 22 yrs, Type 2 DM,Hypertension,Male gender,Tobacco use,Obesity,Dyslipidemia  Next appointment: 1 year   Preventive Care 6 Years and Older, Male Preventive care refers to lifestyle choices and visits with your health care provider that can promote health and wellness. What does preventive care include? A yearly physical exam. This is also called an annual well check. Dental exams once or twice a year. Routine eye exams. Ask your health care provider how often you should have your eyes checked. Personal lifestyle choices, including: Daily care of your teeth and gums. Regular physical activity. Eating a healthy diet. Avoiding tobacco and drug use. Limiting alcohol use. Practicing safe sex. Taking low doses of aspirin every day. Taking vitamin and mineral supplements as recommended by your health care provider. What happens during an annual well check? The services and screenings done by your health care provider during your annual well check will depend on your age, overall health, lifestyle risk factors, and family history of disease. Counseling  Your health care provider may ask you questions about your: Alcohol use. Tobacco use. Drug use. Emotional well-being. Home and relationship well-being. Sexual activity. Eating habits. History of  falls. Memory and ability to understand (cognition). Work and work Statistician. Screening  You may have the following tests or measurements: Height, weight, and BMI. Blood pressure. Lipid and cholesterol levels. These may be checked every 5 years, or more frequently if you are over 64 years old. Skin check. Lung cancer screening. You may have this screening every year starting at age 61 if you have a 30-pack-year history of smoking and currently smoke or have quit within the past 15 years. Fecal occult blood test (FOBT) of the stool. You may have this test every year starting at age 36. Flexible sigmoidoscopy or colonoscopy. You may have a sigmoidoscopy every 5 years or a colonoscopy every 10 years starting at age 54. Prostate cancer screening. Recommendations will vary depending on your family history and other risks. Hepatitis C blood test. Hepatitis B blood test. Sexually transmitted disease (STD) testing. Diabetes screening. This is done by checking your blood sugar (glucose) after you have not eaten for a while (fasting). You may have this done every 1-3 years. Abdominal aortic aneurysm (AAA) screening. You may need this if you are a current or former smoker. Osteoporosis. You may be screened starting at age 32 if you are at high risk. Talk with your health care provider about your test results, treatment options, and if necessary, the need for more tests. Vaccines  Your health care provider may recommend certain vaccines, such as: Influenza vaccine. This is recommended every year. Tetanus, diphtheria, and acellular pertussis (Tdap, Td) vaccine. You may need a Td booster every 10 years. Zoster vaccine. You may need this after age 55. Pneumococcal 13-valent conjugate (PCV13) vaccine. One dose is recommended after age 37. Pneumococcal polysaccharide (PPSV23) vaccine. One dose is recommended  after age 59. Talk to your health care provider about which screenings and vaccines you need and  how often you need them. This information is not intended to replace advice given to you by your health care provider. Make sure you discuss any questions you have with your health care provider. Document Released: 05/16/2015 Document Revised: 01/07/2016 Document Reviewed: 02/18/2015 Elsevier Interactive Patient Education  2017 [place] Prevention in the Home Falls can cause injuries. They can happen to people of all ages. There are many things you can do to make your home safe and to help prevent falls. What can I do on the outside of my home? Regularly fix the edges of walkways and driveways and fix any cracks. Remove anything that might make you trip as you walk through a door, such as a raised step or threshold. Trim any bushes or trees on the path to your home. Use bright outdoor lighting. Clear any walking paths of anything that might make someone trip, such as rocks or tools. Regularly check to see if handrails are loose or broken. Make sure that both sides of any steps have handrails. Any raised decks and porches should have guardrails on the edges. Have any leaves, snow, or ice cleared regularly. Use sand or salt on walking paths during winter. Clean up any spills in your garage right away. This includes oil or grease spills. What can I do in the bathroom? Use night lights. Install grab bars by the toilet and in the tub and shower. Do not use towel bars as grab bars. Use non-skid mats or decals in the tub or shower. If you need to sit down in the shower, use a plastic, non-slip stool. Keep the floor dry. Clean up any water that spills on the floor as soon as it happens. Remove soap buildup in the tub or shower regularly. Attach bath mats securely with double-sided non-slip rug tape. Do not have throw rugs and other things on the floor that can make you trip. What can I do in the bedroom? Use night lights. Make sure that you have a light by your bed that is easy to  reach. Do not use any sheets or blankets that are too big for your bed. They should not hang down onto the floor. Have a firm chair that has side arms. You can use this for support while you get dressed. Do not have throw rugs and other things on the floor that can make you trip. What can I do in the kitchen? Clean up any spills right away. Avoid walking on wet floors. Keep items that you use a lot in easy-to-reach places. If you need to reach something above you, use a strong step stool that has a grab bar. Keep electrical cords out of the way. Do not use floor polish or wax that makes floors slippery. If you must use wax, use non-skid floor wax. Do not have throw rugs and other things on the floor that can make you trip. What can I do with my stairs? Do not leave any items on the stairs. Make sure that there are handrails on both sides of the stairs and use them. Fix handrails that are broken or loose. Make sure that handrails are as long as the stairways. Check any carpeting to make sure that it is firmly attached to the stairs. Fix any carpet that is loose or worn. Avoid having throw rugs at the top or bottom of the stairs. If you  do have throw rugs, attach them to the floor with carpet tape. Make sure that you have a light switch at the top of the stairs and the bottom of the stairs. If you do not have them, ask someone to add them for you. What else can I do to help prevent falls? Wear shoes that: Do not have high heels. Have rubber bottoms. Are comfortable and fit you well. Are closed at the toe. Do not wear sandals. If you use a stepladder: Make sure that it is fully opened. Do not climb a closed stepladder. Make sure that both sides of the stepladder are locked into place. Ask someone to hold it for you, if possible. Clearly mark and make sure that you can see: Any grab bars or handrails. First and last steps. Where the edge of each step is. Use tools that help you move  around (mobility aids) if they are needed. These include: Canes. Walkers. Scooters. Crutches. Turn on the lights when you go into a dark area. Replace any light bulbs as soon as they burn out. Set up your furniture so you have a clear path. Avoid moving your furniture around. If any of your floors are uneven, fix them. If there are any pets around you, be aware of where they are. Review your medicines with your doctor. Some medicines can make you feel dizzy. This can increase your chance of falling. Ask your doctor what other things that you can do to help prevent falls. This information is not intended to replace advice given to you by your health care provider. Make sure you discuss any questions you have with your health care provider. Document Released: 02/13/2009 Document Revised: 09/25/2015 Document Reviewed: 05/24/2014 Elsevier Interactive Patient Education  2017 [employer].

## 2021-10-16 ENCOUNTER — Encounter: Payer: Medicare HMO | Admitting: Family

## 2021-10-23 ENCOUNTER — Other Ambulatory Visit: Payer: Self-pay | Admitting: *Deleted

## 2021-10-23 DIAGNOSIS — I6523 Occlusion and stenosis of bilateral carotid arteries: Secondary | ICD-10-CM

## 2021-11-09 ENCOUNTER — Ambulatory Visit: Payer: Medicare HMO | Admitting: Physician Assistant

## 2021-11-09 ENCOUNTER — Ambulatory Visit (HOSPITAL_COMMUNITY)
Admission: RE | Admit: 2021-11-09 | Discharge: 2021-11-09 | Disposition: A | Discharge status: Home / Self Care | Payer: Medicare HMO | Source: Ambulatory Visit | Attending: Surgery | Admitting: Surgery

## 2021-11-09 VITALS — BP 132/67 | HR 82 | Temp 97.9°F | Resp 20 | Ht 70.0 in | Wt 227.0 lb

## 2021-11-09 DIAGNOSIS — I6523 Occlusion and stenosis of bilateral carotid arteries: Secondary | ICD-10-CM | POA: Diagnosis not present

## 2021-11-09 NOTE — Progress Notes (Signed)
Office Note     CC:  follow up Requesting Provider:  Wardell Honour, MD  HPI: Alex [name] is a 88 y.o. (October 14, 1931) male who presents for surveillance of carotid artery stenosis.  He underwent left carotid endarterectomy in 2016 by Dr. Trula Slade due to high-grade asymptomatic stenosis.  He denies diagnosis of CVA or TIA since last office visit.  He also denies any current strokelike symptoms including slurring speech, changes in vision, or one-sided weakness.  He follows regularly with his PCP.  He walks about 4500 steps per day.  He is on Plavix for CAD with history of coronary stenting in February 2021.    Past Medical History:  Diagnosis Date   Acquired deformity of nose    Calculus of kidney    Cervicalgia    Chronic heart failure with preserved ejection fraction (HFpEF) (HCC)    EF ~45-50%   Esophagitis, unspecified    Essential hypertension    Hyperlipidemia    Hypertrophy of prostate with urinary obstruction and other lower urinary tract symptoms (LUTS)    Insomnia, unspecified    Internal hemorrhoids without mention of complication    Left carotid artery stenosis    S/P L CEA - 2015 (Dr. Trula Slade)   Multiple vessel coronary artery disease 2000   ~50% LM-ostLAD with 85% ost-mid LCx, 80% ost RI, 100% CTO RCA -> CABG X 4 (LIMA-LAD, RIMA-dRCA, SVG-OM2, SVG-RI) (Dr. Roxan Hockey) => 06/2019: STEMI : 100% SVG-OM2 (unable to open), 100% OM2; DES PCI LM-mLCx.    Obesity, unspecified    Old myocardial infarction 06/2019   True posterior STEMI -> occluded SVG.  Native LM--LCX PCI   Other and unspecified hyperlipidemia    Rhinophyma 06/05/2014   Unspecified malignant neoplasm of scalp and skin of neck    Unspecified malignant neoplasm of skin of ear and external auditory canal    Unspecified vitamin D deficiency    Urinary frequency     Past Surgical History:  Procedure Laterality Date   CAROTID ENDARTERECTOMY Left 06-14-14   CE   COLONOSCOPY  2007   Dr Lajoyce Corners, normal   CORONARY  ARTERY BYPASS GRAFT  2000   Dr. Roxan Hockey - CABG x 4:  LIMA-LAD, RIMA-dRCA, SVG-OM2, SVG-RI   CORONARY STENT INTERVENTION N/A 06/22/2019   Procedure: CORONARY STENT INTERVENTION;  Surgeon: Leonie Man, MD;  Location: [facility]; unsuccessful attempt at revascularization of SVG-OM2 => Synergy DES PCI from LM-mid LCx (Synergy DES 2.5 mm x 38 mm--> 3.1-2.6 mm); unfortunately OM2 was occluded.  This revascularized OM1, OM 3 and OM 4.   CORONARY/GRAFT ACUTE MI REVASCULARIZATION N/A 06/22/2019   Procedure: CORONARY/GRAFT ACUTE MI REVASCULARIZATION;  Surgeon: Leonie Man, MD;  Location: [facility];; unsuccessful PTCA/aspiration thrombectomy of SVG-OM1 as culprit lesion for STEMI   ENDARTERECTOMY Left 06/14/2014   Procedure: LEFT CAROTID ENDARTERECTOMY WITH VASCUGUARD PATCH ANGIOPLASTY;  Surgeon: Serafina Mitchell, MD;  Location: [facility];  Service: Vascular;  Laterality: Left;   ESOPHAGOGASTRODUODENOSCOPY ENDOSCOPY  2007   Dr Lajoyce Corners, with biopsy   EXCISIONAL [place] CATH AND CORONARY ANGIOGRAPHY  2000   MV CAD --> CABG x 4   LEFT HEART CATH AND CORS/GRAFTS ANGIOGRAPHY N/A 06/22/2019   Procedure: LEFT HEART CATH AND CORS/GRAFTS ANGIOGRAPHY;  Surgeon: Leonie Man, MD;  CULPRIT LESION = 100% SVG-OM2; Severe native CAD: mLM-Ost LAD-50% w/ Ost LCx 85% (DES PCI OM-OstLCx)- Treated b/c 100% TO SVG-OM2 as Culprit lesion - unable  to open. OM2 100%. 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  GR 1 DD.  No obvious R WMA.  Essentially normal valves.  Mild MR.   trench in right side of jaw after abcesses  06/01/2016    Social  History   Socioeconomic History   Marital status: Widowed    Spouse name: Not on file   Number of children: Not on file   Years of education: Not on file   Highest education level: Not on file  Occupational History   Not on file  Tobacco Use   Smoking status: Former    Packs/day: 2.00    Years: 25.00    Total pack years: 50.00    Types: Cigarettes    Quit date: 05/03/1973    Years since quitting: 48.5    Passive exposure: Never   Smokeless tobacco: Never  Vaping Use   Vaping Use: Never used  Substance and Sexual Activity   Alcohol use: Yes    Alcohol/week: 4.0 standard drinks of alcohol    Types: 1 Glasses of wine, 3 Cans of beer per week   Drug use: No   Sexual activity: Never  Other Topics Concern   Not on file  Social History Narrative   Not on file   Social Determinants of Health   Financial Resource Strain: Low Risk  (09/09/2017)   Overall Financial Resource Strain (CARDIA)    Difficulty of Paying Living Expenses: Not hard at all  Food Insecurity: No Food Insecurity (09/09/2017)   Hunger Vital Sign    Worried About Running Out of Food in the Last Year: Never true    Ran Out of Food in the Last Year: Never true  Transportation Needs: No Transportation Needs (09/09/2017)   PRAPARE - Hydrologist (Medical): No    Lack of Transportation (Non-Medical): No  Physical Activity: Insufficiently Active (09/09/2017)   Exercise Vital Sign    Days of Exercise per Week: 7 days    Minutes of Exercise per Session: 20 min  Stress: Stress Concern Present (09/09/2017)   [place]    Feeling of Stress : To some extent  Social Connections: Moderately Isolated (09/09/2017)   Social Connection and Isolation Panel [NHANES]    Frequency of Communication with Friends and Family: More than three times a week    Frequency of Social Gatherings with Friends and Family: More than three times a week     Attends Religious Services: Never    Marine scientist or Organizations: No    Attends Archivist Meetings: Never    Marital Status: Widowed  Intimate Partner Violence: Not At Risk (09/09/2017)   Humiliation, Afraid, Rape, and Kick questionnaire    Fear of Current or Ex-Partner: No    Emotionally Abused: No    Physically Abused: No    Sexually Abused: No    Family History  Problem Relation Age of Onset   Cancer Mother        LIVER   Cancer Brother        PROSTATE  Cancer Daughter     Current Outpatient Medications  Medication Sig Dispense Refill   Alpha-Lipoic Acid 200 MG CAPS Take 200 mg by mouth.     ascorbic acid (VITAMIN C) 500 MG tablet Take 500-1,000 mg by mouth daily.     b complex vitamins tablet Take 1 tablet by mouth daily.     Boswellia Serrata (BOSWELLIA PO) Take 800 mg by mouth.     Cholecalciferol (VITAMIN D3) 125 MCG (5000 UT) CAPS Take 5,000 Units by mouth daily.     Cinnamon 500 MG TABS Take 500 mg by mouth 3 (three) times a week.      CITRUS BERGAMOT PO Take 1 capsule by mouth 2 (two) times a week.     clopidogrel (PLAVIX) 75 MG tablet TAKE 1 TABLET(75 MG) BY MOUTH DAILY 90 tablet 0   Coenzyme Q10 (CO Q 10) 100 MG CAPS Take 100 mg by mouth daily.      Cyanocobalamin (VITAMIN B-12 PO) Take 1 tablet by mouth daily.     diazepam (VALIUM) 5 MG tablet TAKE 1/2 TABLET(2.5 MG) BY MOUTH TWICE DAILY 30 tablet 1   doxepin (SINEQUAN) 75 MG capsule Take one capsule by mouth at bedtime as needed for insomnia or anxiety. 90 capsule 2   ezetimibe (ZETIA) 10 MG tablet Take 1 tablet (10 mg total) by mouth daily. 90 tablet 3   Fluticasone-Salmeterol (ADVAIR) 250-50 MCG/DOSE AEPB Inhale 1 puff into the lungs once a week.      Grape Seed OIL Take 1 capsule by mouth daily.     Green Tea, Camellia sinensis, (GREEN TEA EXTRACT PO) Take 1 capsule by mouth daily.     metoprolol succinate (TOPROL-XL) 50 MG 24 hr tablet Take one tablet by mouth once daily. Take with or  immediately following a meal. 90 tablet 1   nitroGLYCERIN (NITROSTAT) 0.4 MG SL tablet Place 1 tablet (0.4 mg total) under the tongue every 5 (five) minutes as needed. 25 tablet 12   ramipril (ALTACE) 10 MG capsule TAKE 1 CAPSULE BY MOUTH EVERY DAY 90 capsule 1   RESVERATROL 100 MG CAPS Take 100 mg by mouth daily.      rosuvastatin (CRESTOR) 40 MG tablet TAKE 1 TABLET BY MOUTH EVERY MONDAY, WEDNESDAY, AND FRIDAY 45 tablet 3   TURMERIC PO Take 1 capsule by mouth as needed.      UNABLE TO FIND Take 1 capsule by mouth See admin instructions. [place] Mediterranean Nutrient capsules: Take 1 capsule by mouth once a day     zinc gluconate 50 MG tablet Take 50 mg by mouth daily.     No current facility-administered medications for this visit.    No Known Allergies   REVIEW OF SYSTEMS:   '[X]'$  denotes positive finding, '[ ]'$  denotes negative finding Cardiac  Comments:  Chest pain or chest pressure:    Shortness of breath upon exertion:    Short of breath when lying flat:    Irregular heart rhythm:        Vascular    Pain in calf, thigh, or hip brought on by ambulation:    Pain in feet at night that wakes you up from your sleep:     Blood clot in your veins:    Leg swelling:         Pulmonary    Oxygen at home:    Productive cough:     Wheezing:         Neurologic    Sudden weakness  in arms or legs:     Sudden numbness in arms or legs:     Sudden onset of difficulty speaking or slurred speech:    Temporary loss of vision in one eye:     Problems with dizziness:         Gastrointestinal    Blood in stool:     Vomited blood:         Genitourinary    Burning when urinating:     Blood in urine:        Psychiatric    Major depression:         Hematologic    Bleeding problems:    Problems with blood clotting too easily:        Skin    Rashes or ulcers:        Constitutional    Fever or chills:      PHYSICAL EXAMINATION:  Vitals:   11/09/21 1447 11/09/21 1449  BP:  137/68 132/67  Pulse: 82   Resp: 20   Temp: 97.9 F (36.6 C)   TempSrc: Temporal   SpO2: 95%   Weight: 227 lb (103 kg)   Height: '5\' 10"'$  (1.778 m)     General:  WDWN in NAD; vital signs documented above Gait: Not observed HENT: WNL, normocephalic Pulmonary: normal non-labored breathing , without Rales, rhonchi,  wheezing Cardiac: regular HR Abdomen: soft, NT, no masses Skin: without rashes Extremities: without ischemic changes, without Gangrene , without cellulitis; without open wounds;  Musculoskeletal: no muscle wasting or atrophy  Neurologic: A&O X 3;  CN grossly intact Psychiatric:  The pt has Normal affect.   Non-Invasive Vascular Imaging:   Bilateral ICA 1 to 39% stenosis    ASSESSMENT/PLAN:: 88 y.o. male here for follow up for carotid artery stenosis surveillance  -Subjectively has not had any neurological events since last office visit -Carotid duplex is stable; 1 to 39% stenosis of bilateral internal carotid arteries -Continue Plavix and statin daily -Recheck carotid duplex in 18 months   Dagoberto Ligas, PA-C [facility] [phone]  Clinic MD:   Trula Slade

## 2021-11-12 ENCOUNTER — Ambulatory Visit (INDEPENDENT_AMBULATORY_CARE_PROVIDER_SITE_OTHER): Payer: Medicare HMO | Admitting: Orthopedic Surgery

## 2021-11-12 ENCOUNTER — Other Ambulatory Visit: Payer: Self-pay | Admitting: Cardiology

## 2021-11-12 ENCOUNTER — Encounter: Payer: Self-pay | Admitting: Orthopedic Surgery

## 2021-11-12 VITALS — BP 120/68 | Temp 97.7°F | Resp 18 | Ht 70.0 in | Wt 227.0 lb

## 2021-11-12 DIAGNOSIS — S1096XA Insect bite of unspecified part of neck, initial encounter: Secondary | ICD-10-CM | POA: Diagnosis not present

## 2021-11-12 DIAGNOSIS — W57XXXA Bitten or stung by nonvenomous insect and other nonvenomous arthropods, initial encounter: Secondary | ICD-10-CM

## 2021-11-12 MED ORDER — DOXYCYCLINE HYCLATE 100 MG PO TABS: 100.0000 mg | ORAL_TABLET | Freq: Two times a day (BID) | ORAL | 0 refills | Status: AC

## 2021-11-12 NOTE — Patient Instructions (Signed)
Have your son check neck every day  Will start antibiotic for tick disease prevention- if you get some diarrhea- eat yogurt or take probiotic  Contact provider if symptoms worsen

## 2021-11-12 NOTE — Progress Notes (Signed)
Careteam: Patient Care Team: Wardell Honour, MD as PCP - General (Family Medicine) Leonie Man, MD as PCP - Cardiology (Cardiology) Lorretta Harp, MD as Consulting Physician (Cardiology) Serafina Mitchell, MD as Consulting Physician (Vascular Surgery) Shon Hough, MD as Consulting Physician (Ophthalmology) Bjorn Loser, MD as Consulting Physician (Urology) Shon Hough, MD as Consulting Physician (Ophthalmology)  Seen by: Windell Moulding, AGNP-C  PLACE OF SERVICE:  [place] Directive information Does Patient Have a Medical Advance Directive?: No, Would patient like information on creating a medical advance directive?: No - Patient declined  No Known Allergies  Chief Complaint  Patient presents with   Acute Visit    Patient is being seen for a tic bite on back of neck that still may have head in it...     HPI: Patient is a 88 y.o. male seen today foe acute visit due to recent tick bite.   About 5 days ago he noticed a tick bite to the back of his neck. Admits to being outside for a prolonged period of time. Son removed the tick with tweezers- believes it was intact. He does have a small raised bump that developed after removal. His daughter is a Marine scientist and advised him see doctor. Denies fever, fatigue, N/V, headaches or muscle aches.   Review of Systems:  Review of Systems  Constitutional:  Negative for chills, fever, malaise/fatigue and weight loss.  Respiratory:  Negative for cough, shortness of breath and wheezing.   Cardiovascular:  Negative for chest pain and leg swelling.  Musculoskeletal:  Negative for myalgias.  Skin:  Negative for itching and rash.  Neurological:  Negative for dizziness and headaches.  Psychiatric/Behavioral:  Negative for depression. The patient is not nervous/anxious.     Past Medical History:  Diagnosis Date   Acquired deformity of nose    Calculus of kidney    Cervicalgia    Chronic heart failure with  preserved ejection fraction (HFpEF) (HCC)    EF ~45-50%   Esophagitis, unspecified    Essential hypertension    Hyperlipidemia    Hypertrophy of prostate with urinary obstruction and other lower urinary tract symptoms (LUTS)    Insomnia, unspecified    Internal hemorrhoids without mention of complication    Left carotid artery stenosis    S/P L CEA - 2015 (Dr. Trula Slade)   Multiple vessel coronary artery disease 2000   ~50% LM-ostLAD with 85% ost-mid LCx, 80% ost RI, 100% CTO RCA -> CABG X 4 (LIMA-LAD, RIMA-dRCA, SVG-OM2, SVG-RI) (Dr. Roxan Hockey) => 06/2019: STEMI : 100% SVG-OM2 (unable to open), 100% OM2; DES PCI LM-mLCx.    Obesity, unspecified    Old myocardial infarction 06/2019   True posterior STEMI -> occluded SVG.  Native LM--LCX PCI   Other and unspecified hyperlipidemia    Rhinophyma 06/05/2014   Unspecified malignant neoplasm of scalp and skin of neck    Unspecified malignant neoplasm of skin of ear and external auditory canal    Unspecified vitamin D deficiency    Urinary frequency    Past Surgical History:  Procedure Laterality Date   CAROTID ENDARTERECTOMY Left 06-14-14   CE   COLONOSCOPY  2007   Dr Lajoyce Corners, normal   CORONARY ARTERY BYPASS GRAFT  2000   Dr. Roxan Hockey - CABG x 4:  LIMA-LAD, RIMA-dRCA, SVG-OM2, SVG-RI   CORONARY STENT INTERVENTION N/A 06/22/2019   Procedure: CORONARY STENT INTERVENTION;  Surgeon: Leonie Man, MD;  Location: [facility]; unsuccessful attempt  at revascularization of SVG-OM2 => Synergy DES PCI from LM-mid LCx (Synergy DES 2.5 mm x 38 mm--> 3.1-2.6 mm); unfortunately OM2 was occluded.  This revascularized OM1, OM 3 and OM 4.   CORONARY/GRAFT ACUTE MI REVASCULARIZATION N/A 06/22/2019   Procedure: CORONARY/GRAFT ACUTE MI REVASCULARIZATION;  Surgeon: Leonie Man, MD;  Location: [facility];; unsuccessful PTCA/aspiration thrombectomy of SVG-OM1 as culprit lesion for STEMI   ENDARTERECTOMY Left 06/14/2014   Procedure: LEFT CAROTID  ENDARTERECTOMY WITH VASCUGUARD PATCH ANGIOPLASTY;  Surgeon: Serafina Mitchell, MD;  Location: [place];  Service: Vascular;  Laterality: Left;   ESOPHAGOGASTRODUODENOSCOPY ENDOSCOPY  2007   Dr Lajoyce Corners, with biopsy   EXCISIONAL [place] CATH AND CORONARY ANGIOGRAPHY  2000   MV CAD --> CABG x 4   LEFT HEART CATH AND CORS/GRAFTS ANGIOGRAPHY N/A 06/22/2019   Procedure: LEFT HEART CATH AND CORS/GRAFTS ANGIOGRAPHY;  Surgeon: Leonie Man, MD;  CULPRIT LESION = 100% SVG-OM2; Severe native CAD: mLM-Ost LAD-50% w/ Ost LCx 85% (DES PCI OM-OstLCx)- Treated b/c 100% TO SVG-OM2 as Culprit lesion - unable to open. OM2 100%. 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  GR 1 DD.  No obvious R WMA.  Essentially normal valves.  Mild MR.   trench in right side of jaw after abcesses  06/01/2016   Social History:   reports that he quit smoking about 48 years ago. His smoking use included cigarettes. He has a 50.00 pack-year smoking history. He has never been exposed to tobacco smoke. He has never used smokeless tobacco. He reports current alcohol use of about 4.0 standard drinks of alcohol per week. He reports that he does not use drugs.  Family History  Problem Relation Age of Onset   Cancer Mother        LIVER   Cancer Brother        PROSTATE   Cancer Daughter     Medications: Patient's Medications  New Prescriptions   No medications on file  Previous Medications   ALPHA-LIPOIC ACID 200 MG CAPS    Take 200 mg by mouth.   ASCORBIC ACID (VITAMIN C) 500 MG TABLET    Take 500-1,000 mg by mouth daily.   B COMPLEX VITAMINS TABLET    Take 1 tablet by  mouth daily.   BOSWELLIA SERRATA (BOSWELLIA PO)    Take 800 mg by mouth.   CHOLECALCIFEROL (VITAMIN D3) 125 MCG (5000 UT) CAPS    Take 5,000 Units by mouth daily.   CINNAMON 500 MG TABS    Take 500 mg by mouth 3 (three) times a week.    CITRUS BERGAMOT PO    Take 1 capsule by mouth 2 (two) times a week.   CLOPIDOGREL (PLAVIX) 75 MG TABLET    TAKE 1 TABLET(75 MG) BY MOUTH DAILY   COENZYME Q10 (CO Q 10) 100 MG CAPS    Take 100 mg by mouth daily.    CYANOCOBALAMIN (VITAMIN B-12 PO)    Take 1 tablet by mouth daily.   DIAZEPAM (VALIUM) 5 MG TABLET    TAKE 1/2 TABLET(2.5 MG) BY  MOUTH TWICE DAILY   DOXEPIN (SINEQUAN) 75 MG CAPSULE    Take one capsule by mouth at bedtime as needed for insomnia or anxiety.   EZETIMIBE (ZETIA) 10 MG TABLET    Take 1 tablet (10 mg total) by mouth daily.   FLUTICASONE-SALMETEROL (ADVAIR) 250-50 MCG/DOSE AEPB    Inhale 1 puff into the lungs once a week.    GRAPE SEED OIL    Take 1 capsule by mouth daily.   GREEN TEA, CAMELLIA SINENSIS, (GREEN TEA EXTRACT PO)    Take 1 capsule by mouth daily.   METOPROLOL SUCCINATE (TOPROL-XL) 50 MG 24 HR TABLET    Take one tablet by mouth once daily. Take with or immediately following a meal.   NITROGLYCERIN (NITROSTAT) 0.4 MG SL TABLET    Place 1 tablet (0.4 mg total) under the tongue every 5 (five) minutes as needed.   RAMIPRIL (ALTACE) 10 MG CAPSULE    TAKE 1 CAPSULE BY MOUTH EVERY DAY   RESVERATROL 100 MG CAPS    Take 100 mg by mouth daily.    ROSUVASTATIN (CRESTOR) 40 MG TABLET    TAKE 1 TABLET BY MOUTH EVERY MONDAY, WEDNESDAY, AND FRIDAY   TURMERIC PO    Take 1 capsule by mouth as needed.    UNABLE TO FIND    Take 1 capsule by mouth See admin instructions. Swanson Mediterranean Nutrient capsules: Take 1 capsule by mouth once a day   ZINC GLUCONATE 50 MG TABLET    Take 50 mg by mouth daily.  Modified Medications   No medications on file  Discontinued Medications   No medications on file    Physical Exam:  Vitals:   11/12/21  1514  BP: 120/68  Resp: 18  Temp: 97.7 F (36.5 C)  SpO2: 97%  Weight: 227 lb (103 kg)  Height: '5\' 10"'$  (1.778 m)   Body mass index is 32.57 kg/m. Wt Readings from Last 3 Encounters:  11/12/21 227 lb (103 kg)  11/09/21 227 lb (103 kg)  10/13/21 227 lb 6.4 oz (103.1 kg)    Physical Exam Vitals reviewed.  Constitutional:      General: He is not in acute distress. HENT:     Head: Normocephalic.  Cardiovascular:     Rate and Rhythm: Normal rate and regular rhythm.     Pulses: Normal pulses.     Heart sounds: Normal heart sounds.  Pulmonary:     Effort: Pulmonary effort is normal. No respiratory distress.     Breath sounds: Normal breath sounds. No wheezing.  Skin:    General: Skin is warm and dry.     Capillary Refill: Capillary refill takes less than 2 seconds.     Findings: Lesion present.     Comments: Approx < 0.25 cm lesion to right neck base, small fluid sac broken with tweezers, no remaining tick visible, drainage clear, surrounding skin intact   Neurological:     General: No focal deficit present.     Mental Status: He is alert and oriented to person, place, and time.  Psychiatric:        Mood and Affect: Mood normal.        Behavior: Behavior normal.     Labs reviewed: Basic Metabolic Panel: Recent Labs    05/19/21 1426  NA 139  K 4.1  CL 104  CO2 30  GLUCOSE 93  BUN 13  CREATININE 1.01  CALCIUM 9.5   Liver Function Tests: Recent Labs    05/19/21  1426  AST 20  ALT 16  BILITOT 0.6  PROT 6.9   No results for input(s): "LIPASE", "AMYLASE" in the last 8760 hours. No results for input(s): "AMMONIA" in the last 8760 hours. CBC: No results for input(s): "WBC", "NEUTROABS", "HGB", "HCT", "MCV", "PLT" in the last 8760 hours. Lipid Panel: Recent Labs    05/19/21 1426  CHOL 135  HDL 36*  LDLCALC 70  TRIG 235*  CHOLHDL 3.8   TSH: No results for input(s): "TSH" in the last 8760 hours. A1C: Lab Results  Component Value Date   HGBA1C 5.7 (H)  05/26/2020     Assessment/Plan 1. Tick bite of neck, initial encounter - onset 11/07/2021 - tick removed by son with tweezers - < 0.25 cm lesion to right neck base- no tick after skin broken - asymptomatic at this time - start doxy for prophylaxis - advised to contact PCP if symptoms worsen or do not go away - doxycycline (VIBRA-TABS) 100 MG tablet; Take 1 tablet (100 mg total) by mouth 2 (two) times daily for 14 days.  Dispense: 28 tablet; Refill: 0  Total time: 15 minutes. Greater than 50% of total time spent doing patient education regarding tick bite treatment and prevention.    Next appt: none [facility] [place], [facility] [phone]

## 2022-01-06 ENCOUNTER — Other Ambulatory Visit: Payer: Self-pay

## 2022-01-06 MED ORDER — DIAZEPAM 5 MG PO TABS: ORAL_TABLET | ORAL | 1 refills | Status: DC

## 2022-01-06 NOTE — Telephone Encounter (Signed)
Refill request received from pharmacy for diazepam 5 mg tablet.  Medication pended and sent to Dr. Alain Honey

## 2022-02-01 ENCOUNTER — Telehealth: Payer: Self-pay

## 2022-02-01 NOTE — Telephone Encounter (Signed)
Patient called requesting refill of doxepin 50 mg tablet. Doxepin 50 mg is not on patient's medication list, but he says he normally takes it.  Please advise what patient should be taking. Message routed to Dr. Alain Honey

## 2022-02-02 ENCOUNTER — Telehealth: Payer: Self-pay | Admitting: *Deleted

## 2022-02-02 ENCOUNTER — Other Ambulatory Visit: Payer: Self-pay

## 2022-02-02 DIAGNOSIS — I10 Essential (primary) hypertension: Secondary | ICD-10-CM

## 2022-02-02 MED ORDER — METOPROLOL SUCCINATE ER 50 MG PO TB24: ORAL_TABLET | ORAL | 3 refills | Status: DC

## 2022-02-02 NOTE — Telephone Encounter (Signed)
Patient called and stated that he has DECREASED his Doxepin to '50mg'$  and requesting a new Rx for this.   We have '75mg'$  in current medication list.   Is it ok to Change to Doxepin '50mg'$  once daily and update in medication list and refill it?  Please Advise.

## 2022-02-03 ENCOUNTER — Other Ambulatory Visit: Payer: Self-pay

## 2022-02-03 MED ORDER — DOXEPIN HCL 50 MG PO CAPS: 50.0000 mg | ORAL_CAPSULE | Freq: Every day | ORAL | 1 refills | Status: DC

## 2022-02-03 NOTE — Telephone Encounter (Signed)
Okay to change doxepin to 50 mg once a day and updated medication

## 2022-02-03 NOTE — Telephone Encounter (Signed)
High warnings came up when trying to fill mediction.  Medication pended and sent to Dr. Alain Honey

## 2022-02-03 NOTE — Telephone Encounter (Signed)
Medication List updated and Rx Pended and sent to Dr. Sabra Heck for approval due to Rober[place] Warning.

## 2022-02-04 MED ORDER — DOXEPIN HCL 50 MG PO CAPS: 50.0000 mg | ORAL_CAPSULE | Freq: Every day | ORAL | 1 refills | Status: DC

## 2022-02-04 NOTE — Telephone Encounter (Signed)
Rx forwarded to [facility]  due to [place] ( Dr. Sabra Heck out of office.)

## 2022-02-04 NOTE — Telephone Encounter (Signed)
Rx sent 

## 2022-04-08 ENCOUNTER — Other Ambulatory Visit: Payer: Self-pay | Admitting: *Deleted

## 2022-04-08 DIAGNOSIS — I10 Essential (primary) hypertension: Secondary | ICD-10-CM

## 2022-04-08 MED ORDER — RAMIPRIL 10 MG PO CAPS: ORAL_CAPSULE | ORAL | 1 refills | Status: DC

## 2022-04-08 NOTE — Progress Notes (Signed)
Pharmacy requested refill

## 2022-04-12 ENCOUNTER — Other Ambulatory Visit: Payer: Self-pay | Admitting: Cardiology

## 2022-04-13 ENCOUNTER — Ambulatory Visit: Payer: Medicare HMO | Admitting: Family Medicine

## 2022-04-14 ENCOUNTER — Ambulatory Visit (INDEPENDENT_AMBULATORY_CARE_PROVIDER_SITE_OTHER): Payer: Medicare HMO | Admitting: Family Medicine

## 2022-04-14 ENCOUNTER — Encounter: Payer: Self-pay | Admitting: Family Medicine

## 2022-04-14 VITALS — BP 128/70 | HR 76 | Temp 97.9°F | Ht 70.0 in | Wt 230.0 lb

## 2022-04-14 DIAGNOSIS — R600 Localized edema: Secondary | ICD-10-CM | POA: Diagnosis not present

## 2022-04-14 DIAGNOSIS — I25119 Atherosclerotic heart disease of native coronary artery with unspecified angina pectoris: Secondary | ICD-10-CM

## 2022-04-14 DIAGNOSIS — I10 Essential (primary) hypertension: Secondary | ICD-10-CM

## 2022-04-14 DIAGNOSIS — R7303 Prediabetes: Secondary | ICD-10-CM | POA: Diagnosis not present

## 2022-04-14 NOTE — Progress Notes (Signed)
Provider:  Alain Honey, MD  Careteam: Patient Care Team: Wardell Honour, MD as PCP - General (Family Medicine) Leonie Man, MD as PCP - Cardiology (Cardiology) Lorretta Harp, MD as Consulting Physician (Cardiology) Serafina Mitchell, MD as Consulting Physician (Vascular Surgery) Shon Hough, MD as Consulting Physician (Ophthalmology) Bjorn Loser, MD as Consulting Physician (Urology) Shon Hough, MD as Consulting Physician (Ophthalmology)  PLACE OF SERVICE:  [place] Directive information    No Known Allergies  Chief Complaint  Patient presents with   Medical Management of Chronic Issues    Patient presents today for a  month follow-up   Quality Metric Gaps    Pneumonia, COVID#5,Flu     HPI: Patient is a 88 y.o. male patient is here for medical management of chronic problems including hypertension hyperlipidemia atherosclerotic heart disease and edema.  He has no specific complaints today but would like to get his blood checked because he is worried about diabetes.  Actually the reason he is worried is because of some swelling in his feet.  He had an A1c done about a year ago that was borderline upper limits of normal. He denies any chest pain or shortness of breath.  He is followed by cardiology.  Review of Systems:  Review of Systems  Constitutional: Negative.   HENT: Negative.    Respiratory: Negative.    Cardiovascular:  Positive for leg swelling.  Gastrointestinal: Negative.   Genitourinary: Negative.   Musculoskeletal: Negative.   Neurological: Negative.   All other systems reviewed and are negative.   Past Medical History:  Diagnosis Date   Acquired deformity of nose    Calculus of kidney    Cervicalgia    Chronic heart failure with preserved ejection fraction (HFpEF) (HCC)    EF ~45-50%   Esophagitis, unspecified    Essential hypertension    Hyperlipidemia    Hypertrophy of prostate with urinary  obstruction and other lower urinary tract symptoms (LUTS)    Insomnia, unspecified    Internal hemorrhoids without mention of complication    Left carotid artery stenosis    S/P L CEA - 2015 (Dr. Trula Slade)   Multiple vessel coronary artery disease 2000   ~50% LM-ostLAD with 85% ost-mid LCx, 80% ost RI, 100% CTO RCA -> CABG X 4 (LIMA-LAD, RIMA-dRCA, SVG-OM2, SVG-RI) (Dr. Roxan Hockey) => 06/2019: STEMI : 100% SVG-OM2 (unable to open), 100% OM2; DES PCI LM-mLCx.    Obesity, unspecified    Old myocardial infarction 06/2019   True posterior STEMI -> occluded SVG.  Native LM--LCX PCI   Other and unspecified hyperlipidemia    Rhinophyma 06/05/2014   Unspecified malignant neoplasm of scalp and skin of neck    Unspecified malignant neoplasm of skin of ear and external auditory canal    Unspecified vitamin D deficiency    Urinary frequency    Past Surgical History:  Procedure Laterality Date   CAROTID ENDARTERECTOMY Left 06-14-14   CE   COLONOSCOPY  2007   Dr Lajoyce Corners, normal   CORONARY ARTERY BYPASS GRAFT  2000   Dr. Roxan Hockey - CABG x 4:  LIMA-LAD, RIMA-dRCA, SVG-OM2, SVG-RI   CORONARY STENT INTERVENTION N/A 06/22/2019   Procedure: CORONARY STENT INTERVENTION;  Surgeon: Leonie Man, MD;  Location: [facility]; unsuccessful attempt at revascularization of SVG-OM2 => Synergy DES PCI from LM-mid LCx (Synergy DES 2.5 mm x 38 mm--> 3.1-2.6 mm); unfortunately OM2 was occluded.  This revascularized OM1, OM 3 and OM 4.  CORONARY/GRAFT ACUTE MI REVASCULARIZATION N/A 06/22/2019   Procedure: CORONARY/GRAFT ACUTE MI REVASCULARIZATION;  Surgeon: Leonie Man, MD;  Location: [place] [facility];; unsuccessful PTCA/aspiration thrombectomy of SVG-OM1 as culprit lesion for STEMI   ENDARTERECTOMY Left 06/14/2014   Procedure: LEFT CAROTID ENDARTERECTOMY WITH VASCUGUARD PATCH ANGIOPLASTY;  Surgeon: Serafina Mitchell, MD;  Location: [place];  Service: Vascular;  Laterality: Left;   ESOPHAGOGASTRODUODENOSCOPY  ENDOSCOPY  2007   Dr Lajoyce Corners, with biopsy   EXCISIONAL [place] CATH AND CORONARY ANGIOGRAPHY  2000   MV CAD --> CABG x 4   LEFT HEART CATH AND CORS/GRAFTS ANGIOGRAPHY N/A 06/22/2019   Procedure: LEFT HEART CATH AND CORS/GRAFTS ANGIOGRAPHY;  Surgeon: Leonie Man, MD;  CULPRIT LESION = 100% SVG-OM2; Severe native CAD: mLM-Ost LAD-50% w/ Ost LCx 85% (DES PCI OM-OstLCx)- Treated b/c 100% TO SVG-OM2 as Culprit lesion - unable to open. OM2 100%. 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  GR 1 DD.  No obvious R WMA.  Essentially normal valves.  Mild MR.   trench in right side of jaw after abcesses  06/01/2016   Social History:   reports that he quit smoking about 48 years ago. His smoking use included cigarettes. He has a 50.00 pack-year smoking history. He has never been exposed to tobacco smoke. He has never used smokeless tobacco. He reports current alcohol use of about 4.0 standard drinks of alcohol per week. He reports that he does not use drugs.  Family History  Problem Relation Age of Onset   Cancer Mother        LIVER   Cancer Brother        PROSTATE   Cancer Daughter     Medications: Patient's Medications  New Prescriptions   No medications on file  Previous Medications   ALPHA-LIPOIC ACID 200 MG CAPS    Take 200 mg by mouth.   ASCORBIC ACID (VITAMIN C) 500 MG TABLET    Take 500-1,000 mg by mouth daily.   B COMPLEX VITAMINS TABLET    Take 1 tablet by mouth daily.   BOSWELLIA SERRATA (BOSWELLIA PO)    Take 800 mg by mouth.   CHOLECALCIFEROL (VITAMIN D3) 125 MCG (5000 UT) CAPS    Take 5,000 Units by mouth daily.    CINNAMON 500 MG TABS    Take 500 mg by mouth 3 (three) times a week.    CITRUS BERGAMOT PO    Take 1 capsule by mouth 2 (two) times a week.   CLOPIDOGREL (PLAVIX) 75 MG TABLET    TAKE 1 TABLET(75 MG) BY MOUTH DAILY   COENZYME Q10 (CO Q 10) 100 MG CAPS    Take 100 mg by mouth daily.    CYANOCOBALAMIN (VITAMIN B-12 PO)    Take 1 tablet by mouth daily.   DIAZEPAM (VALIUM) 5 MG TABLET    TAKE 1/2 TABLET(2.5 MG) BY MOUTH TWICE DAILY   DOXEPIN (SINEQUAN) 50 MG CAPSULE    Take 1 capsule (50 mg total) by mouth at bedtime.   EZETIMIBE (ZETIA) 10 MG TABLET    Take 1  tablet (10 mg total) by mouth daily.   FLUTICASONE-SALMETEROL (ADVAIR) 250-50 MCG/DOSE AEPB    Inhale 1 puff into the lungs once a week.    GRAPE SEED OIL    Take 1 capsule by mouth daily.   GREEN TEA, CAMELLIA SINENSIS, (GREEN TEA EXTRACT PO)    Take 1 capsule by mouth daily.   METOPROLOL SUCCINATE (TOPROL-XL) 50 MG 24 HR TABLET    Take one tablet by mouth once daily. Take with or immediately following a meal.   NITROGLYCERIN (NITROSTAT) 0.4 MG SL TABLET    Place 1 tablet (0.4 mg total) under the tongue every 5 (five) minutes as needed.   RAMIPRIL (ALTACE) 10 MG CAPSULE    TAKE 1 CAPSULE BY MOUTH EVERY DAY   RESVERATROL 100 MG CAPS    Take 100 mg by mouth daily.    TURMERIC PO    Take 1 capsule by mouth as needed.    UNABLE TO FIND    Take 1 capsule by mouth See admin instructions. Swanson Mediterranean Nutrient capsules: Take 1 capsule by mouth once a day   ZINC GLUCONATE 50 MG TABLET    Take 50 mg by mouth daily.  Modified Medications   Modified Medication Previous Medication   ROSUVASTATIN (CRESTOR) 40 MG TABLET rosuvastatin (CRESTOR) 40 MG tablet      TAKE 1 TABLET BY MOUTH EVERY MONDAY, WEDNESDAY, AND FRIDAY    TAKE 1 TABLET BY MOUTH EVERY MONDAY, WEDNESDAY, AND FRIDAY  Discontinued Medications   No medications on file    Physical Exam:  Vitals:   04/14/22 1319  BP: 128/70  Pulse: 76  Temp: 97.9 F (36.6 C)  SpO2: 98%   Weight: 230 lb (104.3 kg)  Height: '5\' 10"'$  (1.778 m)   Body mass index is 33 kg/m. Wt Readings from Last 3 Encounters:  04/14/22 230 lb (104.3 kg)  11/12/21 227 lb (103 kg)  11/09/21 227 lb (103 kg)    Physical Exam Vitals and nursing note reviewed.  Constitutional:      Appearance: Normal appearance.  Cardiovascular:     Rate and Rhythm: Normal rate and regular rhythm.  Pulmonary:     Effort: Pulmonary effort is normal.  Chest:     Chest wall: No tenderness.  Musculoskeletal:        General: Normal range of motion.     Cervical back: Neck supple.     Right lower leg: Edema present.     Left lower leg: Edema present.     Comments: Swelling is 2+ in his ankles.  Pulses are good.  Neurological:     General: No focal deficit present.     Mental Status: He is alert and oriented to person, place, and time.     Labs reviewed: Basic Metabolic Panel: Recent Labs    05/19/21 1426  NA 139  K 4.1  CL 104  CO2 30  GLUCOSE 93  BUN 13  CREATININE 1.01  CALCIUM 9.5   Liver Function Tests: Recent Labs    05/19/21 1426  AST 20  ALT 16  BILITOT 0.6  PROT 6.9   No results for input(s): "LIPASE", "AMYLASE" in the last 8760 hours. No results for input(s): "AMMONIA" in the last 8760 hours. CBC: No results for input(s): "WBC", "NEUTROABS", "HGB", "HCT", "MCV", "PLT" in the last 8760 hours. Lipid Panel: Recent Labs    05/19/21 1426  CHOL 135  HDL 36*  LDLCALC 70  TRIG 235*  CHOLHDL 3.8  TSH: No results for input(s): "TSH" in the last 8760 hours. A1C: Lab Results  Component Value Date   HGBA1C 5.7 (H) 05/26/2020     Assessment/Plan  1. Prediabetes Patient not really having symptoms but A1c done 1 year ago was at upper limits of normal.  Patient is not fasting today but I explained that we could use A1c to help rule out diabetes  2. Bilateral lower extremity edema Probably related to some venous insufficiency.  Problem is bilateral but pulses are good.  We  talked about prevention such as elevation of feet when is seated cutting back on salt weight loss and walking.  Will add low-dose diuretic  3. Coronary artery disease involving native coronary artery of native heart with angina pectoris ([employer]) Asymptomatic; followed infrequently by cardiologist  4. Essential hypertension Blood pressure is good at 128/70.  Medications include ramipril and metoprolol   Alain Honey, MD [place] [phone]

## 2022-04-15 LAB — HEMOGLOBIN A1C
Hgb A1c MFr Bld: 5.8 % of total Hgb — ABNORMAL HIGH (ref <5.7)
Mean Plasma Glucose: 120 mg/dL
eAG (mmol/L): 6.6 mmol/L

## 2022-04-16 ENCOUNTER — Other Ambulatory Visit: Payer: Self-pay

## 2022-04-16 MED ORDER — DIAZEPAM 5 MG PO TABS: ORAL_TABLET | ORAL | 1 refills | Status: DC

## 2022-04-27 ENCOUNTER — Other Ambulatory Visit: Payer: Self-pay | Admitting: Cardiology

## 2022-07-14 ENCOUNTER — Other Ambulatory Visit: Payer: Self-pay | Admitting: Family

## 2022-07-14 NOTE — Telephone Encounter (Signed)
Patient is requesting a refill of the following medications: Requested Prescriptions   Pending Prescriptions Disp Refills   diazepam (VALIUM) 5 MG tablet [Pharmacy Med Name: DIAZEPAM '5MG'$  TABLETS] 30 tablet 2    Sig: TAKE 1/2 TABLET(2.5 MG) BY MOUTH TWICE DAILY    Date of last refill: 04/16/22  Refill amount: 30/1  Treatment agreement date: Not on file, notation made on next appointment 10/27/22 with Dr.Miller

## 2022-08-04 ENCOUNTER — Other Ambulatory Visit: Payer: Self-pay | Admitting: Family Medicine

## 2022-08-04 DIAGNOSIS — R351 Nocturia: Secondary | ICD-10-CM

## 2022-08-04 MED ORDER — TAMSULOSIN HCL 0.4 MG PO CAPS: 0.4000 mg | ORAL_CAPSULE | Freq: Every day | ORAL | 3 refills | Status: DC

## 2022-10-05 ENCOUNTER — Other Ambulatory Visit: Payer: Self-pay

## 2022-10-05 DIAGNOSIS — I10 Essential (primary) hypertension: Secondary | ICD-10-CM

## 2022-10-05 MED ORDER — RAMIPRIL 10 MG PO CAPS
ORAL_CAPSULE | ORAL | 1 refills | Status: DC
Start: 2022-10-05 — End: 2023-04-07

## 2022-10-11 ENCOUNTER — Other Ambulatory Visit: Payer: Self-pay

## 2022-10-11 NOTE — Telephone Encounter (Signed)
High warning came up when trying ti refill medication.  Medication pended and sent to Dr. Jacalyn Lefevre

## 2022-10-12 ENCOUNTER — Telehealth: Payer: Self-pay | Admitting: Cardiology

## 2022-10-12 MED ORDER — DOXEPIN HCL 50 MG PO CAPS: 50.0000 mg | ORAL_CAPSULE | Freq: Every day | ORAL | 1 refills | Status: DC

## 2022-10-12 NOTE — Telephone Encounter (Signed)
   Pre-operative Risk Assessment    Patient Name: Alex [name]  DOB: 1931-05-17 MRN: [id number]     Request for Surgical Clearance    Procedure:  Dental Extraction - Amount of Teeth to be Pulled:  3 extractions and a root canal   Date of Surgery:  Clearance 10/12/22                                 Surgeon:  Dr. Haroldine Laws  Surgeon's Group or Practice Name:  [facility]  Phone number:  [phone] Fax number:  [phone]   Type of Clearance Requested:   - Medical  - Pharmacy:  Hold Clopidogrel (Plavix)     Type of Anesthesia:  Local    Additional requests/questions:    Signed, April Henson   10/12/2022, 2:27 PM

## 2022-10-12 NOTE — Telephone Encounter (Signed)
Dental office called in today with pt in the chair asking if pt could have 3 teeth extracted and a root canal. 2 teeth are surgical extractions and 1 is simple extraction as well as a root canal. I d/w the pre op APP today Jari Favre, PAC:   Tessa: so they have not been seen since 2022 which is a problem bc they are asking for MEDICAL clearance as well as pharmacy clearance (with the plavix) that means they will need an in-person office visit.   Me: I explained this to the DDS office as we are unable to clear the pt today and that he is going to need an appt in the office since we have not seen him since 2022. Gave our main Ph# [phone] to give the pt to call and schedule an appt in office for pre op clearance.   Chasity, at the DDS verbalized understanding to our directions. I will fax this note over only as FYI.   Though we have entered the clearance information into the chart today, would be best if DDS did fax over another request when they are ready to reschedule, again the pt will need in office before he can be cleared.

## 2022-10-12 NOTE — Telephone Encounter (Signed)
   Name: AARIK BLANK  DOB: 1931/12/09  MRN: [id number]  Primary Cardiologist: Bryan Lemma, MD  Chart reviewed as part of pre-operative protocol coverage. Because of Tavarion Babington Hester's past medical history and time since last visit, he will require a follow-up in-office visit in order to better assess preoperative cardiovascular risk.  Pre-op covering staff: - Please schedule appointment and call patient to inform them. If patient already had an upcoming appointment within acceptable timeframe, please add "pre-op clearance" to the appointment notes so provider is aware. - Please contact requesting surgeon's office via preferred method (i.e, phone, fax) to inform them of need for appointment prior to surgery.  At this point it is okay for him to hold Plavix as needed for surgical procedure 5 days preop (Dr. Herbie Baltimore)  Sharlene Dory, PA-C  10/12/2022, 2:38 PM

## 2022-10-14 ENCOUNTER — Telehealth: Payer: Self-pay

## 2022-10-14 NOTE — Telephone Encounter (Signed)
Pt should be on 75 mg

## 2022-10-14 NOTE — Telephone Encounter (Signed)
Patient called requesting refill on doxepin. Doxepin 50 mg sent to pharmacy on 10/12/22. Patient states that he is on 75mg ,but not on active med list. Patient states that he was taking 50 mg then he switched back to taking 75mg . Please advise as to what patient is suppose to be taking.  Message sent to Richarda Blade, NP(covering provider) and  PCP Dr. Jacalyn Lefevre

## 2022-10-14 NOTE — Telephone Encounter (Signed)
Will need to verify dosage with Dr.Miller.Can refill 50 mg tablet for now.

## 2022-10-15 ENCOUNTER — Encounter: Payer: Medicare HMO | Admitting: Family

## 2022-10-15 ENCOUNTER — Encounter: Payer: Self-pay | Admitting: Family

## 2022-10-15 NOTE — Addendum Note (Signed)
Addended by: Elveria Royals on: 10/15/2022 02:48 PM   Modules accepted: Orders

## 2022-10-15 NOTE — Telephone Encounter (Signed)
Critical dose warning when attempting refill unable to refill.

## 2022-10-15 NOTE — Telephone Encounter (Deleted)
Critical dose warning came up when trying to fill medications.  Medication pended and sent to Richarda Blade, NP

## 2022-10-15 NOTE — Progress Notes (Signed)
This encounter was created in error - please disregard.

## 2022-10-15 NOTE — Telephone Encounter (Signed)
Critical dose warning came up when trying to refill medication.  Medication pended and sent to [facility], NP(covering provider)  and Dr. Jacalyn Lefevre.

## 2022-10-19 ENCOUNTER — Other Ambulatory Visit: Payer: Self-pay | Admitting: Family Medicine

## 2022-10-19 NOTE — Telephone Encounter (Signed)
Patient called stating he needs his doxepin filled at 75 mg URGENTLY. Patient expressed that the pharmacy has made several unsuccessful attempts to get this refilled with [place].

## 2022-10-19 NOTE — Addendum Note (Signed)
Addended by: Elveria Royals on: 10/19/2022 09:17 AM   Modules accepted: Orders

## 2022-10-20 ENCOUNTER — Ambulatory Visit: Payer: Medicare HMO | Attending: Physician Assistant | Admitting: Physician Assistant

## 2022-10-20 ENCOUNTER — Encounter: Payer: Self-pay | Admitting: Physician Assistant

## 2022-10-20 VITALS — BP 126/62 | HR 75 | Ht 70.0 in | Wt 230.0 lb

## 2022-10-20 DIAGNOSIS — I10 Essential (primary) hypertension: Secondary | ICD-10-CM

## 2022-10-20 DIAGNOSIS — E785 Hyperlipidemia, unspecified: Secondary | ICD-10-CM | POA: Diagnosis not present

## 2022-10-20 DIAGNOSIS — I25810 Atherosclerosis of coronary artery bypass graft(s) without angina pectoris: Secondary | ICD-10-CM

## 2022-10-20 DIAGNOSIS — I6522 Occlusion and stenosis of left carotid artery: Secondary | ICD-10-CM | POA: Diagnosis not present

## 2022-10-20 DIAGNOSIS — Z01810 Encounter for preprocedural cardiovascular examination: Secondary | ICD-10-CM | POA: Diagnosis not present

## 2022-10-20 DIAGNOSIS — I2129 ST elevation (STEMI) myocardial infarction involving other sites: Secondary | ICD-10-CM

## 2022-10-20 MED ORDER — DOXEPIN HCL 75 MG PO CAPS: 75.0000 mg | ORAL_CAPSULE | Freq: Every day | ORAL | 1 refills | Status: DC

## 2022-10-20 NOTE — Addendum Note (Signed)
Addended by: Frederica Kuster on: 10/20/2022 04:59 PM   Modules accepted: Orders

## 2022-10-20 NOTE — Telephone Encounter (Signed)
Patient called to say he spoke with Dr.Miller yesterday, however he still did not send his rx for Doxepin 75 mg to the pharmacy.   High risk or very high risk warning populated when attempting to refill medication. RX request sent to PCP for review and approval if warranted.

## 2022-10-20 NOTE — Progress Notes (Signed)
Cardiology Office Note:  .   Date:  10/22/2022  ID:  Alex [name], DOB [birth year], MRN [id number] PCP: Frederica Kuster, MD  [facility] Providers Cardiologist:  Bryan Lemma, MD     History of Present Illness: .   Alex [name] is a 89 y.o. male with past medical history of CAD s/p CABG x 4 in 2000 (LIMA-LAD, RIMA-distal RCA, SVG-OM2, SVG-RI), hypertension, hyperlipidemia, carotid artery stenosis s/p left CEA, chronic bilateral lower extremity edema and ischemic cardiomyopathy.  He had left CEA for carotid artery disease in April 2016.  Last cardiac catheterization was performed in February 2021 in the setting of posterior STEMI, SVG to OM was occluded, 3 out of 4 grafts were patent.  Echocardiogram obtained on 06/23/2019 showed EF 45 to 50%, no regional wall motion abnormality, mild MR, trivial AI.  He was last seen by Dr. Herbie Baltimore in March 2022 at which time he was doing well.  His Zetia was added to his medical regimen took help control cholesterol better.  Patient presents today for follow-up.  He denies any chest pain or worsening dyspnea.  He has no lower extremity edema, orthopnea or PND.  She requires upcoming dental procedure.  Request was for 3 dental extractions plus 1-2 root canal surgery.  He is at acceptable risk to proceed from the cardiac perspective.  Normally, we recommend to proceed with 1-2 teeth extractions without holding Plavix, however if bleeding risk is excessive, may hold Plavix for 5 days prior to the procedure and restart as soon as possible afterward at the surgeon's discretion.  ROS:   He denies chest pain, palpitations, dyspnea, pnd, orthopnea, n, v, dizziness, syncope, edema, weight gain, or early satiety. All other systems reviewed and are otherwise negative except as noted above.    Studies Reviewed: Marland Kitchen   EKG Interpretation  Date/Time:  Wednesday October 20 2022 13:42:06 EDT Ventricular Rate:  74 PR Interval:  188 QRS Duration: 102 QT Interval:  404 QTC  Calculation: 448 R Axis:   16 Text Interpretation: Normal sinus rhythm Inferior infarct (cited on or before 25-Jun-2019) When compared with ECG of 25-Jun-2019 06:57, No significant change was found Confirmed by Azalee Course [phone]) on 10/20/2022 2:32:36 PM    Cardiac Studies & Procedures   CARDIAC CATHETERIZATION  CARDIAC CATHETERIZATION 07/17/2019  Narrative Images from the original result were not included.   CULPRIT LESION: SVG-OM2 graft was visualized by angiography and is normal in caliber. The graft exhibits severe diffuse disease. Origin to Mid Graft lesion is 100% stenosed.  Aspiration Thrombectomy & Balloon angioplasty was performed using a BALLOON SAPPHIRE 2.5X20. -- Unable to restore flow (PCI aborted, attention turned to LM-LCx)  Post intervention, there is a 100% residual stenosis.  NATIVE LESION: Mid LM to Ost LAD lesion is 50% stenosed with 85% stenosed side branch in Ost Cx to Mid Cx.  A drug-eluting stent was successfully placed from LM-LCx using a SYNERGY XD 2.50X38. tapered post-dilation 3.1-2.6 mm  Post intervention, there is a 0% residual stenosis from LM-LCx. 2nd Mrg lesion is now 100% stenosed -TIMI 0 flow (had been 90% pre-PCI with TIMI I flow) .  ------  Ost LAD to Mid LAD lesion is 60% stenosed with 50% stenosed side branch in 1st Diag. Mid LAD lesion is 100% stenosed.  Ramus lesion is 80% stenosed.  LIMA-LAD graft was visualized by angiography and is normal in caliber. The graft exhibits no disease.  SVG-RI graft was visualized by angiography and is normal in  caliber. The graft exhibits no disease.  ------  Mid RCA to Dist RCA lesion is 100% stenosed.  RIMA-dRCA graft was visualized by angiography and is normal in caliber. The graft exhibits no disease.  -------------------------------------------------  There is mild left ventricular systolic dysfunction. The left ventricular ejection fraction is 45-50% by visual estimate.  There is mild (2+) mitral  regurgitation.  LV end diastolic pressure is moderately elevated.  2nd Mrg lesion is 100% stenosed.  A drug-eluting stent was successfully placed using a SYNERGY XD 2.50X38.  A stent was successfully placed.  SUMMARY  Culprit lesion is occluded SVG-OM2--unable to open despite extensive angioplasty, aspiration thrombectomy and intravenous adenosine  Ostial-proximal LCx diffuse 80% -> successful DES PCI of LEFT MAIN into LCx using Synergy DES 2.5 mm 38 mm postdilated to 3.2 mm proximally and 2.6 mm distal.  Subtotal occluded LAD with patent pedicle LIMA-LAD  100% occluded mid RCA with patent pedicle RIMA-dRCA  Moderately elevated LVEDP with low normal LVEF (difficult to assess wall motion)   RECOMMENDATION  Admit to CCU for overnight monitoring.  Start high-dose high intensity statin  Continue beta-blocker, hold ACE inhibitor until at least tomorrow to reassess blood pressures.  With likely completion of his existing MI, would not consider fast-track discharge.   Bryan Lemma, M.D., M.S. Interventional Cardiologist  Findings Coronary Findings Diagnostic  Dominance: Right  Left Main Mid LM to Ost LAD lesion is 50% stenosed with 85% stenosed side branch in Ost Cx to Mid Cx. The lesion is located at the bifurcation, segmental and irregular. The lesion is mildly calcified. Likely related to original disease prior to CABG  Left Anterior Descending Vessel is moderate in size. Ost LAD to Mid LAD lesion is 60% stenosed with 50% stenosed side branch in 1st Diag. The lesion is calcified. Mid LAD lesion is 100% stenosed. The lesion is chronically occluded. The lesion is calcified.  First Diagonal Branch Vessel is small in size.  Second Diagonal Branch Vessel is small in size.  Third Diagonal Branch Vessel is small in size.  Ramus Intermedius Ramus lesion is 80% stenosed.  Left Circumflex Vessel is moderate in size.  First Obtuse Marginal Branch Vessel is small in  size.  Second Obtuse Marginal Branch Vessel is small in size. 2nd Mrg lesion is 100% stenosed. intial pre-stent 90% - post stent 100%  TIMI I flow pre-PCI, TIMI 0 flow post -&gt; vessel was too small the ostium to successfully wire.  Third Obtuse Marginal Branch Vessel is small in size.  Right Coronary Artery There is moderate diffuse disease throughout the vessel. Mid RCA to Dist RCA lesion is 100% stenosed.  Acute Marginal Branch Vessel is small in size.  Right Ventricular Branch Vessel is small in size.  Right Posterior Descending Artery Vessel is moderate in size.  Right Posterior Atrioventricular Artery Vessel is small in size.  First Right Posterolateral Branch Vessel is small in size.  Second Right Posterolateral Branch Vessel is small in size.  Saphenous Graft To 2nd Mrg SVG graft was visualized by angiography and is normal in caliber. SVG-OM2 The graft exhibits severe diffuse disease. Origin to Mid Graft lesion is 100% stenosed. Vessel is the culprit lesion. The lesion is irregular and heavily thrombotic.  Saphenous Graft To Ramus SVG graft was visualized by angiography and is normal in caliber. SVG-RI The graft exhibits no disease.  RIMA RIMA Graft To Dist RCA RIMA graft was visualized by angiography and is normal in caliber. The graft exhibits no disease. Extremely tortuous  R SubClavian Artery  LIMA LIMA Graft To Dist LAD LIMA graft was visualized by angiography and is normal in caliber. The graft exhibits no disease.  Intervention  Mid LM to Ost LAD lesion with side branch in Ost Cx to Mid Cx Stent - Main Branch Lesion length:  38 mm. CATH VISTA GUIDE 57FR XB3.5 guide catheter was inserted. Lesion crossed with guidewire using a WIRE ASAHI PROWATER 180CM. Pre-stent angioplasty was performed using a BALLOON SAPPHIRE 2.5X20. Maximum pressure:  12 atm. Inflation time:  20 sec. A drug-eluting stent was successfully placed using a SYNERGY XD 2.50X38. Maximum  pressure: 14 atm. Inflation time: 30 sec. Minimum lumen area:  2.8 mm. Stent strut is well apposed. LM-ostial LCx post-dilated to 3.1 mm Distal stent 2.6 mm with stent balloon. Post-stent angioplasty was performed using a BALLOON SAPPHIRE [place] 3.0X18. Maximum pressure:  16 atm. Inflation time:  20 sec. Stent - Side Branch A stent was successfully placed. Post-Intervention Lesion Assessment The intervention was successful. Pre-interventional TIMI flow is 3. Post-intervention TIMI flow is 3. There is a 0% residual stenosis in the main branch post intervention. There is a 0% residual stenosis in the side branch post intervention.  Origin to Mid Graft lesion (Saphenous Graft To 2nd Mrg) Thrombectomy CATH VISTA GUIDE 57FR JR4 guide catheter was inserted. WIRE ASAHI PROWATER 180CM guidewire was used to cross lesion. Aspiration thrombectomy performed using a CATH EXTRAC PRONTO LP 57F RND. 4 passes taken. Some clot removed - but no benefit. IC Adenosine 30 mcg x 2 &amp; IC NTG 200 mcg x 1 - still no flow. Angioplasty Lesion length:  60 mm. WIRE ASAHI PROWATER 180CM guidewire used to cross lesion. Balloon angioplasty was performed using a BALLOON SAPPHIRE 2.5X20. Maximum pressure: 10 atm. Inflation time: 20 sec. Multiple inflations throughout the entire graft at varying levels of Atm. Unable to restore flow Post-Intervention Lesion Assessment The intervention was unsuccessful. Too heavy thrombus/grummus load &amp; very small caliber target OM Pre-interventional TIMI flow is 0. Post-intervention TIMI flow is 0. Treated lesion length:  60 mm. No-reflow occurred during the intervention. IC adenosine and nitroglycerin was given. There is a 100% residual stenosis post intervention.   STRESS TESTS  NM MYOCAR MULTI W/SPECT W 11/13/2012   ECHOCARDIOGRAM  ECHOCARDIOGRAM COMPLETE 06/23/2019  Narrative ECHOCARDIOGRAM REPORT    Patient Name:   Alex [name] Date of Exam: 06/23/2019 Medical Rec #:  [id number]    Height:       70.0 in Accession #:    [id number]  Weight:       222.9 lb Date of Birth:  1932-03-07   BSA:          2.186 m Patient Age:    89 years    BP:           146/81 mmHg Patient Gender: M           HR:           76 bpm. Exam Location:  Inpatient  Procedure: 2D Echo, Cardiac Doppler, Color Doppler and Intracardiac Opacification Agent  Indications:    STEMI 121.3  History:        Patient has no prior history of Echocardiogram examinations.  Sonographer:    Roosvelt Maser RDCS Referring Phys: [id number] SAMUEL G MCDOWELL  IMPRESSIONS   1. Left ventricular ejection fraction, by estimation, is 45 to 50%. The left ventricle has mildly decreased function. The left ventricle has no regional wall motion abnormalities. Left ventricular diastolic parameters are  consistent with Grade I diastolic dysfunction (impaired relaxation). 2. Right ventricular systolic function is normal. The right ventricular size is normal. 3. The mitral valve is normal in structure and function. Mild mitral valve regurgitation. No evidence of mitral stenosis. 4. The aortic valve is normal in structure and function. Aortic valve regurgitation is trivial. No aortic stenosis is present. 5. The inferior vena cava is normal in size with greater than 50% respiratory variability, suggesting right atrial pressure of 3 mmHg.  FINDINGS Left Ventricle: Left ventricular ejection fraction, by estimation, is 45 to 50%. The left ventricle has mildly decreased function. The left ventricle has no regional wall motion abnormalities. Definity contrast agent was given IV to delineate the left ventricular endocardial borders. The left ventricular internal cavity size was normal in size. There is no left ventricular hypertrophy. Left ventricular diastolic parameters are consistent with Grade I diastolic dysfunction (impaired relaxation).   LV Wall Scoring: The posterior wall and basal inferior segment are akinetic.  Right Ventricle: The  right ventricular size is normal. No increase in right ventricular wall thickness. Right ventricular systolic function is normal.  Left Atrium: Left atrial size was normal in size.  Right Atrium: Right atrial size was normal in size.  Pericardium: There is no evidence of pericardial effusion.  Mitral Valve: The mitral valve is normal in structure and function. Normal mobility of the mitral valve leaflets. Mild mitral valve regurgitation. No evidence of mitral valve stenosis.  Tricuspid Valve: The tricuspid valve is normal in structure. Tricuspid valve regurgitation is not demonstrated. No evidence of tricuspid stenosis.  Aortic Valve: The aortic valve is normal in structure and function. Aortic valve regurgitation is trivial. No aortic stenosis is present.  Pulmonic Valve: The pulmonic valve was normal in structure. Pulmonic valve regurgitation is not visualized. No evidence of pulmonic stenosis.  Aorta: The aortic root is normal in size and structure.  Venous: The inferior vena cava is normal in size with greater than 50% respiratory variability, suggesting right atrial pressure of 3 mmHg.  IAS/Shunts: No atrial level shunt detected by color flow Doppler.  Additional Comments: EF correlates with catheterization.   LEFT VENTRICLE PLAX 2D LVIDd:         4.40 cm LVIDs:         3.36 cm LV PW:         1.69 cm LV IVS:        1.60 cm  LV Volumes (MOD) LV vol d, MOD A4C: 89.8 ml LV vol s, MOD A4C: 46.2 ml LV SV MOD A4C:     89.8 ml  RIGHT VENTRICLE          IVC RV Basal diam:  3.81 cm  IVC diam: 1.85 cm  LEFT ATRIUM           Index       RIGHT ATRIUM           Index LA diam:      5.10 cm 2.33 cm/m  RA Area:     15.70 cm LA Vol (A4C): 68.5 ml 31.34 ml/m RA Volume:   31.50 ml  14.41 ml/m AORTIC VALVE LVOT Vmax:   89.60 cm/s LVOT Vmean:  51.700 cm/s LVOT VTI:    0.179 m  AORTA Ao Root diam: 3.40 cm  MITRAL VALVE MV Area (PHT): 4.68 cm    SHUNTS MV Decel Time: 162 msec     Systemic VTI: 0.18 m MV E velocity: 66.30 cm/s MV A velocity: 76.60 cm/s MV  E/A ratio:  0.87  Donato Schultz MD Electronically signed by Donato Schultz MD Signature Date/Time: 06/23/2019/5:17:11 PM    Final             Risk Assessment/Calculations:            Physical Exam:   VS:  BP 126/62 (BP Location: Left Arm, Patient Position: Sitting, Cuff Size: Normal)   Pulse 75   Ht 5\' 10"  (1.778 m)   Wt 230 lb (104.3 kg)   SpO2 95%   BMI 33.00 kg/m    Wt Readings from Last 3 Encounters:  10/20/22 230 lb (104.3 kg)  10/15/22 230 lb (104.3 kg)  04/14/22 230 lb (104.3 kg)    GEN: Well nourished, well developed in no acute distress NECK: No JVD; No carotid bruits CARDIAC: RRR, no murmurs, rubs, gallops RESPIRATORY:  Clear to auscultation without rales, wheezing or rhonchi  ABDOMEN: Soft, non-tender, non-distended EXTREMITIES:  No edema; No deformity   ASSESSMENT AND PLAN: .    CAD: Denies any chest pain.  On Plavix  Carotid artery disease: History of left CEA  Hypertension: Blood pressure stable  Hyperlipidemia: On Zetia and Crestor  Preoperative clearance: Patient is at acceptable risk to proceed with dental procedure.  We generally do not require the patient to hold Plavix for 1-2 teeth extractions, however patient require 3 teeth extraction, if bleeding risk is high, he may hold Plavix for 5 days prior to the procedure and restart as soon as possible afterward at the surgeon's discretion.  He does not require SBE prophylaxis.       Dispo: Follow-up in 12 month  Signed, Azalee Course, [place]

## 2022-10-20 NOTE — Addendum Note (Signed)
Addended by: Maurice Small on: 10/20/2022 02:43 PM   Modules accepted: Orders

## 2022-10-20 NOTE — Telephone Encounter (Signed)
Doxepin again sent to pharmacy, 75 mg

## 2022-10-20 NOTE — Patient Instructions (Signed)
Medication Instructions:  The current medical regimen is effective;  continue present plan and medications as directed. Please refer to the Current Medication list given to you today.  *If you need a refill on your cardiac medications before your next appointment, please call your pharmacy*  Lab Work: WITH PRIMARY MD AT THIS APPOINTMENT If you have labs (blood work) drawn today and your tests are completely normal, you will receive your results only by: MyChart Message (if you have MyChart) OR A paper copy in the mail If you have any lab test that is abnormal or we need to change your treatment, we will call you to review the results.  Testing/Procedures: NONE  Follow-Up: At [facility], you and your health needs are our priority.  As part of our continuing mission to provide you with exceptional heart care, we have created designated Provider Care Teams.  These Care Teams include your primary Cardiologist (physician) and Advanced Practice Providers (APPs -  Physician Assistants and Nurse Practitioners) who all work together to provide you with the care you need, when you need it.  We recommend signing up for the patient portal called "MyChart".  Sign up information is provided on this After Visit Summary.  MyChart is used to connect with patients for Virtual Visits (Telemedicine).  Patients are able to view lab/test results, encounter notes, upcoming appointments, etc.  Non-urgent messages can be sent to your provider as well.   To learn more about what you can do with MyChart, go to [email or web address].    Your next appointment:   12 month(s)  Provider:   Bryan Lemma, MD     Other Instructions OK FOR DENTAL PROCEDURE

## 2022-10-27 ENCOUNTER — Ambulatory Visit (INDEPENDENT_AMBULATORY_CARE_PROVIDER_SITE_OTHER): Payer: Medicare HMO | Admitting: Family Medicine

## 2022-10-27 ENCOUNTER — Encounter: Payer: Self-pay | Admitting: Family Medicine

## 2022-10-27 VITALS — BP 128/88 | HR 66 | Temp 97.9°F | Resp 18 | Ht 70.0 in | Wt 230.2 lb

## 2022-10-27 DIAGNOSIS — I10 Essential (primary) hypertension: Secondary | ICD-10-CM

## 2022-10-27 DIAGNOSIS — R7303 Prediabetes: Secondary | ICD-10-CM

## 2022-10-27 DIAGNOSIS — E785 Hyperlipidemia, unspecified: Secondary | ICD-10-CM | POA: Diagnosis not present

## 2022-10-27 DIAGNOSIS — R600 Localized edema: Secondary | ICD-10-CM

## 2022-10-27 NOTE — Progress Notes (Signed)
Provider:  Jacalyn Lefevre, MD  Careteam: Patient Care Team: Frederica Kuster, MD as PCP - General (Family Medicine) Marykay Lex, MD as PCP - Cardiology (Cardiology) Runell Gess, MD as Consulting Physician (Cardiology) Nada Libman, MD as Consulting Physician (Vascular Surgery) Mckinley Jewel, MD as Consulting Physician (Ophthalmology) Alfredo Martinez, MD as Consulting Physician (Urology) Mckinley Jewel, MD as Consulting Physician (Ophthalmology)  PLACE OF SERVICE:  [facility]  Advanced Directive information Does Patient Have a Medical Advance Directive?: No, Would patient like information on creating a medical advance directive?: No - Patient declined  No Known Allergies  Chief Complaint  Patient presents with   Follow-up    Six Month Follow-up and sign treatment agreement   Quality Metric Gaps    Needs to discuss Pneumonia vaccine, Covid vaccine and Medicare Annual Wellness Visit     HPI: Patient is a 89 y.o. male for medical management of chronic problems including coronary artery disease, hyperlipidemia, insomnia, and anxiety. He generally has been doing fairly well for his age.  He still is involved in the care of a and illiterate patient name Molly Maduro who he helps with bills and feels.  He continues to take ramipril metoprolol for blood pressure and heart.  He takes rosuvastatin for lipids as well as Zetia. We recently had some problems getting his Sinequan refilled.  He had formally been on 75 mg which was reduced to 50.  He was unable to sleep with 50 finally got increased to 75 and he is doing well sleeps well most of the nights.  Review of Systems:  Review of Systems  Constitutional:  Positive for malaise/fatigue.  HENT: Negative.    Respiratory: Negative.    Cardiovascular: Negative.   Gastrointestinal: Negative.   Genitourinary:  Positive for frequency.  Neurological: Negative.   Psychiatric/Behavioral:  The patient is nervous/anxious.      Past Medical History:  Diagnosis Date   Acquired deformity of nose    Calculus of kidney    Cervicalgia    Chronic heart failure with preserved ejection fraction (HFpEF) (HCC)    EF ~45-50%   Esophagitis, unspecified    Essential hypertension    Hyperlipidemia    Hypertrophy of prostate with urinary obstruction and other lower urinary tract symptoms (LUTS)    Insomnia, unspecified    Internal hemorrhoids without mention of complication    Left carotid artery stenosis    S/P L CEA - 2015 (Dr. Myra Gianotti)   Multiple vessel coronary artery disease 2000   ~50% LM-ostLAD with 85% ost-mid LCx, 80% ost RI, 100% CTO RCA -> CABG X 4 (LIMA-LAD, RIMA-dRCA, SVG-OM2, SVG-RI) (Dr. Dorris Fetch) => 06/2019: STEMI : 100% SVG-OM2 (unable to open), 100% OM2; DES PCI LM-mLCx.    Obesity, unspecified    Old myocardial infarction 06/2019   True posterior STEMI -> occluded SVG.  Native LM--LCX PCI   Other and unspecified hyperlipidemia    Rhinophyma 06/05/2014   Unspecified malignant neoplasm of scalp and skin of neck    Unspecified malignant neoplasm of skin of ear and external auditory canal    Unspecified vitamin D deficiency    Urinary frequency    Past Surgical History:  Procedure Laterality Date   CAROTID ENDARTERECTOMY Left 06-14-14   CE   COLONOSCOPY  2007   Dr Virginia Rochester, normal   CORONARY ARTERY BYPASS GRAFT  2000   Dr. Dorris Fetch - CABG x 4:  LIMA-LAD, RIMA-dRCA, SVG-OM2, SVG-RI   CORONARY STENT INTERVENTION N/A 06/22/2019  Procedure: CORONARY STENT INTERVENTION;  Surgeon: Marykay Lex, MD;  Location[facility] INVASIVE CV LAB; unsuccessful attempt at revascularization of SVG-OM2 => Synergy DES PCI from LM-mid LCx (Synergy DES 2.5 mm x 38 mm--> 3.1-2.6 mm); unfortunately OM2 was occluded.  This revascularized OM1, OM 3 and OM 4.   CORONARY/GRAFT ACUTE MI REVASCULARIZATION N/A 06/22/2019   Procedure: CORONARY/GRAFT ACUTE MI REVASCULARIZATION;  Surgeon: Marykay Lex, MD;  Location: [facility] INVASIVE CV  LAB;; unsuccessful PTCA/aspiration thrombectomy of SVG-OM1 as culprit lesion for STEMI   ENDARTERECTOMY Left 06/14/2014   Procedure: LEFT CAROTID ENDARTERECTOMY WITH VASCUGUARD PATCH ANGIOPLASTY;  Surgeon: Nada Libman, MD;  Location: [facility] OR;  Service: Vascular;  Laterality: Left;   ESOPHAGOGASTRODUODENOSCOPY ENDOSCOPY  2007   Dr Virginia Rochester, with biopsy   EXCISIONAL HEMORRHOIDECTOMY  1981   LEFT HEART CATH AND CORONARY ANGIOGRAPHY  2000   MV CAD --> CABG x 4   LEFT HEART CATH AND CORS/GRAFTS ANGIOGRAPHY N/A 06/22/2019   Procedure: LEFT HEART CATH AND CORS/GRAFTS ANGIOGRAPHY;  Surgeon: Marykay Lex, MD;  CULPRIT LESION = 100% SVG-OM2; Severe native CAD: mLM-Ost LAD-50% w/ Ost LCx 85% (DES PCI OM-OstLCx)- Treated b/c 100% TO SVG-OM2 as Culprit lesion - unable to open. OM2 100%. 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  GR 1 DD.  No obvious R WMA.  Essentially normal valves.  Mild MR.   trench in right side of jaw after abcesses  06/01/2016   Social History:   reports that he quit smoking about 49 years ago. His smoking use included cigarettes. He has a 50.00 pack-year smoking history. He has never been exposed to tobacco smoke. He has never used smokeless tobacco. He reports current alcohol use of about 4.0 standard drinks of alcohol per week. He reports that he does not use drugs.  Family History  Problem Relation Age of Onset   Cancer Mother        LIVER   Cancer Brother        PROSTATE   Cancer Daughter     Medications: Patient's Medications  New Prescriptions   No medications on file  Previous Medications   ALPHA-LIPOIC ACID 200 MG CAPS    Take  200 mg by mouth.   ASCORBIC ACID (VITAMIN C) 500 MG TABLET    Take 500-1,000 mg by mouth daily.   B COMPLEX VITAMINS TABLET    Take 1 tablet by mouth daily.   BOSWELLIA SERRATA (BOSWELLIA PO)    Take 800 mg by mouth.   CHOLECALCIFEROL (VITAMIN D3) 125 MCG (5000 UT) CAPS    Take 5,000 Units by mouth daily.   CINNAMON 500 MG TABS    Take 500 mg by mouth 3 (three) times a week.    CITRUS BERGAMOT PO    Take 1 capsule by mouth 2 (two) times a week.   CLOPIDOGREL (PLAVIX) 75 MG TABLET    TAKE 1 TABLET(75 MG) BY MOUTH DAILY   COENZYME Q10 (CO Q 10) 100 MG CAPS    Take 100 mg by mouth daily.    CYANOCOBALAMIN (VITAMIN B-12 PO)    Take 1 tablet  by mouth daily.   DIAZEPAM (VALIUM) 5 MG TABLET    TAKE 1/2 TABLET(2.5 MG) BY MOUTH TWICE DAILY   DOXEPIN (SINEQUAN) 75 MG CAPSULE    Take 1 capsule (75 mg total) by mouth at bedtime.   EZETIMIBE (ZETIA) 10 MG TABLET    Take 1 tablet (10 mg total) by mouth daily.   FLUTICASONE-SALMETEROL (ADVAIR) 250-50 MCG/DOSE AEPB    Inhale 1 puff into the lungs once a week.    GRAPE SEED OIL    Take 1 capsule by mouth daily.   GREEN TEA, CAMELLIA SINENSIS, (GREEN TEA EXTRACT PO)    Take 1 capsule by mouth daily.   METOPROLOL SUCCINATE (TOPROL-XL) 50 MG 24 HR TABLET    Take one tablet by mouth once daily. Take with or immediately following a meal.   NITROGLYCERIN (NITROSTAT) 0.4 MG SL TABLET    Place 1 tablet (0.4 mg total) under the tongue every 5 (five) minutes as needed.   RAMIPRIL (ALTACE) 10 MG CAPSULE    TAKE 1 CAPSULE BY MOUTH EVERY DAY   RESVERATROL 100 MG CAPS    Take 100 mg by mouth daily.    ROSUVASTATIN (CRESTOR) 40 MG TABLET    TAKE 1 TABLET BY MOUTH EVERY MONDAY, WEDNESDAY, AND FRIDAY   TAMSULOSIN (FLOMAX) 0.4 MG CAPS CAPSULE    Take 1 capsule (0.4 mg total) by mouth daily.   TURMERIC PO    Take 1 capsule by mouth as needed.    UNABLE TO FIND    Take 1 capsule by mouth See admin instructions. [employer] Mediterranean Nutrient capsules: Take 1 capsule by mouth once  a day   ZINC GLUCONATE 50 MG TABLET    Take 50 mg by mouth daily.  Modified Medications   No medications on file  Discontinued Medications   No medications on file    Physical Exam:  Vitals:   10/27/22 1317  BP: 128/88  Pulse: 66  Resp: 18  Temp: 97.9 F (36.6 C)  SpO2: 95%  Weight: 230 lb 3.2 oz (104.4 kg)  Height: 5\' 10"  (1.778 m)   Body mass index is 33.03 kg/m. Wt Readings from Last 3 Encounters:  10/27/22 230 lb 3.2 oz (104.4 kg)  10/20/22 230 lb (104.3 kg)  10/15/22 230 lb (104.3 kg)    Physical Exam Vitals and nursing note reviewed.  Constitutional:      Appearance: Normal appearance.  Cardiovascular:     Rate and Rhythm: Normal rate and regular rhythm.  Pulmonary:     Effort: Pulmonary effort is normal.     Breath sounds: Normal breath sounds.  Abdominal:     General: Bowel sounds are normal.     Palpations: Abdomen is soft.  Neurological:     General: No focal deficit present.     Mental Status: He is alert and oriented to person, place, and time.  Psychiatric:        Mood and Affect: Mood normal.        Behavior: Behavior normal.     Labs reviewed: Basic Metabolic Panel: No results for input(s): "NA", "K", "CL", "CO2", "GLUCOSE", "BUN", "CREATININE", "CALCIUM", "MG", "PHOS", "TSH" in the last 8760 hours. Liver Function Tests: No results for input(s): "AST", "ALT", "ALKPHOS", "BILITOT", "PROT", "ALBUMIN" in the last 8760 hours. No results for input(s): "LIPASE", "AMYLASE" in the last 8760 hours. No results for input(s): "AMMONIA" in the last 8760 hours. CBC: No results for input(s): "WBC", "NEUTROABS", "HGB", "HCT", "MCV", "PLT" in the last  8760 hours. Lipid Panel: No results for input(s): "CHOL", "HDL", "LDLCALC", "TRIG", "CHOLHDL", "LDLDIRECT" in the last 8760 hours. TSH: No results for input(s): "TSH" in the last 8760 hours. A1C: Lab Results  Component Value Date   HGBA1C 5.8 (H) 04/14/2022     Assessment/Plan 1. Prediabetes A1c was  slightly elevated at 5.86 months ago  2. Essential hypertension Blood pressure controlled at 127/88  3. Hyperlipidemia with target LDL less than 70 Has not had lipids assessed in 18 months LDL was last at 70 with total cholesterol of 135  4. Bilateral lower extremity edema Currently off diuretics    Jacalyn Lefevre, MD [facility] [phone]

## 2022-10-28 LAB — COMPLETE METABOLIC PANEL WITH GFR
AG Ratio: 1.7 (calc) (ref 1.0–2.5)
ALT: 14 U/L (ref 9–46)
AST: 17 U/L (ref 10–35)
Albumin: 4.1 g/dL (ref 3.6–5.1)
Alkaline phosphatase (APISO): 45 U/L (ref 35–144)
BUN: 14 mg/dL (ref 7–25)
CO2: 28 mmol/L (ref 20–32)
Calcium: 9.3 mg/dL (ref 8.6–10.3)
Chloride: 106 mmol/L (ref 98–110)
Creat: 0.98 mg/dL (ref 0.70–1.22)
Globulin: 2.4 g/dL (calc) (ref 1.9–3.7)
Glucose, Bld: 97 mg/dL (ref 65–139)
Potassium: 3.9 mmol/L (ref 3.5–5.3)
Sodium: 142 mmol/L (ref 135–146)
Total Bilirubin: 0.5 mg/dL (ref 0.2–1.2)
Total Protein: 6.5 g/dL (ref 6.1–8.1)
eGFR: 74 mL/min/{1.73_m2} (ref >=60)

## 2022-10-28 LAB — LIPID PANEL
Cholesterol: 126 mg/dL (ref <200)
HDL: 39 mg/dL — ABNORMAL LOW (ref >=40)
LDL Cholesterol (Calc): 66 mg/dL (calc)
Non-HDL Cholesterol (Calc): 87 mg/dL (calc) (ref <130)
Total CHOL/HDL Ratio: 3.2 (calc) (ref <5.0)
Triglycerides: 129 mg/dL (ref <150)

## 2022-10-29 ENCOUNTER — Other Ambulatory Visit: Payer: Self-pay | Admitting: Cardiology

## 2022-11-05 ENCOUNTER — Encounter: Payer: Medicare HMO | Admitting: Adult Health

## 2022-11-23 ENCOUNTER — Encounter: Payer: Self-pay | Admitting: Family

## 2022-11-23 ENCOUNTER — Ambulatory Visit (INDEPENDENT_AMBULATORY_CARE_PROVIDER_SITE_OTHER): Payer: Medicare HMO | Admitting: Family

## 2022-11-23 VITALS — BP 160/80 | HR 83 | Temp 98.2°F | Resp 20 | Ht 70.0 in | Wt 226.0 lb

## 2022-11-23 DIAGNOSIS — R509 Fever, unspecified: Secondary | ICD-10-CM

## 2022-11-23 DIAGNOSIS — R5383 Other fatigue: Secondary | ICD-10-CM | POA: Diagnosis not present

## 2022-11-23 DIAGNOSIS — R350 Frequency of micturition: Secondary | ICD-10-CM

## 2022-11-23 LAB — CBC WITH DIFFERENTIAL/PLATELET
Absolute Monocytes: 1058 cells/uL — ABNORMAL HIGH (ref 200–950)
Basophils Relative: 0.4 %
HCT: 37.5 % — ABNORMAL LOW (ref 38.5–50.0)
Hemoglobin: 12.8 g/dL — ABNORMAL LOW (ref 13.2–17.1)
Lymphs Abs: 1579 cells/uL (ref 850–3900)
MCH: 32.7 pg (ref 27.0–33.0)
MCHC: 34.1 g/dL (ref 32.0–36.0)
MCV: 95.9 fL (ref 80.0–100.0)
MPV: 9.7 fL (ref 7.5–12.5)
Neutro Abs: 14028 cells/uL — ABNORMAL HIGH (ref 1500–7800)

## 2022-11-23 LAB — POCT URINALYSIS DIPSTICK
Bilirubin, UA: NEGATIVE
Glucose, UA: NEGATIVE
Ketones, UA: NEGATIVE
Nitrite, UA: NEGATIVE
Protein, UA: POSITIVE — AB
Spec Grav, UA: 1.02 (ref 1.010–1.025)
Urobilinogen, UA: 0.2 E.U./dL
pH, UA: 6.5 (ref 5.0–8.0)

## 2022-11-23 MED ORDER — CIPROFLOXACIN HCL 500 MG PO TABS
500.0000 mg | ORAL_TABLET | Freq: Two times a day (BID) | ORAL | 0 refills | Status: DC
Start: 2022-11-23 — End: 2022-11-26

## 2022-11-23 NOTE — Progress Notes (Unsigned)
Provider: Ceazia Harb FNP-C  Frederica Kuster, MD  Patient Care Team: Frederica Kuster, MD as PCP - General (Family Medicine) Marykay Lex, MD as PCP - Cardiology (Cardiology) Runell Gess, MD as Consulting Physician (Cardiology) Nada Libman, MD as Consulting Physician (Vascular Surgery) Mckinley Jewel, MD as Consulting Physician (Ophthalmology) Alfredo Martinez, MD as Consulting Physician (Urology) Mckinley Jewel, MD as Consulting Physician (Ophthalmology)  Extended Emergency Contact Information Primary Emergency Contact: Santosuosso,Jack Address: [address]          [place], [place] [address] [place] Home Phone: [phone] Relation: Son Secondary Emergency Contact: Mikel Cella Address: [address]          [place] [id number] [place] Home Phone: [phone] Relation: Son  Code Status: Full Code  Goals of care: Advanced Directive information    10/27/2022   11:17 AM  Advanced Directives  Does Patient Have a Medical Advance Directive? No  Would patient like information on creating a medical advance directive? No - Patient declined     Chief Complaint  Patient presents with   Acute Visit    Patient is here for fatigue and chills, and fever, body ache Patient has not been able to urinate started with frequency for last 3 days now can't go    HPI:  Pt is a 89 y.o. male seen today for an acute visit for evaluation of fatigue,chills,fever, body ache.States temp yesterday was 101.1  States had urine frequency x 3 nights in a row had to get up several times. Has not been able to urinate when standing has to sit on commode. States did not take his Flomax the other day and today.  States no appetite but had a bacon and egg this morning and does not feel hungry or want to eat anything.   Past Medical History:  Diagnosis Date   Acquired deformity of nose    Calculus of kidney    Cervicalgia    Chronic  heart failure with preserved ejection fraction (HFpEF) (HCC)    EF ~45-50%   Esophagitis, unspecified    Essential hypertension    Hyperlipidemia    Hypertrophy of prostate with urinary obstruction and other lower urinary tract symptoms (LUTS)    Insomnia, unspecified    Internal hemorrhoids without mention of complication    Left carotid artery stenosis    S/P L CEA - 2015 (Dr. Myra Gianotti)   Multiple vessel coronary artery disease 2000   ~50% LM-ostLAD with 85% ost-mid LCx, 80% ost RI, 100% CTO RCA -> CABG X 4 (LIMA-LAD, RIMA-dRCA, SVG-OM2, SVG-RI) (Dr. Dorris Fetch) => 06/2019: STEMI : 100% SVG-OM2 (unable to open), 100% OM2; DES PCI LM-mLCx.    Obesity, unspecified    Old myocardial infarction 06/2019   True posterior STEMI -> occluded SVG.  Native LM--LCX PCI   Other and unspecified hyperlipidemia    Rhinophyma 06/05/2014   Unspecified malignant neoplasm of scalp and skin of neck    Unspecified malignant neoplasm of skin of ear and external auditory canal    Unspecified vitamin D deficiency    Urinary frequency    Past Surgical History:  Procedure Laterality Date   CAROTID ENDARTERECTOMY Left 06-14-14   CE   COLONOSCOPY  2007   Dr Virginia Rochester, normal   CORONARY ARTERY BYPASS GRAFT  2000   Dr. Dorris Fetch - CABG x 4:  LIMA-LAD, RIMA-dRCA, SVG-OM2, SVG-RI   CORONARY STENT INTERVENTION N/A 06/22/2019   Procedure: CORONARY STENT  INTERVENTION;  Surgeon: Marykay Lex, MD;  Location: [facility] INVASIVE CV LAB; unsuccessful attempt at revascularization of SVG-OM2 => Synergy DES PCI from LM-mid LCx (Synergy DES 2.5 mm x 38 mm--> 3.1-2.6 mm); unfortunately OM2 was occluded.  This revascularized OM1, OM 3 and OM 4.   CORONARY/GRAFT ACUTE MI REVASCULARIZATION N/A 06/22/2019   Procedure: CORONARY/GRAFT ACUTE MI REVASCULARIZATION;  Surgeon: Marykay Lex, MD;  Location: [facility] INVASIVE CV LAB;; unsuccessful PTCA/aspiration thrombectomy of SVG-OM1 as culprit lesion for STEMI   ENDARTERECTOMY Left 06/14/2014    Procedure: LEFT CAROTID ENDARTERECTOMY WITH VASCUGUARD PATCH ANGIOPLASTY;  Surgeon: Nada Libman, MD;  Location: [facility] OR;  Service: Vascular;  Laterality: Left;   ESOPHAGOGASTRODUODENOSCOPY ENDOSCOPY  2007   Dr Virginia Rochester, with biopsy   EXCISIONAL HEMORRHOIDECTOMY  1981   LEFT HEART CATH AND CORONARY ANGIOGRAPHY  2000   MV CAD --> CABG x 4   LEFT HEART CATH AND CORS/GRAFTS ANGIOGRAPHY N/A 06/22/2019   Procedure: LEFT HEART CATH AND CORS/GRAFTS ANGIOGRAPHY;  Surgeon: Marykay Lex, MD;  CULPRIT LESION = 100% SVG-OM2; Severe native CAD: mLM-Ost LAD-50% w/ Ost LCx 85% (DES PCI OM-OstLCx)- Treated b/c 100% TO SVG-OM2 as Culprit lesion - unable to open. OM2 100%. 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  GR 1 DD.  No obvious R WMA.  Essentially normal valves.  Mild MR.   trench in right side of jaw after abcesses  06/01/2016    No Known Allergies  Outpatient Encounter Medications as of 11/23/2022  Medication Sig   Alpha-Lipoic Acid 200 MG CAPS Take 200 mg by mouth.   ascorbic acid (VITAMIN C) 500 MG tablet Take 500-1,000 mg by mouth daily.   b complex vitamins tablet Take 1 tablet by mouth daily.   Boswellia Serrata (BOSWELLIA PO) Take 800 mg by mouth.   Cholecalciferol (VITAMIN D3) 125 MCG (5000 UT) CAPS Take 5,000 Units by mouth daily.   Cinnamon 500 MG TABS Take 500 mg by mouth 3 (three) times a week.    CITRUS BERGAMOT PO Take 1 capsule by mouth 2 (two) times a week.   clopidogrel (PLAVIX) 75 MG tablet TAKE 1 TABLET(75 MG) BY MOUTH DAILY   Coenzyme Q10 (CO Q 10) 100 MG CAPS Take 100 mg by mouth daily.    Cyanocobalamin (VITAMIN B-12 PO) Take 1 tablet by  mouth daily.   diazepam (VALIUM) 5 MG tablet TAKE 1/2 TABLET(2.5 MG) BY MOUTH TWICE DAILY   doxepin (SINEQUAN) 75 MG capsule Take 1 capsule (75 mg total) by mouth at bedtime.   ezetimibe (ZETIA) 10 MG tablet Take 1 tablet (10 mg total) by mouth daily.   Fluticasone-Salmeterol (ADVAIR) 250-50 MCG/DOSE AEPB Inhale 1 puff into the lungs once a week.    Grape Seed OIL Take 1 capsule by mouth daily.   Green Tea, Camellia sinensis, (GREEN TEA EXTRACT PO) Take 1 capsule by mouth daily.   metoprolol succinate (TOPROL-XL) 50 MG 24 hr tablet Take one tablet by mouth once daily. Take with or immediately following a meal.   nitroGLYCERIN (NITROSTAT) 0.4 MG SL tablet Place 1 tablet (0.4 mg total) under the tongue  every 5 (five) minutes as needed.   ramipril (ALTACE) 10 MG capsule TAKE 1 CAPSULE BY MOUTH EVERY DAY   RESVERATROL 100 MG CAPS Take 100 mg by mouth daily.    rosuvastatin (CRESTOR) 40 MG tablet Take 1 tablet (40 mg total) by mouth daily.   tamsulosin (FLOMAX) 0.4 MG CAPS capsule Take 1 capsule (0.4 mg total) by mouth daily.   TURMERIC PO Take 1 capsule by mouth as needed.    zinc gluconate 50 MG tablet Take 50 mg by mouth daily.   UNABLE TO FIND Take 1 capsule by mouth See admin instructions. Swanson Mediterranean Nutrient capsules: Take 1 capsule by mouth once a day (Patient not taking: Reported on 11/23/2022)   No facility-administered encounter medications on file as of 11/23/2022.    Review of Systems  Constitutional:  Positive for appetite change, chills, fatigue and fever. Negative for unexpected weight change.  HENT:  Negative for congestion, dental problem, ear discharge, ear pain, facial swelling, hearing loss, nosebleeds, postnasal drip, rhinorrhea, sinus pressure, sinus pain, sneezing, sore throat, tinnitus and trouble swallowing.   Eyes:  Negative for pain, discharge, redness, itching and visual disturbance.  Respiratory:  Negative for cough, chest tightness, shortness of breath and  wheezing.   Cardiovascular:  Negative for chest pain, palpitations and leg swelling.  Gastrointestinal:  Negative for abdominal distention, abdominal pain, blood in stool, constipation, diarrhea, nausea and vomiting.  Genitourinary:  Positive for frequency. Negative for difficulty urinating, dysuria, flank pain and urgency.  Musculoskeletal:  Positive for gait problem. Negative for arthralgias, back pain, joint swelling, myalgias, neck pain and neck stiffness.  Skin:  Negative for color change, pallor and rash.  Neurological:  Negative for dizziness, weakness, light-headedness, numbness and headaches.  Psychiatric/Behavioral:  Negative for agitation, behavioral problems, confusion, hallucinations and sleep disturbance. The patient is not nervous/anxious.     Immunization History  Administered Date(s) Administered   Fluad Quad(high Dose 65+) 01/18/2019, 01/21/2020   Influenza Whole 02/06/2013   Influenza, High Dose Seasonal PF 12/09/2015, 01/10/2017, 01/19/2018   Influenza-Unspecified 01/31/2009, 01/08/2014, 12/13/2014   Moderna Covid-19 Vaccine Bivalent Booster 75yrs & up 09/24/2021   Moderna Sars-Covid-2 Vaccination 05/12/2019, 07/11/2019, 08/08/2019   Pneumococcal Conjugate-13 11/08/2013   Pneumococcal-Unspecified 05/03/2004   Td 05/03/2004   Tdap 10/31/2019   Zoster, Live 07/19/2012   Pertinent  Health Maintenance Due  Topic Date Due   INFLUENZA VACCINE  12/02/2022      10/14/2021   10:55 AM 11/12/2021    2:26 PM 04/14/2022    1:24 PM 10/15/2022   11:06 AM 10/27/2022    1:16 PM  Fall Risk  Falls in the past year? 0 0 0 0 0  Was there an injury with Fall? 0 0 0 0 0  Fall Risk Category Calculator 0 0 0 0 0  Fall Risk Category (Retired) Low Low Low    (RETIRED) Patient Fall Risk Level Low fall risk Low fall risk Low fall risk    Patient at Risk for Falls Due to No Fall Risks No Fall Risks No Fall Risks No Fall Risks No Fall Risks  Fall risk Follow up Falls evaluation completed  Falls evaluation completed Falls evaluation completed Falls evaluation completed Falls evaluation completed   Functional Status Survey:    Vitals:   11/23/22 0912 11/23/22 1328  BP: (!) 160/88 (!) 160/80  Pulse: 83   Resp: 20   Temp: (!) 100.4 F (38 C) 98.2 F (36.8 C)  TempSrc: Oral Temporal  SpO2: 96%  Weight: 226 lb (102.5 kg) 226 lb (102.5 kg)  Height: 5\' 10"  (1.778 m) 5\' 10"  (1.778 m)   Body mass index is 32.43 kg/m. Physical Exam Vitals reviewed.  Constitutional:      General: He is not in acute distress.    Appearance: Normal appearance. He is normal weight. He is not ill-appearing or diaphoretic.  HENT:     Head: Normocephalic.     Nose: Nose normal. No congestion or rhinorrhea.     Mouth/Throat:     Mouth: Mucous membranes are moist.     Pharynx: Oropharynx is clear. No oropharyngeal exudate or posterior oropharyngeal erythema.  Eyes:     General: No scleral icterus.       Right eye: No discharge.        Left eye: No discharge.     Extraocular Movements: Extraocular movements intact.     Conjunctiva/sclera: Conjunctivae normal.     Pupils: Pupils are equal, round, and reactive to light.  Neck:     Vascular: No carotid bruit.  Cardiovascular:     Rate and Rhythm: Normal rate and regular rhythm.     Pulses: Normal pulses.     Heart sounds: Normal heart sounds. No murmur heard.    No friction rub. No gallop.  Pulmonary:     Effort: Pulmonary effort is normal. No respiratory distress.     Breath sounds: Normal breath sounds. No wheezing, rhonchi or rales.  Chest:     Chest wall: No tenderness.  Abdominal:     General: Bowel sounds are normal. There is no distension.     Palpations: Abdomen is soft. There is no mass.     Tenderness: There is abdominal tenderness in the suprapubic area. There is no right CVA tenderness, left CVA tenderness, guarding or rebound.  Musculoskeletal:        General: No swelling or tenderness. Normal range of motion.      Cervical back: Normal range of motion. No rigidity or tenderness.     Right lower leg: No edema.     Left lower leg: No edema.  Lymphadenopathy:     Cervical: No cervical adenopathy.  Skin:    General: Skin is warm and dry.     Coloration: Skin is not pale.     Findings: No bruising, erythema, lesion or rash.  Neurological:     Mental Status: He is alert and oriented to person, place, and time.     Cranial Nerves: No cranial nerve deficit.     Sensory: No sensory deficit.     Motor: No weakness.     Coordination: Coordination normal.     Gait: Gait abnormal.  Psychiatric:        Mood and Affect: Mood normal.        Speech: Speech normal.        Behavior: Behavior normal.        Thought Content: Thought content normal.        Judgment: Judgment normal.     Labs reviewed: Recent Labs    10/27/22 1401  NA 142  K 3.9  CL 106  CO2 28  GLUCOSE 97  BUN 14  CREATININE 0.98  CALCIUM 9.3   Recent Labs    10/27/22 1401  AST 17  ALT 14  BILITOT 0.5  PROT 6.5   No results for input(s): "WBC", "NEUTROABS", "HGB", "HCT", "MCV", "PLT" in the last 8760 hours. Lab Results  Component Value Date   TSH 1.390  10/06/2012   Lab Results  Component Value Date   HGBA1C 5.8 (H) 04/14/2022   Lab Results  Component Value Date   CHOL 126 10/27/2022   HDL 39 (L) 10/27/2022   LDLCALC 66 10/27/2022   TRIG 129 10/27/2022   CHOLHDL 3.2 10/27/2022    Significant Diagnostic Results in last 30 days:  No results found.  Assessment/Plan  1. Chills with fever Febrile -Possibly due to urinary tract infection will obtain urine specimen to rule out UTI - POC COVID-19 negative - CBC with Differential/Platelet - COMPLETE METABOLIC PANEL WITH GFR  2. Fatigue, unspecified type Will obtain lab work to rule out other acute metabolic etiologies - CBC with Differential/Platelet - COMPLETE METABOLIC PANEL WITH GFR  3. Urine frequency Febrile - Urine Culture - ciprofloxacin (CIPRO) 500  MG tablet; Take 1 tablet (500 mg total) by mouth 2 (two) times daily for 7 days.  Dispense: 14 tablet; Refill: 0 - POC Urinalysis Dipstick indicates dark yellow urine with large blood and moderate 2+ leukocytes but negative for nitrites. -Will send urine for culture -Will start on antibiotics since he is febrile with positive leukocytes on the urine dipstick and lower abdominal tenderness on exam. -Advised to restart his tamsulosin to prevent urine retention. -Notify provider if symptoms worsen or fail to improve  Family/ staff Communication: Reviewed plan of care with patient verbalized understanding  Labs/tests ordered:  - POC Urinalysis Dipstick  - Urine Culture   Next Appointment: Return if symptoms worsen or fail to improve.   Caesar Bookman, NP

## 2022-11-23 NOTE — Patient Instructions (Addendum)
-   Restart Tamsulosin as prescribed.   - increase water intake   - Notify provider if symptoms worsen or fail to improve

## 2022-11-24 LAB — COMPLETE METABOLIC PANEL WITH GFR
AG Ratio: 1.5 (calc) (ref 1.0–2.5)
ALT: 11 U/L (ref 9–46)
AST: 15 U/L (ref 10–35)
Albumin: 4.1 g/dL (ref 3.6–5.1)
Alkaline phosphatase (APISO): 59 U/L (ref 35–144)
BUN: 15 mg/dL (ref 7–25)
CO2: 28 mmol/L (ref 20–32)
Calcium: 9.1 mg/dL (ref 8.6–10.3)
Chloride: 101 mmol/L (ref 98–110)
Creat: 0.97 mg/dL (ref 0.70–1.22)
Globulin: 2.8 g/dL (calc) (ref 1.9–3.7)
Glucose, Bld: 94 mg/dL (ref 65–99)
Potassium: 4.1 mmol/L (ref 3.5–5.3)
Sodium: 138 mmol/L (ref 135–146)
Total Bilirubin: 0.6 mg/dL (ref 0.2–1.2)
Total Protein: 6.9 g/dL (ref 6.1–8.1)
eGFR: 75 mL/min/{1.73_m2} (ref >=60)

## 2022-11-24 LAB — POC COVID19 BINAXNOW: SARS Coronavirus 2 Ag: NEGATIVE

## 2022-11-24 LAB — CBC WITH DIFFERENTIAL/PLATELET
Basophils Absolute: 67 cells/uL (ref 0–200)
Eosinophils Absolute: 67 cells/uL (ref 15–500)
Eosinophils Relative: 0.4 %
Monocytes Relative: 6.3 %
Neutrophils Relative %: 83.5 %
Platelets: 233 10*3/uL (ref 140–400)
RBC: 3.91 10*6/uL — ABNORMAL LOW (ref 4.20–5.80)
RDW: 12.3 % (ref 11.0–15.0)
Total Lymphocyte: 9.4 %
WBC: 16.8 10*3/uL — ABNORMAL HIGH (ref 3.8–10.8)

## 2022-11-25 LAB — URINE CULTURE
MICRO NUMBER:: 15236659
SPECIMEN QUALITY:: ADEQUATE

## 2022-11-26 ENCOUNTER — Other Ambulatory Visit: Payer: Self-pay

## 2022-11-26 MED ORDER — NITROFURANTOIN MONOHYD MACRO 100 MG PO CAPS: 100.0000 mg | ORAL_CAPSULE | Freq: Two times a day (BID) | ORAL | 0 refills | Status: DC

## 2022-12-10 ENCOUNTER — Other Ambulatory Visit: Payer: Self-pay

## 2022-12-10 ENCOUNTER — Other Ambulatory Visit: Payer: Medicare HMO

## 2022-12-10 ENCOUNTER — Telehealth: Payer: Self-pay | Admitting: *Deleted

## 2022-12-10 ENCOUNTER — Encounter: Payer: Self-pay | Admitting: Family Medicine

## 2022-12-10 DIAGNOSIS — R509 Fever, unspecified: Secondary | ICD-10-CM

## 2022-12-10 DIAGNOSIS — R351 Nocturia: Secondary | ICD-10-CM

## 2022-12-10 DIAGNOSIS — R5383 Other fatigue: Secondary | ICD-10-CM

## 2022-12-10 NOTE — Telephone Encounter (Signed)
Patient called and stated that he finished up his Antibiotic that you gave him on 11/23/22 and his symptoms has improved but he still does not have any energy and today he broke out into a cold sweat. Stated that he has had alittle bit of shortness of breath. No other symptoms noted. Wonders if he still needs to continue the antibiotic for alittle longer.   Please Advise.

## 2022-12-10 NOTE — Telephone Encounter (Signed)
Recommend recollecting urine specimen to evaluate urinary tract infection.

## 2022-12-10 NOTE — Telephone Encounter (Signed)
Patient notified and agreed.  Added patient to lab schedule for Monday and placed order.

## 2022-12-10 NOTE — Telephone Encounter (Signed)
Patient is requesting a refill of the following medications: Requested Prescriptions   Pending Prescriptions Disp Refills   diazepam (VALIUM) 5 MG tablet 30 tablet 2    Sig: TAKE 1/2 TABLET(2.5 MG) BY MOUTH TWICE DAILY    Date of last refill:07/15/2022  Refill amount: 30 tablets 2 refills   Treatment agreement date:

## 2022-12-13 ENCOUNTER — Ambulatory Visit (INDEPENDENT_AMBULATORY_CARE_PROVIDER_SITE_OTHER): Payer: Medicare HMO | Admitting: Orthopedic Surgery

## 2022-12-13 ENCOUNTER — Encounter: Payer: Self-pay | Admitting: Family Medicine

## 2022-12-13 ENCOUNTER — Other Ambulatory Visit: Payer: Medicare HMO

## 2022-12-13 ENCOUNTER — Encounter: Payer: Self-pay | Admitting: Orthopedic Surgery

## 2022-12-13 VITALS — BP 110/60 | HR 89 | Temp 97.9°F | Resp 17 | Ht 70.0 in | Wt 221.0 lb

## 2022-12-13 DIAGNOSIS — R531 Weakness: Secondary | ICD-10-CM

## 2022-12-13 DIAGNOSIS — R350 Frequency of micturition: Secondary | ICD-10-CM | POA: Diagnosis not present

## 2022-12-13 DIAGNOSIS — R5383 Other fatigue: Secondary | ICD-10-CM | POA: Diagnosis not present

## 2022-12-13 DIAGNOSIS — R509 Fever, unspecified: Secondary | ICD-10-CM | POA: Diagnosis not present

## 2022-12-13 DIAGNOSIS — R351 Nocturia: Secondary | ICD-10-CM | POA: Diagnosis not present

## 2022-12-13 LAB — POCT URINALYSIS DIPSTICK
Bilirubin, UA: NEGATIVE
Glucose, UA: NEGATIVE
Ketones, UA: POSITIVE
Nitrite, UA: NEGATIVE
Protein, UA: POSITIVE — AB
Spec Grav, UA: >=1.03 — AB (ref 1.010–1.025)
Urobilinogen, UA: NEGATIVE E.U./dL — AB
pH, UA: 6 (ref 5.0–8.0)

## 2022-12-13 MED ORDER — CEPHALEXIN 500 MG PO CAPS
500.0000 mg | ORAL_CAPSULE | Freq: Four times a day (QID) | ORAL | 0 refills | Status: AC
Start: 2022-12-13 — End: 2022-12-18

## 2022-12-13 MED ORDER — DIAZEPAM 5 MG PO TABS: ORAL_TABLET | ORAL | 2 refills | Status: DC

## 2022-12-13 NOTE — Progress Notes (Signed)
Careteam: Patient Care Team: Frederica Kuster, MD as PCP - General (Family Medicine) Marykay Lex, MD as PCP - Cardiology (Cardiology) Runell Gess, MD as Consulting Physician (Cardiology) Nada Libman, MD as Consulting Physician (Vascular Surgery) Mckinley Jewel, MD as Consulting Physician (Ophthalmology) Alfredo Martinez, MD as Consulting Physician (Urology) Mckinley Jewel, MD as Consulting Physician (Ophthalmology)  Seen by: Hazle Nordmann, AGNP-C  PLACE OF SERVICE:  [facility]  Advanced Directive information Does Patient Have a Medical Advance Directive?: No, Would patient like information on creating a medical advance directive?: No - Patient declined  No Known Allergies  Chief Complaint  Patient presents with   Acute Visit    Patient complains of weakness, SOB, urinary incontinence, and fever.     HPI: Patient is a 89 y.o. male seen today for acute visit due to weakness and urinary frequency.   07/23 she was seen due to fever and chills. He was started on Cipro for possible UTI. Urine culture revealed enterococcus faecalis. Cipro was stopped. He was prescribed nitrofurantoin. He had completed antibiotic as prescribed. 3 days ago, he began to have urinary frequency and weakness. He is incontinent and uses briefs. Admits to not drinking fluids well. Urine with cloudy appearance. UA with leukocytes large 3+. Afebrile. No CVA tenderness. Vitals stable.         Review of Systems:  Review of Systems  Constitutional:  Positive for malaise/fatigue. Negative for chills and fever.  HENT:  Negative for congestion and sore throat.   Respiratory:  Negative for cough, shortness of breath and wheezing.   Cardiovascular:  Negative for chest pain and leg swelling.  Gastrointestinal:  Negative for nausea and vomiting.  Genitourinary:  Positive for dysuria and frequency. Negative for flank pain and hematuria.  Musculoskeletal:  Negative for myalgias.   Neurological:  Positive for weakness.  Psychiatric/Behavioral:  Negative for depression. The patient is not nervous/anxious.     Past Medical History:  Diagnosis Date   Acquired deformity of nose    Calculus of kidney    Cervicalgia    Chronic heart failure with preserved ejection fraction (HFpEF) (HCC)    EF ~45-50%   Esophagitis, unspecified    Essential hypertension    Hyperlipidemia    Hypertrophy of prostate with urinary obstruction and other lower urinary tract symptoms (LUTS)    Insomnia, unspecified    Internal hemorrhoids without mention of complication    Left carotid artery stenosis    S/P L CEA - 2015 (Dr. Myra Gianotti)   Multiple vessel coronary artery disease 2000   ~50% LM-ostLAD with 85% ost-mid LCx, 80% ost RI, 100% CTO RCA -> CABG X 4 (LIMA-LAD, RIMA-dRCA, SVG-OM2, SVG-RI) (Dr. Dorris Fetch) => 06/2019: STEMI : 100% SVG-OM2 (unable to open), 100% OM2; DES PCI LM-mLCx.    Obesity, unspecified    Old myocardial infarction 06/2019   True posterior STEMI -> occluded SVG.  Native LM--LCX PCI   Other and unspecified hyperlipidemia    Rhinophyma 06/05/2014   Unspecified malignant neoplasm of scalp and skin of neck    Unspecified malignant neoplasm of skin of ear and external auditory canal    Unspecified vitamin D deficiency    Urinary frequency    Past Surgical History:  Procedure Laterality Date   CAROTID ENDARTERECTOMY Left 06-14-14   CE   COLONOSCOPY  2007   Dr Virginia Rochester, normal   CORONARY ARTERY BYPASS GRAFT  2000   Dr. Dorris Fetch - CABG x 4:  LIMA-LAD, RIMA-dRCA, SVG-OM2,  SVG-RI   CORONARY STENT INTERVENTION N/A 06/22/2019   Procedure: CORONARY STENT INTERVENTION;  Surgeon: Marykay Lex, MD;  Location: [facility] INVASIVE CV LAB; unsuccessful attempt at revascularization of SVG-OM2 => Synergy DES PCI from LM-mid LCx (Synergy DES 2.5 mm x 38 mm--> 3.1-2.6 mm); unfortunately OM2 was occluded.  This revascularized OM1, OM 3 and OM 4.   CORONARY/GRAFT ACUTE MI REVASCULARIZATION  N/A 06/22/2019   Procedure: CORONARY/GRAFT ACUTE MI REVASCULARIZATION;  Surgeon: Marykay Lex, MD;  Location: [facility] INVASIVE CV LAB;; unsuccessful PTCA/aspiration thrombectomy of SVG-OM1 as culprit lesion for STEMI   ENDARTERECTOMY Left 06/14/2014   Procedure: LEFT CAROTID ENDARTERECTOMY WITH VASCUGUARD PATCH ANGIOPLASTY;  Surgeon: Nada Libman, MD;  Location: [facility] OR;  Service: Vascular;  Laterality: Left;   ESOPHAGOGASTRODUODENOSCOPY ENDOSCOPY  2007   Dr Virginia Rochester, with biopsy   EXCISIONAL HEMORRHOIDECTOMY  1981   LEFT HEART CATH AND CORONARY ANGIOGRAPHY  2000   MV CAD --> CABG x 4   LEFT HEART CATH AND CORS/GRAFTS ANGIOGRAPHY N/A 06/22/2019   Procedure: LEFT HEART CATH AND CORS/GRAFTS ANGIOGRAPHY;  Surgeon: Marykay Lex, MD;  CULPRIT LESION = 100% SVG-OM2; Severe native CAD: mLM-Ost LAD-50% w/ Ost LCx 85% (DES PCI OM-OstLCx)- Treated b/c 100% TO SVG-OM2 as Culprit lesion - unable to open. OM2 100%. 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  GR 1 DD.  No obvious R WMA.  Essentially normal valves.  Mild MR.   trench in right side of jaw after abcesses  06/01/2016   Social History:   reports that he quit smoking about 49 years ago. His smoking use included cigarettes. He started smoking about 74 years ago. He has a 50 pack-year smoking history. He has never been exposed to tobacco smoke. He has never used smokeless tobacco. He reports current alcohol use of about 4.0 standard drinks of alcohol per week. He reports that he does not use drugs.  Family History  Problem Relation Age of Onset   Cancer Mother        LIVER   Cancer Brother        PROSTATE    Cancer Daughter     Medications: Patient's Medications  New Prescriptions   No medications on file  Previous Medications   ALPHA-LIPOIC ACID 200 MG CAPS    Take 200 mg by mouth.   ASCORBIC ACID (VITAMIN C) 500 MG TABLET    Take 500-1,000 mg by mouth daily.   B COMPLEX VITAMINS TABLET    Take 1 tablet by mouth daily.   BOSWELLIA SERRATA (BOSWELLIA PO)    Take 800 mg by mouth.   CHOLECALCIFEROL (VITAMIN D3) 125 MCG (5000 UT) CAPS    Take 5,000 Units by mouth daily.   CINNAMON 500 MG TABS    Take 500 mg by mouth 3 (three) times a week.    CITRUS BERGAMOT PO    Take 1 capsule by mouth 2 (two) times a week.   CLOPIDOGREL (PLAVIX) 75 MG TABLET    TAKE 1 TABLET(75 MG) BY MOUTH DAILY   COENZYME Q10 (CO Q 10) 100 MG CAPS    Take 100  mg by mouth daily.    CYANOCOBALAMIN (VITAMIN B-12 PO)    Take 1 tablet by mouth daily.   DIAZEPAM (VALIUM) 5 MG TABLET    TAKE 1/2 TABLET(2.5 MG) BY MOUTH TWICE DAILY   DOXEPIN (SINEQUAN) 75 MG CAPSULE    Take 1 capsule (75 mg total) by mouth at bedtime.   EZETIMIBE (ZETIA) 10 MG TABLET    Take 1 tablet (10 mg total) by mouth daily.   FLUTICASONE-SALMETEROL (ADVAIR) 250-50 MCG/DOSE AEPB    Inhale 1 puff into the lungs once a week.    GRAPE SEED OIL    Take 1 capsule by mouth daily.   GREEN TEA, CAMELLIA SINENSIS, (GREEN TEA EXTRACT PO)    Take 1 capsule by mouth daily.   METOPROLOL SUCCINATE (TOPROL-XL) 50 MG 24 HR TABLET    Take one tablet by mouth once daily. Take with or immediately following a meal.   NITROGLYCERIN (NITROSTAT) 0.4 MG SL TABLET    Place 1 tablet (0.4 mg total) under the tongue every 5 (five) minutes as needed.   RAMIPRIL (ALTACE) 10 MG CAPSULE    TAKE 1 CAPSULE BY MOUTH EVERY DAY   RESVERATROL 100 MG CAPS    Take 100 mg by mouth daily.    ROSUVASTATIN (CRESTOR) 40 MG TABLET    Take 1 tablet (40 mg total) by mouth daily.   TAMSULOSIN (FLOMAX) 0.4 MG CAPS CAPSULE    Take 1 capsule (0.4 mg total) by mouth daily.   TURMERIC PO    Take 1 capsule by  mouth as needed.    ZINC GLUCONATE 50 MG TABLET    Take 50 mg by mouth daily.  Modified Medications   No medications on file  Discontinued Medications   NITROFURANTOIN, MACROCRYSTAL-MONOHYDRATE, (MACROBID) 100 MG CAPSULE    Take 1 capsule (100 mg total) by mouth 2 (two) times daily.   UNABLE TO FIND    Take 1 capsule by mouth See admin instructions. [place] Mediterranean Nutrient capsules: Take 1 capsule by mouth once a day    Physical Exam:  Vitals:   12/13/22 1409  BP: 110/60  Pulse: 89  Resp: 17  Temp: 97.9 F (36.6 C)  SpO2: 95%  Weight: 221 lb (100.2 kg)  Height: 5\' 10"  (1.778 m)   Body mass index is 31.71 kg/m. Wt Readings from Last 3 Encounters:  12/13/22 221 lb (100.2 kg)  11/23/22 226 lb (102.5 kg)  10/27/22 230 lb 3.2 oz (104.4 kg)    Physical Exam Vitals reviewed.  Constitutional:      General: He is not in acute distress. HENT:     Head: Normocephalic.  Eyes:     General:        Right eye: No discharge.        Left eye: No discharge.  Cardiovascular:     Rate and Rhythm: Normal rate and regular rhythm.     Pulses: Normal pulses.     Heart sounds: Normal heart sounds.  Pulmonary:     Effort: Pulmonary effort is normal. No respiratory distress.     Breath sounds: Normal breath sounds. No wheezing or rales.  Abdominal:     General: Bowel sounds are normal.     Palpations: Abdomen is soft.  Musculoskeletal:     Cervical back: Neck supple.     Right lower leg: No edema.     Left lower leg: No edema.  Skin:    General: Skin is warm.     Capillary  Refill: Capillary refill takes less than 2 seconds.  Neurological:     General: No focal deficit present.     Mental Status: He is alert and oriented to person, place, and time.     Motor: No weakness.     Gait: Gait normal.  Psychiatric:        Mood and Affect: Mood normal.     Labs reviewed: Basic Metabolic Panel: Recent Labs    10/27/22 1401 11/23/22 1400  NA 142 138  K 3.9 4.1  CL 106 101   CO2 28 28  GLUCOSE 97 94  BUN 14 15  CREATININE 0.98 0.97  CALCIUM 9.3 9.1   Liver Function Tests: Recent Labs    10/27/22 1401 11/23/22 1400  AST 17 15  ALT 14 11  BILITOT 0.5 0.6  PROT 6.5 6.9   No results for input(s): "LIPASE", "AMYLASE" in the last 8760 hours. No results for input(s): "AMMONIA" in the last 8760 hours. CBC: Recent Labs    11/23/22 1400  WBC 16.8*  NEUTROABS 14,028*  HGB 12.8*  HCT 37.5*  MCV 95.9  PLT 233   Lipid Panel: Recent Labs    10/27/22 1401  CHOL 126  HDL 39*  LDLCALC 66  TRIG 161  CHOLHDL 3.2   TSH: No results for input(s): "TSH" in the last 8760 hours. A1C: Lab Results  Component Value Date   HGBA1C 5.8 (H) 04/14/2022     Assessment/Plan 1. Weakness - onset x 3 days - suspect recurrent UTI - afebrile - exam unremarkable - POC Urinalysis Dipstick - CBC with Differential/Platelet - Complete Metabolic Panel with eGFR  2. Urinary frequency - 07/23 urine culture enterococcus faecalis> cipro was stopped and started on nitrofurantoin - afebrile, no CVA tenderness - UA> urine cloudy, leukocytes large 3+ - repeat urine culture today - suspect related to incontinence/brief use - discussed adequate hydration with water - discussed cranberry juice/supplement for prevention - cephALEXin (KEFLEX) 500 MG capsule; Take 1 capsule (500 mg total) by mouth 4 (four) times daily for 5 days.  Dispense: 20 capsule; Refill: 0  Total time: 24 minutes. Greater than 50% of total time spent doing patient education regarding urinary tract infection and weakness including symptom/medication management.     Next appt: 12/23/2022  Hazle Nordmann, Juel Burrow  [facility] & Adult Medicine [phone]

## 2022-12-13 NOTE — Patient Instructions (Signed)
Drink water> 40-60 ounces daily  Drink a small glass of cranberry juice daily for prevention  Please contact provider if you feel worse, develop fevers or have low back/kidney pain

## 2022-12-13 NOTE — Addendum Note (Signed)
Addended by: Meda Klinefelter E on: 12/13/2022 02:22 PM   Modules accepted: Orders

## 2022-12-14 ENCOUNTER — Encounter: Payer: Self-pay | Admitting: Family Medicine

## 2022-12-14 LAB — CBC WITH DIFFERENTIAL/PLATELET
Absolute Monocytes: 1248 cells/uL — ABNORMAL HIGH (ref 200–950)
Basophils Absolute: 62 cells/uL (ref 0–200)
Basophils Relative: 0.6 %
Eosinophils Absolute: 114 cells/uL (ref 15–500)
Eosinophils Relative: 1.1 %
HCT: 38.2 % — ABNORMAL LOW (ref 38.5–50.0)
Hemoglobin: 12.7 g/dL — ABNORMAL LOW (ref 13.2–17.1)
Lymphs Abs: 1550 cells/uL (ref 850–3900)
MCH: 32.2 pg (ref 27.0–33.0)
MCHC: 33.2 g/dL (ref 32.0–36.0)
MCV: 97 fL (ref 80.0–100.0)
MPV: 9.9 fL (ref 7.5–12.5)
Monocytes Relative: 12 %
Neutro Abs: 7426 cells/uL (ref 1500–7800)
Neutrophils Relative %: 71.4 %
Platelets: 266 10*3/uL (ref 140–400)
RBC: 3.94 10*6/uL — ABNORMAL LOW (ref 4.20–5.80)
RDW: 12.3 % (ref 11.0–15.0)
Total Lymphocyte: 14.9 %
WBC: 10.4 10*3/uL (ref 3.8–10.8)

## 2022-12-14 LAB — COMPLETE METABOLIC PANEL WITH GFR
AG Ratio: 1.3 (calc) (ref 1.0–2.5)
ALT: 11 U/L (ref 9–46)
AST: 15 U/L (ref 10–35)
Albumin: 3.8 g/dL (ref 3.6–5.1)
Alkaline phosphatase (APISO): 54 U/L (ref 35–144)
BUN: 21 mg/dL (ref 7–25)
CO2: 25 mmol/L (ref 20–32)
Calcium: 8.9 mg/dL (ref 8.6–10.3)
Chloride: 102 mmol/L (ref 98–110)
Creat: 1.21 mg/dL (ref 0.70–1.22)
Globulin: 2.9 g/dL (calc) (ref 1.9–3.7)
Glucose, Bld: 97 mg/dL (ref 65–99)
Potassium: 4.3 mmol/L (ref 3.5–5.3)
Sodium: 137 mmol/L (ref 135–146)
Total Bilirubin: 0.8 mg/dL (ref 0.2–1.2)
Total Protein: 6.7 g/dL (ref 6.1–8.1)
eGFR: 57 mL/min/{1.73_m2} — ABNORMAL LOW (ref >=60)

## 2022-12-16 ENCOUNTER — Other Ambulatory Visit: Payer: Self-pay | Admitting: Orthopedic Surgery

## 2022-12-16 DIAGNOSIS — R350 Frequency of micturition: Secondary | ICD-10-CM

## 2022-12-17 ENCOUNTER — Other Ambulatory Visit: Payer: Self-pay | Admitting: Orthopedic Surgery

## 2022-12-17 ENCOUNTER — Other Ambulatory Visit: Payer: Self-pay

## 2022-12-17 ENCOUNTER — Telehealth: Payer: Self-pay

## 2022-12-17 DIAGNOSIS — R350 Frequency of micturition: Secondary | ICD-10-CM

## 2022-12-21 ENCOUNTER — Other Ambulatory Visit: Payer: Medicare HMO

## 2022-12-21 DIAGNOSIS — R350 Frequency of micturition: Secondary | ICD-10-CM | POA: Diagnosis not present

## 2022-12-21 NOTE — Telephone Encounter (Signed)
error 

## 2022-12-22 LAB — URINE CULTURE
MICRO NUMBER:: 15357811
Result:: NO GROWTH
SPECIMEN QUALITY:: ADEQUATE

## 2022-12-22 LAB — URINALYSIS
Bilirubin Urine: NEGATIVE
Glucose, UA: NEGATIVE
Hgb urine dipstick: NEGATIVE
Ketones, ur: NEGATIVE
Nitrite: NEGATIVE
Protein, ur: NEGATIVE
Specific Gravity, Urine: 1.015 (ref 1.001–1.035)
pH: 5.5 (ref 5.0–8.0)

## 2022-12-23 ENCOUNTER — Ambulatory Visit (INDEPENDENT_AMBULATORY_CARE_PROVIDER_SITE_OTHER): Payer: Medicare HMO | Admitting: Orthopedic Surgery

## 2022-12-23 ENCOUNTER — Encounter: Payer: Self-pay | Admitting: Orthopedic Surgery

## 2022-12-23 VITALS — BP 108/58 | HR 82 | Temp 98.0°F | Resp 16 | Ht 70.0 in | Wt 218.2 lb

## 2022-12-23 DIAGNOSIS — R309 Painful micturition, unspecified: Secondary | ICD-10-CM

## 2022-12-23 DIAGNOSIS — R350 Frequency of micturition: Secondary | ICD-10-CM | POA: Diagnosis not present

## 2022-12-23 NOTE — Progress Notes (Signed)
Careteam: Patient Care Team: Venita Sheffield, MD as PCP - General (Internal Medicine) Marykay Lex, MD as PCP - Cardiology (Cardiology) Runell Gess, MD as Consulting Physician (Cardiology) Nada Libman, MD as Consulting Physician (Vascular Surgery) Mckinley Jewel, MD as Consulting Physician (Ophthalmology) Alfredo Martinez, MD as Consulting Physician (Urology) Mckinley Jewel, MD as Consulting Physician (Ophthalmology)  Seen by: Hazle Nordmann, AGNP-C  PLACE OF SERVICE:  [facility]  Advanced Directive information Does Patient Have a Medical Advance Directive?: No, Would patient like information on creating a medical advance directive?: No - Patient declined  No Known Allergies  Chief Complaint  Patient presents with   Follow-up    10 day follow up.     HPI: Patient is a 89 y.o. male seen today for acute visit due to ongoing urinary frequency.   08/12 seen for urinary frequency and weakness. Urine was tea colored and cloudy in office. Afebrile, no CVA tenderness. Urine culture negative for infection. WBC 10.4, hgb 12.7, BUN/creat 21/1.21, other labs unremarkable. H/o BPH, he remains on Flomax and supplements to help with prostate health. He is unable to name supplements today. 08/16 he continued to have urinary frequency and repeat urine culture was ordered. 08/20 urine culture negative for infection. Today, he continues to have urinary frequency. He is now having intermittent pain when urinating, not burning but described as "discomfort." Discomfort begins at beginning of voiding and then subsides. Urine appearance varies, sometimes tea colored. Admits symptoms appear to be worse at night. He is having urge to urinate > 4 times a night. Afebrile. Vitals stable.   07/23 UTI> enterococcus faecalis> resolved with nitrofurantoin.    Referral to [facility] made, but he has not scheduled.   Review of Systems:  Review of Systems  Constitutional:  Negative  for fever and malaise/fatigue.  Respiratory:  Negative for cough, shortness of breath and wheezing.   Cardiovascular:  Negative for chest pain and leg swelling.  Genitourinary:  Positive for dysuria and frequency. Negative for flank pain and hematuria.  Musculoskeletal:  Negative for falls.  Neurological:  Negative for dizziness and weakness.  Psychiatric/Behavioral:  Negative for memory loss.     Past Medical History:  Diagnosis Date   Acquired deformity of nose    Calculus of kidney    Cervicalgia    Chronic heart failure with preserved ejection fraction (HFpEF) (HCC)    EF ~45-50%   Esophagitis, unspecified    Essential hypertension    Hyperlipidemia    Hypertrophy of prostate with urinary obstruction and other lower urinary tract symptoms (LUTS)    Insomnia, unspecified    Internal hemorrhoids without mention of complication    Left carotid artery stenosis    S/P L CEA - 2015 (Dr. Myra Gianotti)   Multiple vessel coronary artery disease 2000   ~50% LM-ostLAD with 85% ost-mid LCx, 80% ost RI, 100% CTO RCA -> CABG X 4 (LIMA-LAD, RIMA-dRCA, SVG-OM2, SVG-RI) (Dr. Dorris Fetch) => 06/2019: STEMI : 100% SVG-OM2 (unable to open), 100% OM2; DES PCI LM-mLCx.    Obesity, unspecified    Old myocardial infarction 06/2019   True posterior STEMI -> occluded SVG.  Native LM--LCX PCI   Other and unspecified hyperlipidemia    Rhinophyma 06/05/2014   Unspecified malignant neoplasm of scalp and skin of neck    Unspecified malignant neoplasm of skin of ear and external auditory canal    Unspecified vitamin D deficiency    Urinary frequency    Past Surgical History:  Procedure Laterality Date   CAROTID ENDARTERECTOMY Left 06-14-14   CE   COLONOSCOPY  2007   Dr Virginia Rochester, normal   CORONARY ARTERY BYPASS GRAFT  2000   Dr. Dorris Fetch - CABG x 4:  LIMA-LAD, RIMA-dRCA, SVG-OM2, SVG-RI   CORONARY STENT INTERVENTION N/A 06/22/2019   Procedure: CORONARY STENT INTERVENTION;  Surgeon: Marykay Lex, MD;   Location: [facility] INVASIVE CV LAB; unsuccessful attempt at revascularization of SVG-OM2 => Synergy DES PCI from LM-mid LCx (Synergy DES 2.5 mm x 38 mm--> 3.1-2.6 mm); unfortunately OM2 was occluded.  This revascularized OM1, OM 3 and OM 4.   CORONARY/GRAFT ACUTE MI REVASCULARIZATION N/A 06/22/2019   Procedure: CORONARY/GRAFT ACUTE MI REVASCULARIZATION;  Surgeon: Marykay Lex, MD;  Location: [facility] INVASIVE CV LAB;; unsuccessful PTCA/aspiration thrombectomy of SVG-OM1 as culprit lesion for STEMI   ENDARTERECTOMY Left 06/14/2014   Procedure: LEFT CAROTID ENDARTERECTOMY WITH VASCUGUARD PATCH ANGIOPLASTY;  Surgeon: Nada Libman, MD;  Location: [facility] OR;  Service: Vascular;  Laterality: Left;   ESOPHAGOGASTRODUODENOSCOPY ENDOSCOPY  2007   Dr Virginia Rochester, with biopsy   EXCISIONAL HEMORRHOIDECTOMY  1981   LEFT HEART CATH AND CORONARY ANGIOGRAPHY  2000   MV CAD --> CABG x 4   LEFT HEART CATH AND CORS/GRAFTS ANGIOGRAPHY N/A 06/22/2019   Procedure: LEFT HEART CATH AND CORS/GRAFTS ANGIOGRAPHY;  Surgeon: Marykay Lex, MD;  CULPRIT LESION = 100% SVG-OM2; Severe native CAD: mLM-Ost LAD-50% w/ Ost LCx 85% (DES PCI OM-OstLCx)- Treated b/c 100% TO SVG-OM2 as Culprit lesion - unable to open. OM2 100%. 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  GR 1 DD.  No obvious R WMA.  Essentially normal valves.  Mild MR.   trench in right side of jaw after abcesses  06/01/2016   Social History:   reports that he quit smoking about 49 years ago. His smoking use included cigarettes. He started smoking about 74 years ago. He has a 50 pack-year smoking history. He has never been exposed to tobacco smoke.  He has never used smokeless tobacco. He reports current alcohol use of about 4.0 standard drinks of alcohol per week. He reports that he does not use drugs.  Family History  Problem Relation Age of Onset   Cancer Mother        LIVER   Cancer Brother        PROSTATE   Cancer Daughter     Medications: Patient's Medications  New Prescriptions   No medications on file  Previous Medications   ALPHA-LIPOIC ACID 200 MG CAPS    Take 200 mg by mouth.   ASCORBIC ACID (VITAMIN C) 500 MG TABLET    Take 500-1,000 mg by mouth daily.   B COMPLEX VITAMINS TABLET    Take 1 tablet by mouth daily.   BOSWELLIA SERRATA (BOSWELLIA PO)    Take 800 mg by mouth.   CHOLECALCIFEROL (VITAMIN D3) 125 MCG (5000 UT) CAPS    Take 5,000 Units by mouth daily.   CINNAMON 500 MG TABS    Take 500 mg by mouth 3 (three) times a week.    CITRUS BERGAMOT PO  Take 1 capsule by mouth 2 (two) times a week.   CLOPIDOGREL (PLAVIX) 75 MG TABLET    TAKE 1 TABLET(75 MG) BY MOUTH DAILY   COENZYME Q10 (CO Q 10) 100 MG CAPS    Take 100 mg by mouth daily.    CYANOCOBALAMIN (VITAMIN B-12 PO)    Take 1 tablet by mouth daily.   DIAZEPAM (VALIUM) 5 MG TABLET    TAKE 1/2 TABLET(2.5 MG) BY MOUTH TWICE DAILY   DOXEPIN (SINEQUAN) 75 MG CAPSULE    Take 1 capsule (75 mg total) by mouth at bedtime.   EZETIMIBE (ZETIA) 10 MG TABLET    Take 1 tablet (10 mg total) by mouth daily.   FLUTICASONE-SALMETEROL (ADVAIR) 250-50 MCG/DOSE AEPB    Inhale 1 puff into the lungs once a week.    GRAPE SEED OIL    Take 1 capsule by mouth daily.   GREEN TEA, CAMELLIA SINENSIS, (GREEN TEA EXTRACT PO)    Take 1 capsule by mouth daily.   METOPROLOL SUCCINATE (TOPROL-XL) 50 MG 24 HR TABLET    Take one tablet by mouth once daily. Take with or immediately following a meal.   NITROGLYCERIN (NITROSTAT) 0.4 MG SL TABLET    Place 1 tablet (0.4 mg total) under the tongue every 5 (five) minutes as needed.   RAMIPRIL (ALTACE) 10 MG CAPSULE    TAKE 1 CAPSULE BY MOUTH EVERY DAY    RESVERATROL 100 MG CAPS    Take 100 mg by mouth daily.    ROSUVASTATIN (CRESTOR) 40 MG TABLET    Take 1 tablet (40 mg total) by mouth daily.   TAMSULOSIN (FLOMAX) 0.4 MG CAPS CAPSULE    Take 1 capsule (0.4 mg total) by mouth daily.   TURMERIC PO    Take 1 capsule by mouth as needed.    ZINC GLUCONATE 50 MG TABLET    Take 50 mg by mouth daily.  Modified Medications   No medications on file  Discontinued Medications   No medications on file    Physical Exam:  Vitals:   12/23/22 1412  BP: (!) 108/58  Pulse: 82  Resp: 16  Temp: 98 F (36.7 C)  SpO2: 98%  Weight: 218 lb 3.2 oz (99 kg)  Height: 5\' 10"  (1.778 m)   Body mass index is 31.31 kg/m. Wt Readings from Last 3 Encounters:  12/23/22 218 lb 3.2 oz (99 kg)  12/13/22 221 lb (100.2 kg)  11/23/22 226 lb (102.5 kg)    Physical Exam Vitals reviewed.  Constitutional:      General: He is not in acute distress. HENT:     Head: Normocephalic.  Eyes:     General:        Right eye: No discharge.        Left eye: No discharge.  Cardiovascular:     Rate and Rhythm: Normal rate and regular rhythm.     Pulses: Normal pulses.     Heart sounds: Normal heart sounds.  Pulmonary:     Effort: Pulmonary effort is normal. No respiratory distress.     Breath sounds: Normal breath sounds. No wheezing.  Abdominal:     General: Bowel sounds are normal.     Palpations: Abdomen is soft.  Musculoskeletal:     Cervical back: Neck supple.     Right lower leg: No edema.     Left lower leg: No edema.  Skin:    General: Skin is warm.     Capillary Refill: Capillary  refill takes less than 2 seconds.  Neurological:     General: No focal deficit present.     Mental Status: He is alert. Mental status is at baseline.  Psychiatric:        Mood and Affect: Mood normal.     Labs reviewed: Basic Metabolic Panel: Recent Labs    10/27/22 1401 11/23/22 1400 12/13/22 1442  NA 142 138 137  K 3.9 4.1 4.3  CL 106 101 102  CO2 28 28 25    GLUCOSE 97 94 97  BUN 14 15 21   CREATININE 0.98 0.97 1.21  CALCIUM 9.3 9.1 8.9   Liver Function Tests: Recent Labs    10/27/22 1401 11/23/22 1400 12/13/22 1442  AST 17 15 15   ALT 14 11 11   BILITOT 0.5 0.6 0.8  PROT 6.5 6.9 6.7   No results for input(s): "LIPASE", "AMYLASE" in the last 8760 hours. No results for input(s): "AMMONIA" in the last 8760 hours. CBC: Recent Labs    11/23/22 1400 12/13/22 1442  WBC 16.8* 10.4  NEUTROABS 14,028* 7,426  HGB 12.8* 12.7*  HCT 37.5* 38.2*  MCV 95.9 97.0  PLT 233 266   Lipid Panel: Recent Labs    10/27/22 1401  CHOL 126  HDL 39*  LDLCALC 66  TRIG 696  CHOLHDL 3.2   TSH: No results for input(s): "TSH" in the last 8760 hours. A1C: Lab Results  Component Value Date   HGBA1C 5.8 (H) 04/14/2022     Assessment/Plan 1. Urinary frequency - ongoing, increased frequency - 07/23 urine culture > enterococcus faecalis> resolved with nitrofurantoin - 08/20 UA cloudy, 2+ leukocytes - 08/20 urine culture negative - suspect associated with worsening BPH - [facility] referral 2016 per Dr. Chilton Si - seen by Dr. Sherron Monday in past - cont Flomax - unable to name prostate supplements - new urology referral made> he has not scheduled  2. Painful micturition - discomfort at beginning - see above  Total time: 32 minutes. Greater than 50% of total time spent doing patient education regarding urinary frequency, BPH, and painful micturition including symptom/medication management.    Next appt: 04/21/2023  Hazle Nordmann, Juel Burrow  [facility] Adult Medicine [phone]

## 2022-12-23 NOTE — Patient Instructions (Addendum)
[facility] [phone] - call to schedule appointment

## 2023-01-24 ENCOUNTER — Other Ambulatory Visit: Payer: Self-pay | Admitting: *Deleted

## 2023-01-24 DIAGNOSIS — N134 Hydroureter: Secondary | ICD-10-CM | POA: Diagnosis not present

## 2023-01-24 DIAGNOSIS — R3914 Feeling of incomplete bladder emptying: Secondary | ICD-10-CM | POA: Diagnosis not present

## 2023-01-24 DIAGNOSIS — R3915 Urgency of urination: Secondary | ICD-10-CM | POA: Diagnosis not present

## 2023-01-24 DIAGNOSIS — N202 Calculus of kidney with calculus of ureter: Secondary | ICD-10-CM | POA: Diagnosis not present

## 2023-01-24 DIAGNOSIS — N130 Hydronephrosis with ureteropelvic junction obstruction: Secondary | ICD-10-CM | POA: Diagnosis not present

## 2023-01-24 DIAGNOSIS — N132 Hydronephrosis with renal and ureteral calculous obstruction: Secondary | ICD-10-CM | POA: Diagnosis not present

## 2023-01-24 DIAGNOSIS — N1330 Unspecified hydronephrosis: Secondary | ICD-10-CM | POA: Diagnosis not present

## 2023-01-24 DIAGNOSIS — I10 Essential (primary) hypertension: Secondary | ICD-10-CM

## 2023-01-24 DIAGNOSIS — R350 Frequency of micturition: Secondary | ICD-10-CM | POA: Diagnosis not present

## 2023-01-24 MED ORDER — METOPROLOL SUCCINATE ER 50 MG PO TB24
ORAL_TABLET | ORAL | 1 refills | Status: DC
Start: 2023-01-24 — End: 2023-07-28

## 2023-01-24 NOTE — Telephone Encounter (Signed)
[facility] requested refill.

## 2023-01-25 DIAGNOSIS — N130 Hydronephrosis with ureteropelvic junction obstruction: Secondary | ICD-10-CM | POA: Diagnosis not present

## 2023-01-28 ENCOUNTER — Other Ambulatory Visit: Payer: Self-pay | Admitting: Urology

## 2023-01-28 ENCOUNTER — Telehealth: Payer: Self-pay | Admitting: *Deleted

## 2023-01-28 NOTE — Telephone Encounter (Signed)
1st attempt to reach pt regarding surgical clearance and the need for a tele visit.  Left a message for pt to call back and ask for the preop team. 

## 2023-01-28 NOTE — Telephone Encounter (Signed)
   Pre-operative Risk Assessment    Patient Name: Alex [name]  DOB: [birth year] MRN: [id number]    DATE OF LAST VISIT: 10/20/22 HAO MENG, PAC DATE OF NEXT VISIT: NONE  Request for Surgical Clearance    Procedure:   CYSTOLITHOLAPAXY, LEFT URETEROSCOPY  & LASER LITHO  Date of Surgery:  Clearance 02/17/23                                 Surgeon:  DR. Jennette Bill Surgeon's Group or Practice Name:  [facility] Phone number:  [phone] Fax number:  [phone]   Type of Clearance Requested:   - Medical ; PLAVIX    Type of Anesthesia:  Not Indicated   Additional requests/questions:    Elpidio Anis   01/28/2023, 2:12 PM

## 2023-01-28 NOTE — Telephone Encounter (Signed)
   Name: Alex [name]  DOB: 1931/05/04  MRN: [id number]  Primary Cardiologist: Bryan Lemma, MD   Preoperative team, please contact this patient and set up a phone call appointment for further preoperative risk assessment. Please obtain consent and complete medication review. Thank you for your help.  I confirm that guidance regarding antiplatelet and oral anticoagulation therapy has been completed and, if necessary, noted below.  Previously cleared to hold plavix 5 days.   I also confirmed the patient resides in the state of [place]. As per [facility] [employer] telemedicine laws, the patient must reside in the state in which the provider is licensed.   Marcelino Duster, PA 01/28/2023, 2:17 PM [facility]

## 2023-02-02 ENCOUNTER — Telehealth: Payer: Self-pay | Admitting: *Deleted

## 2023-02-02 NOTE — Telephone Encounter (Signed)
Pt has been scheduled for tele pre op appt 02/11/23 @ 10:40, ok per Robin Searing, NP due to med hold and date of procedure.          Patient Consent for Virtual Visit        Alex [name] has provided verbal consent on 02/02/2023 for a virtual visit (video or telephone).   CONSENT FOR VIRTUAL VISIT FOR:  Alex [name]  By participating in this virtual visit I agree to the following:  I hereby voluntarily request, consent and authorize [facility] and its employed or contracted physicians, physician assistants, nurse practitioners or other licensed health care professionals (the Practitioner), to provide me with telemedicine health care services (the "Services") as deemed necessary by the treating Practitioner. I acknowledge and consent to receive the Services by the Practitioner via telemedicine. I understand that the telemedicine visit will involve communicating with the Practitioner through live audiovisual communication technology and the disclosure of certain medical information by electronic transmission. I acknowledge that I have been given the opportunity to request an in-person assessment or other available alternative prior to the telemedicine visit and am voluntarily participating in the telemedicine visit.  I understand that I have the right to withhold or withdraw my consent to the use of telemedicine in the course of my care at any time, without affecting my right to future care or treatment, and that the Practitioner or I may terminate the telemedicine visit at any time. I understand that I have the right to inspect all information obtained and/or recorded in the course of the telemedicine visit and may receive copies of available information for a reasonable fee.  I understand that some of the potential risks of receiving the Services via telemedicine include:  Delay or interruption in medical evaluation due to technological equipment failure or disruption; Information  transmitted may not be sufficient (e.g. poor resolution of images) to allow for appropriate medical decision making by the Practitioner; and/or  In rare instances, security protocols could fail, causing a breach of personal health information.  Furthermore, I acknowledge that it is my responsibility to provide information about my medical history, conditions and care that is complete and accurate to the best of my ability. I acknowledge that Practitioner's advice, recommendations, and/or decision may be based on factors not within their control, such as incomplete or inaccurate data provided by me or distortions of diagnostic images or specimens that may result from electronic transmissions. I understand that the practice of medicine is not an exact science and that Practitioner makes no warranties or guarantees regarding treatment outcomes. I acknowledge that a copy of this consent can be made available to me via my patient portal [facility] MyChart), or I can request a printed copy by calling the office of [facility].    I understand that my insurance will be billed for this visit.   I have read or had this consent read to me. I understand the contents of this consent, which adequately explains the benefits and risks of the Services being provided via telemedicine.  I have been provided ample opportunity to ask questions regarding this consent and the Services and have had my questions answered to my satisfaction. I give my informed consent for the services to be provided through the use of telemedicine in my medical care

## 2023-02-02 NOTE — Telephone Encounter (Signed)
Pt has been scheduled for tele pre op appt 02/11/23 @ 10:40, ok per Robin Searing, NP due to med hold and date of procedure.     Patient Consent for Virtual Visit        Alex [name] has provided verbal consent on 02/02/2023 for a virtual visit (video or telephone).   CONSENT FOR VIRTUAL VISIT FOR:  Alex [name]  By participating in this virtual visit I agree to the following:  I hereby voluntarily request, consent and authorize [facility] and its employed or contracted physicians, physician assistants, nurse practitioners or other licensed health care professionals (the Practitioner), to provide me with telemedicine health care services (the "Services") as deemed necessary by the treating Practitioner. I acknowledge and consent to receive the Services by the Practitioner via telemedicine. I understand that the telemedicine visit will involve communicating with the Practitioner through live audiovisual communication technology and the disclosure of certain medical information by electronic transmission. I acknowledge that I have been given the opportunity to request an in-person assessment or other available alternative prior to the telemedicine visit and am voluntarily participating in the telemedicine visit.  I understand that I have the right to withhold or withdraw my consent to the use of telemedicine in the course of my care at any time, without affecting my right to future care or treatment, and that the Practitioner or I may terminate the telemedicine visit at any time. I understand that I have the right to inspect all information obtained and/or recorded in the course of the telemedicine visit and may receive copies of available information for a reasonable fee.  I understand that some of the potential risks of receiving the Services via telemedicine include:  Delay or interruption in medical evaluation due to technological equipment failure or disruption; Information transmitted may  not be sufficient (e.g. poor resolution of images) to allow for appropriate medical decision making by the Practitioner; and/or  In rare instances, security protocols could fail, causing a breach of personal health information.  Furthermore, I acknowledge that it is my responsibility to provide information about my medical history, conditions and care that is complete and accurate to the best of my ability. I acknowledge that Practitioner's advice, recommendations, and/or decision may be based on factors not within their control, such as incomplete or inaccurate data provided by me or distortions of diagnostic images or specimens that may result from electronic transmissions. I understand that the practice of medicine is not an exact science and that Practitioner makes no warranties or guarantees regarding treatment outcomes. I acknowledge that a copy of this consent can be made available to me via my patient portal [facility] MyChart), or I can request a printed copy by calling the office of [facility].    I understand that my insurance will be billed for this visit.   I have read or had this consent read to me. I understand the contents of this consent, which adequately explains the benefits and risks of the Services being provided via telemedicine.  I have been provided ample opportunity to ask questions regarding this consent and the Services and have had my questions answered to my satisfaction. I give my informed consent for the services to be provided through the use of telemedicine in my medical care

## 2023-02-11 ENCOUNTER — Ambulatory Visit: Payer: Medicare HMO | Attending: Cardiology | Admitting: Nurse Practitioner

## 2023-02-11 DIAGNOSIS — Z01810 Encounter for preprocedural cardiovascular examination: Secondary | ICD-10-CM

## 2023-02-11 NOTE — Progress Notes (Signed)
Virtual Visit via Telephone Note   Because of Jaime Grizzell Littles's co-morbid illnesses, he is at least at moderate risk for complications without adequate follow up.  This format is felt to be most appropriate for this patient at this time.  The patient did not have access to video technology/had technical difficulties with video requiring transitioning to audio format only (telephone).  All issues noted in this document were discussed and addressed.  No physical exam could be performed with this format.  Please refer to the patient's chart for his consent to telehealth for [facility].  Evaluation Performed:  Preoperative cardiovascular risk assessment _____________   Date:  02/11/2023   Patient ID:  Alex [name], DOB [birth year], MRN [id number] Patient Location:  Home Provider location:   Office  Primary Care Provider:  Octavia Heir, NP Primary Cardiologist:  Bryan Lemma, MD  Chief Complaint / Patient Profile   89 y.o. y/o male with a h/o CAD s/p CABG x 4 in 2000 (LIMA-LAD, RIMA-distal RCA, SVG-OM2, SVG-RI), hypertension, hyperlipidemia, carotid artery stenosis s/p left CEA, chronic bilateral lower extremity edema and ischemic cardiomyopathy  who is pending CYSTOLITHOLAPAXY, LEFT URETEROSCOPY  & LASER LITHO on 02/17/2023 with Dr. Jennette Bill of [facility] and presents today for telephonic preoperative cardiovascular risk assessment.  History of Present Illness    JEDEDIAH NODA is a 89 y.o. male who presents via audio/video conferencing for a telehealth visit today.  Pt was last seen in cardiology clinic on 10/20/2022 by Azalee Course, PA.  At that time AZARION HOVE was doing well.  The patient is now pending procedure as outlined above. Since his last visit, he has done well from a cardiac standpoint.   He denies chest pain, palpitations, dyspnea, pnd, orthopnea, n, v, dizziness, syncope, edema, weight gain, or early satiety. All other systems reviewed and are otherwise negative except  as noted above.   Past Medical History    Past Medical History:  Diagnosis Date   Acquired deformity of nose    Calculus of kidney    Cervicalgia    Chronic heart failure with preserved ejection fraction (HFpEF) (HCC)    EF ~45-50%   Esophagitis, unspecified    Essential hypertension    Hyperlipidemia    Hypertrophy of prostate with urinary obstruction and other lower urinary tract symptoms (LUTS)    Insomnia, unspecified    Internal hemorrhoids without mention of complication    Left carotid artery stenosis    S/P L CEA - 2015 (Dr. Myra Gianotti)   Multiple vessel coronary artery disease 2000   ~50% LM-ostLAD with 85% ost-mid LCx, 80% ost RI, 100% CTO RCA -> CABG X 4 (LIMA-LAD, RIMA-dRCA, SVG-OM2, SVG-RI) (Dr. Dorris Fetch) => 06/2019: STEMI : 100% SVG-OM2 (unable to open), 100% OM2; DES PCI LM-mLCx.    Obesity, unspecified    Old myocardial infarction 06/2019   True posterior STEMI -> occluded SVG.  Native LM--LCX PCI   Other and unspecified hyperlipidemia    Rhinophyma 06/05/2014   Unspecified malignant neoplasm of scalp and skin of neck    Unspecified malignant neoplasm of skin of ear and external auditory canal    Unspecified vitamin D deficiency    Urinary frequency    Past Surgical History:  Procedure Laterality Date   CAROTID ENDARTERECTOMY Left 06-14-14   CE   COLONOSCOPY  2007   Dr Virginia Rochester, normal   CORONARY ARTERY BYPASS GRAFT  2000   Dr. Dorris Fetch - CABG x 4:  LIMA-LAD, RIMA-dRCA,  SVG-OM2, SVG-RI   CORONARY STENT INTERVENTION N/A 06/22/2019   Procedure: CORONARY STENT INTERVENTION;  Surgeon: Marykay Lex, MD;  Location: [facility] INVASIVE CV LAB; unsuccessful attempt at revascularization of SVG-OM2 => Synergy DES PCI from LM-mid LCx (Synergy DES 2.5 mm x 38 mm--> 3.1-2.6 mm); unfortunately OM2 was occluded.  This revascularized OM1, OM 3 and OM 4.   CORONARY/GRAFT ACUTE MI REVASCULARIZATION N/A 06/22/2019   Procedure: CORONARY/GRAFT ACUTE MI REVASCULARIZATION;  Surgeon: Marykay Lex, MD;  Location: [facility] INVASIVE CV LAB;; unsuccessful PTCA/aspiration thrombectomy of SVG-OM1 as culprit lesion for STEMI   ENDARTERECTOMY Left 06/14/2014   Procedure: LEFT CAROTID ENDARTERECTOMY WITH VASCUGUARD PATCH ANGIOPLASTY;  Surgeon: Nada Libman, MD;  Location: [facility] OR;  Service: Vascular;  Laterality: Left;   ESOPHAGOGASTRODUODENOSCOPY ENDOSCOPY  2007   Dr Virginia Rochester, with biopsy   EXCISIONAL HEMORRHOIDECTOMY  1981   LEFT HEART CATH AND CORONARY ANGIOGRAPHY  2000   MV CAD --> CABG x 4   LEFT HEART CATH AND CORS/GRAFTS ANGIOGRAPHY N/A 06/22/2019   Procedure: LEFT HEART CATH AND CORS/GRAFTS ANGIOGRAPHY;  Surgeon: Marykay Lex, MD;  CULPRIT LESION = 100% SVG-OM2; Severe native CAD: mLM-Ost LAD-50% w/ Ost LCx 85% (DES PCI OM-OstLCx)- Treated b/c 100% TO SVG-OM2 as Culprit lesion - unable to open. OM2 100%. 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  GR 1 DD.  No obvious R WMA.  Essentially normal valves.  Mild MR.   trench in right side of jaw after abcesses  06/01/2016    Allergies  No Known Allergies  Home Medications    Prior to Admission medications   Medication Sig Start Date End Date Taking? Authorizing Provider  Alpha-Lipoic Acid 200 MG CAPS Take 200 mg by mouth.    [provider]  ascorbic acid (VITAMIN C) 500 MG tablet Take 500-1,000 mg by mouth daily.    [provider]  b complex vitamins tablet Take 1 tablet by mouth daily.    [provider]  Boswellia Serrata (BOSWELLIA PO) Take 800 mg by mouth.    [provider]  Cholecalciferol (VITAMIN D3) 125 MCG (5000 UT) CAPS Take 5,000 Units by mouth  daily.    [provider]  Cinnamon 500 MG TABS Take 500 mg by mouth 3 (three) times a week.     [provider]  CITRUS BERGAMOT PO Take 1 capsule by mouth 2 (two) times a week.    [provider]  clopidogrel (PLAVIX) 75 MG tablet TAKE 1 TABLET(75 MG) BY MOUTH DAILY 04/27/22   Marykay Lex, MD  Coenzyme Q10 (CO Q 10) 100 MG CAPS Take 100 mg by mouth daily.     [provider]  Cyanocobalamin (VITAMIN B-12 PO) Take 1 tablet by mouth daily.    [provider]  diazepam (VALIUM) 5 MG tablet TAKE 1/2 TABLET(2.5 MG) BY MOUTH TWICE DAILY 12/13/22   Frederica Kuster, MD  doxepin (SINEQUAN) 75 MG capsule Take 1 capsule (75 mg total) by mouth at bedtime. 10/20/22   Frederica Kuster, MD  ezetimibe (ZETIA) 10 MG tablet Take 1 tablet (10 mg  total) by mouth daily. 07/23/20   Marykay Lex, MD  Fluticasone-Salmeterol (ADVAIR) 250-50 MCG/DOSE AEPB Inhale 1 puff into the lungs once a week.     [provider]  Grape Seed OIL Take 1 capsule by mouth daily.    [provider]  Chilton Si Tea, Camellia sinensis, (GREEN TEA EXTRACT PO) Take 1 capsule by mouth daily.    [provider]  metoprolol succinate (TOPROL-XL) 50 MG 24 hr tablet Take one tablet by mouth once daily. Take with or immediately following a meal. 01/24/23   Fargo, Amy E, NP  nitroGLYCERIN (NITROSTAT) 0.4 MG SL tablet Place 1 tablet (0.4 mg total) under the tongue every 5 (five) minutes as needed. 06/25/19   Marykay Lex, MD  ramipril (ALTACE) 10 MG capsule TAKE 1 CAPSULE BY MOUTH EVERY DAY 10/05/22   Frederica Kuster, MD  RESVERATROL 100 MG CAPS Take 100 mg by mouth daily.     [provider]  rosuvastatin (CRESTOR) 40 MG tablet Take 1 tablet (40 mg total) by mouth daily. 10/29/22   Marykay Lex, MD  tamsulosin (FLOMAX) 0.4 MG CAPS capsule Take 1 capsule (0.4 mg total) by mouth daily. 08/04/22   Frederica Kuster, MD  TURMERIC PO Take 1 capsule by mouth as needed.      [provider]  zinc gluconate 50 MG tablet Take 50 mg by mouth daily.    [provider]    Physical Exam    Vital Signs:  DENYM RAHIMI does not have vital signs available for review today.  Given telephonic nature of communication, physical exam is limited. AAOx3. NAD. Normal affect.  Speech and respirations are unlabored.  Accessory Clinical Findings    None  Assessment & Plan    1.  Preoperative Cardiovascular Risk Assessment:  According to the Revised Cardiac Risk Index (RCRI), his Perioperative Risk of Major Cardiac Event is (%): 0.9. His Functional Capacity in METs is: 5.62 according to the [facility] Activity Status Index (DASI). Therefore, based on ACC/AHA guidelines, patient would be at acceptable risk for the planned procedure without further cardiovascular testing.  The patient was advised that if he develops new symptoms prior to surgery to contact our office to arrange for a follow-up visit, and he verbalized understanding.  Per office protocol, he may hold Plavix for 5 days prior to procedure. Please resume Plavix as soon as possible postprocedure, at the discretion of the surgeon.   A copy of this note will be routed to requesting surgeon.  Time:   Today, I have spent 5 minutes with the patient with telehealth technology discussing medical history, symptoms, and management plan.     Joylene Grapes, NP  02/11/2023, 10:46 AM

## 2023-02-14 ENCOUNTER — Encounter (HOSPITAL_BASED_OUTPATIENT_CLINIC_OR_DEPARTMENT_OTHER): Payer: Self-pay | Admitting: Urology

## 2023-02-15 ENCOUNTER — Encounter (HOSPITAL_BASED_OUTPATIENT_CLINIC_OR_DEPARTMENT_OTHER): Payer: Self-pay | Admitting: Urology

## 2023-02-15 NOTE — Progress Notes (Signed)
Spoke w/ via phone for pre-op interview--- pt Lab needs dos----    [employer] results------ current EKG in epic/ chart COVID test -----patient states asymptomatic no test needed Arrive at ------- 0830 on 02-17-2023 NPO after MN NO Solid Food.  Clear liquids from MN until--- 0730 Med rec completed Medications to take morning of surgery ----- toprol, proscar, crestor Diabetic medication ----- n/a Patient instructed no nail polish to be worn day of surgery Patient instructed to bring photo id and insurance card day of surgery Patient aware to have Driver (ride ) / caregiver    for 24 hours after surgery - son, stephen Patient Special Instructions ----- asked to bring advair inhaler Pre-Op special Instructions ----- pt has telephone cardiac clearance by Bernadene Person NP on 02-11-2023 in epic/ chart Patient verbalized understanding of instructions that were given at this phone interview. Patient denies chest pain, sob, fever, cough at the interview.    Anesthesia Review:  HTN;  CAD  s/p CABG x4 05/ 2000 and posterior STEMI 06-22-2019 SVG-OM occluded graft, PCI w/ DES x1 to LM--mLCx;  ICM (last ef 45-50%);  s/p L CEA 2016;  emphysema;   Pt denies cardiac s&s, no sob, but does have peripheral swelling.  Stated was able to walk , do stairs, yard work, and house hold chores without sob.  Also, stated has never taken a nitroglycerin.  Per pt only had advair inhaler prn,  last used several months ago.   PCP:  Hazle Nordmann NP Cardiologist : Dr Herbie Baltimore Theron Arista 10-17-2022) Chest x-ray : 06-22-2019 EKG : 10-20-2022 Echo : 06-23-2019 Stress test: 10-20-2022 Cardiac Cath :  06-22-2019 Activity level:  see above Sleep Study/ CPAP : no  Blood Thinner/ Instructions Maurice Small Dose: plavix ASA / Instructions/ Last Dose : no Per pt was given instructions from dr Jennette Bill office to stop 5 days prior to surgery, stated last dose 02-11-2023

## 2023-02-17 ENCOUNTER — Ambulatory Visit (HOSPITAL_BASED_OUTPATIENT_CLINIC_OR_DEPARTMENT_OTHER): Payer: Medicare HMO | Admitting: Certified Registered Nurse Anesthetist

## 2023-02-17 ENCOUNTER — Encounter (HOSPITAL_BASED_OUTPATIENT_CLINIC_OR_DEPARTMENT_OTHER): Payer: Self-pay | Admitting: Urology

## 2023-02-17 ENCOUNTER — Encounter (HOSPITAL_COMMUNITY)
Admission: RE | Disposition: A | Discharge status: Home / Self Care | Payer: Self-pay | Source: Home / Self Care | Attending: Urology

## 2023-02-17 ENCOUNTER — Observation Stay (HOSPITAL_BASED_OUTPATIENT_CLINIC_OR_DEPARTMENT_OTHER)
Admission: RE | Admit: 2023-02-17 | Discharge: 2023-02-18 | Disposition: A | Discharge status: Home / Self Care | Payer: Medicare HMO | Attending: Urology | Admitting: Urology

## 2023-02-17 ENCOUNTER — Observation Stay (HOSPITAL_COMMUNITY): Payer: Medicare HMO

## 2023-02-17 ENCOUNTER — Other Ambulatory Visit: Payer: Self-pay

## 2023-02-17 DIAGNOSIS — Z951 Presence of aortocoronary bypass graft: Secondary | ICD-10-CM | POA: Diagnosis not present

## 2023-02-17 DIAGNOSIS — N200 Calculus of kidney: Secondary | ICD-10-CM | POA: Diagnosis not present

## 2023-02-17 DIAGNOSIS — N210 Calculus in bladder: Secondary | ICD-10-CM

## 2023-02-17 DIAGNOSIS — I2510 Atherosclerotic heart disease of native coronary artery without angina pectoris: Secondary | ICD-10-CM | POA: Insufficient documentation

## 2023-02-17 DIAGNOSIS — Z79899 Other long term (current) drug therapy: Secondary | ICD-10-CM | POA: Insufficient documentation

## 2023-02-17 DIAGNOSIS — Z01818 Encounter for other preprocedural examination: Principal | ICD-10-CM

## 2023-02-17 DIAGNOSIS — Q6269 Other malposition of ureter: Secondary | ICD-10-CM | POA: Diagnosis not present

## 2023-02-17 DIAGNOSIS — I10 Essential (primary) hypertension: Secondary | ICD-10-CM | POA: Diagnosis not present

## 2023-02-17 DIAGNOSIS — Z7982 Long term (current) use of aspirin: Secondary | ICD-10-CM | POA: Insufficient documentation

## 2023-02-17 DIAGNOSIS — N201 Calculus of ureter: Principal | ICD-10-CM | POA: Diagnosis present

## 2023-02-17 HISTORY — DX: Prediabetes: R73.03

## 2023-02-17 HISTORY — DX: Localized edema: R60.0

## 2023-02-17 HISTORY — DX: Calculus in bladder: N21.0

## 2023-02-17 HISTORY — PX: IR NEPHROSTOMY PLACEMENT LEFT: IMG6063

## 2023-02-17 HISTORY — DX: Personal history of other malignant neoplasm of skin: Z85.828

## 2023-02-17 HISTORY — DX: Calculus of ureter: N20.1

## 2023-02-17 HISTORY — DX: Other complications of anesthesia, initial encounter: T88.59XA

## 2023-02-17 HISTORY — DX: Presence of spectacles and contact lenses: Z97.3

## 2023-02-17 HISTORY — DX: Personal history of urinary calculi: Z87.442

## 2023-02-17 HISTORY — DX: Unspecified osteoarthritis, unspecified site: M19.90

## 2023-02-17 HISTORY — DX: Emphysema, unspecified: J43.9

## 2023-02-17 HISTORY — PX: CYSTOSCOPY WITH LITHOLAPAXY: SHX1425

## 2023-02-17 HISTORY — DX: Ischemic cardiomyopathy: I25.5

## 2023-02-17 HISTORY — DX: Benign prostatic hyperplasia with lower urinary tract symptoms: N40.1

## 2023-02-17 HISTORY — DX: Personal history of other diseases of the digestive system: Z87.19

## 2023-02-17 HISTORY — PX: CYSTOSCOPY/URETEROSCOPY/HOLMIUM LASER/STENT PLACEMENT: SHX6546

## 2023-02-17 LAB — POCT I-STAT, CHEM 8
BUN: 15 mg/dL (ref 8–23)
Calcium, Ion: 1.22 mmol/L (ref 1.15–1.40)
Chloride: 106 mmol/L (ref 98–111)
Creatinine, Ser: 1.2 mg/dL (ref 0.61–1.24)
Glucose, Bld: 99 mg/dL (ref 70–99)
HCT: 36 % — ABNORMAL LOW (ref 39.0–52.0)
Hemoglobin: 12.2 g/dL — ABNORMAL LOW (ref 13.0–17.0)
Potassium: 3.7 mmol/L (ref 3.5–5.1)
Sodium: 143 mmol/L (ref 135–145)
TCO2: 23 mmol/L (ref 22–32)

## 2023-02-17 SURGERY — CYSTOSCOPY, WITH BLADDER CALCULUS LITHOLAPAXY
Anesthesia: General | Site: Renal

## 2023-02-17 MED ORDER — FENTANYL CITRATE (PF) 250 MCG/5ML IJ SOLN
INTRAMUSCULAR | Status: DC | PRN
  Administered 2023-02-17 (×2): 25 ug via INTRAVENOUS
  Administered 2023-02-17: 50 ug via INTRAVENOUS
  Administered 2023-02-17 (×4): 25 ug via INTRAVENOUS

## 2023-02-17 MED ORDER — SODIUM CHLORIDE 0.9 % IR SOLN
Status: DC | PRN
  Administered 2023-02-17: 3000 mL
  Administered 2023-02-17 (×3): 6000 mL

## 2023-02-17 MED ORDER — MELATONIN 3 MG PO TABS
3.0000 mg | ORAL_TABLET | Freq: Every day | ORAL | Status: DC
  Administered 2023-02-17: 3 mg via ORAL
  Filled 2023-02-17: qty 1

## 2023-02-17 MED ORDER — OXYCODONE HCL 5 MG/5ML PO SOLN: 5.0000 mg | Freq: Once | ORAL | Status: DC | PRN

## 2023-02-17 MED ORDER — SUCCINYLCHOLINE 20MG/ML (10ML) SYRINGE FOR MEDFUSION PUMP - OPTIME
INTRAMUSCULAR | Status: DC | PRN
Start: 2023-02-17 — End: 2023-02-17
  Administered 2023-02-17: 80 mg via INTRAVENOUS

## 2023-02-17 MED ORDER — LIDOCAINE 2% (20 MG/ML) 5 ML SYRINGE
INTRAMUSCULAR | Status: DC | PRN
  Administered 2023-02-17: 100 mg via INTRAVENOUS

## 2023-02-17 MED ORDER — METOPROLOL SUCCINATE ER 50 MG PO TB24
50.0000 mg | ORAL_TABLET | Freq: Every day | ORAL | Status: DC
  Administered 2023-02-18: 50 mg via ORAL
  Filled 2023-02-17: qty 1

## 2023-02-17 MED ORDER — IOHEXOL 300 MG/ML  SOLN
INTRAMUSCULAR | Status: DC | PRN
  Administered 2023-02-17: 20 mL via URETHRAL

## 2023-02-17 MED ORDER — DEXAMETHASONE SODIUM PHOSPHATE 10 MG/ML IJ SOLN
INTRAMUSCULAR | Status: DC | PRN
  Administered 2023-02-17: 5 mg via INTRAVENOUS

## 2023-02-17 MED ORDER — LIDOCAINE-EPINEPHRINE 1 %-1:100000 IJ SOLN
20.0000 mL | Freq: Once | INTRAMUSCULAR | Status: AC
  Administered 2023-02-17: 10 mL via INTRADERMAL

## 2023-02-17 MED ORDER — FINASTERIDE 5 MG PO TABS
5.0000 mg | ORAL_TABLET | Freq: Every day | ORAL | Status: DC
  Administered 2023-02-17 – 2023-02-18 (×2): 5 mg via ORAL
  Filled 2023-02-17 (×2): qty 1

## 2023-02-17 MED ORDER — ACETAMINOPHEN 325 MG PO TABS: 650.0000 mg | ORAL_TABLET | ORAL | Status: DC | PRN

## 2023-02-17 MED ORDER — ALBUMIN HUMAN 5 % IV SOLN
INTRAVENOUS | Status: AC
  Filled 2023-02-17: qty 250

## 2023-02-17 MED ORDER — DIPHENHYDRAMINE HCL 50 MG/ML IJ SOLN: 12.5000 mg | Freq: Four times a day (QID) | INTRAMUSCULAR | Status: DC | PRN

## 2023-02-17 MED ORDER — LACTATED RINGERS IV SOLN: INTRAVENOUS | Status: DC | PRN

## 2023-02-17 MED ORDER — OXYCODONE HCL 5 MG PO TABS: 5.0000 mg | ORAL_TABLET | Freq: Once | ORAL | Status: DC | PRN

## 2023-02-17 MED ORDER — FENTANYL CITRATE (PF) 100 MCG/2ML IJ SOLN
25.0000 ug | INTRAMUSCULAR | Status: DC | PRN
  Administered 2023-02-17: 50 ug via INTRAVENOUS
  Administered 2023-02-17 (×2): 25 ug via INTRAVENOUS

## 2023-02-17 MED ORDER — FLUTICASONE FUROATE-VILANTEROL 200-25 MCG/ACT IN AEPB
1.0000 | INHALATION_SPRAY | Freq: Every day | RESPIRATORY_TRACT | Status: DC
  Administered 2023-02-18: 1 via RESPIRATORY_TRACT
  Filled 2023-02-17: qty 28

## 2023-02-17 MED ORDER — OXYCODONE HCL 5 MG PO TABS
5.0000 mg | ORAL_TABLET | ORAL | Status: DC | PRN
  Administered 2023-02-18: 5 mg via ORAL
  Filled 2023-02-17: qty 1

## 2023-02-17 MED ORDER — FENTANYL CITRATE (PF) 100 MCG/2ML IJ SOLN
INTRAMUSCULAR | Status: AC
  Filled 2023-02-17: qty 2

## 2023-02-17 MED ORDER — DIPHENHYDRAMINE HCL 12.5 MG/5ML PO ELIX: 12.5000 mg | ORAL_SOLUTION | Freq: Four times a day (QID) | ORAL | Status: DC | PRN

## 2023-02-17 MED ORDER — CO Q 10 100 MG PO CAPS: 100.0000 mg | ORAL_CAPSULE | Freq: Every day | ORAL | Status: DC

## 2023-02-17 MED ORDER — FENTANYL CITRATE (PF) 100 MCG/2ML IJ SOLN
INTRAMUSCULAR | Status: AC | PRN
Start: 2023-02-17 — End: 2023-02-17
  Administered 2023-02-17 (×2): 50 ug via INTRAVENOUS

## 2023-02-17 MED ORDER — MIDAZOLAM HCL 2 MG/2ML IJ SOLN
INTRAMUSCULAR | Status: AC
  Filled 2023-02-17: qty 4

## 2023-02-17 MED ORDER — SODIUM CHLORIDE 0.9 % IV SOLN
1.0000 g | INTRAVENOUS | Status: AC
  Administered 2023-02-17: 1 g via INTRAVENOUS

## 2023-02-17 MED ORDER — ONDANSETRON HCL 4 MG/2ML IJ SOLN
INTRAMUSCULAR | Status: DC | PRN
  Administered 2023-02-17: 4 mg via INTRAVENOUS

## 2023-02-17 MED ORDER — LACTATED RINGERS IV SOLN: INTRAVENOUS | Status: DC

## 2023-02-17 MED ORDER — GENTAMICIN SULFATE 40 MG/ML IJ SOLN: 160.0000 mg | INTRAVENOUS | Status: DC

## 2023-02-17 MED ORDER — METHOCARBAMOL 500 MG PO TABS
750.0000 mg | ORAL_TABLET | Freq: Three times a day (TID) | ORAL | Status: DC
  Administered 2023-02-17 – 2023-02-18 (×3): 750 mg via ORAL
  Filled 2023-02-17 (×3): qty 2

## 2023-02-17 MED ORDER — TAMSULOSIN HCL 0.4 MG PO CAPS
0.4000 mg | ORAL_CAPSULE | Freq: Every day | ORAL | Status: DC
  Administered 2023-02-17 – 2023-02-18 (×2): 0.4 mg via ORAL
  Filled 2023-02-17 (×2): qty 1

## 2023-02-17 MED ORDER — SENNA 8.6 MG PO TABS
1.0000 | ORAL_TABLET | Freq: Two times a day (BID) | ORAL | Status: DC
  Administered 2023-02-17 – 2023-02-18 (×2): 8.6 mg via ORAL
  Filled 2023-02-17 (×2): qty 1

## 2023-02-17 MED ORDER — PROPOFOL 10 MG/ML IV BOLUS
INTRAVENOUS | Status: DC | PRN
  Administered 2023-02-17: 20 mg via INTRAVENOUS
  Administered 2023-02-17: 100 mg via INTRAVENOUS

## 2023-02-17 MED ORDER — DEXTROSE-SODIUM CHLORIDE 5-0.45 % IV SOLN: INTRAVENOUS | Status: DC

## 2023-02-17 MED ORDER — ONDANSETRON HCL 4 MG/2ML IJ SOLN: 4.0000 mg | INTRAMUSCULAR | Status: DC | PRN

## 2023-02-17 MED ORDER — PHENYLEPHRINE 80 MCG/ML (10ML) SYRINGE FOR IV PUSH (FOR BLOOD PRESSURE SUPPORT)
PREFILLED_SYRINGE | INTRAVENOUS | Status: DC | PRN
  Administered 2023-02-17 (×6): 80 ug via INTRAVENOUS

## 2023-02-17 MED ORDER — AMPICILLIN SODIUM 1 G IJ SOLR
INTRAMUSCULAR | Status: AC
  Filled 2023-02-17: qty 1000

## 2023-02-17 MED ORDER — GENTAMICIN SULFATE 40 MG/ML IJ SOLN
440.0000 mg | Freq: Once | INTRAVENOUS | Status: AC
  Administered 2023-02-17: 440 mg via INTRAVENOUS
  Filled 2023-02-17: qty 11

## 2023-02-17 MED ORDER — NITROGLYCERIN 0.4 MG SL SUBL: 0.4000 mg | SUBLINGUAL_TABLET | SUBLINGUAL | Status: DC | PRN

## 2023-02-17 MED ORDER — SODIUM CHLORIDE 0.9 % IV SOLN
INTRAVENOUS | Status: AC
  Filled 2023-02-17: qty 100

## 2023-02-17 MED ORDER — IOHEXOL 300 MG/ML  SOLN
50.0000 mL | Freq: Once | INTRAMUSCULAR | Status: AC | PRN
  Administered 2023-02-17: 10 mL

## 2023-02-17 MED ORDER — ROSUVASTATIN CALCIUM 20 MG PO TABS
40.0000 mg | ORAL_TABLET | Freq: Every day | ORAL | Status: DC
  Administered 2023-02-17 – 2023-02-18 (×2): 40 mg via ORAL
  Filled 2023-02-17 (×2): qty 2

## 2023-02-17 MED ORDER — STERILE WATER FOR IRRIGATION IR SOLN
Status: DC | PRN
Start: 2023-02-17 — End: 2023-02-17
  Administered 2023-02-17: 500 mL

## 2023-02-17 MED ORDER — PROPOFOL 10 MG/ML IV BOLUS
INTRAVENOUS | Status: AC
  Filled 2023-02-17: qty 20

## 2023-02-17 MED ORDER — ONDANSETRON HCL 4 MG/2ML IJ SOLN: 4.0000 mg | Freq: Once | INTRAMUSCULAR | Status: DC | PRN

## 2023-02-17 MED ORDER — EZETIMIBE 10 MG PO TABS
10.0000 mg | ORAL_TABLET | Freq: Every day | ORAL | Status: DC
  Administered 2023-02-18: 10 mg via ORAL
  Filled 2023-02-17: qty 1

## 2023-02-17 MED ORDER — VITAMIN D 25 MCG (1000 UNIT) PO TABS
5000.0000 [IU] | ORAL_TABLET | Freq: Every day | ORAL | Status: DC
  Administered 2023-02-17 – 2023-02-18 (×2): 5000 [IU] via ORAL
  Filled 2023-02-17 (×2): qty 5

## 2023-02-17 MED ORDER — MIDAZOLAM HCL 2 MG/2ML IJ SOLN
INTRAMUSCULAR | Status: AC | PRN
Start: 2023-02-17 — End: 2023-02-17
  Administered 2023-02-17 (×2): 1 mg via INTRAVENOUS

## 2023-02-17 SURGICAL SUPPLY — 36 items
APL SKNCLS STERI-STRIP NONHPOA (GAUZE/BANDAGES/DRESSINGS)
BAG DRAIN URO-CYSTO SKYTR STRL (DRAIN) ×2 IMPLANT
BAG DRN RND TRDRP ANRFLXCHMBR (UROLOGICAL SUPPLIES) ×2
BAG DRN UROCATH (DRAIN) ×2
BAG URINE DRAIN 2000ML AR STRL (UROLOGICAL SUPPLIES) IMPLANT
BASKET STONE 1.7 NGAGE (UROLOGICAL SUPPLIES) IMPLANT
BASKET ZERO TIP NITINOL 2.4FR (BASKET) ×2 IMPLANT
BENZOIN TINCTURE PRP APPL 2/3 (GAUZE/BANDAGES/DRESSINGS) IMPLANT
BSKT STON RTRVL ZERO TP 2.4FR (BASKET)
CATH FOLEY 2WAY SLVR 5CC 20FR (CATHETERS) IMPLANT
CATH URETERAL DUAL LUMEN 10F (MISCELLANEOUS) IMPLANT
CATH URETL OPEN 5X70 (CATHETERS) IMPLANT
CATH URETL OPEN END 6FR 70 (CATHETERS) IMPLANT
CLOTH BEACON ORANGE TIMEOUT ST (SAFETY) ×2 IMPLANT
GLOVE BIO SURGEON STRL SZ8 (GLOVE) IMPLANT
GOWN STRL REUS W/TWL XL LVL3 (GOWN DISPOSABLE) ×2 IMPLANT
GUIDEWIRE ANG ZIPWIRE 038X150 (WIRE) IMPLANT
GUIDEWIRE STR DUAL SENSOR (WIRE) IMPLANT
GUIDEWIRE ZIPWRE .038 STRAIGHT (WIRE) IMPLANT
HOLDER FOLEY CATH W/STRAP (MISCELLANEOUS) IMPLANT
IV NS IRRIG 3000ML ARTHROMATIC (IV SOLUTION) ×4 IMPLANT
KIT TURNOVER CYSTO (KITS) ×2 IMPLANT
LASER FIB FLEXIVA PULSE ID 365 (Laser) IMPLANT
LASER FIB FLEXIVA PULSE ID 910 (Laser) IMPLANT
MANIFOLD NEPTUNE II (INSTRUMENTS) ×2 IMPLANT
NS IRRIG 500ML POUR BTL (IV SOLUTION) ×2 IMPLANT
PACK CYSTO (CUSTOM PROCEDURE TRAY) ×2 IMPLANT
SLEEVE SCD COMPRESS KNEE MED (STOCKING) ×2 IMPLANT
STRIP CLOSURE SKIN 1/2X4 (GAUZE/BANDAGES/DRESSINGS) IMPLANT
SYR 10ML LL (SYRINGE) IMPLANT
SYR TOOMEY IRRIG 70ML (MISCELLANEOUS) ×2
SYRINGE TOOMEY IRRIG 70ML (MISCELLANEOUS) IMPLANT
TRACTIP FLEXIVA PULS ID 200XHI (Laser) IMPLANT
TRACTIP FLEXIVA PULSE ID 200 (Laser)
TUBE CONNECTING 12X1/4 (SUCTIONS) IMPLANT
TUBING UROLOGY SET (TUBING) ×2 IMPLANT

## 2023-02-17 NOTE — Transfer of Care (Signed)
Immediate Anesthesia Transfer of Care Note  Patient: Alex [name]  Procedure(s) Performed: CYSTOSCOPY WITH LITHOLAPAXY (Bladder) CYSTOSCOPY LEFT URETEROSCOPY, ATTEMPTED (Left: Renal)  Patient Location: PACU  Anesthesia Type:General  Level of Consciousness: drowsy and patient cooperative  Airway & Oxygen Therapy: Patient Spontanous Breathing and Patient connected to nasal cannula oxygen  Post-op Assessment: Report given to RN and Post -op Vital signs reviewed and stable  Post vital signs: Reviewed and stable  Last Vitals:  Vitals Value Taken Time  BP 155/83 02/17/23 1151  Temp    Pulse 73 02/17/23 1154  Resp 15 02/17/23 1154  SpO2 100 % 02/17/23 1154  Vitals shown include unfiled device data.  Last Pain:  Vitals:   02/17/23 0850  TempSrc: Oral  PainSc: 0-No pain      Patients Stated Pain Goal: 2 (02/17/23 0850)  Complications: No notable events documented.

## 2023-02-17 NOTE — H&P (Signed)
CC/HPI: cc: Voiding symptoms   HPI: [name] is an 89 year old man who has been treated by his PCP for the past 2 months due to bothersome luts. He endorses frequency of urination every 1-2 hours, some urinary hesitancy, burning with urination, and feeling like he is not emptying his bladder completely. He is on tamsulosin 0.4 mg. Over the last few months he has been incontinent during the night. He had 1 positive UTI back in July and was treated. The organism was Klebsiella. He has had a past medical history of stones. His PVR today is over 600 mL. He denies flank pain and gross hematuria.   He is here today for cystolithopaxy and L ureteroscopy with laser lithotripsy     ALLERGIES: No Allergies    MEDICATIONS: Metoprolol Succinate 50 mg tablet, extended release 24 hr  Advair Diskus 250 mcg-50 mcg/dose blister, with inhalation device Inhalation  Aspirin Ec 81 mg tablet, delayed release Oral  B-Complex 400 mcg tablet Oral  Cinnamon 500 MG Oral Tablet Oral  Citrus Bergamot 100 % powder Oral  Clopidogrel 75 mg tablet  Co Q10 60 mg capsule Oral  DiazePAM 5 MG Oral Tablet Oral  Doxepin Hcl 25 mg capsule Oral  Fish Oil CAPS Oral  Ginger 500 mg capsule Oral  Ibuprofen 200 mg tablet Oral  Lipitor 10 mg tablet Oral  Lorazepam 1 mg tablet Oral  Omega 3 684 mg-1,200 mg capsule,delayed release Oral  Potassium TABS Oral  Ramipril 5 mg capsule Oral  Resveratrol 100 MG Oral Capsule Oral  Rosuvastatin Calcium 40 mg tablet  Tamsulosin HCl - 0.4 MG Oral Capsule 0 Oral  Vitamin D3 50 mcg (2,000 unit) tablet Oral     GU PSH: ESWL - 2005       PSH Notes: CABG (CABG), Bypass Graft (Non-Vein) Carotid, Kidney Surgery   NON-GU PSH: Artery Bypass Graft - 2016 CABG (coronary artery bypass grafting) - 2016 Carotid Endarterectomy, Left - 2016 Colonoscopy - 2007 Coronary Artery Bypass Grafting - 2000 Esophagus Endoscopy - 2007 Hemorrhoidectomy - 1981 Tonsillectomy - 1940     GU PMH: BPH w/o  LUTS, Benign prostatic hypertrophy without lower urinary tract symptoms - 2016 Urinary Retention, Unspec, Incomplete bladder emptying - 2016 Renal calculus    NON-GU PMH: Encounter for general adult medical examination without abnormal findings, Encounter for preventive health examination - 2016 Myocardial Infarction, History of myocardial infarction - 2016 Personal history of other diseases of the circulatory system, History of hypertension - 2016, History of atrial fibrillation, - 2016 Hypertension Sleep Apnea    FAMILY HISTORY: 2 daughters - Daughter 2 sons - Son ALS-parkinsonism/Dementia Complex 1 - Father Deceased - Father, Mother liver cancer - Mother No pertinent family history - Other No significant past medical history - Runs In Family Prostate Cancer - Brother   SOCIAL HISTORY: Marital Status: Widowed Preferred Language: English; Ethnicity: Not Hispanic Or Latino; Race: White Current Smoking Status: Patient does not smoke anymore. Has not smoked since 01/01/1974. Smoked for 24 years.   Tobacco Use Assessment Completed: Used Tobacco in last 30 days? Does not use smokeless tobacco. Drinks 2 drinks per week.  Patient's occupation is/was retired.     Notes: Former smoker, Daily caffeine consumption, 2-3 servings a day, Alcohol use, Retired, Widower, Four children   REVIEW OF SYSTEMS:    GU Review Male:   Patient reports frequent urination, hard to postpone urination, get up at night to urinate, leakage of urine, and erection problems. Patient denies burning/ pain with  urination, stream starts and stops, trouble starting your stream, have to strain to urinate , and penile pain.  Gastrointestinal (Upper):   Patient denies nausea, vomiting, and indigestion/ heartburn.  Gastrointestinal (Lower):   Patient denies diarrhea and constipation.  Constitutional:   Patient reports fatigue. Patient denies fever, night sweats, and weight loss.  Skin:   Patient denies skin rash/ lesion and  itching.  Eyes:   Patient denies blurred vision and double vision.  Ears/ Nose/ Throat:   Patient reports sinus problems. Patient denies sore throat.  Hematologic/Lymphatic:   Patient reports swollen glands. Patient denies easy bruising.  Cardiovascular:   Patient reports leg swelling. Patient denies chest pains.  Respiratory:   Patient denies cough and shortness of breath.  Endocrine:   Patient denies excessive thirst.  Musculoskeletal:   Patient reports joint pain. Patient denies back pain.  Neurological:   Patient denies headaches and dizziness.  Psychologic:   Patient denies depression and anxiety.   Notes: no ua, urinary tract infection     VITAL SIGNS:      01/24/2023 01:37 PM  Weight 210 lb / 95.25 kg  Height 70 in / 177.8 cm  BP 133/70 mmHg  Pulse 84 /min  Temperature 98.4 F / 36.8 C  BMI 30.1 kg/m   GU PHYSICAL EXAMINATION:    Anus and Perineum: No hemorrhoids. No anal stenosis. No rectal fissure, no anal fissure. No edema, no dimple, no perineal tenderness, no anal tenderness.  Scrotum: No lesions. No edema. No cysts. No warts.  Epididymides: Right: no spermatocele, no masses, no cysts, no tenderness, no induration, no enlargement. Left: no spermatocele, no masses, no cysts, no tenderness, no induration, no enlargement.  Testes: No tenderness, no swelling, no enlargement left testes. No tenderness, no swelling, no enlargement right testes. Normal location left testes. Normal location right testes. No mass, no cyst, no varicocele, no hydrocele left testes. No mass, no cyst, no varicocele, no hydrocele right testes.  Urethral Meatus: Normal size. No lesion, no wart, no discharge, no polyp. Normal location.  Penis: Circumcised, no warts, no cracks. No dorsal Peyronie's plaques, no left corporal Peyronie's plaques, no right corporal Peyronie's plaques, no scarring, no warts. No balanitis, no meatal stenosis.  Prostate: 40 gram or 2+ size. Left lobe normal consistency, right lobe  normal consistency. Symmetrical lobes. No prostate nodule. Left lobe no tenderness, right lobe no tenderness.  Seminal Vesicles: Nonpalpable.  Sphincter Tone: Normal sphincter. No rectal tenderness. No rectal mass.    MULTI-SYSTEM PHYSICAL EXAMINATION:    Constitutional: Well-nourished. No physical deformities. Normally developed. Good grooming.  Neck: Neck symmetrical, not swollen. Normal tracheal position.  Respiratory: No labored breathing, no use of accessory muscles.   Cardiovascular: Normal temperature, normal extremity pulses, no swelling, no varicosities.  Lymphatic: No enlargement of neck, axillae, groin.  Skin: No paleness, no jaundice, no cyanosis. No lesion, no ulcer, no rash.  Neurologic / Psychiatric: Oriented to time, oriented to place, oriented to person. No depression, no anxiety, no agitation.  Gastrointestinal: No mass, no tenderness, no rigidity, non obese abdomen.  Eyes: Normal conjunctivae. Normal eyelids.  Ears, Nose, Mouth, and Throat: Left ear no scars, no lesions, no masses. Right ear no scars, no lesions, no masses. Nose no scars, no lesions, no masses. Normal hearing. Normal lips.  Musculoskeletal: Normal gait and station of head and neck.     Complexity of Data:  Source Of History:  Patient  Records Review:   Previous Doctor Records, Previous Patient Records  Urodynamics Review:   Review Bladder Scan  X-Ray Review: Renal Ultrasound: Reviewed Films. Reviewed Report. Discussed With Patient.     12/20/03  PSA  Total PSA 2.26     PROCEDURES:         C.T. Urogram - [id number]      Patient confirmed No Neulasta OnPro Device.          Renal Ultrasound - [id number]  Right Kidney: Length: 10.8 cm Depth: 6.5 cm Cortical Width: 1.7 cm Width: 5.9 cm  Left Kidney: Length: 11.3 cm Depth: 5.9 cm Cortical Width: 1.8 cm Width: 6.5 cm  Left Kidney/Ureter:  1) cyst in the upper pole measures 2.2cm 2)distal calc measures= .84cm 3) hydro noted   Right Kidney/Ureter:  cyst  in the upper pole measures 1.1cm and a calc mid pole measures .75cm  Bladder:  PVR= 1009.28ml stones in bladder measure 3.3cm and 1.6cm      . Patient confirmed No Neulasta OnPro Device.          PVR Ultrasound - 36644  Scanned Volume: 682 cc         Urinalysis w/Scope Dipstick Dipstick Cont'd Micro  Color: Amber Bilirubin: Neg mg/dL WBC/hpf: 20 - 03/KVQ  Appearance: Slightly Cloudy Ketones: Neg mg/dL RBC/hpf: 3 - 25/ZDG  Specific Gravity: 1.025 Blood: Trace ery/uL Bacteria: Mod (26-50/hpf)  pH: 5.5 Protein: Trace mg/dL Cystals: NS (Not Seen)  Glucose: Neg mg/dL Urobilinogen: 0.2 mg/dL Casts: Hyaline    Nitrites: Neg Trichomonas: Not Present    Leukocyte Esterase: 1+ leu/uL Mucous: Present      Epithelial Cells: 0 - 5/hpf      Yeast: NS (Not Seen)      Sperm: Not Present    ASSESSMENT:      ICD-10 Details  1 GU:   BPH w/o LUTS - N40.0 Chronic, Exacerbation  2   Urinary Frequency - R35.0 Chronic, Exacerbation  3   Incomplete bladder emptying - R39.14 Chronic, Exacerbation  4   Urinary Urgency - R39.15 Chronic, Exacerbation  5   Hydronephrosis - N13.0 Left, Acute, Threat to Bodily Function  6   Renal and ureteral calculus - N20.2 Chronic, Threat to Bodily Function   PLAN:            Medications New Meds: Proscar 5 mg tablet 1 tablet PO Daily   #30  6 Refill(s)  Tamsulosin Hcl 0.4 mg capsule 1 capsule PO Q HS   #90  3 Refill(s)  Pharmacy Name:  [facility] [id number]  Address:  [address]   [place], [place] [id number]  Phone:  [phone]  Fax:  [phone]            Orders Labs BMP, CULTURE, URINE  X-Rays: Renal Ultrasound    C.T. Stone Protocol Without I.V. Contrast  X-Ray Notes: History:  Hematuria: Yes/No  Patient to see MD after exam: Yes/No  Previous exam: CT / IVP/ US/ KUB/ None  When:  Where:  Diabetic: Yes/ No  BUN/ Creatinine:  Date of last BUN Creatinine:  Weight in pounds:  Allergy- IV Contrast: Yes/ No  Conflicting  diabetic meds: Yes/ No  Diabetic Meds:  Prior Authorization #Christena Flake #[id number] Valid 01/24/23 thru 03/25/23            Schedule         Document Letter(s):  Created for Patient: Clinical Summary         Notes:     54 Male with bladder stones and L  ureteral stone plan for L URS w/ LL and cystolithopaxy.    We discussed risk benefits alternatives to ureteroscopy with cystolithopaxy.  This included bleeding infection and damage to surrounding structures surrounding structures including ureter as well as urethra.  We discussed the need for stent postoperatively as well as the potential symptoms of stent placement.  We discussed possible inability to complete procedure due to caliber of your ureter or inability to pass stone possibly requiring long-term stent versus nephrostomy tube.  We discussed need for possible second surgery.  We also discussed the need for possible catheter post op. Patient voiced their understanding and consent was obtained.

## 2023-02-17 NOTE — Progress Notes (Signed)
   Unable to get access to L ureter patient requires nephrostomy tube.  Discussion with the family was held and the decision was made to the best option would be left nephrostomy tube.  We discussed the risks of thinking bleeding with nephrostomy tube and possible need to stay overnight.  This was discussed with the son as well as daughter.  This was witnessed by Johney Maine RN.

## 2023-02-17 NOTE — Progress Notes (Signed)
Report given at [id number]

## 2023-02-17 NOTE — Op Note (Addendum)
Preoperative diagnosis: left ureteral calculus and bladder stone    Postoperative diagnosis: left ureteral calculus and bladder stone    Procedure:   Cystolithopaxy for stone size > 2.5cm  Left Retrograde pyelogram      Surgeon: Dr. Vilma Prader   Anesthesia: General   Complications: None   Intraoperative findings:  Large prostate bladder neck, moderate obstruction  Bladder stones fragmented and removed. Left retrograde pyelogram demonstrating J hooking ureter. Multiple attempts to cannulate left ureter unable to make it pass J-hook.     EBL: Minimal   Specimens: None   Disposition of specimens: [facility] for stone analysis   Indication: Alex [name] is a  89 y.o.   patient with a left ureteral stone and bladder stone with associated symptoms.  After reviewing the management options for treatment, the patient elected to proceed with the above surgical procedure(s). We have discussed the potential benefits and risks of the procedure, side effects of the proposed treatment, the likelihood of the patient achieving the goals of the procedure, and any potential problems that might occur during the procedure or recuperation. Informed consent has been obtained.     Description of procedure:   The patient was taken to the operating room and general anesthesia was induced.  The patient was placed in the dorsal lithotomy position, prepped and draped in the usual sterile fashion, and preoperative antibiotics were administered. A preoperative time-out was performed.    Cystourethroscopy was performed.  The patient's urethra was examined and was normal.  No strictures were noted patient was noted to have a very large prostate and high bladder neck.   Attention was turned to the bladder stone where total stone burden was greater than 2.5cm where 1000 m laser was then used to dust the stone and fragmented until the pieces could be evacuated.  Settings for the stone  fragmentation were 1 J at 40 Hz.   Attention was then turned to the left ureter where a wire was attempted to place and would not be placed then a 6 French Pollick catheter was attempted to cannulate the ureter.  The 6 French Pollick catheter advanced just inside the ureteral orifice a sensor wire was then attempted to advance could not advance.  A retrograde pyelogram was then done demonstrating a very curling J-hook distal ureter.  After this was attempted a straight Glidewire and a angled Glidewire were attempted to get past the J-hook this was unsuccessful.   The laser bridge was then exchanged for the semirigid ureteroscope.  And attempt to visualize the ureteral orifice was attempted and attempted cannulated with a sensor wire as well as angled Glidewire and straight Glidewire none were successful in making the bend of the J hooked ureter. A semi rigid was attempted to see if visualization of the lumen could get a wire to pass but this was unsuccessful as well. After a long time attempting to cannulate ureter with wire decision was made to stop the procedure and have and have nephrostomy tube placed.   The cystoscope was then removed and a 20 French catheter was placed in the bladder. 15cc of sterile water were instilled into the balloon.  The bladder was irrigated several times until the urine was light pink.   Disposition: Patient was then taken to the PACU in stable condition with a 20 French catheter.   Discussion with the family was held and the decision was made to the best option would be left nephrostomy tube.  We discussed  the risks of thinking bleeding with nephrostomy tube and possible need to stay overnight.  This was discussed with the son as well as daughter.  This was witnessed by Johney Maine RN.

## 2023-02-17 NOTE — Progress Notes (Signed)
Dr. Jennette Bill at bedside to assess patient. Doctor states he will assess patient in IR after his procedure to determine next phase of care.

## 2023-02-17 NOTE — Anesthesia Procedure Notes (Signed)
Procedure Name: Intubation Date/Time: 02/17/2023 9:42 AM  Performed by: Dairl Ponder, CRNAPre-anesthesia Checklist: Patient identified, Emergency Drugs available, Suction available and Patient being monitored Patient Re-evaluated:Patient Re-evaluated prior to induction Oxygen Delivery Method: Circle System Utilized Preoxygenation: Pre-oxygenation with 100% oxygen Induction Type: IV induction Ventilation: Mask ventilation without difficulty Laryngoscope Size: Mac and 4 Grade View: Grade II Tube type: Oral Tube size: 7.5 mm Number of attempts: 1 Airway Equipment and Method: Stylet and Oral airway Placement Confirmation: ETT inserted through vocal cords under direct vision, positive ETCO2 and breath sounds checked- equal and bilateral Secured at: 23 cm Tube secured with: Tape Dental Injury: Teeth and Oropharynx as per pre-operative assessment

## 2023-02-17 NOTE — Anesthesia Preprocedure Evaluation (Signed)
Anesthesia Evaluation  Patient identified by MRN, date of birth, ID band Patient awake    Reviewed: Allergy & Precautions, H&P , NPO status , Patient's Chart, lab work & pertinent test results  Airway Mallampati: II  TM Distance: >3 FB Neck ROM: Full    Dental no notable dental hx.    Pulmonary COPD, former smoker   Pulmonary exam normal breath sounds clear to auscultation       Cardiovascular hypertension, + CAD, + Past MI, + Cardiac Stents, + CABG and +CHF  Normal cardiovascular exam Rhythm:Regular Rate:Normal     Neuro/Psych negative neurological ROS  negative psych ROS   GI/Hepatic negative GI ROS, Neg liver ROS,,,  Endo/Other  negative endocrine ROS    Renal/GU negative Renal ROS  negative genitourinary   Musculoskeletal negative musculoskeletal ROS (+)    Abdominal   Peds negative pediatric ROS (+)  Hematology negative hematology ROS (+)   Anesthesia Other Findings   Reproductive/Obstetrics negative OB ROS                             Anesthesia Physical Anesthesia Plan  ASA: 3  Anesthesia Plan: General   Post-op Pain Management: Minimal or no pain anticipated   Induction: Intravenous  PONV Risk Score and Plan: 2 and Ondansetron, Dexamethasone and Treatment may vary due to age or medical condition  Airway Management Planned: LMA  Additional Equipment:   Intra-op Plan:   Post-operative Plan: Extubation in OR  Informed Consent: I have reviewed the patients History and Physical, chart, labs and discussed the procedure including the risks, benefits and alternatives for the proposed anesthesia with the patient or authorized representative who has indicated his/her understanding and acceptance.     Dental advisory given  Plan Discussed with: CRNA and Surgeon  Anesthesia Plan Comments:        Anesthesia Quick Evaluation

## 2023-02-17 NOTE — Consult Note (Signed)
.     Chief Complaint: Patient was seen in consultation today for ureteral stones  Referring Physician(s): Showalter,Victor C  Supervising Physician: Marliss Coots  Patient Status: [facility] - In-pt  History of Present Illness: Alex [name] is a 89 y.o. male with relevant past medical history of emphysema, MI s/p PCI with stent placement, kidney stones, bladder stones, BPH, with several month history of poorly controlled LUTS.  He underwent elective outpatient cystogram, cystolithopaxy, L ureteroescopy with lithotripsy with Urology today however with only limited success now in need of percutaneous nephrostomy tube placement.    Patient assessed in PACU.  He is groggy, non-focused, in pain from Foley catheter.  There is dark, blood-tinged urine in Foley.  He is NPO post-procedure.   Past Medical History:  Diagnosis Date   Acquired deformity of nose    Arthritis    Benign localized prostatic hyperplasia with lower urinary tract symptoms (LUTS)    Bilateral lower extremity edema    chronic   Bladder calculus    Cervicalgia    Chronic heart failure with preserved ejection fraction (HFpEF) (HCC)    followed by cardiology---- per last echo in epic   20-20-2021  EF ~45-50%   Complication of anesthesia    history post op urinary retension 02/ 2016 post op Left CEA   Emphysema lung (HCC)    per pt has advair prn   Essential hypertension    History of esophagitis    History of kidney stones    History of nonmelanoma skin cancer    History of ST elevation myocardial infarction (STEMI) 06/22/2019   True posterior STEMI -> occluded SVG.  Native LM--LCX PCI w/ DES   Hyperlipidemia    Insomnia, unspecified    Internal hemorrhoids without mention of complication    Ischemic cardiomyopathy    last echo in epic 06-23-2019  ef 45-50%   Left carotid artery stenosis    S/P L CEA - 2015 (Dr. Myra Gianotti)   Left ureteral calculus    Multiple vessel coronary artery disease 2000   cardiologist--- dr  Herbie Baltimore ;   cath 2000 ~50% LM-ostLAD with 85% ost-mid LCx, 80% ost RI, 100% CTO RCA -> CABG X 4 (LIMA-LAD, RIMA-dRCA, SVG-OM2, SVG-RI) (Dr. Dorris Fetch) => 06/2019:  posterior STEMI : 100% SVG-OM2 (unable to open), 100% OM2; DES PCI LM-mLCx.   Pre-diabetes    S/P drug eluting coronary stent placement 06/22/2019   PCI and DES x1  to LM -- mLCx   Wears glasses     Past Surgical History:  Procedure Laterality Date   COLONOSCOPY WITH ESOPHAGOGASTRODUODENOSCOPY (EGD)  2007   Dr Virginia Rochester   CORONARY ARTERY BYPASS GRAFT  09/11/1998   @ [facility] by Dr. Kathie Rhodes.  [place] - CABG x 4:  LIMA-LAD, RIMA-dRCA, SVG-OM2, SVG-RI   CORONARY STENT INTERVENTION N/A 06/22/2019   Procedure: CORONARY STENT INTERVENTION;  Surgeon: Marykay Lex, MD;  Location: [facility] INVASIVE CV LAB; unsuccessful attempt at revascularization of SVG-OM2 => Synergy DES PCI from LM-mid LCx (Synergy DES 2.5 mm x 38 mm--> 3.1-2.6 mm); unfortunately OM2 was occluded.  This revascularized OM1, OM 3 and OM 4.   CORONARY/GRAFT ACUTE MI REVASCULARIZATION N/A 06/22/2019   Procedure: CORONARY/GRAFT ACUTE MI REVASCULARIZATION;  Surgeon: Marykay Lex, MD;  Location: [facility] INVASIVE CV LAB;; unsuccessful PTCA/aspiration thrombectomy of SVG-OM1 as culprit lesion for STEMI   ENDARTERECTOMY Left 06/14/2014   Procedure: LEFT CAROTID ENDARTERECTOMY WITH VASCUGUARD PATCH ANGIOPLASTY;  Surgeon: Nada Libman, MD;  Location: [facility] OR;  Service: Vascular;  Laterality: Left;   EXCISIONAL HEMORRHOIDECTOMY  1981   EXTRACORPOREAL SHOCK WAVE LITHOTRIPSY  2005   INCISION AND DRAINAGE Right 06/06/2016   @[facility]    right mandible area for abscess   LEFT HEART CATH AND CORONARY ANGIOGRAPHY  2000   MV CAD --> CABG x 4   LEFT HEART CATH AND CORS/GRAFTS ANGIOGRAPHY N/A 06/22/2019   Procedure: LEFT HEART CATH AND CORS/GRAFTS ANGIOGRAPHY;  Surgeon: Marykay Lex, MD;  CULPRIT LESION = 100% SVG-OM2; Severe native CAD: mLM-Ost LAD-50% w/ Ost LCx 85% (DES PCI OM-OstLCx)- Treated b/c  100% TO SVG-OM2 as Culprit lesion - unable to open. OM2 100%. Ost RI 80%, Ost-mid LAD 50% - 100% mLAD after D1; ost RI 80%, mid RCA 100%; Patent SVG-RI, pedLIMA-dLAD, pedRIMA-dRCA; EF 45-50%   MULTIPLE TOOTH EXTRACTIONS  06/01/2016   @[facility]     due to abscesses   TONSILLECTOMY  1940   TRANSTHORACIC ECHOCARDIOGRAM  06/23/2019   Inferolateral STEMI (occluded SVG-OM2): (Definity) mildly reduced LV function-EF 45 to 50%.  GR 1 DD.  No obvious R WMA.  Essentially normal valves.  Mild MR.    Allergies: Patient has no known allergies.  Medications: Prior to Admission medications   Medication Sig Start Date End Date Taking? Authorizing Provider  ascorbic acid (VITAMIN C) 500 MG tablet Take 500-1,000 mg by mouth daily.   Yes [provider]  b complex vitamins tablet Take 1 tablet by mouth daily.   Yes [provider]  Cholecalciferol (VITAMIN D3) 125 MCG (5000 UT) CAPS Take 5,000 Units by mouth daily.   Yes [provider]  clopidogrel (PLAVIX) 75 MG tablet TAKE 1 TABLET(75 MG) BY MOUTH DAILY Patient taking differently: Take 75 mg by mouth daily. 04/27/22  Yes Marykay Lex, MD  diazepam (VALIUM) 5 MG tablet TAKE 1/2 TABLET(2.5 MG) BY MOUTH TWICE DAILY Patient taking differently: Take 2.5 mg by mouth at bedtime. TAKE 1/2 TABLET(2.5 MG) BY MOUTH TWICE DAILY 12/13/22  Yes Frederica Kuster, MD  doxepin (SINEQUAN) 75 MG capsule Take 1 capsule (75 mg total) by mouth at bedtime. 10/20/22  Yes Frederica Kuster, MD  metoprolol succinate (TOPROL-XL) 50 MG 24 hr tablet Take one tablet by mouth once daily. Take with or immediately following a meal. Patient taking differently: Take 50 mg by mouth daily. Take one tablet by mouth once daily. Take with or immediately following a meal. 01/24/23  Yes Fargo, Amy E, NP  ramipril (ALTACE) 10 MG capsule TAKE 1 CAPSULE BY MOUTH EVERY DAY Patient taking differently: Take 10 mg by mouth daily. TAKE 1 CAPSULE BY MOUTH EVERY DAY 10/05/22  Yes  Frederica Kuster, MD  rosuvastatin (CRESTOR) 40 MG tablet Take 1 tablet (40 mg total) by mouth daily. Patient taking differently: Take 40 mg by mouth daily. 10/29/22  Yes Marykay Lex, MD  Alpha-Lipoic Acid 200 MG CAPS Take 200 mg by mouth.    [provider]  Boswellia Serrata (BOSWELLIA PO) Take 800 mg by mouth.    [provider]  Cinnamon 500 MG TABS Take 500 mg by mouth 3 (three) times a week.     [provider]  CITRUS BERGAMOT PO Take 1 capsule by mouth 2 (two) times a week.    [provider]  Coenzyme Q10 (CO Q 10) 100 MG CAPS Take 100 mg by mouth daily.     [provider]  Cyanocobalamin (VITAMIN B-12 PO) Take 1 tablet by mouth daily.    [provider]  ezetimibe (ZETIA) 10 MG tablet Take 1 tablet (10 mg total) by mouth daily. Patient not taking: Reported on 02/15/2023 07/23/20   Marykay Lex, MD  finasteride (PROSCAR) 5 MG tablet Take 5 mg by mouth daily.    [provider]  Fluticasone-Salmeterol (ADVAIR) 250-50 MCG/DOSE AEPB Inhale 1 puff into the lungs daily as needed.    [provider]  Grape Seed OIL Take 1 capsule by mouth daily.    [provider]  nitroGLYCERIN (NITROSTAT) 0.4 MG SL tablet Place 1 tablet (0.4 mg total) under the tongue every 5 (five) minutes as needed. Patient taking differently: Place 0.4 mg under the tongue every 5 (five) minutes as needed for chest pain. 06/25/19   Marykay Lex, MD  RESVERATROL 100 MG CAPS Take 100 mg by mouth daily.     [provider]  tamsulosin (FLOMAX) 0.4 MG CAPS capsule Take 1 capsule (0.4 mg total) by mouth daily. Patient not taking: Reported on 02/15/2023 08/04/22   Frederica Kuster, MD  TURMERIC PO Take 1 capsule by mouth as needed.     [provider]     Family History  Problem Relation Age of Onset   Cancer Mother        LIVER   Cancer Brother        PROSTATE   Cancer Daughter     Social History    Socioeconomic History   Marital status: Widowed    Spouse name: Not on file   Number of children: Not on file   Years of education: Not on file   Highest education level: Not on file  Occupational History   Not on file  Tobacco Use   Smoking status: Former    Current packs/day: 0.00    Average packs/day: 2.0 packs/day for 25.0 years (50.0 ttl pk-yrs)    Types: Cigarettes    Start date: 05/03/1948    Quit date: 05/03/1973    Years since quitting: 49.8    Passive exposure: Never   Smokeless tobacco: Never  Vaping Use   Vaping status: Never Used  Substance and Sexual Activity   Alcohol use: Yes    Alcohol/week: 1.0 - 2.0 standard drink of alcohol    Types: 1 - 2 Glasses of wine per week   Drug use: Never   Sexual activity: Never  Other Topics Concern   Not on file  Social History Narrative   Not on file   Social Determinants of Health   Financial Resource Strain: Low Risk  (09/09/2017)   Overall Financial Resource Strain (CARDIA)    Difficulty of Paying Living Expenses: Not hard at all  Food Insecurity: No Food Insecurity (09/09/2017)   Hunger Vital Sign    Worried About Running Out of Food in the Last Year: Never true    Ran Out of Food in the Last Year: Never true  Transportation Needs: No Transportation Needs (09/09/2017)   PRAPARE - Administrator, Civil Service (Medical): No    Lack of Transportation (Non-Medical): No  Physical Activity: Insufficiently Active (09/09/2017)   Exercise Vital Sign    Days of Exercise per Week: 7 days    Minutes of Exercise per Session: 20 min  Stress: Stress Concern Present (09/09/2017)   [employer] of Occupational Health - Occupational Stress Questionnaire    Feeling of Stress : To some extent  Social Connections: Moderately Isolated (09/09/2017)   Social Connection and Isolation Panel [NHANES]  Frequency of Communication with Friends and Family: More than three times a week    Frequency of Social Gatherings with  Friends and Family: More than three times a week    Attends Religious Services: Never    Database administrator or Organizations: No    Attends Banker Meetings: Never    Marital Status: Widowed     Review of Systems: A 12 point ROS discussed and pertinent positives are indicated in the HPI above.  All other systems are negative.  Review of Systems  Unable to perform ROS: Acuity of condition    Vital Signs: BP (!) 141/69 (BP Location: Right Arm)   Pulse 71   Temp 97.8 F (36.6 C)   Resp 13   Ht 5\' 10"  (1.778 m)   Wt 206 lb 1.6 oz (93.5 kg)   SpO2 100%   BMI 29.57 kg/m   Physical Exam Vitals and nursing note reviewed.  Constitutional:      General: He is not in acute distress.    Appearance: Normal appearance. He is not ill-appearing.  HENT:     Mouth/Throat:     Mouth: Mucous membranes are moist.     Pharynx: Oropharynx is clear.  Cardiovascular:     Rate and Rhythm: Normal rate and regular rhythm.  Pulmonary:     Effort: Pulmonary effort is normal.     Breath sounds: Normal breath sounds.     Comments: On nasal cannula post op Abdominal:     General: Abdomen is flat.     Palpations: Abdomen is soft.  Genitourinary:    Comments: Foley in place.  Amber, blood-tinged urine in Foley bag. Skin:    General: Skin is warm and dry.  Neurological:     General: No focal deficit present.     Mental Status: He is alert and oriented to person, place, and time. Mental status is at baseline.  Psychiatric:        Mood and Affect: Mood normal.        Behavior: Behavior normal.        Thought Content: Thought content normal.        Judgment: Judgment normal.      MD Evaluation Airway: WNL Heart: WNL Abdomen: WNL Chest/ Lungs: WNL ASA  Classification: 3 Mallampati/Airway Score: Two   Imaging: No results found.  Labs:  CBC: Recent Labs    11/23/22 1400 12/13/22 1442 02/17/23 0912  WBC 16.8* 10.4  --   HGB 12.8* 12.7* 12.2*  HCT 37.5* 38.2*  36.0*  PLT 233 266  --     COAGS: No results for input(s): "INR", "APTT" in the last 8760 hours.  BMP: Recent Labs    10/27/22 1401 11/23/22 1400 12/13/22 1442 02/17/23 0912  NA 142 138 137 143  K 3.9 4.1 4.3 3.7  CL 106 101 102 106  CO2 28 28 25   --   GLUCOSE 97 94 97 99  BUN 14 15 21 15   CALCIUM 9.3 9.1 8.9  --   CREATININE 0.98 0.97 1.21 1.20    LIVER FUNCTION TESTS: Recent Labs    10/27/22 1401 11/23/22 1400 12/13/22 1442  BILITOT 0.5 0.6 0.8  AST 17 15 15   ALT 14 11 11   PROT 6.5 6.9 6.7    TUMOR MARKERS: No results for input(s): "AFPTM", "CEA", "CA199", "CHROMGRNA" in the last 8760 hours.  Assessment and Plan: L ureteral stones, L hydronephrosis Patient s/p cystoscopy with intent for for left-sided lithotripsy,  however now in need of L PCN placement.  Patient has been admitted to Urology service for observation following PCN placement.  Discussed with patient's son, Alex [name], over the phone as patient is currently sedated, groggy. Waking up slowly.  Foley catheter currently in place.   Alex [name] is aware of the goals of the procedure and provides consent on his father's behalf.   Risks and benefits of left PCN placement was discussed with the patient including, but not limited to, infection, bleeding, significant bleeding causing loss or decrease in renal function or damage to adjacent structures.   All of the patient's son's questions were answered, heis agreeable to proceed.  Consent signed and in chart.   Thank you for this interesting consult.  I greatly enjoyed meeting Alex [name] and look forward to participating in their care.  A copy of this report was sent to the requesting provider on this date.  Electronically Signed: Hoyt Koch, PA 02/17/2023, 12:35 PM   I spent a total of 40 Minutes    in face to face in clinical consultation, greater than 50% of which was counseling/coordinating care for L hydronephrosis.

## 2023-02-17 NOTE — Anesthesia Postprocedure Evaluation (Signed)
Anesthesia Post Note  Patient: Alex [name]  Procedure(s) Performed: CYSTOSCOPY WITH LITHOLAPAXY (Bladder) CYSTOSCOPY LEFT URETEROSCOPY, ATTEMPTED (Left: Renal)     Patient location during evaluation: PACU Anesthesia Type: General Level of consciousness: awake and alert Pain management: pain level controlled Vital Signs Assessment: post-procedure vital signs reviewed and stable Respiratory status: spontaneous breathing, nonlabored ventilation, respiratory function stable and patient connected to nasal cannula oxygen Cardiovascular status: blood pressure returned to baseline and stable Postop Assessment: no apparent nausea or vomiting Anesthetic complications: no  No notable events documented.  Last Vitals:  Vitals:   02/17/23 1230 02/17/23 1245  BP: (!) 142/95 (!) 155/86  Pulse: 71 68  Resp: (!) 22 20  Temp:    SpO2: 98% 98%    Last Pain:  Vitals:   02/17/23 1245  TempSrc:   PainSc: 6                  Robinette Esters S

## 2023-02-17 NOTE — Procedures (Signed)
Interventional Radiology Procedure Note  Procedure: Image guided left percutaneous nephrostomy placement  Findings: Please refer to procedural dictation for full description. 8 Fr left percutaneous nephrostomy tube, to bag drainage.  Complications: None immediate  Estimated Blood Loss: < 5 mL  Recommendations: To bag drainage. IR will follow.   Marliss Coots, MD

## 2023-02-18 DIAGNOSIS — Z7982 Long term (current) use of aspirin: Secondary | ICD-10-CM | POA: Diagnosis not present

## 2023-02-18 DIAGNOSIS — N201 Calculus of ureter: Secondary | ICD-10-CM | POA: Diagnosis not present

## 2023-02-18 DIAGNOSIS — N210 Calculus in bladder: Secondary | ICD-10-CM | POA: Diagnosis not present

## 2023-02-18 DIAGNOSIS — I2510 Atherosclerotic heart disease of native coronary artery without angina pectoris: Secondary | ICD-10-CM | POA: Diagnosis not present

## 2023-02-18 DIAGNOSIS — Z79899 Other long term (current) drug therapy: Secondary | ICD-10-CM | POA: Diagnosis not present

## 2023-02-18 DIAGNOSIS — I10 Essential (primary) hypertension: Secondary | ICD-10-CM | POA: Diagnosis not present

## 2023-02-18 DIAGNOSIS — Z951 Presence of aortocoronary bypass graft: Secondary | ICD-10-CM | POA: Diagnosis not present

## 2023-02-18 LAB — CBC
HCT: 33.9 % — ABNORMAL LOW (ref 39.0–52.0)
Hemoglobin: 10.7 g/dL — ABNORMAL LOW (ref 13.0–17.0)
MCH: 32.3 pg (ref 26.0–34.0)
MCHC: 31.6 g/dL (ref 30.0–36.0)
MCV: 102.4 fL — ABNORMAL HIGH (ref 80.0–100.0)
Platelets: 237 10*3/uL (ref 150–400)
RBC: 3.31 MIL/uL — ABNORMAL LOW (ref 4.22–5.81)
RDW: 15 % (ref 11.5–15.5)
WBC: 10.9 10*3/uL — ABNORMAL HIGH (ref 4.0–10.5)
nRBC: 0 % (ref 0.0–0.2)

## 2023-02-18 LAB — BASIC METABOLIC PANEL
Anion gap: 8 (ref 5–15)
BUN: 18 mg/dL (ref 8–23)
CO2: 22 mmol/L (ref 22–32)
Calcium: 8.2 mg/dL — ABNORMAL LOW (ref 8.9–10.3)
Chloride: 104 mmol/L (ref 98–111)
Creatinine, Ser: 0.99 mg/dL (ref 0.61–1.24)
GFR, Estimated: >60 mL/min (ref >60)
Glucose, Bld: 123 mg/dL — ABNORMAL HIGH (ref 70–99)
Potassium: 3.6 mmol/L (ref 3.5–5.1)
Sodium: 134 mmol/L — ABNORMAL LOW (ref 135–145)

## 2023-02-18 MED ORDER — POLYETHYLENE GLYCOL 3350 17 G PO PACK: 17.0000 g | PACK | Freq: Every day | ORAL | 0 refills | Status: DC

## 2023-02-18 MED ORDER — OXYCODONE HCL 5 MG PO TABS
5.0000 mg | ORAL_TABLET | Freq: Three times a day (TID) | ORAL | 0 refills | Status: DC | PRN
Start: 2023-02-18 — End: 2023-04-21

## 2023-02-18 MED ORDER — SULFAMETHOXAZOLE-TRIMETHOPRIM 800-160 MG PO TABS: 1.0000 | ORAL_TABLET | Freq: Two times a day (BID) | ORAL | 0 refills | Status: AC

## 2023-02-18 NOTE — Plan of Care (Signed)
  Problem: Education: Goal: Understanding of post-operative needs will improve Outcome: Progressing   Problem: Clinical Measurements: Goal: Postoperative complications will be avoided or minimized Outcome: Progressing   Problem: Pain Managment: Goal: General experience of comfort will improve Outcome: Progressing

## 2023-02-18 NOTE — TOC Transition Note (Signed)
Transition of Care [facility]) - CM/SW Discharge Note   Patient Details  Name: Alex [name] MRN: [id number] Date of Birth: 10/01/1931  [facility] CM/SW Contact:  Lanier Clam, RN Phone Number: 02/18/2023, 10:21 AM   Clinical Narrative: d/c home no needs.      Final next level of care: Home/Self Care Barriers to Discharge: No Barriers Identified   Patient Goals and CMS Choice CMS Medicare.gov Compare Post Acute Care list provided to:: Patient Choice offered to / list presented to : Patient  Discharge Placement                         Discharge Plan and Services Additional resources added to the After Visit Summary for     Discharge Planning Services: CM Consult                                 Social Determinants of Health (SDOH) Interventions SDOH Screenings   Food Insecurity: No Food Insecurity (02/17/2023)  Housing: Low Risk  (02/17/2023)  Transportation Needs: No Transportation Needs (02/17/2023)  Utilities: Not At Risk (02/17/2023)  Alcohol Screen: Low Risk  (07/10/2018)  Depression (PHQ2-9): Low Risk  (10/27/2022)  Financial Resource Strain: Low Risk  (09/09/2017)  Physical Activity: Insufficiently Active (09/09/2017)  Social Connections: Moderately Isolated (09/09/2017)  Stress: Stress Concern Present (09/09/2017)  Tobacco Use: Medium Risk (02/17/2023)     Readmission Risk Interventions     No data to display

## 2023-02-18 NOTE — Discharge Instructions (Signed)
Foley Catheter Care A soft, flexible tube (Foley catheter) may have been placed in your bladder to drain urine and fluid. Follow these instructions: Taking Care of the Catheter Keep the area where the catheter leaves your body clean.  Attach the catheter to the leg so there is no tension on the catheter.  Keep the drainage bag below the level of the bladder, but keep it OFF the floor.  Do not take long soaking baths. Your caregiver will give instructions about showering.  Wash your hands before touching ANYTHING related to the catheter or bag.  Using mild soap and warm water on a washcloth:  Clean the area closest to the catheter insertion site using a circular motion around the catheter.  Clean the catheter itself by wiping AWAY from the insertion site for several inches down the tube.  NEVER wipe upward as this could sweep bacteria up into the urethra (tube in your body that normally drains the bladder) and cause infection.  Place a small amount of sterile lubricant at the tip of the penis where the catheter is entering.  Taking Care of the Drainage Bags Two drainage bags may be taken home: a large overnight drainage bag, and a smaller leg bag which fits underneath clothing.  It is okay to wear the overnight bag at any time, but NEVER wear the smaller leg bag at night.  Keep the drainage bag well below the level of your bladder. This prevents backflow of urine into the bladder and allows the urine to drain freely.  Anchor the tubing to your leg to prevent pulling or tension on the catheter. Use tape or a leg strap provided by the hospital.  Empty the drainage bag when it is 1/2 to 3/4 full. Wash your hands before and after touching the bag.  Periodically check the tubing for kinks to make sure there is no pressure on the tubing which could restrict the flow of urine.  Changing the Drainage Bags Cleanse both ends of the clean bag with alcohol before changing.  Pinch off the rubber catheter to  avoid urine spillage during the disconnection.  Disconnect the dirty bag and connect the clean one.  Empty the dirty bag carefully to avoid a urine spill.  Attach the new bag to the leg with tape or a leg strap.  Cleaning the Drainage Bags Whenever a drainage bag is disconnected, it must be cleaned quickly so it is ready for the next use.  Wash the bag in warm, soapy water.  Rinse the bag thoroughly with warm water.  Soak the bag for 30 minutes in a solution of white vinegar and water (1 cup vinegar to 1 quart warm water).  Rinse with warm water.   IT Normal To See Some blood in the urine is normal, as long as you can see your fingers through the catheter tubing (not the bag) this is ok. As long as the urine is not the consistency of tomato paste.  Alex [name] urine may seem dark brown at times THIS IS NORMAL as this is old clot. As long as the catheter is draining and he is in no pain.  It will be normal to feel the urge to urinate often this is a side effect of the catheter  Some discharge around the catheter where it exits the penis is normal  SEEK MEDICAL CARE IF:  You have chills or night sweats.  You are leaking around your catheter or have problems with your catheter. It is  not uncommon to have sporadic leakage around your catheter as a result of bladder spasms. If the leakage stops, there is not much need for concern. If you are uncertain, call your caregiver.  You develop side effects that you think are coming from your medicines.  SEEK IMMEDIATE MEDICAL CARE IF:  You are suddenly unable to urinate. Check to see if there are any kinks in the drainage tubing that may cause this. If you cannot find any kinks, call your caregiver immediately. This is an emergency.  You develop shortness of breath or chest pains.  Bleeding persists or clots develop in your urine.  You have a fever.  You develop pain in your back or over your lower belly (abdomen).  You develop pain or swelling in your  legs.  Any problems you are having get worse rather than better.  MAKE SURE YOU:  Understand these instructions.  Will watch your condition.  Will get help right away if you are not doing well or get worse.      MEDICATION/ ANTICOAGULATION INSTRUCTIONS: - Patient can restart his Alex [name] on Sunday 02/20/23    Discharge instructions following Nephrostomy tube   Call your doctor for: Fevers greater than 100.5 Severe nausea or vomiting Increasing pain not controlled by pain medication Increasing redness or drainage from incisions Decreased urine output or a catheter is no longer draining  The number for questions is [phone].  Activity: Gradually increase activity with short frequent walks, 3-4 times a day.  Avoid strenuous activities, like sports, lawn-mowing, or heavy lifting (more than 10-15 pounds) for 3 weeks after sugery.  Wear loose, comfortable clothing that pull or kink the tube or tubes.  Do not drive while taking pain medication, or until your doctor permitts it.  Bathing and dressing changes: You should not shower for 48 hours after surgery.  Do not soak your back in a bathtub or submerge the incision in water for 2 weeks.   Drainage bag care: You may be discharged with a drainage bag around the site of your surgery.  The drainage bag should be secured such that it never pulls or loosens to prevent it from leaking.  It is important to wash her hands before and after emptying the drainage bag to help prevent the spread of infection.  The drainage bag should be emptied as needed.  When the wound stops draining or it is manageable with a dry gauze dressing, you can remove the bag.  Diet: It is extremely important to drink plenty of fluids after surgery, especially water.  You may resume your regular diet, unless otherwise instructed.  Medications: May take Tylenol (acetaminophen) or ibuprofen (Advil, Motrin) as directed over-the-counter. Take any prescriptions as  directed.  Follow-up appointments: Follow-up appointment will be scheduled with Dr. Jennette Bill on 10/22 for catheter removal and discussion about surgery moving forward.   You have been prescribed 2 pills of bactrim please take one of these pill 2 hours prior to your appointment on 10/22 and the other pill that evening after your appointment.

## 2023-02-18 NOTE — Discharge Summary (Signed)
Date of admission: 02/17/2023  Date of discharge: 02/18/2023  Admission diagnosis: Left ureteral stone    Discharge diagnosis: Left ureteral stone   Secondary diagnoses:  Patient Active Problem List   Diagnosis Date Noted   Ureteral stone 02/17/2023   Left ureteral stone 02/17/2023   Neuropathy of finger of right hand 05/19/2021   Ischemic cardiomyopathy 06/23/2019   ST elevation myocardial infarction (STEMI) of true posterior wall (HCC) 06/22/2019   Abscessed tooth 09/01/2016   Rhinophyma 06/05/2014   Bilateral knee pain 01/30/2014   Cerumen impaction 01/30/2014   Bilateral lower extremity edema 10/09/2013   Reflux esophagitis 04/10/2013   Left carotid artery stenosis - s/p LCEA 11/09/2012   Hyperlipidemia with target LDL less than 70    Essential hypertension    Dizziness and giddiness    Obesity    Insomnia    Benign prostatic hyperplasia with urinary obstruction    Vitamin D deficiency    Coronary artery disease involving native coronary artery of native heart with angina pectoris (HCC) 03/04/1999   Hx of CABG x4 (LIMA-LAD, RIMA-dRCA, SVG-OM2, SVG-RI) -- OCCLUDED SVG-OM 06/21/1998    Procedures performed: Procedure(s): CYSTOSCOPY WITH LITHOLAPAXY CYSTOSCOPY LEFT URETEROSCOPY, ATTEMPTED  History and Physical: For full details, please see admission history and physical. Briefly, ROI Alex [name] is a 89 y.o. year old patient with L ureteral stone and bladder stone underwent cystolithotomy and attempted L URS.   Hospital Course: Patient tolerated the procedure well then sent to IR where L nephrostomy tube was placed.  He was then transferred to the floor after an uneventful PACU stay.  His hospital course was uncomplicated.  On POD#1 he had met discharge criteria: was eating a regular diet, was up and ambulating independently,  pain was well controlled, was voiding without a catheter, and was ready to for discharge. His post op labs were stable and he had no signs of  infection.  Urine was irrigated in the morning to clear. Neph tube was in place and draining appropriately.   Physical Exam: Gen: NAD Resp: Normal WOB on RA Cards: RRR per monitor GI: soft non tender Back : L neph tube in place with light pink urine drianing well, no strike through on dressing Catheter: draining clear urine after irrigation, drianing well    Laboratory values:  Recent Labs    02/17/23 0912 02/18/23 0513  WBC  --  10.9*  HGB 12.2* 10.7*  HCT 36.0* 33.9*   Recent Labs    02/17/23 0912 02/18/23 0513  NA 143 134*  K 3.7 3.6  CL 106 104  CO2  --  22  GLUCOSE 99 123*  BUN 15 18  CREATININE 1.20 0.99  CALCIUM  --  8.2*   No results for input(s): "LABPT", "INR" in the last 72 hours. No results for input(s): "LABURIN" in the last 72 hours. Results for orders placed or performed in visit on 12/17/22  Culture, Urine     Status: None   Collection Time: 12/21/22  1:33 PM   Specimen: Urine  Result Value Ref Range Status   MICRO NUMBER: [id number]  Final   SPECIMEN QUALITY: Adequate  Final   Sample Source URINE, CLEAN CATCH  Final   STATUS: FINAL  Final   Result: No Growth  Final      Disposition: Home  Discharge instruction:  - hold Plavix until 10/20 - Patient has f/u for catheter removal 10/22 - patient will be called for antigrade nephrostogram    Discharge medications:  Allergies as of 02/18/2023   No Known Allergies      Medication List     TAKE these medications    Alpha-Lipoic Acid 200 MG Caps Take 200 mg by mouth.   ascorbic acid 500 MG tablet Commonly known as: VITAMIN C Take 500-1,000 mg by mouth daily.   b complex vitamins tablet Take 1 tablet by mouth daily.   BOSWELLIA PO Take 800 mg by mouth.   Cinnamon 500 MG Tabs Take 500 mg by mouth 3 (three) times a week.   CITRUS BERGAMOT PO Take 1 capsule by mouth 2 (two) times a week.   clopidogrel 75 MG tablet Commonly known as: PLAVIX TAKE 1 TABLET(75 MG) BY MOUTH  DAILY What changed: See the new instructions.   Co Q 10 100 MG Caps Take 100 mg by mouth daily.   diazepam 5 MG tablet Commonly known as: VALIUM TAKE 1/2 TABLET(2.5 MG) BY MOUTH TWICE DAILY What changed:  how much to take how to take this when to take this   doxepin 75 MG capsule Commonly known as: SINEQUAN Take 1 capsule (75 mg total) by mouth at bedtime.   finasteride 5 MG tablet Commonly known as: PROSCAR Take 5 mg by mouth daily.   Fluticasone-Salmeterol 250-50 MCG/DOSE Aepb Commonly known as: ADVAIR Inhale 1 puff into the lungs daily as needed.   Grape Seed Oil Take 1 capsule by mouth daily.   metoprolol succinate 50 MG 24 hr tablet Commonly known as: TOPROL-XL Take one tablet by mouth once daily. Take with or immediately following a meal. What changed:  how much to take how to take this when to take this   nitroGLYCERIN 0.4 MG SL tablet Commonly known as: Nitrostat Place 1 tablet (0.4 mg total) under the tongue every 5 (five) minutes as needed. What changed: reasons to take this   oxyCODONE 5 MG immediate release tablet Commonly known as: Roxicodone Take 1 tablet (5 mg total) by mouth every 8 (eight) hours as needed for up to 3 doses. Please use for break through pain if needed   polyethylene glycol 17 g packet Commonly known as: MiraLax Take 17 g by mouth daily.   ramipril 10 MG capsule Commonly known as: ALTACE TAKE 1 CAPSULE BY MOUTH EVERY DAY What changed:  how much to take how to take this when to take this   Resveratrol 100 MG Caps Take 100 mg by mouth daily.   rosuvastatin 40 MG tablet Commonly known as: CRESTOR Take 1 tablet (40 mg total) by mouth daily.   sulfamethoxazole-trimethoprim 800-160 MG tablet Commonly known as: Bactrim DS Take 1 tablet by mouth 2 (two) times daily for 2 doses. Please take first pill the morning of your appointment with Dr. Jennette Bill then second pill that evening.   tamsulosin 0.4 MG Caps capsule Commonly  known as: FLOMAX Take 1 capsule (0.4 mg total) by mouth daily.   TURMERIC PO Take 1 capsule by mouth as needed.   VITAMIN B-12 PO Take 1 tablet by mouth daily.   Vitamin D3 125 MCG (5000 UT) Caps Take 5,000 Units by mouth daily.          Followup:   Follow-up Information     Adonis Brook, MD Follow up on 02/22/2023.   Specialty: Urology Why: Appointment is at 915 AM please take antibiotic prior to arrival. Contact information: [address] [place] [address] [phone]

## 2023-02-21 ENCOUNTER — Encounter (HOSPITAL_BASED_OUTPATIENT_CLINIC_OR_DEPARTMENT_OTHER): Payer: Self-pay | Admitting: Urology

## 2023-02-21 NOTE — Plan of Care (Signed)
[facility] Tonsillectomy/Adenoidectomy, Postoperative PEDS care plan entered in error.

## 2023-02-22 DIAGNOSIS — R3914 Feeling of incomplete bladder emptying: Secondary | ICD-10-CM | POA: Diagnosis not present

## 2023-02-22 DIAGNOSIS — N202 Calculus of kidney with calculus of ureter: Secondary | ICD-10-CM | POA: Diagnosis not present

## 2023-02-22 DIAGNOSIS — N400 Enlarged prostate without lower urinary tract symptoms: Secondary | ICD-10-CM | POA: Diagnosis not present

## 2023-02-24 ENCOUNTER — Other Ambulatory Visit: Payer: Self-pay | Admitting: Urology

## 2023-02-28 ENCOUNTER — Telehealth: Payer: Self-pay | Admitting: Cardiology

## 2023-02-28 NOTE — Telephone Encounter (Signed)
   Patient Name: Alex [name]  DOB: 09/03/1931 MRN: [id number]  Primary Cardiologist: Bryan Lemma, MD  Chart reviewed as part of pre-operative protocol coverage.   Per office protocol, he may hold Plavix for 5 days prior to procedure. Please resume Plavix as soon as possible postprocedure, at the discretion of the surgeon.   Napoleon Form, Leodis Rains, NP 02/28/2023, 11:18 AM

## 2023-02-28 NOTE — Telephone Encounter (Signed)
   Pre-operative Risk Assessment    Patient Name: Alex [name]  DOB: May 20, 1931 MRN: [id number]      Request for Surgical Clearance    Procedure:  Surgery CYSTOSCOPY WITH LEFT ANTEGRADE URETEROSCOPY AND ANTEGRADE STENT PLACEMENT   Date of Surgery:  Clearance 03/03/23                                 Surgeon:  Dr. Gae Gallop Group or Practice Name:  [facility]  Phone number:  [phone] Fax number:  [phone]   Type of Clearance Requested:   - Pharmacy:  Hold Clopidogrel (Plavix)     Type of Anesthesia:  General    Additional requests/questions:    SignedFrancesca Jewett   02/28/2023, 10:24 AM

## 2023-03-01 NOTE — Anesthesia Preprocedure Evaluation (Signed)
Anesthesia Evaluation  Patient identified by MRN, date of birth, ID band Patient awake    Reviewed: Allergy & Precautions, H&P , NPO status , Patient's Chart, lab work & pertinent test results, reviewed documented beta blocker date and time   Airway Mallampati: II       Dental no notable dental hx.    Pulmonary COPD, former smoker   Pulmonary exam normal        Cardiovascular hypertension, Pt. on home beta blockers + CAD, + Past MI, + Cardiac Stents, + CABG and +CHF  Normal cardiovascular exam     Neuro/Psych negative neurological ROS  negative psych ROS   GI/Hepatic negative GI ROS, Neg liver ROS,,,  Endo/Other  negative endocrine ROS    Renal/GU negative Renal ROS  negative genitourinary   Musculoskeletal   Abdominal  (+) + obese  Peds negative pediatric ROS (+)  Hematology  (+) Blood dyscrasia, anemia   Anesthesia Other Findings  CV: Echo 06/23/2019 1. Left ventricular ejection fraction, by estimation, is 45 to 50%. The  left ventricle has mildly decreased function. The left ventricle has no  regional wall motion abnormalities. Left ventricular diastolic parameters  are consistent with Grade I  diastolic dysfunction (impaired relaxation).   2. Right ventricular systolic function is normal. The right ventricular  size is normal.   3. The mitral valve is normal in structure and function. Mild mitral  valve regurgitation. No evidence of mitral stenosis.   4. The aortic valve is normal in structure and function. Aortic valve  regurgitation is trivial. No aortic stenosis is present.   5. The inferior vena cava is normal in size with greater than 50%     Reproductive/Obstetrics negative OB ROS                             Anesthesia Physical Anesthesia Plan  ASA: 3  Anesthesia Plan: General   Post-op Pain Management: Minimal or no pain anticipated   Induction: Intravenous  PONV  Risk Score and Plan: 2 and Ondansetron, Dexamethasone and Treatment may vary due to age or medical condition  Airway Management Planned: Oral ETT  Additional Equipment: None  Intra-op Plan:   Post-operative Plan: Extubation in OR  Informed Consent: I have reviewed the patients History and Physical, chart, labs and discussed the procedure including the risks, benefits and alternatives for the proposed anesthesia with the patient or authorized representative who has indicated his/her understanding and acceptance.     Dental advisory given  Plan Discussed with: CRNA  Anesthesia Plan Comments: (See PAT note 03/02/23)       Anesthesia Quick Evaluation

## 2023-03-01 NOTE — Progress Notes (Signed)
Anesthesia Chart Review   Case: [id number] Date/Time: 03/03/23 1245   Procedure: CYSTOSCOPY WITH LEFT ANTEGRADE URETEROSCOPY AND ANTEGRADE STENT PLACEMENT (Left) - 90 MINS   Anesthesia type: General   Pre-op diagnosis: LEFT URETERAL STONE   Location: [facility] ROOM 01 / [facility]   Surgeons: Adonis Brook, MD       DISCUSSION:89 y.o. smoker with h/o HTN, ischemic cardiomyopathy, CAD s/p DES 2021, emphysema, carotid stenosis s/p left CEA 2015, left ureteral stone scheduled for above procedure 03/03/2023 with Dr. Vilma Prader.    Per cardiology note 02/28/2023 pt can hold Plavix 5 days prior to procedure.   S/p cystoscopy 02/17/2023 with no anesthesia complications. Prior to this procedure cardiology provided preoperative evaluation.  Per cardio note 02/11/2023, "According to the Revised Cardiac Risk Index (RCRI), his Perioperative Risk of Major Cardiac Event is (%): 0.9. His Functional Capacity in METs is: 5.62 according to the Duke Activity Status Index (DASI). Therefore, based on ACC/AHA guidelines, patient would be at acceptable risk for the planned procedure without further cardiovascular testing.   The patient was advised that if he develops new symptoms prior to surgery to contact our office to arrange for a follow-up visit, and he verbalized understanding.   Per office protocol, he may hold Plavix for 5 days prior to procedure. Please resume Plavix as soon as possible postprocedure, at the discretion of the surgeon."   VS: There were no vitals taken for this visit.  PROVIDERS: Octavia Heir, NP is PCP   Cardiologist - Bryan Lemma, MD  LABS: Labs reviewed: Acceptable for surgery. (all labs ordered are listed, but only abnormal results are displayed)  Labs Reviewed - No data to display   IMAGES:   EKG:   CV: Echo 06/23/2019 1. Left ventricular ejection fraction, by estimation, is 45 to 50%. The  left ventricle has mildly decreased function. The left ventricle has no   regional wall motion abnormalities. Left ventricular diastolic parameters  are consistent with Grade I  diastolic dysfunction (impaired relaxation).   2. Right ventricular systolic function is normal. The right ventricular  size is normal.   3. The mitral valve is normal in structure and function. Mild mitral  valve regurgitation. No evidence of mitral stenosis.   4. The aortic valve is normal in structure and function. Aortic valve  regurgitation is trivial. No aortic stenosis is present.   5. The inferior vena cava is normal in size with greater than 50%  respiratory variability, suggesting right atrial pressure of 3 mmHg.  Past Medical History:  Diagnosis Date   Acquired deformity of nose    Arthritis    Benign localized prostatic hyperplasia with lower urinary tract symptoms (LUTS)    Bilateral lower extremity edema    chronic   Bladder calculus    Cervicalgia    Chronic heart failure with preserved ejection fraction (HFpEF) (HCC)    followed by cardiology---- per last echo in epic   20-20-2021  EF ~45-50%   Complication of anesthesia    history post op urinary retension 02/ 2016 post op Left CEA   Emphysema lung (HCC)    per pt has advair prn   Essential hypertension    History of esophagitis    History of kidney stones    History of nonmelanoma skin cancer    History of ST elevation myocardial infarction (STEMI) 06/22/2019   True posterior STEMI -> occluded SVG.  Native LM--LCX PCI w/ DES   Hyperlipidemia    Insomnia,  unspecified    Internal hemorrhoids without mention of complication    Ischemic cardiomyopathy    last echo in epic 06-23-2019  ef 45-50%   Left carotid artery stenosis    S/P L CEA - 2015 (Dr. Myra Gianotti)   Left ureteral calculus    Multiple vessel coronary artery disease 2000   cardiologist--- dr Herbie Baltimore ;   cath 2000 ~50% LM-ostLAD with 85% ost-mid LCx, 80% ost RI, 100% CTO RCA -> CABG X 4 (LIMA-LAD, RIMA-dRCA, SVG-OM2, SVG-RI) (Dr. Dorris Fetch) =>  06/2019:  posterior STEMI : 100% SVG-OM2 (unable to open), 100% OM2; DES PCI LM-mLCx.   Pre-diabetes    S/P drug eluting coronary stent placement 06/22/2019   PCI and DES x1  to LM -- mLCx   Wears glasses     Past Surgical History:  Procedure Laterality Date   COLONOSCOPY WITH ESOPHAGOGASTRODUODENOSCOPY (EGD)  2007   Dr Virginia Rochester   CORONARY ARTERY BYPASS GRAFT  09/11/1998   @ [facility] by Dr. Kathie Rhodes.  [place] - CABG x 4:  LIMA-LAD, RIMA-dRCA, SVG-OM2, SVG-RI   CORONARY STENT INTERVENTION N/A 06/22/2019   Procedure: CORONARY STENT INTERVENTION;  Surgeon: Marykay Lex, MD;  Location: [facility] INVASIVE CV LAB; unsuccessful attempt at revascularization of SVG-OM2 => Synergy DES PCI from LM-mid LCx (Synergy DES 2.5 mm x 38 mm--> 3.1-2.6 mm); unfortunately OM2 was occluded.  This revascularized OM1, OM 3 and OM 4.   CORONARY/GRAFT ACUTE MI REVASCULARIZATION N/A 06/22/2019   Procedure: CORONARY/GRAFT ACUTE MI REVASCULARIZATION;  Surgeon: Marykay Lex, MD;  Location: [facility] INVASIVE CV LAB;; unsuccessful PTCA/aspiration thrombectomy of SVG-OM1 as culprit lesion for STEMI   CYSTOSCOPY WITH LITHOLAPAXY N/A 02/17/2023   Procedure: CYSTOSCOPY WITH LITHOLAPAXY;  Surgeon: Adonis Brook, MD;  Location[facility];  Service: Urology;  Laterality: N/A;  60 MINUTES   CYSTOSCOPY/URETEROSCOPY/HOLMIUM LASER/STENT PLACEMENT Left 02/17/2023   Procedure: CYSTOSCOPY LEFT URETEROSCOPY, ATTEMPTED;  Surgeon: Adonis Brook, MD;  Location: [facility];  Service: Urology;  Laterality: Left;   ENDARTERECTOMY Left 06/14/2014   Procedure: LEFT CAROTID ENDARTERECTOMY WITH VASCUGUARD PATCH ANGIOPLASTY;  Surgeon: Nada Libman, MD;  Location: [facility];  Service: Vascular;  Laterality: Left;   EXCISIONAL HEMORRHOIDECTOMY  1981   EXTRACORPOREAL SHOCK WAVE LITHOTRIPSY  2005   INCISION AND DRAINAGE Right 06/06/2016   @[facility]    right mandible area for abscess   IR NEPHROSTOMY PLACEMENT LEFT  02/17/2023    LEFT HEART CATH AND CORONARY ANGIOGRAPHY  2000   MV CAD --> CABG x 4   LEFT HEART CATH AND CORS/GRAFTS ANGIOGRAPHY N/A 06/22/2019   Procedure: LEFT HEART CATH AND CORS/GRAFTS ANGIOGRAPHY;  Surgeon: Marykay Lex, MD;  CULPRIT LESION = 100% SVG-OM2; Severe native CAD: mLM-Ost LAD-50% w/ Ost LCx 85% (DES PCI OM-OstLCx)- Treated b/c 100% TO SVG-OM2 as Culprit lesion - unable to open. OM2 100%. Ost RI 80%, Ost-mid LAD 50% - 100% mLAD after D1; ost RI 80%, mid RCA 100%; Patent SVG-RI, pedLIMA-dLAD, pedRIMA-dRCA; EF 45-50%   MULTIPLE TOOTH EXTRACTIONS  06/01/2016   @[facility]     due to abscesses   TONSILLECTOMY  1940   TRANSTHORACIC ECHOCARDIOGRAM  06/23/2019   Inferolateral STEMI (occluded SVG-OM2): (Definity) mildly reduced LV function-EF 45 to 50%.  GR 1 DD.  No obvious R WMA.  Essentially normal valves.  Mild MR.    MEDICATIONS:  Alpha-Lipoic Acid 200 MG CAPS   ascorbic acid (VITAMIN C) 500 MG tablet   b complex vitamins tablet   Boswellia Serrata (  BOSWELLIA PO)   Cholecalciferol (VITAMIN D3) 125 MCG (5000 UT) CAPS   Cinnamon 500 MG TABS   CITRUS BERGAMOT PO   clopidogrel (PLAVIX) 75 MG tablet   Coenzyme Q10 (CO Q 10) 100 MG CAPS   Cyanocobalamin (VITAMIN B-12 PO)   diazepam (VALIUM) 5 MG tablet   doxepin (SINEQUAN) 75 MG capsule   finasteride (PROSCAR) 5 MG tablet   Fluticasone-Salmeterol (ADVAIR) 250-50 MCG/DOSE AEPB   Grape Seed OIL   metoprolol succinate (TOPROL-XL) 50 MG 24 hr tablet   nitroGLYCERIN (NITROSTAT) 0.4 MG SL tablet   oxyCODONE (ROXICODONE) 5 MG immediate release tablet   polyethylene glycol (MIRALAX) 17 g packet   ramipril (ALTACE) 10 MG capsule   RESVERATROL 100 MG CAPS   rosuvastatin (CRESTOR) 40 MG tablet   tamsulosin (FLOMAX) 0.4 MG CAPS capsule   TURMERIC PO   No current facility-administered medications for this encounter.      Jodell Cipro Ward, PA-C [facility] Pre-Surgical Testing [phone]

## 2023-03-01 NOTE — Progress Notes (Signed)
COVID Vaccine received:  []  No [x]  Yes Date of any COVID positive Test in last 90 days:  PCP -  Hazle Nordmann, NP Cardiologist - Bryan Lemma, MD   Chest x-ray - 06-22-2019  1v  Epic EKG -  10-20-2022  Epic Stress Test -  ECHO - 06-23-2019  Epic Cardiac Cath - 06-22-2019 (STEMI) [facility] - DESx1 by  PCR screen: []  Ordered & Completed []   No Order but Needs PROFEND     [x]   N/A for this surgery  Surgery Plan:  []  Ambulatory   [x]  Outpatient in bed  []  Admit Anesthesia:    [x]  General  []  Spinal  []   Choice []   MAC  Bowel Prep - [x]  No  []   Yes ______  Pacemaker / ICD device [x]  No []  Yes   Spinal Cord Stimulator:[x]  No []  Yes       History of Sleep Apnea? [x]  No []  Yes   CPAP used?- [x]  No []  Yes    Does the patient monitor blood sugar?   []  N/A   []  No []  Yes  Patient has: []  NO Hx DM   [x]  Pre-DM   []  DM1  []   DM2 Last A1c was:        on       Blood Thinner / Instructions: Plavix  Hold x 5 days  ? Last dose was taken on 02-25-2023? Aspirin Instructions: none  ERAS Protocol Ordered: [x]  No  []  Yes Patient is to be NPO after: midnight prior  Dental hx: []  Dentures:  []  N/A      []  Bridge or Partial:                   []  Loose or Damaged teeth:   Comments:   Activity level: Patient is able / unable to climb a flight of stairs without difficulty; []  No CP  []  No SOB, but would have ___   Patient can / can not perform ADLs without assistance.   Anesthesia review: STEMI- DES x1, CABG x4,  HTN, emphysema, Pre-DM  Ischemic CM,   Patient denies shortness of breath, fever, cough and chest pain at PAT appointment.  Patient verbalized understanding and agreement to the Pre-Surgical Instructions that were given to them at this PAT appointment. Patient was also educated of the need to review these PAT instructions again prior to his surgery.I reviewed the appropriate phone numbers to call if they have any and questions or concerns.

## 2023-03-01 NOTE — H&P (Incomplete)
CC/HPI: 89 year old male who is a left ureteral stone status post cystolitholapaxy as well as attempted left ureteroscopy where he is unable to get access to the left ureter so neph tube was placed at that time this occurred on 02/17/2023. He returns back today he is having some pain at the tip of his penis when moving around but otherwise is tolerating the procedure well he restarted his Plavix yesterday he is also tolerant rating the nephrostomy tube well. We discussed his upcoming surgery of antegrade ureteroscopy with laser lithotripsy patient's was agreeable we will continue to hold keep the catheter for now as patient is getting improved sleep as concerned of his previous elevated PVRs to 1000 that he will fail his void trial will do void trial at time of surgery.  HE is here today for L antegrade URS w/ LL. Antegrade pyelogram. Antegrade stent placement.      ALLERGIES: No Allergies    MEDICATIONS: Metoprolol Succinate 50 mg tablet, extended release 24 hr  Proscar 5 mg tablet 1 tablet PO Daily  Tamsulosin Hcl 0.4 mg capsule 1 capsule PO Q HS  Advair Diskus 250 mcg-50 mcg/dose blister, with inhalation device Inhalation  B-Complex 400 mcg tablet Oral  Cinnamon 500 MG Oral Tablet Oral  Citrus Bergamot 100 % powder Oral  Clopidogrel 75 mg tablet  Co Q10 60 mg capsule Oral  DiazePAM 5 MG Oral Tablet Oral  Doxepin Hcl 25 mg capsule Oral  Ibuprofen 200 mg tablet Oral  Lorazepam 1 mg tablet Oral  Potassium TABS Oral  Ramipril 5 mg capsule Oral  Resveratrol 100 MG Oral Capsule Oral  Rosuvastatin Calcium 40 mg tablet  Vitamin D3 50 mcg (2,000 unit) tablet Oral     GU PSH: ESWL - 2005       PSH Notes: CABG (CABG), Bypass Graft (Non-Vein) Carotid, Kidney Surgery   NON-GU PSH: Artery Bypass Graft - 2016 CABG (coronary artery bypass grafting) - 2016 Carotid Endarterectomy, Left - 2016 Colonoscopy - 2007 Coronary Artery Bypass Grafting - 2000 Esophagus Endoscopy -  2007 Hemorrhoidectomy - 1981 Tonsillectomy - 1940     GU PMH: BPH w/o LUTS - 01/24/2023, Benign prostatic hypertrophy without lower urinary tract symptoms, - 2016 Hydronephrosis - 01/24/2023 Incomplete bladder emptying - 01/24/2023 Renal and ureteral calculus - 01/24/2023 Urinary Frequency - 01/24/2023 Urinary Urgency - 01/24/2023 Urinary Retention, Unspec, Incomplete bladder emptying - 2016 Renal calculus    NON-GU PMH: Encounter for general adult medical examination without abnormal findings, Encounter for preventive health examination - 2016 Myocardial Infarction, History of myocardial infarction - 2016 Personal history of other diseases of the circulatory system, History of atrial fibrillation - 2016, History of hypertension, - 2016 Hypertension Sleep Apnea    FAMILY HISTORY: 2 daughters - Daughter 2 sons - Son ALS-parkinsonism/Dementia Complex 1 - Father Deceased - Father, Mother liver cancer - Mother No pertinent family history - Other No significant past medical history - Runs In Family Prostate Cancer - Brother   SOCIAL HISTORY: Marital Status: Widowed Preferred Language: English; Ethnicity: Not Hispanic Or Latino; Race: White Current Smoking Status: Patient does not smoke anymore. Has not smoked since 01/01/1974. Smoked for 24 years.   Tobacco Use Assessment Completed: Used Tobacco in last 30 days? Does not use smokeless tobacco. Drinks 2 drinks per week.  Patient's occupation is/was retired.     Notes: Former smoker, Daily caffeine consumption, 2-3 servings a day, Alcohol use, Retired, Widower, Four children   REVIEW OF SYSTEMS:    GU Review Male:  Patient denies frequent urination, hard to postpone urination, burning/ pain with urination, get up at night to urinate, leakage of urine, stream starts and stops, trouble starting your stream, have to strain to urinate , erection problems, and penile pain.  Gastrointestinal (Upper):   Patient denies nausea, vomiting, and  indigestion/ heartburn.  Gastrointestinal (Lower):   Patient denies diarrhea and constipation.  Constitutional:   Patient denies fever, night sweats, weight loss, and fatigue.  Skin:   Patient denies skin rash/ lesion and itching.  Eyes:   Patient denies double vision and blurred vision.  Ears/ Nose/ Throat:   Patient denies sore throat and sinus problems.  Hematologic/Lymphatic:   Patient denies swollen glands and easy bruising.  Cardiovascular:   Patient denies leg swelling and chest pains.  Respiratory:   Patient denies cough and shortness of breath.  Endocrine:   Patient denies excessive thirst.  Musculoskeletal:   Patient denies back pain and joint pain.  Neurological:   Patient denies headaches and dizziness.  Psychologic:   Patient denies depression and anxiety.   Notes: Catheter in place    VITAL SIGNS: None   GU PHYSICAL EXAMINATION:    Penis: Are in place draining clear yellow urine   MULTI-SYSTEM PHYSICAL EXAMINATION:    Constitutional: Well-nourished. No physical deformities. Normally developed. Good grooming.  Respiratory: No labored breathing, no use of accessory muscles.   Cardiovascular: Normal temperature, normal extremity pulses, no swelling, no varicosities.  Ears, Nose, Mouth, and Throat: Ostomy tube in flank draining light pink urine.   Musculoskeletal: Normal gait and station of head and neck.     Complexity of Data:  Records Review:   Previous Patient Records  X-Ray Review: C.T. Abdomen/Pelvis: Reviewed Films. Reviewed Report. Left ureteral stone still present    12/20/03  PSA  Total PSA 2.26     PROCEDURES: None   ASSESSMENT:      ICD-10 Details  1 GU:   Renal and ureteral calculus - N20.2 Acute, Stable  2   BPH w/o LUTS - N40.0 Chronic, Stable  3   Incomplete bladder emptying - R39.14 Chronic, Stable   PLAN:           Orders Labs Urine Culture, Urine Culture  Lab Notes: Left nephrostomy tube sample          Schedule Return Visit/Planned  Activity: 3 Weeks - Nurse Visit             Note: Catheter exchange          Document Letter(s):  Created for Patient: Clinical Summary         Notes:   Urinary retention: Elevated PVRs to 1 L prior to this we will continue catheter as it is improving his sleep he is having some pain provided leg bag as well as lubrication. Discussed consideration of long-term catheter with the patient.   Left ureteral stone: Will plan for antegrade ureteroscopy in the OR next available. We discussed risk benefits alternatives to procedure including bleeding requiring blood as well as possible embolization of the kidney, damage to the ureter as well as being unable to get a stent to the distal aspect of the ureter that we had trouble in the first surgery. Damage to the kidney. As well as bleeding as well as infection. Patient is voices understanding and consent was obtained.   Will plan to hold Plavix for 5 days prior, urine culture from nephrostomy tube and catheter performed today.  Cardiac medical clearance uploaded to the  chart.  Will attempt to do void trial after completion of the surgery.   Plan for antegraede URS w/ LL today.

## 2023-03-01 NOTE — Patient Instructions (Signed)
SURGICAL WAITING ROOM VISITATION Patients having surgery or a procedure may have no more than 2 support people in the waiting area - these visitors may rotate in the visitor waiting room.   Due to an increase in RSV and influenza rates and associated hospitalizations, children ages 55 and under may not visit patients in [facility] hospitals. If the patient needs to stay at the hospital during part of their recovery, the visitor guidelines for inpatient rooms apply.  PRE-OP VISITATION  Pre-op nurse will coordinate an appropriate time for 1 support person to accompany the patient in pre-op.  This support person may not rotate.  This visitor will be contacted when the time is appropriate for the visitor to come back in the pre-op area.  Please refer to the [facility] website for the visitor guidelines for Inpatients (after your surgery is over and you are in a regular room).  You are not required to quarantine at this time prior to your surgery. However, you must do this: Hand Hygiene often Do NOT share personal items Notify your provider if you are in close contact with someone who has COVID or you develop fever 100.4 or greater, new onset of sneezing, cough, sore throat, shortness of breath or body aches.  If you test positive for Covid or have been in contact with anyone that has tested positive in the last 10 days please notify you surgeon.    Your procedure is scheduled on:  THURSDAY  March 03, 2023  Report to [facility] Main Entrance: [address] entrance where the [employer] is available.   Report to admitting at:  10:45   AM  Call this number if you have any questions or problems the morning of surgery [phone]  DO NOT EAT OR DRINK ANYTHING AFTER MIDNIGHT THE NIGHT PRIOR TO YOUR SURGERY / PROCEDURE.   FOLLOW  ANY ADDITIONAL PRE OP INSTRUCTIONS YOU RECEIVED FROM YOUR SURGEON'S OFFICE!!!   Oral Hygiene is also important to reduce your risk of infection.         Remember - BRUSH YOUR TEETH THE MORNING OF SURGERY WITH YOUR REGULAR TOOTHPASTE  Do NOT smoke after Midnight the night before surgery.  STOP TAKING all Vitamins, Herbs and supplements 1 week before your surgery.   Take ONLY these medicines the morning of surgery with A SIP OF WATER: Metoprolol, Finasteride ??   You may take Oxycodone if needed for pain. You may use your Advair inhaler if needed.                     You may not have any metal on your body including jewelry, and body piercing  Do not wear , lotions, powders,  cologne, or deodorant  Men may shave face and neck.  Contacts, Hearing Aids, dentures or bridgework may not be worn into surgery. DENTURES WILL BE REMOVED PRIOR TO SURGERY PLEASE DO NOT APPLY "Poly grip" OR ADHESIVES!!!  You may bring a small overnight bag with you on the day of surgery, only pack items that are not valuable. [facility] IS NOT RESPONSIBLE   FOR VALUABLES THAT ARE LOST OR STOLEN.   Do not bring your home medications to the hospital. The Pharmacy will dispense medications listed on your medication list to you during your admission in the Hospital.  Special Instructions: Bring a copy of your healthcare power of attorney and living will documents the day of surgery, if you wish to have them scanned into your [place]  Medical Records- EPIC  Please read over the following fact sheets you were given: IF YOU HAVE QUESTIONS ABOUT YOUR PRE-OP INSTRUCTIONS, PLEASE CALL [phone].   [facility] - Preparing for Surgery Before surgery, you can play an important role.  Because skin is not sterile, your skin needs to be as free of germs as possible.  You can reduce the number of germs on your skin by washing with CHG (chlorahexidine gluconate) soap before surgery.  CHG is an antiseptic cleaner which kills germs and bonds with the skin to continue killing germs even after washing. Please DO NOT use if you have an allergy to CHG or antibacterial soaps.  If your  skin becomes reddened/irritated stop using the CHG and inform your nurse when you arrive at Short Stay. Do not shave (including legs and underarms) for at least 48 hours prior to the first CHG shower.  You may shave your face/neck.  Please follow these instructions carefully:  1.  Shower with CHG Soap the night before surgery and the  morning of surgery.  2.  If you choose to wash your hair, wash your hair first as usual with your normal  shampoo.  3.  After you shampoo, rinse your hair and body thoroughly to remove the shampoo.                             4.  Use CHG as you would any other liquid soap.  You can apply chg directly to the skin and wash.  Gently with a scrungie or clean washcloth.  5.  Apply the CHG Soap to your body ONLY FROM THE NECK DOWN.   Do not use on face/ open                           Wound or open sores. Avoid contact with eyes, ears mouth and genitals (private parts).                       Wash face,  Genitals (private parts) with your normal soap.             6.  Wash thoroughly, paying special attention to the area where your  surgery  will be performed.  7.  Thoroughly rinse your body with warm water from the neck down.  8.  DO NOT shower/wash with your normal soap after using and rinsing off the CHG Soap.            9.  Pat yourself dry with a clean towel.            10.  Wear clean pajamas.            11.  Place clean sheets on your bed the night of your first shower and do not  sleep with pets.  ON THE DAY OF SURGERY : Do not apply any lotions/deodorants the morning of surgery.  Please wear clean clothes to the hospital/surgery center.    FAILURE TO FOLLOW THESE INSTRUCTIONS MAY RESULT IN THE CANCELLATION OF YOUR SURGERY  PATIENT SIGNATURE_________________________________  NURSE SIGNATURE__________________________________  ________________________________________________________________________

## 2023-03-02 ENCOUNTER — Encounter (HOSPITAL_COMMUNITY)
Admission: RE | Admit: 2023-03-02 | Discharge: 2023-03-02 | Disposition: A | Discharge status: Home / Self Care | Payer: Medicare HMO | Source: Ambulatory Visit | Attending: Urology | Admitting: Urology

## 2023-03-02 ENCOUNTER — Encounter (HOSPITAL_COMMUNITY): Payer: Self-pay

## 2023-03-02 ENCOUNTER — Other Ambulatory Visit: Payer: Self-pay

## 2023-03-02 VITALS — BP 117/60 | HR 75 | Temp 99.5°F | Resp 16 | Ht 70.0 in | Wt 211.4 lb

## 2023-03-02 DIAGNOSIS — Z01818 Encounter for other preprocedural examination: Secondary | ICD-10-CM

## 2023-03-02 DIAGNOSIS — J439 Emphysema, unspecified: Secondary | ICD-10-CM | POA: Insufficient documentation

## 2023-03-02 DIAGNOSIS — N201 Calculus of ureter: Secondary | ICD-10-CM | POA: Diagnosis not present

## 2023-03-02 DIAGNOSIS — I2510 Atherosclerotic heart disease of native coronary artery without angina pectoris: Secondary | ICD-10-CM | POA: Diagnosis not present

## 2023-03-02 DIAGNOSIS — D649 Anemia, unspecified: Secondary | ICD-10-CM | POA: Diagnosis not present

## 2023-03-02 DIAGNOSIS — I5032 Chronic diastolic (congestive) heart failure: Secondary | ICD-10-CM | POA: Diagnosis not present

## 2023-03-02 DIAGNOSIS — I110 Hypertensive heart disease with heart failure: Secondary | ICD-10-CM | POA: Insufficient documentation

## 2023-03-02 DIAGNOSIS — Z01812 Encounter for preprocedural laboratory examination: Secondary | ICD-10-CM | POA: Diagnosis not present

## 2023-03-02 DIAGNOSIS — I255 Ischemic cardiomyopathy: Secondary | ICD-10-CM | POA: Diagnosis not present

## 2023-03-02 HISTORY — DX: Acute myocardial infarction, unspecified: I21.9

## 2023-03-02 HISTORY — DX: Anemia, unspecified: D64.9

## 2023-03-02 HISTORY — DX: Anxiety disorder, unspecified: F41.9

## 2023-03-02 HISTORY — DX: Gastro-esophageal reflux disease without esophagitis: K21.9

## 2023-03-02 LAB — CBC
HCT: 36.1 % — ABNORMAL LOW (ref 39.0–52.0)
Hemoglobin: 11.6 g/dL — ABNORMAL LOW (ref 13.0–17.0)
MCH: 32.3 pg (ref 26.0–34.0)
MCHC: 32.1 g/dL (ref 30.0–36.0)
MCV: 100.6 fL — ABNORMAL HIGH (ref 80.0–100.0)
Platelets: 272 10*3/uL (ref 150–400)
RBC: 3.59 MIL/uL — ABNORMAL LOW (ref 4.22–5.81)
RDW: 14.8 % (ref 11.5–15.5)
WBC: 9.1 10*3/uL (ref 4.0–10.5)
nRBC: 0 % (ref 0.0–0.2)

## 2023-03-02 LAB — TYPE AND SCREEN
ABO/RH(D): A POS
Antibody Screen: NEGATIVE

## 2023-03-03 ENCOUNTER — Ambulatory Visit (HOSPITAL_BASED_OUTPATIENT_CLINIC_OR_DEPARTMENT_OTHER): Payer: Medicare HMO | Admitting: Anesthesiology

## 2023-03-03 ENCOUNTER — Other Ambulatory Visit: Payer: Self-pay

## 2023-03-03 ENCOUNTER — Encounter (HOSPITAL_COMMUNITY): Payer: Self-pay | Admitting: Urology

## 2023-03-03 ENCOUNTER — Ambulatory Visit (HOSPITAL_COMMUNITY): Payer: Medicare HMO | Admitting: Physician Assistant

## 2023-03-03 ENCOUNTER — Encounter (HOSPITAL_COMMUNITY)
Admission: RE | Disposition: A | Discharge status: Home / Self Care | Payer: Self-pay | Source: Home / Self Care | Attending: Urology

## 2023-03-03 ENCOUNTER — Ambulatory Visit (HOSPITAL_COMMUNITY): Payer: Medicare HMO

## 2023-03-03 ENCOUNTER — Observation Stay (HOSPITAL_COMMUNITY)
Admission: RE | Admit: 2023-03-03 | Discharge: 2023-03-04 | Disposition: A | Discharge status: Home / Self Care | Payer: Medicare HMO | Attending: Urology | Admitting: Urology

## 2023-03-03 DIAGNOSIS — I2510 Atherosclerotic heart disease of native coronary artery without angina pectoris: Secondary | ICD-10-CM | POA: Diagnosis not present

## 2023-03-03 DIAGNOSIS — Z79899 Other long term (current) drug therapy: Secondary | ICD-10-CM | POA: Diagnosis not present

## 2023-03-03 DIAGNOSIS — Z951 Presence of aortocoronary bypass graft: Secondary | ICD-10-CM | POA: Insufficient documentation

## 2023-03-03 DIAGNOSIS — I10 Essential (primary) hypertension: Secondary | ICD-10-CM | POA: Insufficient documentation

## 2023-03-03 DIAGNOSIS — I25119 Atherosclerotic heart disease of native coronary artery with unspecified angina pectoris: Secondary | ICD-10-CM | POA: Insufficient documentation

## 2023-03-03 DIAGNOSIS — N201 Calculus of ureter: Principal | ICD-10-CM | POA: Insufficient documentation

## 2023-03-03 DIAGNOSIS — Z87891 Personal history of nicotine dependence: Secondary | ICD-10-CM | POA: Diagnosis not present

## 2023-03-03 DIAGNOSIS — I509 Heart failure, unspecified: Secondary | ICD-10-CM | POA: Diagnosis not present

## 2023-03-03 DIAGNOSIS — I110 Hypertensive heart disease with heart failure: Secondary | ICD-10-CM | POA: Diagnosis not present

## 2023-03-03 HISTORY — PX: CYSTOSCOPY WITH URETEROSCOPY AND STENT PLACEMENT: SHX6377

## 2023-03-03 LAB — GLUCOSE, CAPILLARY: Glucose-Capillary: 95 mg/dL (ref 70–99)

## 2023-03-03 LAB — HEMOGLOBIN AND HEMATOCRIT, BLOOD
HCT: 37.4 % — ABNORMAL LOW (ref 39.0–52.0)
Hemoglobin: 11.8 g/dL — ABNORMAL LOW (ref 13.0–17.0)

## 2023-03-03 SURGERY — CYSTOURETEROSCOPY, WITH STENT INSERTION
Anesthesia: General | Laterality: Left

## 2023-03-03 MED ORDER — METOPROLOL SUCCINATE ER 50 MG PO TB24
50.0000 mg | ORAL_TABLET | Freq: Every day | ORAL | Status: DC
  Administered 2023-03-04: 50 mg via ORAL
  Filled 2023-03-03: qty 1

## 2023-03-03 MED ORDER — FINASTERIDE 5 MG PO TABS
5.0000 mg | ORAL_TABLET | Freq: Every day | ORAL | Status: DC
  Administered 2023-03-03 – 2023-03-04 (×2): 5 mg via ORAL
  Filled 2023-03-03 (×2): qty 1

## 2023-03-03 MED ORDER — DEXAMETHASONE SODIUM PHOSPHATE 10 MG/ML IJ SOLN
INTRAMUSCULAR | Status: AC
  Filled 2023-03-03: qty 1

## 2023-03-03 MED ORDER — 0.9 % SODIUM CHLORIDE (POUR BTL) OPTIME
TOPICAL | Status: DC | PRN
  Administered 2023-03-03: 1000 mL

## 2023-03-03 MED ORDER — IOHEXOL 300 MG/ML  SOLN
INTRAMUSCULAR | Status: DC | PRN
  Administered 2023-03-03: 15 mL

## 2023-03-03 MED ORDER — VITAMIN C 500 MG PO TABS
500.0000 mg | ORAL_TABLET | Freq: Every day | ORAL | Status: DC
  Administered 2023-03-04: 500 mg via ORAL
  Filled 2023-03-03: qty 1

## 2023-03-03 MED ORDER — SODIUM CHLORIDE 0.9 % IV SOLN
1.0000 g | Freq: Once | INTRAVENOUS | Status: AC
  Administered 2023-03-03: 1 g via INTRAVENOUS
  Filled 2023-03-03: qty 1000

## 2023-03-03 MED ORDER — SODIUM CHLORIDE 0.9 % IR SOLN
Status: DC | PRN
  Administered 2023-03-03: 3000 mL

## 2023-03-03 MED ORDER — AMPICILLIN SODIUM 1 G IJ SOLR
1.0000 g | Freq: Four times a day (QID) | INTRAMUSCULAR | Status: DC
  Administered 2023-03-03 – 2023-03-04 (×3): 1 g via INTRAVENOUS
  Filled 2023-03-03 (×6): qty 1000

## 2023-03-03 MED ORDER — KETOROLAC TROMETHAMINE 30 MG/ML IJ SOLN
INTRAMUSCULAR | Status: AC
  Filled 2023-03-03: qty 1

## 2023-03-03 MED ORDER — LIDOCAINE 2% (20 MG/ML) 5 ML SYRINGE
INTRAMUSCULAR | Status: DC | PRN
  Administered 2023-03-03: 100 mg via INTRAVENOUS

## 2023-03-03 MED ORDER — GENTAMICIN SULFATE 40 MG/ML IJ SOLN
5.0000 mg/kg | Freq: Once | INTRAVENOUS | Status: AC
  Administered 2023-03-03: 410 mg via INTRAVENOUS
  Filled 2023-03-03: qty 10.25

## 2023-03-03 MED ORDER — CHLORHEXIDINE GLUCONATE 0.12 % MT SOLN
15.0000 mL | Freq: Once | OROMUCOSAL | Status: AC
  Administered 2023-03-03: 15 mL via OROMUCOSAL

## 2023-03-03 MED ORDER — FENTANYL CITRATE (PF) 100 MCG/2ML IJ SOLN
INTRAMUSCULAR | Status: DC | PRN
  Administered 2023-03-03 (×2): 50 ug via INTRAVENOUS

## 2023-03-03 MED ORDER — SENNA 8.6 MG PO TABS
1.0000 | ORAL_TABLET | Freq: Two times a day (BID) | ORAL | Status: DC
Start: 2023-03-03 — End: 2023-03-05
  Administered 2023-03-03 – 2023-03-04 (×2): 8.6 mg via ORAL
  Filled 2023-03-03 (×2): qty 1

## 2023-03-03 MED ORDER — PROPOFOL 10 MG/ML IV BOLUS
INTRAVENOUS | Status: AC
  Filled 2023-03-03: qty 20

## 2023-03-03 MED ORDER — FENTANYL CITRATE PF 50 MCG/ML IJ SOSY
25.0000 ug | PREFILLED_SYRINGE | INTRAMUSCULAR | Status: DC | PRN
  Administered 2023-03-03: 25 ug via INTRAVENOUS

## 2023-03-03 MED ORDER — MORPHINE SULFATE (PF) 2 MG/ML IV SOLN: 2.0000 mg | INTRAVENOUS | Status: DC | PRN

## 2023-03-03 MED ORDER — PHENYLEPHRINE HCL (PRESSORS) 10 MG/ML IV SOLN
INTRAVENOUS | Status: DC | PRN
  Administered 2023-03-03: 40 ug via INTRAVENOUS
  Administered 2023-03-03: 80 ug via INTRAVENOUS
  Administered 2023-03-03: 120 ug via INTRAVENOUS

## 2023-03-03 MED ORDER — DOXEPIN HCL 25 MG PO CAPS
25.0000 mg | ORAL_CAPSULE | Freq: Every day | ORAL | Status: DC
  Administered 2023-03-03: 25 mg via ORAL
  Filled 2023-03-03 (×2): qty 1

## 2023-03-03 MED ORDER — POLYETHYLENE GLYCOL 3350 17 G PO PACK
17.0000 g | PACK | Freq: Every day | ORAL | Status: DC
  Administered 2023-03-04: 17 g via ORAL
  Filled 2023-03-03: qty 1

## 2023-03-03 MED ORDER — ACETAMINOPHEN 10 MG/ML IV SOLN
INTRAVENOUS | Status: AC
  Filled 2023-03-03: qty 100

## 2023-03-03 MED ORDER — KETOROLAC TROMETHAMINE 30 MG/ML IJ SOLN
INTRAMUSCULAR | Status: DC | PRN
  Administered 2023-03-03: 30 mg via INTRAVENOUS

## 2023-03-03 MED ORDER — ONDANSETRON HCL 4 MG/2ML IJ SOLN: 4.0000 mg | Freq: Once | INTRAMUSCULAR | Status: DC | PRN

## 2023-03-03 MED ORDER — OXYCODONE HCL 5 MG PO TABS: 5.0000 mg | ORAL_TABLET | Freq: Four times a day (QID) | ORAL | Status: DC | PRN

## 2023-03-03 MED ORDER — VITAMIN C 500 MG PO TABS
500.0000 mg | ORAL_TABLET | Freq: Every day | ORAL | Status: DC
Start: 2023-03-03 — End: 2023-03-03

## 2023-03-03 MED ORDER — ROCURONIUM BROMIDE 10 MG/ML (PF) SYRINGE
PREFILLED_SYRINGE | INTRAVENOUS | Status: AC
  Filled 2023-03-03: qty 10

## 2023-03-03 MED ORDER — VITAMIN D 25 MCG (1000 UNIT) PO TABS
5000.0000 [IU] | ORAL_TABLET | Freq: Every day | ORAL | Status: DC
  Administered 2023-03-04: 5000 [IU] via ORAL
  Filled 2023-03-03: qty 5

## 2023-03-03 MED ORDER — ACETAMINOPHEN 325 MG PO TABS
650.0000 mg | ORAL_TABLET | ORAL | Status: DC | PRN
Start: 2023-03-03 — End: 2023-03-05

## 2023-03-03 MED ORDER — ORAL CARE MOUTH RINSE: 15.0000 mL | Freq: Once | OROMUCOSAL | Status: AC

## 2023-03-03 MED ORDER — ROSUVASTATIN CALCIUM 20 MG PO TABS
40.0000 mg | ORAL_TABLET | Freq: Every day | ORAL | Status: DC
  Administered 2023-03-04: 40 mg via ORAL
  Filled 2023-03-03: qty 2

## 2023-03-03 MED ORDER — NITROGLYCERIN 0.4 MG SL SUBL: 0.4000 mg | SUBLINGUAL_TABLET | SUBLINGUAL | Status: DC | PRN

## 2023-03-03 MED ORDER — SUGAMMADEX SODIUM 200 MG/2ML IV SOLN
INTRAVENOUS | Status: DC | PRN
  Administered 2023-03-03: 150 mg via INTRAVENOUS

## 2023-03-03 MED ORDER — ONDANSETRON HCL 4 MG/2ML IJ SOLN: 4.0000 mg | INTRAMUSCULAR | Status: DC | PRN

## 2023-03-03 MED ORDER — DEXAMETHASONE SODIUM PHOSPHATE 10 MG/ML IJ SOLN
INTRAMUSCULAR | Status: DC | PRN
  Administered 2023-03-03: 10 mg via INTRAVENOUS

## 2023-03-03 MED ORDER — LIDOCAINE HCL (PF) 2 % IJ SOLN
INTRAMUSCULAR | Status: AC
  Filled 2023-03-03: qty 5

## 2023-03-03 MED ORDER — PHENYLEPHRINE HCL-NACL 20-0.9 MG/250ML-% IV SOLN
INTRAVENOUS | Status: AC
  Filled 2023-03-03: qty 250

## 2023-03-03 MED ORDER — DOCUSATE SODIUM 100 MG PO CAPS
100.0000 mg | ORAL_CAPSULE | Freq: Two times a day (BID) | ORAL | Status: DC
  Administered 2023-03-03 – 2023-03-04 (×2): 100 mg via ORAL
  Filled 2023-03-03 (×2): qty 1

## 2023-03-03 MED ORDER — LACTATED RINGERS IV SOLN: INTRAVENOUS | Status: DC | PRN

## 2023-03-03 MED ORDER — PHENYLEPHRINE 80 MCG/ML (10ML) SYRINGE FOR IV PUSH (FOR BLOOD PRESSURE SUPPORT)
PREFILLED_SYRINGE | INTRAVENOUS | Status: AC
  Filled 2023-03-03: qty 10

## 2023-03-03 MED ORDER — B COMPLEX PO TABS: 1.0000 | ORAL_TABLET | Freq: Every day | ORAL | Status: DC

## 2023-03-03 MED ORDER — ONDANSETRON HCL 4 MG/2ML IJ SOLN
INTRAMUSCULAR | Status: AC
  Filled 2023-03-03: qty 2

## 2023-03-03 MED ORDER — FENTANYL CITRATE PF 50 MCG/ML IJ SOSY
PREFILLED_SYRINGE | INTRAMUSCULAR | Status: AC
  Administered 2023-03-03: 25 ug via INTRAVENOUS
  Filled 2023-03-03: qty 1

## 2023-03-03 MED ORDER — DEXTROSE 5 % IV SOLN: 240.0000 mg | INTRAVENOUS | Status: DC

## 2023-03-03 MED ORDER — DIPHENHYDRAMINE HCL 50 MG/ML IJ SOLN: 12.5000 mg | Freq: Four times a day (QID) | INTRAMUSCULAR | Status: DC | PRN

## 2023-03-03 MED ORDER — ROCURONIUM BROMIDE 10 MG/ML (PF) SYRINGE
PREFILLED_SYRINGE | INTRAVENOUS | Status: DC | PRN
  Administered 2023-03-03: 60 mg via INTRAVENOUS

## 2023-03-03 MED ORDER — VITAMIN D3 125 MCG (5000 UT) PO CAPS: 5000.0000 [IU] | ORAL_CAPSULE | Freq: Every day | ORAL | Status: DC

## 2023-03-03 MED ORDER — PROPOFOL 10 MG/ML IV BOLUS
INTRAVENOUS | Status: DC | PRN
  Administered 2023-03-03: 150 mg via INTRAVENOUS

## 2023-03-03 MED ORDER — ACETAMINOPHEN 10 MG/ML IV SOLN
1000.0000 mg | Freq: Once | INTRAVENOUS | Status: DC | PRN
  Administered 2023-03-03: 1000 mg via INTRAVENOUS

## 2023-03-03 MED ORDER — FENTANYL CITRATE (PF) 100 MCG/2ML IJ SOLN
INTRAMUSCULAR | Status: AC
  Filled 2023-03-03: qty 2

## 2023-03-03 MED ORDER — DIPHENHYDRAMINE HCL 12.5 MG/5ML PO ELIX: 12.5000 mg | ORAL_SOLUTION | Freq: Four times a day (QID) | ORAL | Status: DC | PRN

## 2023-03-03 MED ORDER — FLUTICASONE FUROATE-VILANTEROL 200-25 MCG/ACT IN AEPB
1.0000 | INHALATION_SPRAY | Freq: Every day | RESPIRATORY_TRACT | Status: DC
  Administered 2023-03-04: 1 via RESPIRATORY_TRACT
  Filled 2023-03-03: qty 28

## 2023-03-03 MED ORDER — B COMPLEX-C PO TABS: 1.0000 | ORAL_TABLET | Freq: Every day | ORAL | Status: DC

## 2023-03-03 MED ORDER — TAMSULOSIN HCL 0.4 MG PO CAPS
0.4000 mg | ORAL_CAPSULE | Freq: Every day | ORAL | Status: DC
Start: 2023-03-04 — End: 2023-03-05
  Administered 2023-03-04: 0.4 mg via ORAL
  Filled 2023-03-03: qty 1

## 2023-03-03 SURGICAL SUPPLY — 59 items
APL SKNCLS STERI-STRIP NONHPOA (GAUZE/BANDAGES/DRESSINGS) ×4
BAG COUNTER SPONGE SURGICOUNT (BAG) IMPLANT
BAG DRN RND TRDRP ANRFLXCHMBR (UROLOGICAL SUPPLIES) ×1
BAG SPNG CNTER NS LX DISP (BAG)
BAG URINE DRAIN 2000ML AR STRL (UROLOGICAL SUPPLIES) ×1 IMPLANT
BASKET ZERO TIP NITINOL 2.4FR (BASKET) IMPLANT
BENZOIN TINCTURE PRP APPL 2/3 (GAUZE/BANDAGES/DRESSINGS) ×4 IMPLANT
BLADE SURG 15 STRL LF DISP TIS (BLADE) IMPLANT
BLADE SURG 15 STRL SS (BLADE)
BSKT STON RTRVL ZERO TP 2.4FR (BASKET) ×1
BULB IRRIG PATHFIND (MISCELLANEOUS) IMPLANT
CATH FOLEY 2W COUNCIL 20FR 5CC (CATHETERS) IMPLANT
CATH ROBINSON RED A/P 20FR (CATHETERS) IMPLANT
CATH ULTRATHANE 14FR (CATHETERS) IMPLANT
CATH URETERAL DUAL LUMEN 10F (MISCELLANEOUS) IMPLANT
CATH URETL OPEN 5X70 (CATHETERS) IMPLANT
CATH UROLOGY TORQUE 40 (MISCELLANEOUS) IMPLANT
CATH X-FORCE N30 NEPHROSTOMY (TUBING) IMPLANT
DRAPE C-ARM 42X120 X-RAY (DRAPES) ×1 IMPLANT
DRAPE LINGEMAN PERC (DRAPES) ×1 IMPLANT
DRAPE STERI IOBAN 125X83 (DRAPES) IMPLANT
DRAPE SURG IRRIG POUCH 19X23 (DRAPES) ×1 IMPLANT
DRSG TEGADERM 8X12 (GAUZE/BANDAGES/DRESSINGS) ×2 IMPLANT
EXTRACTOR STONE NITINOL NGAGE (UROLOGICAL SUPPLIES) IMPLANT
GAUZE PAD ABD 8X10 STRL (GAUZE/BANDAGES/DRESSINGS) ×2 IMPLANT
GAUZE SPONGE 4X4 12PLY STRL (GAUZE/BANDAGES/DRESSINGS) ×1 IMPLANT
GLOVE SURG SS PI 8.0 STRL IVOR (GLOVE) ×1 IMPLANT
GOWN STRL REUS W/ TWL XL LVL3 (GOWN DISPOSABLE) ×1 IMPLANT
GOWN STRL REUS W/TWL XL LVL3 (GOWN DISPOSABLE) ×1
GUIDEWIRE AMPLAZ .035X145 (WIRE) ×2 IMPLANT
GUIDEWIRE ANG ZIPWIRE 038X150 (WIRE) IMPLANT
GUIDEWIRE STR DUAL SENSOR (WIRE) IMPLANT
KIT BASIN OR (CUSTOM PROCEDURE TRAY) ×1 IMPLANT
KIT PROBE 340X3.4XDISP GRN (MISCELLANEOUS) IMPLANT
KIT PROBE TRILOGY 3.4X340 (MISCELLANEOUS)
KIT PROBE TRILOGY 3.9X350 (MISCELLANEOUS) IMPLANT
KIT TURNOVER KIT A (KITS) IMPLANT
LASER FIB FLEXIVA PULSE ID 550 (Laser) IMPLANT
LASER FIB FLEXIVA PULSE ID 910 (Laser) IMPLANT
MANIFOLD NEPTUNE II (INSTRUMENTS) ×1 IMPLANT
NS IRRIG 1000ML POUR BTL (IV SOLUTION) ×1 IMPLANT
PACK BASIC VI WITH GOWN DISP (CUSTOM PROCEDURE TRAY) ×1 IMPLANT
PACK CYSTO (CUSTOM PROCEDURE TRAY) ×1 IMPLANT
PENCIL SMOKE EVACUATOR (MISCELLANEOUS) IMPLANT
SHEATH NAVIGATOR HD 12/14X28 (SHEATH) IMPLANT
SPONGE T-LAP 4X18 ~~LOC~~+RFID (SPONGE) ×1 IMPLANT
STENT URET 6FRX26 CONTOUR (STENTS) IMPLANT
SUT ETHILON 3 0 PS 1 (SUTURE) IMPLANT
SUT SILK 2 0 30 PSL (SUTURE) IMPLANT
SYR 10ML LL (SYRINGE) ×1 IMPLANT
SYR 20ML LL LF (SYRINGE) ×2 IMPLANT
TOWEL OR NON WOVEN STRL DISP B (DISPOSABLE) ×1 IMPLANT
TRACTIP FLEXIVA PULS ID 200XHI (Laser) IMPLANT
TRACTIP FLEXIVA PULSE ID 200 (Laser) ×1
TRAY FOLEY MTR SLVR 16FR STAT (SET/KITS/TRAYS/PACK) IMPLANT
TUBE CONNECTING VINYL 14FR 30C (TUBING) IMPLANT
TUBING CONNECTING 10 (TUBING) ×3 IMPLANT
TUBING STONE CATCHER TRILOGY (MISCELLANEOUS) IMPLANT
TUBING UROLOGY SET (TUBING) ×1 IMPLANT

## 2023-03-03 NOTE — Op Note (Addendum)
Preoperative diagnosis: left ureteral calculus  Postoperative diagnosis: left ureteral calculus  Procedure:  Left antegrade ureteroscopy with laser lithotripsy and basket stone extraction for stone, stone size 7.41mm Left antegrade stent placement Left antegrade pyelogram Left nephrostomy tube replacement  Surgeon: Vilma Prader MD.  Anesthesia: General  Complications: None  Intraoperative findings: Left stone (7.22mm) noted at the proximal ureter severely impacted.  Stone fragmented until all stones to be removed no fragments greater than 2 mm left in the ureter.  Significant J-hook in the distal ureter was able to get scope into the bladder.    6 x 26 double-J stent placed in the left ureter without strings 14 French nephrostomy tube placed in the left ureter.  EBL: Minimal  Specimens: left ureteral calculus  Disposition of specimens: [facility] for stone analysis  Indication: Alex [name] is a 89 y.o.   patient with a left proximal ureteral stone.  Patient was previously attempted to have retrograde ureteroscopy as well as cystolitholapaxy.  During that case unable to get access to the left ureter due to tortuosity of the ureter.  Nephrostomy tube placed he returns today for antegrade ureteroscopy.   Description of procedure:  Patient was taken to the OR placed under general anesthesia by the anesthesia team he was given then given his preoperative antibiotics including ampicillin gentamicin.  Once this was done the patient was transferred to the prone position with appropriate padding under the joints including knees and shoulders so that there was no concern for pressure injury.  The patient was then prepped and draped using the PCL drape.  Time was then performed verifying correct patient procedure next an antegrade nephrostogram was completed demonstrating the nephrostomy tube properly within the collecting system a sensor wire was then placed through the  nephrostomy tube after it was cut and into the collecting system the wire did not go down into the ureter so dual-lumen catheter was then used to place a second wire into the collecting system. A KMP catheter was then advanced over that wire and directed down the ureter once 1 wires down the ureter the dual-lumen was used again to place both wires down the ureter and into the bladder.  Proper placement of wires into the bladder was confirmed with fluoroscopy.  The skin was then cut in the wound the wires was selected for access sheath placement.  A 28 cm 14 French ureteral access sheath was then placed over the wire into the collecting system under fluoroscopy guidance.  Once this was done the ureteroscope was then advanced over through the access sheath into the collecting system the proximal stone was identified 200 m laser was then used to break up the stone using 0.5 J and 5 Hz settings.  After the stone was fragmented all fragments were then basketed out using a encircle basket.  No fragments greater than 2 mm were left within the ureter.  The scope was then driven down to the bladder noting that there was no obstruction from previous concerns of being able to access the ureter retrograde fashion.  Once this was done a second sensor wire was placed the collecting system the scope was then removed.  A 6 x 26 double-J stent was then placed over the wire into the ureter proper placement in the bladder was confirmed with fluoroscopy the wire was then removed along with the ureteral access sheath demonstrating that the stent was in proper position.  Next a nephrostomy tube was then placed over the  remaining wire demonstrating good curl within the collecting system that wire was removed.  Antegrade nephrostogram was then placed through the existing new nephrostomy tube confirming there is no injury to the collecting system.  The tube was then tied into place using a 3-0 nylon suture the patient was then cleaned  and pressure was held on the nephrostomy tube for 2 minutes. A pressure dressing was placed over the nephrostomy tube.  The patient was then extubated and optioned operative suite and taken the PACU in stable condition.  Disposition: Patient will be admitted for observation overnight remain n.p.o. until postoperative H&H results.  Patient will have nephrostomy tube removed in the morning as well as void trial completed.

## 2023-03-03 NOTE — Anesthesia Procedure Notes (Signed)
Procedure Name: Intubation Date/Time: 03/03/2023 1:39 PM  Performed by: Laurita Quint, CRNAPre-anesthesia Checklist: Patient identified, Emergency Drugs available, Suction available and Patient being monitored Patient Re-evaluated:Patient Re-evaluated prior to induction Oxygen Delivery Method: Circle System Utilized Preoxygenation: Pre-oxygenation with 100% oxygen Induction Type: IV induction Ventilation: Mask ventilation without difficulty Laryngoscope Size: Miller and 2 Grade View: Grade I Tube type: Oral Tube size: 7.5 mm Number of attempts: 1 Airway Equipment and Method: Stylet and Oral airway Placement Confirmation: ETT inserted through vocal cords under direct vision, positive ETCO2 and breath sounds checked- equal and bilateral Secured at: 22 cm Tube secured with: Tape Dental Injury: Teeth and Oropharynx as per pre-operative assessment

## 2023-03-03 NOTE — Discharge Instructions (Addendum)
Discharge instructions following Antegrade nephrostomy   Call your doctor for: Fevers greater than 100.5 Severe nausea or vomiting Increasing pain not controlled by pain medication Increasing redness or drainage from incisions Decreased urine output or a catheter is no longer draining  The number for questions is [phone].  Activity: Gradually increase activity with short frequent walks, 3-4 times a day.  Avoid strenuous activities, like sports, lawn-mowing, or heavy lifting (more than 10-15 pounds) for 3 weeks after sugery.  Wear loose, comfortable clothing that pull or kink the tube or tubes.  Do not drive while taking pain medication, or until your doctor permitts it.  Bathing and dressing changes: You should not shower for 48 hours after surgery.  Do not soak your back in a bathtub or submerge the incision in water for 2 weeks.   Incision care:  There is an incision on your back that will heal overtime. Please Change the dressing at least every 24 hours or more often as needed. Some leakage is expected this is normal.   Drainage bag care: You may be discharged with a drainage bag around the site of your surgery.  The drainage bag should be secured such that it never pulls or loosens to prevent it from leaking.  It is important to wash her hands before and after emptying the drainage bag to help prevent the spread of infection.  The drainage bag should be emptied as needed.  When the wound stops draining or it is manageable with a dry gauze dressing, you can remove the bag.  Diet: It is extremely important to drink plenty of fluids after surgery, especially water.  You may resume your regular diet, unless otherwise instructed.  Medications: May take Tylenol (acetaminophen) or ibuprofen (Advil, Motrin) as directed over-the-counter. Please restart your Plavix on Sunday 11/ 3/24. Senna has bee prescribed to help with bowel movents.  Please take home oxycodone as needed for pain.    Follow-up appointments: Follow-up appointment will be scheduled with Derrico Zhong  on 11/12 at 11:15 AM    Foley Catheter Care A soft, flexible tube (Foley catheter) may have been placed in your bladder to drain urine and fluid. Follow these instructions: Taking Care of the Catheter Keep the area where the catheter leaves your body clean.  Attach the catheter to the leg so there is no tension on the catheter.  Keep the drainage bag below the level of the bladder, but keep it OFF the floor.  Do not take long soaking baths. Your caregiver will give instructions about showering.  Wash your hands before touching ANYTHING related to the catheter or bag.  Using mild soap and warm water on a washcloth:  Clean the area closest to the catheter insertion site using a circular motion around the catheter.  Clean the catheter itself by wiping AWAY from the insertion site for several inches down the tube.  NEVER wipe upward as this could sweep bacteria up into the urethra (tube in your body that normally drains the bladder) and cause infection.  Place a small amount of sterile lubricant at the tip of the penis where the catheter is entering.  Taking Care of the Drainage Bags Two drainage bags may be taken home: a large overnight drainage bag, and a smaller leg bag which fits underneath clothing.  It is okay to wear the overnight bag at any time, but NEVER wear the smaller leg bag at night.  Keep the drainage bag well below the level of your bladder. This  prevents backflow of urine into the bladder and allows the urine to drain freely.  Anchor the tubing to your leg to prevent pulling or tension on the catheter. Use tape or a leg strap provided by the hospital.  Empty the drainage bag when it is 1/2 to 3/4 full. Wash your hands before and after touching the bag.  Periodically check the tubing for kinks to make sure there is no pressure on the tubing which could restrict the flow of urine.  Changing the  Drainage Bags Cleanse both ends of the clean bag with alcohol before changing.  Pinch off the rubber catheter to avoid urine spillage during the disconnection.  Disconnect the dirty bag and connect the clean one.  Empty the dirty bag carefully to avoid a urine spill.  Attach the new bag to the leg with tape or a leg strap.  Cleaning the Drainage Bags Whenever a drainage bag is disconnected, it must be cleaned quickly so it is ready for the next use.  Wash the bag in warm, soapy water.  Rinse the bag thoroughly with warm water.  Soak the bag for 30 minutes in a solution of white vinegar and water (1 cup vinegar to 1 quart warm water).  Rinse with warm water.   IT Normal To See Some blood in the urine is normal, as long as you can see your fingers through the catheter tubing (not the bag) this is ok. As long as the urine is not the consistency of tomato paste.  It will be normal to feel the urge to urinate often this is a side effect of the catheter  Some discharge around the catheter where it exits the penis is normal  SEEK MEDICAL CARE IF:  You have chills or night sweats.  You are leaking around your catheter or have problems with your catheter. It is not uncommon to have sporadic leakage around your catheter as a result of bladder spasms. If the leakage stops, there is not much need for concern. If you are uncertain, call your caregiver.  You develop side effects that you think are coming from your medicines.  SEEK IMMEDIATE MEDICAL CARE IF:  You are suddenly unable to urinate. Check to see if there are any kinks in the drainage tubing that may cause this. If you cannot find any kinks, call your caregiver immediately. This is an emergency.  You develop shortness of breath or chest pains.  Bleeding persists or clots develop in your urine.  You have a fever.  You develop pain in your back or over your lower belly (abdomen).  You develop pain or swelling in your legs.  Any problems you are  having get worse rather than better.  MAKE SURE YOU:  Understand these instructions.  Will watch your condition.  Will get help right away if you are not doing well or get worse.

## 2023-03-03 NOTE — Transfer of Care (Signed)
Immediate Anesthesia Transfer of Care Note  Patient: Alex [name]  Procedure(s) Performed: CYSTOSCOPY WITH LEFT ANTEGRADE URETEROSCOPY AND ANTEGRADE STENT PLACEMENT (Left)  Patient Location: PACU  Anesthesia Type:General  Level of Consciousness: drowsy  Airway & Oxygen Therapy: Patient Spontanous Breathing and Patient connected to face mask oxygen  Post-op Assessment: Report given to RN and Post -op Vital signs reviewed and stable  Post vital signs: Reviewed and stable  Last Vitals:  Vitals Value Taken Time  BP 150/79 03/03/23 1524  Temp    Pulse 72 03/03/23 1527  Resp 17 03/03/23 1527  SpO2 100 % 03/03/23 1527  Vitals shown include unfiled device data.  Last Pain:  Vitals:   03/03/23 1104  TempSrc:   PainSc: 0-No pain         Complications: No notable events documented.

## 2023-03-04 ENCOUNTER — Encounter (HOSPITAL_COMMUNITY): Payer: Self-pay | Admitting: Urology

## 2023-03-04 DIAGNOSIS — Z87891 Personal history of nicotine dependence: Secondary | ICD-10-CM | POA: Diagnosis not present

## 2023-03-04 DIAGNOSIS — I25119 Atherosclerotic heart disease of native coronary artery with unspecified angina pectoris: Secondary | ICD-10-CM | POA: Diagnosis not present

## 2023-03-04 DIAGNOSIS — Z79899 Other long term (current) drug therapy: Secondary | ICD-10-CM | POA: Diagnosis not present

## 2023-03-04 DIAGNOSIS — N201 Calculus of ureter: Secondary | ICD-10-CM | POA: Diagnosis not present

## 2023-03-04 DIAGNOSIS — I10 Essential (primary) hypertension: Secondary | ICD-10-CM | POA: Diagnosis not present

## 2023-03-04 DIAGNOSIS — Z951 Presence of aortocoronary bypass graft: Secondary | ICD-10-CM | POA: Diagnosis not present

## 2023-03-04 LAB — CBC WITH DIFFERENTIAL/PLATELET
Abs Immature Granulocytes: 0.03 10*3/uL (ref 0.00–0.07)
Basophils Absolute: 0 10*3/uL (ref 0.0–0.1)
Basophils Relative: 0 %
Eosinophils Absolute: 0 10*3/uL (ref 0.0–0.5)
Eosinophils Relative: 0 %
HCT: 36.7 % — ABNORMAL LOW (ref 39.0–52.0)
Hemoglobin: 11.8 g/dL — ABNORMAL LOW (ref 13.0–17.0)
Immature Granulocytes: 0 %
Lymphocytes Relative: 15 %
Lymphs Abs: 1.5 10*3/uL (ref 0.7–4.0)
MCH: 32.2 pg (ref 26.0–34.0)
MCHC: 32.2 g/dL (ref 30.0–36.0)
MCV: 100.3 fL — ABNORMAL HIGH (ref 80.0–100.0)
Monocytes Absolute: 0.4 10*3/uL (ref 0.1–1.0)
Monocytes Relative: 4 %
Neutro Abs: 8.4 10*3/uL — ABNORMAL HIGH (ref 1.7–7.7)
Neutrophils Relative %: 81 %
Platelets: 248 10*3/uL (ref 150–400)
RBC: 3.66 MIL/uL — ABNORMAL LOW (ref 4.22–5.81)
RDW: 14.5 % (ref 11.5–15.5)
WBC: 10.4 10*3/uL (ref 4.0–10.5)
nRBC: 0 % (ref 0.0–0.2)

## 2023-03-04 LAB — BASIC METABOLIC PANEL
Anion gap: 9 (ref 5–15)
BUN: 17 mg/dL (ref 8–23)
CO2: 23 mmol/L (ref 22–32)
Calcium: 8.5 mg/dL — ABNORMAL LOW (ref 8.9–10.3)
Chloride: 106 mmol/L (ref 98–111)
Creatinine, Ser: 0.81 mg/dL (ref 0.61–1.24)
GFR, Estimated: >60 mL/min (ref >60)
Glucose, Bld: 127 mg/dL — ABNORMAL HIGH (ref 70–99)
Potassium: 4.2 mmol/L (ref 3.5–5.1)
Sodium: 138 mmol/L (ref 135–145)

## 2023-03-04 MED ORDER — SENNA 8.6 MG PO TABS: 1.0000 | ORAL_TABLET | Freq: Every day | ORAL | 0 refills | Status: AC

## 2023-03-04 MED ORDER — ORAL CARE MOUTH RINSE: 15.0000 mL | OROMUCOSAL | Status: DC | PRN

## 2023-03-04 NOTE — Plan of Care (Signed)
  Problem: Education: Goal: Knowledge of General Education information will improve Description: Including pain rating scale, medication(s)/side effects and non-pharmacologic comfort measures Outcome: Progressing   Problem: Safety: Goal: Ability to remain free from injury will improve Outcome: Progressing   Problem: Skin Integrity: Goal: Risk for impaired skin integrity will decrease Outcome: Progressing   Problem: Pain Management: Goal: General experience of comfort will improve Outcome: Progressing   Problem: Elimination: Goal: Will not experience complications related to urinary retention Outcome: Progressing   Problem: Coping: Goal: Level of anxiety will decrease Outcome: Progressing   Problem: Clinical Measurements: Goal: Ability to maintain clinical measurements within normal limits will improve Outcome: Progressing

## 2023-03-04 NOTE — Anesthesia Postprocedure Evaluation (Signed)
Anesthesia Post Note  Patient: Alex [name]  Procedure(s) Performed: CYSTOSCOPY WITH LEFT ANTEGRADE URETEROSCOPY AND ANTEGRADE STENT PLACEMENT (Left)     Patient location during evaluation: PACU Anesthesia Type: General Level of consciousness: awake and sedated Pain management: pain level controlled Vital Signs Assessment: post-procedure vital signs reviewed and stable Respiratory status: spontaneous breathing Cardiovascular status: stable Postop Assessment: no apparent nausea or vomiting Anesthetic complications: no  No notable events documented.  Last Vitals:  Vitals:   03/04/23 0750 03/04/23 0756  BP: (!) 137/58   Pulse: 60   Resp: 16   Temp: 37.1 C   SpO2: 97% 97%    Last Pain:  Vitals:   03/04/23 0750  TempSrc: Oral  PainSc:                  Caren Macadam

## 2023-03-04 NOTE — Discharge Summary (Addendum)
Date of admission: 03/03/2023  Date of discharge: 03/04/2023  Admission diagnosis: Left ureteral stone   Discharge diagnosis: same   Secondary diagnoses:  Patient Active Problem List   Diagnosis Date Noted   Ureteral stone 02/17/2023   Left ureteral stone 02/17/2023   Neuropathy of finger of right hand 05/19/2021   Ischemic cardiomyopathy 06/23/2019   ST elevation myocardial infarction (STEMI) of true posterior wall (HCC) 06/22/2019   Abscessed tooth 09/01/2016   Rhinophyma 06/05/2014   Bilateral knee pain 01/30/2014   Cerumen impaction 01/30/2014   Bilateral lower extremity edema 10/09/2013   Reflux esophagitis 04/10/2013   Left carotid artery stenosis - s/p LCEA 11/09/2012   Hyperlipidemia with target LDL less than 70    Essential hypertension    Dizziness and giddiness    Obesity    Insomnia    Benign prostatic hyperplasia with urinary obstruction    Vitamin D deficiency    Coronary artery disease involving native coronary artery of native heart with angina pectoris (HCC) 03/04/1999   Hx of CABG x4 (LIMA-LAD, RIMA-dRCA, SVG-OM2, SVG-RI) -- OCCLUDED SVG-OM 06/21/1998    Procedures performed: Procedure(s): CYSTOSCOPY WITH LEFT ANTEGRADE URETEROSCOPY AND ANTEGRADE STENT PLACEMENT  History and Physical: For full details, please see admission history and physical. Briefly, Alex [name] is a 89 y.o. year old patient with L ureteral stone requiring L antegrade URS after failed retrograde URS.    Hospital Course: Patient tolerated the procedure well.  He was then transferred to the floor after an uneventful PACU stay.  His hospital course was uncomplicated.  On POD#1 he had met discharge criteria: was eating a regular diet, was up and ambulating independently,  pain was well controlled, was voiding without a catheter, and was ready to for discharge.  He attempted to void trial this AM and gave him all day but was unable to urinate with PVRs > 400 so a 67fr catheter was reinserted  with plans to discuss retention management at next visit.   His neph tube was removed before discharge wihtout complication.   Patient to restart Plavix 11/3.    Laboratory values:  Recent Labs    03/02/23 1439 03/03/23 1551 03/04/23 0511  WBC 9.1  --  10.4  HGB 11.6* 11.8* 11.8*  HCT 36.1* 37.4* 36.7*   Recent Labs    03/04/23 0511  NA 138  K 4.2  CL 106  CO2 23  GLUCOSE 127*  BUN 17  CREATININE 0.81  CALCIUM 8.5*   No results for input(s): "LABPT", "INR" in the last 72 hours. No results for input(s): "LABURIN" in the last 72 hours. Results for orders placed or performed in visit on 12/17/22  Culture, Urine     Status: None   Collection Time: 12/21/22  1:33 PM   Specimen: Urine  Result Value Ref Range Status   MICRO NUMBER: [id number]  Final   SPECIMEN QUALITY: Adequate  Final   Sample Source URINE, CLEAN CATCH  Final   STATUS: FINAL  Final   Result: No Growth  Final    Disposition: Home  Discharge instruction: see PACU DC instructions. Normal PCNL and Catheter instructions.   Restart Plavix 11/3   Discharge medications:  Allergies as of 03/04/2023   No Known Allergies      Medication List     TAKE these medications    Alpha-Lipoic Acid 200 MG Caps Take 200 mg by mouth.   ascorbic acid 500 MG tablet Commonly known as: VITAMIN C Take 500-1,000 mg  by mouth daily.   b complex vitamins tablet Take 1 tablet by mouth daily.   BOSWELLIA PO Take 800 mg by mouth.   Cinnamon 500 MG Tabs Take 500 mg by mouth 3 (three) times a week.   CITRUS BERGAMOT PO Take 1 capsule by mouth 2 (two) times a week.   clopidogrel 75 MG tablet Commonly known as: PLAVIX TAKE 1 TABLET(75 MG) BY MOUTH DAILY What changed: See the new instructions.   Co Q 10 100 MG Caps Take 100 mg by mouth daily.   diazepam 5 MG tablet Commonly known as: VALIUM TAKE 1/2 TABLET(2.5 MG) BY MOUTH TWICE DAILY What changed:  how much to take how to take this when to take this    doxepin 75 MG capsule Commonly known as: SINEQUAN Take 1 capsule (75 mg total) by mouth at bedtime.   finasteride 5 MG tablet Commonly known as: PROSCAR Take 5 mg by mouth daily.   Fluticasone-Salmeterol 250-50 MCG/DOSE Aepb Commonly known as: ADVAIR Inhale 1 puff into the lungs daily as needed.   Grape Seed Oil Take 1 capsule by mouth daily.   metoprolol succinate 50 MG 24 hr tablet Commonly known as: TOPROL-XL Take one tablet by mouth once daily. Take with or immediately following a meal. What changed:  how much to take how to take this when to take this   nitroGLYCERIN 0.4 MG SL tablet Commonly known as: Nitrostat Place 1 tablet (0.4 mg total) under the tongue every 5 (five) minutes as needed. What changed: reasons to take this   oxyCODONE 5 MG immediate release tablet Commonly known as: Roxicodone Take 1 tablet (5 mg total) by mouth every 8 (eight) hours as needed for up to 3 doses. Please use for break through pain if needed   polyethylene glycol 17 g packet Commonly known as: MiraLax Take 17 g by mouth daily.   ramipril 10 MG capsule Commonly known as: ALTACE TAKE 1 CAPSULE BY MOUTH EVERY DAY What changed:  how much to take how to take this when to take this   Resveratrol 100 MG Caps Take 100 mg by mouth daily.   rosuvastatin 40 MG tablet Commonly known as: CRESTOR Take 1 tablet (40 mg total) by mouth daily.   senna 8.6 MG Tabs tablet Commonly known as: SENOKOT Take 1 tablet (8.6 mg total) by mouth daily for 10 doses.   tamsulosin 0.4 MG Caps capsule Commonly known as: FLOMAX Take 1 capsule (0.4 mg total) by mouth daily.   TURMERIC PO Take 1 capsule by mouth as needed.   VITAMIN B-12 PO Take 1 tablet by mouth daily.   Vitamin D3 125 MCG (5000 UT) Caps Take 5,000 Units by mouth daily.               Discharge Care Instructions  (From admission, onward)           Start     Ordered   03/04/23 0000  Change dressing (specify)        Comments: Dressing change: 1x times per day using abd pads or 4x4s. Can be changed more often if dressing becomes saturated.   03/04/23 1703            Followup:   Follow-up Information     Jennette Bill Scherrie Merritts, MD Follow up.   Specialty: Urology Why: you will be called to set up f/u in 1-2 weeks to have the stent removed at [facility] urology Contact information: [address]  [place] [address] [phone]

## 2023-03-04 NOTE — Care Management Obs Status (Signed)
MEDICARE OBSERVATION STATUS NOTIFICATION   Patient Details  Name: Alex [name] MRN: [id number] Date of Birth: 1931-07-28   Medicare Observation Status Notification Given:  Yes    MahabirOlegario Messier, RN 03/04/2023, 12:32 PM

## 2023-03-04 NOTE — Progress Notes (Signed)
Per Provider, discontinue foley catheter after irrigating in of normal saline. Patient was able to tolerate of normal saline before experiencing bladder discomfort. Foley removed without issue. Provider notified.

## 2023-03-04 NOTE — Progress Notes (Signed)
AVS reviewed w/ pt & son. All questions/concerns removed. IV removed  #61F catheter successfully placed per order w/ a 1,15ml output from 1845-1930  Educated on foley care & site care where nephrostomy tube was removed and all supplies provided.

## 2023-03-04 NOTE — TOC Initial Note (Signed)
Transition of Care [facility]) - Initial/Assessment Note    Patient Details  Name: Alex [name] MRN: [id number] Date of Birth: 12/18/1931  Transition of Care [facility]) CM/SW Contact:    Lanier Clam, RN Phone Number: 03/04/2023, 12:33 PM  Clinical Narrative: d/c plan home.Monitor for d/c needs.                  Expected Discharge Plan: Home/Self Care Barriers to Discharge: Continued Medical Work up   Patient Goals and CMS Choice Patient states their goals for this hospitalization and ongoing recovery are:: Home CMS Medicare.gov Compare Post Acute Care list provided to:: Patient Choice offered to / list presented to : Patient      Expected Discharge Plan and Services   Discharge Planning Services: CM Consult   Living arrangements for the past 2 months: Single Family Home                                      Prior Living Arrangements/Services Living arrangements for the past 2 months: Single Family Home Lives with:: Adult Children Patient language and need for interpreter reviewed:: Yes Do you feel safe going back to the place where you live?: Yes      Need for Family Participation in Patient Care: Yes (Comment) Care giver support system in place?: Yes (comment)   Criminal Activity/Legal Involvement Pertinent to Current Situation/Hospitalization: No - Comment as needed  Activities of Daily Living   ADL Screening (condition at time of admission) Independently performs ADLs?: No Does the patient have a NEW difficulty with bathing/dressing/toileting/self-feeding that is expected to last >3 days?: No Does the patient have a NEW difficulty with getting in/out of bed, walking, or climbing stairs that is expected to last >3 days?: No Does the patient have a NEW difficulty with communication that is expected to last >3 days?: No Is the patient deaf or have difficulty hearing?: No Does the patient have difficulty seeing, even when wearing glasses/contacts?: No Does the patient  have difficulty concentrating, remembering, or making decisions?: No  Permission Sought/Granted Permission sought to share information with : Case Manager Permission granted to share information with : Yes, Verbal Permission Granted  Share Information with NAME: Case manager           Emotional Assessment Appearance:: Appears stated age Attitude/Demeanor/Rapport: Gracious Affect (typically observed): Accepting Orientation: : Oriented to Self, Oriented to Place, Oriented to  Time, Oriented to Situation Alcohol / Substance Use: Not Applicable Psych Involvement: No (comment)  Admission diagnosis:  Left ureteral stone [N20.1] Patient Active Problem List   Diagnosis Date Noted   Ureteral stone 02/17/2023   Left ureteral stone 02/17/2023   Neuropathy of finger of right hand 05/19/2021   Ischemic cardiomyopathy 06/23/2019   ST elevation myocardial infarction (STEMI) of true posterior wall (HCC) 06/22/2019   Abscessed tooth 09/01/2016   Rhinophyma 06/05/2014   Bilateral knee pain 01/30/2014   Cerumen impaction 01/30/2014   Bilateral lower extremity edema 10/09/2013   Reflux esophagitis 04/10/2013   Left carotid artery stenosis - s/p LCEA 11/09/2012   Hyperlipidemia with target LDL less than 70    Essential hypertension    Dizziness and giddiness    Obesity    Insomnia    Benign prostatic hyperplasia with urinary obstruction    Vitamin D deficiency    Coronary artery disease involving native coronary artery of native heart with angina pectoris (HCC) 03/04/1999  Hx of CABG x4 (LIMA-LAD, RIMA-dRCA, SVG-OM2, SVG-RI) -- OCCLUDED SVG-OM 06/21/1998   PCP:  Octavia Heir, NP Pharmacy:   [id number] - [place], [place] - [phone] [place] [address] Phone: [phone] Fax: [phone]  [facility] [address] [place] - [address] [facility] [address] [facility] [address] [place]  [address] Phone: [phone] Fax: [phone]     Social Determinants of Health (SDOH) Social History: SDOH Screenings   Food Insecurity: No Food Insecurity (03/03/2023)  Housing: Low Risk  (03/03/2023)  Transportation Needs: No Transportation Needs (03/03/2023)  Utilities: Not At Risk (03/03/2023)  Alcohol Screen: Low Risk  (07/10/2018)  Depression (PHQ2-9): Low Risk  (10/27/2022)  Financial Resource Strain: Low Risk  (09/09/2017)  Physical Activity: Insufficiently Active (09/09/2017)  Social Connections: Moderately Isolated (09/09/2017)  Stress: Stress Concern Present (09/09/2017)  Tobacco Use: Medium Risk (03/03/2023)   SDOH Interventions:     Readmission Risk Interventions     No data to display

## 2023-03-04 NOTE — Progress Notes (Addendum)
1 Day Post-Op Subjective: The patient is doing well.  No nausea or vomiting. Denies pain. Patient has not ambulated.   Objective: Vital signs in last 24 hours: Temp:  [97.5 F (36.4 C)-98.8 F (37.1 C)] 98.5 F (36.9 C) (11/01 0447) Pulse Rate:  [55-76] 71 (11/01 0447) Resp:  [11-21] 18 (11/01 0447) BP: (117-150)/(54-118) 127/71 (11/01 0447) SpO2:  [90 %-100 %] 98 % (11/01 0447) Weight:  [95.9 kg] 95.9 kg (10/31 1104)  Intake/Output from previous day: 10/31 0701 - 11/01 0700 In: 1254.3 [P.O.:594; I.V.:450; IV Piggyback:210.3] Out: 1200 [Urine:1200] Intake/Output this shift: No intake/output data recorded.  Physical Exam:  General: Alert and oriented. CV: RRR Lungs: Clear bilaterally. GI: Soft, Nondistended. Tube site: Dressing-Dry & intact, neph tube draining light pink urine  Urine: 64fr catheter in place clear yellow urine.    Lab Results: Recent Labs    03/02/23 1439 03/03/23 1551 03/04/23 0511  HGB 11.6* 11.8* 11.8*  HCT 36.1* 37.4* 36.7*    Assessment/Plan: POD# 1 s/p L antegrade ureteroscopy. 70 M w/ L ureteral stone s/p procedure above.   Plan: - Void trial now - clamp nephrostomy tube for clamp trial  - if clamp trial goes well will remove neph tube and DC - continue reg diet - continue to hold Plavix  - continue bowel regimen  - labs WNL   Vilma Prader MD.

## 2023-03-14 DIAGNOSIS — N202 Calculus of kidney with calculus of ureter: Secondary | ICD-10-CM | POA: Diagnosis not present

## 2023-03-29 DIAGNOSIS — R3914 Feeling of incomplete bladder emptying: Secondary | ICD-10-CM | POA: Diagnosis not present

## 2023-04-07 ENCOUNTER — Other Ambulatory Visit: Payer: Self-pay

## 2023-04-07 DIAGNOSIS — I10 Essential (primary) hypertension: Secondary | ICD-10-CM

## 2023-04-07 MED ORDER — RAMIPRIL 10 MG PO CAPS: ORAL_CAPSULE | ORAL | 1 refills | Status: DC

## 2023-04-11 DIAGNOSIS — N202 Calculus of kidney with calculus of ureter: Secondary | ICD-10-CM | POA: Diagnosis not present

## 2023-04-11 DIAGNOSIS — R3914 Feeling of incomplete bladder emptying: Secondary | ICD-10-CM | POA: Diagnosis not present

## 2023-04-11 DIAGNOSIS — N400 Enlarged prostate without lower urinary tract symptoms: Secondary | ICD-10-CM | POA: Diagnosis not present

## 2023-04-13 ENCOUNTER — Other Ambulatory Visit: Payer: Self-pay

## 2023-04-13 ENCOUNTER — Ambulatory Visit: Payer: Medicare HMO | Admitting: Sports Medicine

## 2023-04-13 MED ORDER — DOXEPIN HCL 75 MG PO CAPS: 75.0000 mg | ORAL_CAPSULE | Freq: Every day | ORAL | 1 refills | Status: DC

## 2023-04-13 NOTE — Telephone Encounter (Signed)
Critical dose warning populated when attempting to refill. Will send to Octavia Heir, NP to review

## 2023-04-20 ENCOUNTER — Ambulatory Visit: Payer: Medicare HMO | Admitting: Family Medicine

## 2023-04-21 ENCOUNTER — Ambulatory Visit (INDEPENDENT_AMBULATORY_CARE_PROVIDER_SITE_OTHER): Payer: Medicare HMO | Admitting: Orthopedic Surgery

## 2023-04-21 ENCOUNTER — Encounter: Payer: Self-pay | Admitting: Orthopedic Surgery

## 2023-04-21 VITALS — BP 134/74 | HR 80 | Temp 96.7°F | Resp 17 | Ht 70.0 in | Wt 214.8 lb

## 2023-04-21 DIAGNOSIS — N211 Calculus in urethra: Secondary | ICD-10-CM

## 2023-04-21 DIAGNOSIS — R339 Retention of urine, unspecified: Secondary | ICD-10-CM

## 2023-04-21 DIAGNOSIS — N200 Calculus of kidney: Secondary | ICD-10-CM

## 2023-04-21 DIAGNOSIS — E785 Hyperlipidemia, unspecified: Secondary | ICD-10-CM | POA: Diagnosis not present

## 2023-04-21 DIAGNOSIS — R351 Nocturia: Secondary | ICD-10-CM | POA: Diagnosis not present

## 2023-04-21 DIAGNOSIS — I10 Essential (primary) hypertension: Secondary | ICD-10-CM | POA: Diagnosis not present

## 2023-04-21 NOTE — Progress Notes (Signed)
Careteam: Patient Care Team: Octavia Heir, NP as PCP - General (Adult Health Nurse Practitioner) Marykay Lex, MD as PCP - Cardiology (Cardiology) Runell Gess, MD as Consulting Physician (Cardiology) Nada Libman, MD as Consulting Physician (Vascular Surgery) Mckinley Jewel, MD as Consulting Physician (Ophthalmology) Alfredo Martinez, MD as Consulting Physician (Urology) Mckinley Jewel, MD as Consulting Physician (Ophthalmology)  Seen by: Hazle Nordmann, AGNP-C  PLACE OF SERVICE:  [facility]  Advanced Directive information    No Known Allergies  Chief Complaint  Patient presents with   Medical Management of Chronic Issues    4 month follow up.    Immunizations    Discuss the need for [employer].      HPI: Patient is a 89 y.o. male seen today for medical management of chronic conditions.   No health concerns.   Followed by urology due to ongoing left urethral stone. S/p ureteroscopy 10/31. Briefly had indwelling catheter. 12/09 PVR showed he is emptying 1/2 of bladder. He admits to leaking throughout day. Denies hematuria. He stopped taking Flomax. Last urology note lists medication. Remains on finasteride.   Weight down 9 lbs. Eating 2 meals daily.   No falls or injuries.   He lives with son and doing well. Still driving. Denies getting lost and accidents.   BP 134/74, remains on ramipril and metoprolol.   UTD on vaccinations.   Review of Systems:  Review of Systems  Constitutional: Negative.   HENT:  Positive for hearing loss.   Eyes: Negative.   Respiratory: Negative.    Cardiovascular: Negative.   Gastrointestinal: Negative.   Genitourinary:  Positive for frequency. Negative for dysuria, flank pain, hematuria and urgency.  Musculoskeletal:  Negative for joint pain.  Skin: Negative.   Neurological: Negative.   Psychiatric/Behavioral: Negative.      Past Medical History:  Diagnosis Date   Acquired deformity of nose    Anemia     Anxiety    Arthritis    Benign localized prostatic hyperplasia with lower urinary tract symptoms (LUTS)    Bilateral lower extremity edema    chronic   Bladder calculus    Cervicalgia    CHF (congestive heart failure) (HCC)    Ischemic CM   Chronic heart failure with preserved ejection fraction (HFpEF) (HCC)    followed by cardiology---- per last echo in epic   20-20-2021  EF ~45-50%   Complication of anesthesia    history post op urinary retension 02/ 2016 post op Left CEA   Emphysema lung (HCC)    per pt has advair prn   Essential hypertension    GERD (gastroesophageal reflux disease)    History of esophagitis    History of kidney stones    History of nonmelanoma skin cancer    History of ST elevation myocardial infarction (STEMI) 06/22/2019   True posterior STEMI -> occluded SVG.  Native LM--LCX PCI w/ DES   Hyperlipidemia    Insomnia, unspecified    Internal hemorrhoids without mention of complication    Ischemic cardiomyopathy    last echo in epic 06-23-2019  ef 45-50%   Left carotid artery stenosis    S/P L CEA - 2015 (Dr. Myra Gianotti)   Left ureteral calculus    Multiple vessel coronary artery disease 2000   cardiologist--- dr Herbie [place] ;   cath 2000 ~50% LM-ostLAD with 85% ost-mid LCx, 80% ost RI, 100% CTO RCA -> CABG X 4 (LIMA-LAD, RIMA-dRCA, SVG-OM2, SVG-RI) (Dr. Dorris Fetch) => 06/2019:  posterior STEMI : 100% SVG-OM2 (unable to open), 100% OM2; DES PCI LM-mLCx.   Myocardial infarction [facility])    STEMI 2021    DES x 1   Pre-diabetes    S/P drug eluting coronary stent placement 06/22/2019   PCI and DES x1  to LM -- mLCx   Wears glasses    Past Surgical History:  Procedure Laterality Date   COLONOSCOPY WITH ESOPHAGOGASTRODUODENOSCOPY (EGD)  2007   Dr Virginia Rochester   CORONARY ARTERY BYPASS GRAFT  09/11/1998   @ [facility] by Dr. Kathie Rhodes.  [place] - CABG x 4:  LIMA-LAD, RIMA-dRCA, SVG-OM2, SVG-RI   CORONARY STENT INTERVENTION N/A 06/22/2019   Procedure: CORONARY STENT INTERVENTION;   Surgeon: Marykay Lex, MD;  Location: [facility] INVASIVE CV LAB; unsuccessful attempt at revascularization of SVG-OM2 => Synergy DES PCI from LM-mid LCx (Synergy DES 2.5 mm x 38 mm--> 3.1-2.6 mm); unfortunately OM2 was occluded.  This revascularized OM1, OM 3 and OM 4.   CORONARY/GRAFT ACUTE MI REVASCULARIZATION N/A 06/22/2019   Procedure: CORONARY/GRAFT ACUTE MI REVASCULARIZATION;  Surgeon: Marykay Lex, MD;  Location: [facility] INVASIVE CV LAB;; unsuccessful PTCA/aspiration thrombectomy of SVG-OM1 as culprit lesion for STEMI   CYSTOSCOPY WITH LITHOLAPAXY N/A 02/17/2023   Procedure: CYSTOSCOPY WITH LITHOLAPAXY;  Surgeon: Adonis Brook, MD;  Location: [facility];  Service: Urology;  Laterality: N/A;  60 MINUTES   CYSTOSCOPY WITH URETEROSCOPY AND STENT PLACEMENT Left 03/03/2023   Procedure: CYSTOSCOPY WITH LEFT ANTEGRADE URETEROSCOPY AND ANTEGRADE STENT PLACEMENT;  Surgeon: Adonis Brook, MD;  Location: [facility];  Service: Urology;  Laterality: Left;  90 MINS   CYSTOSCOPY/URETEROSCOPY/HOLMIUM LASER/STENT PLACEMENT Left 02/17/2023   Procedure: CYSTOSCOPY LEFT URETEROSCOPY, ATTEMPTED;  Surgeon: Adonis Brook, MD;  Location: [facility];  Service: Urology;  Laterality: Left;   ENDARTERECTOMY Left 06/14/2014   Procedure: LEFT CAROTID ENDARTERECTOMY WITH VASCUGUARD PATCH ANGIOPLASTY;  Surgeon: Nada Libman, MD;  Location: [facility];  Service: Vascular;  Laterality: Left;   EXCISIONAL HEMORRHOIDECTOMY  1981   EXTRACORPOREAL SHOCK WAVE LITHOTRIPSY  2005   INCISION AND DRAINAGE Right 06/06/2016   @[facility]    right mandible area for abscess   IR NEPHROSTOMY PLACEMENT LEFT  02/17/2023   LEFT HEART CATH AND CORONARY ANGIOGRAPHY  2000   MV CAD --> CABG x 4   LEFT HEART CATH AND CORS/GRAFTS ANGIOGRAPHY N/A 06/22/2019   Procedure: LEFT HEART CATH AND CORS/GRAFTS ANGIOGRAPHY;  Surgeon: Marykay Lex, MD;  CULPRIT LESION = 100% SVG-OM2; Severe native CAD: mLM-Ost LAD-50%  w/ Ost LCx 85% (DES PCI OM-OstLCx)- Treated b/c 100% TO SVG-OM2 as Culprit lesion - unable to open. OM2 100%. Ost RI 80%, Ost-mid LAD 50% - 100% mLAD after D1; ost RI 80%, mid RCA 100%; Patent SVG-RI, pedLIMA-dLAD, pedRIMA-dRCA; EF 45-50%   MULTIPLE TOOTH EXTRACTIONS  06/01/2016   @[facility]     due to abscesses   TONSILLECTOMY  1940   TRANSTHORACIC ECHOCARDIOGRAM  06/23/2019   Inferolateral STEMI (occluded SVG-OM2): (Definity) mildly reduced LV function-EF 45 to 50%.  GR 1 DD.  No obvious R WMA.  Essentially normal valves.  Mild MR.   Social History:   reports that he quit smoking about 50 years ago. His smoking use included cigarettes. He started smoking about 75 years ago. He has a 50 pack-year smoking history. He has never been exposed to tobacco smoke. He has never used smokeless tobacco. He reports current alcohol use of about 1.0 - 2.0 standard drink of alcohol per week.  He reports that he does not use drugs.  Family History  Problem Relation Age of Onset   Cancer Mother        LIVER   Cancer Brother        PROSTATE   Cancer Daughter     Medications: Patient's Medications  New Prescriptions   No medications on file  Previous Medications   ALPHA-LIPOIC ACID 200 MG CAPS    Take 200 mg by mouth.   ASCORBIC ACID (VITAMIN C) 500 MG TABLET    Take 500-1,000 mg by mouth daily.   B COMPLEX VITAMINS TABLET    Take 1 tablet by mouth daily.   BOSWELLIA SERRATA (BOSWELLIA PO)    Take 800 mg by mouth.   CHOLECALCIFEROL (VITAMIN D3) 125 MCG (5000 UT) CAPS    Take 5,000 Units by mouth daily.   CINNAMON 500 MG TABS    Take 500 mg by mouth 3 (three) times a week.    CITRUS BERGAMOT PO    Take 1 capsule by mouth 2 (two) times a week.   CLOPIDOGREL (PLAVIX) 75 MG TABLET    TAKE 1 TABLET(75 MG) BY MOUTH DAILY   COENZYME Q10 (CO Q 10) 100 MG CAPS    Take 100 mg by mouth daily.    CYANOCOBALAMIN (VITAMIN B-12 PO)    Take 1 tablet by mouth daily.   DIAZEPAM (VALIUM) 5 MG TABLET    Take 2.5 mg by mouth  at bedtime.   DOXEPIN (SINEQUAN) 75 MG CAPSULE    Take 1 capsule (75 mg total) by mouth at bedtime.   FINASTERIDE (PROSCAR) 5 MG TABLET    Take 5 mg by mouth daily.   FLUTICASONE-SALMETEROL (ADVAIR) 250-50 MCG/DOSE AEPB    Inhale 1 puff into the lungs daily as needed.   GRAPE SEED OIL    Take 1 capsule by mouth daily.   METOPROLOL SUCCINATE (TOPROL-XL) 50 MG 24 HR TABLET    Take one tablet by mouth once daily. Take with or immediately following a meal.   NITROGLYCERIN (NITROSTAT) 0.4 MG SL TABLET    Place 1 tablet (0.4 mg total) under the tongue every 5 (five) minutes as needed.   POLYETHYLENE GLYCOL (MIRALAX) 17 G PACKET    Take 17 g by mouth daily.   RAMIPRIL (ALTACE) 10 MG CAPSULE    TAKE 1 CAPSULE BY MOUTH EVERY DAY   RESVERATROL 100 MG CAPS    Take 100 mg by mouth daily.    ROSUVASTATIN (CRESTOR) 40 MG TABLET    Take 1 tablet (40 mg total) by mouth daily.   TURMERIC PO    Take 1 capsule by mouth as needed.   Modified Medications   No medications on file  Discontinued Medications   DIAZEPAM (VALIUM) 5 MG TABLET    TAKE 1/2 TABLET(2.5 MG) BY MOUTH TWICE DAILY   OXYCODONE (ROXICODONE) 5 MG IMMEDIATE RELEASE TABLET    Take 1 tablet (5 mg total) by mouth every 8 (eight) hours as needed for up to 3 doses. Please use for break through pain if needed   TAMSULOSIN (FLOMAX) 0.4 MG CAPS CAPSULE    Take 1 capsule (0.4 mg total) by mouth daily.    Physical Exam:  Vitals:   04/21/23 1307  BP: 134/74  Pulse: 80  Resp: 17  Temp: (!) 96.7 F (35.9 C)  SpO2: 94%  Weight: 214 lb 12.8 oz (97.4 kg)  Height: 5\' 10"  (1.778 m)   Body mass index is 30.82 kg/m.  Wt Readings from Last 3 Encounters:  04/21/23 214 lb 12.8 oz (97.4 kg)  03/03/23 211 lb 6.7 oz (95.9 kg)  03/02/23 211 lb 6.7 oz (95.9 kg)    Physical Exam Vitals reviewed.  Constitutional:      General: He is not in acute distress. HENT:     Head: Normocephalic.  Eyes:     General:        Right eye: No discharge.        Left eye:  No discharge.  Cardiovascular:     Rate and Rhythm: Normal rate and regular rhythm.     Pulses: Normal pulses.     Heart sounds: Normal heart sounds.  Pulmonary:     Effort: Pulmonary effort is normal. No respiratory distress.     Breath sounds: Normal breath sounds. No wheezing.  Abdominal:     General: Bowel sounds are normal.     Palpations: Abdomen is soft.  Musculoskeletal:     Cervical back: Neck supple.     Right lower leg: No edema.     Left lower leg: No edema.  Skin:    General: Skin is warm.     Capillary Refill: Capillary refill takes less than 2 seconds.  Neurological:     General: No focal deficit present.     Mental Status: He is alert and oriented to person, place, and time.     Motor: No weakness.     Gait: Gait normal.  Psychiatric:        Mood and Affect: Mood normal.    Labs reviewed: Basic Metabolic Panel: Recent Labs    12/13/22 1442 02/17/23 0912 02/18/23 0513 03/04/23 0511  NA 137 143 134* 138  K 4.3 3.7 3.6 4.2  CL 102 106 104 106  CO2 25  --  22 23  GLUCOSE 97 99 123* 127*  BUN 21 15 18 17   CREATININE 1.21 1.20 0.99 0.81  CALCIUM 8.9  --  8.2* 8.5*   Liver Function Tests: Recent Labs    10/27/22 1401 11/23/22 1400 12/13/22 1442  AST 17 15 15   ALT 14 11 11   BILITOT 0.5 0.6 0.8  PROT 6.5 6.9 6.7   No results for input(s): "LIPASE", "AMYLASE" in the last 8760 hours. No results for input(s): "AMMONIA" in the last 8760 hours. CBC: Recent Labs    11/23/22 1400 12/13/22 1442 02/17/23 0912 02/18/23 0513 03/02/23 1439 03/03/23 1551 03/04/23 0511  WBC 16.8* 10.4  --  10.9* 9.1  --  10.4  NEUTROABS 14,028* 7,426  --   --   --   --  8.4*  HGB 12.8* 12.7*   < > 10.7* 11.6* 11.8* 11.8*  HCT 37.5* 38.2*   < > 33.9* 36.1* 37.4* 36.7*  MCV 95.9 97.0  --  102.4* 100.6*  --  100.3*  PLT 233 266  --  237 272  --  248   < > = values in this interval not displayed.   Lipid Panel: Recent Labs    10/27/22 1401  CHOL 126  HDL 39*   LDLCALC 66  TRIG 129  CHOLHDL 3.2   TSH: No results for input(s): "TSH" in the last 8760 hours. A1C: Lab Results  Component Value Date   HGBA1C 5.8 (H) 04/14/2022     Assessment/Plan: 1. Urethral stone (Left) - followed by [facility] - s/p ureteroscopy 03/03/2023 - had sten and catheter for brief period after procedure - increased PVR after cath removal  - he  is voiding on his own - no complaints today - not taking flomax> 12/09 urology appointment reports to take it - will restart flomax   2. Urinary retention - see above - cont finasteride  3. Essential hypertension - BUN/creat 10/0.92 04/21/2023 - cont ramipril and metoprolol - Basic Metabolic Panel with eGFR - CBC with Differential/Platelet  4. Hyperlipidemia with target LDL less than 70 - total 126, LDL 66 10/27/2022 - cont rosuvastatin  Total time: 31 minutes. Greater than 50% of total time spent doing patient education regarding health maintenance, urethral stone, HTN and cholesterol including symptom/medication management.   Next appt: 05/05/2023  Hazle Nordmann, Juel Burrow  [facility] [phone]

## 2023-04-21 NOTE — Patient Instructions (Addendum)
Please put vaseline on skin after showering   Schedule medicare annual wellness visit

## 2023-04-22 ENCOUNTER — Other Ambulatory Visit: Payer: Self-pay

## 2023-04-22 LAB — CBC WITH DIFFERENTIAL/PLATELET
Absolute Lymphocytes: 1888 {cells}/uL (ref 850–3900)
Absolute Monocytes: 378 {cells}/uL (ref 200–950)
Basophils Absolute: 38 {cells}/uL (ref 0–200)
Basophils Relative: 0.6 %
Eosinophils Absolute: 378 {cells}/uL (ref 15–500)
Eosinophils Relative: 5.9 %
HCT: 37.6 % — ABNORMAL LOW (ref 38.5–50.0)
Hemoglobin: 12.5 g/dL — ABNORMAL LOW (ref 13.2–17.1)
MCH: 32.3 pg (ref 27.0–33.0)
MCHC: 33.2 g/dL (ref 32.0–36.0)
MCV: 97.2 fL (ref 80.0–100.0)
MPV: 9.8 fL (ref 7.5–12.5)
Monocytes Relative: 5.9 %
Neutro Abs: 3718 {cells}/uL (ref 1500–7800)
Neutrophils Relative %: 58.1 %
Platelets: 216 10*3/uL (ref 140–400)
RBC: 3.87 10*6/uL — ABNORMAL LOW (ref 4.20–5.80)
RDW: 12.6 % (ref 11.0–15.0)
Total Lymphocyte: 29.5 %
WBC: 6.4 10*3/uL (ref 3.8–10.8)

## 2023-04-22 LAB — BASIC METABOLIC PANEL WITH GFR
BUN: 10 mg/dL (ref 7–25)
CO2: 28 mmol/L (ref 20–32)
Calcium: 9.1 mg/dL (ref 8.6–10.3)
Chloride: 104 mmol/L (ref 98–110)
Creat: 0.92 mg/dL (ref 0.70–1.22)
Glucose, Bld: 125 mg/dL (ref 65–139)
Potassium: 3.9 mmol/L (ref 3.5–5.3)
Sodium: 139 mmol/L (ref 135–146)
eGFR: 80 mL/min/{1.73_m2} (ref >=60)

## 2023-04-22 MED ORDER — DIAZEPAM 5 MG PO TABS: 2.5000 mg | ORAL_TABLET | Freq: Every day | ORAL | 0 refills | Status: DC

## 2023-04-22 MED ORDER — TAMSULOSIN HCL 0.4 MG PO CAPS: 0.4000 mg | ORAL_CAPSULE | Freq: Every day | ORAL | 3 refills | Status: DC

## 2023-04-22 NOTE — Telephone Encounter (Signed)
Patient has request refill from pharmacy for medication. Medication pend and sent to PCP Octavia Heir, NP

## 2023-05-05 ENCOUNTER — Encounter: Payer: Medicare HMO | Admitting: Orthopedic Surgery

## 2023-05-06 NOTE — Progress Notes (Signed)
 This encounter was created in error - please disregard.

## 2023-05-12 ENCOUNTER — Ambulatory Visit (INDEPENDENT_AMBULATORY_CARE_PROVIDER_SITE_OTHER): Payer: PPO | Admitting: Orthopedic Surgery

## 2023-05-12 ENCOUNTER — Encounter: Payer: Self-pay | Admitting: Orthopedic Surgery

## 2023-05-12 VITALS — BP 130/76 | HR 81 | Temp 97.3°F | Resp 17 | Ht 70.0 in | Wt 214.8 lb

## 2023-05-12 DIAGNOSIS — Z0000 Encounter for general adult medical examination without abnormal findings: Secondary | ICD-10-CM

## 2023-05-12 NOTE — Patient Instructions (Signed)
 Schedule yearly eye exam> you need exam once yearly   Alex [name] , Thank you for taking time to come for your Medicare Wellness Visit. I appreciate your ongoing commitment to your health goals. Please review the following plan we discussed and let me know if I can assist you in the future.   These are the goals we discussed:  Goals      Increase water  intake     Starting 08/30/2016 I will start drinking 3 glasses of water  a day.        This is a list of the screening recommended for you and due dates:  Health Maintenance  Topic Date Due   COVID-19 Vaccine (9 - 2024-25 season) 05/28/2023*   Pneumonia Vaccine (2 of 2 - PPSV23 or PCV20) 10/27/2023*   Medicare Annual Wellness Visit  05/11/2024   DTaP/Tdap/Td vaccine (3 - Td or Tdap) 10/30/2029   Flu Shot  Completed   HPV Vaccine  Aged Out   Zoster (Shingles) Vaccine  Discontinued  *Topic was postponed. The date shown is not the original due date.

## 2023-05-12 NOTE — Progress Notes (Signed)
 Subjective:   Alex [name] is a 89 y.o. male who presents for Medicare Annual/Subsequent preventive examination.  Visit Complete: In person  Patient Medicare AWV questionnaire was completed by the patient on 05/12/2023; I have confirmed that all information answered by patient is correct and no changes since this date.  Cardiac Risk Factors include: advanced age (>25men, >4 women);male gender;hypertension;obesity (BMI >30kg/m2);sedentary lifestyle;dyslipidemia     Objective:    Today's Vitals   05/12/23 1329  BP: 130/76  Pulse: 81  Resp: 17  Temp: (!) 97.3 F (36.3 C)  SpO2: 97%  Weight: 214 lb 12.8 oz (97.4 kg)  Height: 5' 10 (1.778 m)   Body mass index is 30.82 kg/m.     05/12/2023    1:29 PM 04/21/2023    1:14 PM 03/03/2023   10:58 AM 03/02/2023    2:22 PM 02/17/2023    8:15 PM 02/17/2023    8:44 AM 12/23/2022    2:18 PM  Advanced Directives  Does Patient Have a Medical Advance Directive? Yes Yes Yes Yes Yes Yes No  Type of Estate Agent of [place];Living will;Out of facility DNR (pink MOST or yellow form) Healthcare Power of [place];Out of facility DNR (pink MOST or yellow form);Living will Healthcare Power of [place];Living will Healthcare Power of [place];Living will Living will Living will   Does patient want to make changes to medical advance directive? No - Patient declined No - Patient declined No - Patient declined No - Patient declined No - Patient declined    Copy of Healthcare Power of Attorney in Chart? No - copy requested No - copy requested No - copy requested      Would patient like information on creating a medical advance directive?       No - Patient declined    Current Medications (verified) Outpatient Encounter Medications as of 05/12/2023  Medication Sig   Alpha-Lipoic Acid 200 MG CAPS Take 200 mg by mouth.   ascorbic acid  (VITAMIN C ) 500 MG tablet Take 500-1,000 mg by mouth daily.   b complex vitamins tablet Take 1  tablet by mouth daily.   Boswellia Serrata (BOSWELLIA PO) Take 800 mg by mouth.   Cholecalciferol  (VITAMIN D3) 125 MCG (5000 UT) CAPS Take 5,000 Units by mouth daily.   Cinnamon 500 MG TABS Take 500 mg by mouth 3 (three) times a week.    CITRUS BERGAMOT PO Take 1 capsule by mouth 2 (two) times a week.   clopidogrel  (PLAVIX ) 75 MG tablet TAKE 1 TABLET(75 MG) BY MOUTH DAILY   Coenzyme Q10 (CO Q 10) 100 MG CAPS Take 100 mg by mouth daily.    Cyanocobalamin (VITAMIN B-12 PO) Take 1 tablet by mouth daily.   diazepam  (VALIUM ) 5 MG tablet Take 0.5 tablets (2.5 mg total) by mouth at bedtime.   doxepin  (SINEQUAN ) 75 MG capsule Take 1 capsule (75 mg total) by mouth at bedtime.   finasteride  (PROSCAR ) 5 MG tablet Take 5 mg by mouth daily.   Fluticasone -Salmeterol (ADVAIR) 250-50 MCG/DOSE AEPB Inhale 1 puff into the lungs daily as needed.   Grape Seed OIL Take 1 capsule by mouth daily.   metoprolol  succinate (TOPROL -XL) 50 MG 24 hr tablet Take one tablet by mouth once daily. Take with or immediately following a meal.   nitroGLYCERIN  (NITROSTAT ) 0.4 MG SL tablet Place 1 tablet (0.4 mg total) under the tongue every 5 (five) minutes as needed.   polyethylene glycol (MIRALAX ) 17 g packet Take 17 g by  mouth daily.   ramipril  (ALTACE ) 10 MG capsule TAKE 1 CAPSULE BY MOUTH EVERY DAY   RESVERATROL 100 MG CAPS Take 100 mg by mouth daily.    rosuvastatin  (CRESTOR ) 40 MG tablet Take 1 tablet (40 mg total) by mouth daily.   tamsulosin  (FLOMAX ) 0.4 MG CAPS capsule Take 1 capsule (0.4 mg total) by mouth daily.   TURMERIC PO Take 1 capsule by mouth as needed.    No facility-administered encounter medications on file as of 05/12/2023.    Allergies (verified) Patient has no known allergies.   History: Past Medical History:  Diagnosis Date   Acquired deformity of nose    Anemia    Anxiety    Arthritis    Benign localized prostatic hyperplasia with lower urinary tract symptoms (LUTS)    Bilateral lower extremity  edema    chronic   Bladder calculus    Cervicalgia    CHF (congestive heart failure) (HCC)    Ischemic CM   Chronic heart failure with preserved ejection fraction (HFpEF) (HCC)    followed by cardiology---- per last echo in epic   20-20-2021  EF ~45-50%   Complication of anesthesia    history post op urinary retension 02/ 2016 post op Left CEA   Emphysema lung (HCC)    per pt has advair prn   Essential hypertension    GERD (gastroesophageal reflux disease)    History of esophagitis    History of kidney stones    History of nonmelanoma skin cancer    History of ST elevation myocardial infarction (STEMI) 06/22/2019   True posterior STEMI -> occluded SVG.  Native LM--LCX PCI w/ DES   Hyperlipidemia    Insomnia, unspecified    Internal hemorrhoids without mention of complication    Ischemic cardiomyopathy    last echo in epic 06-23-2019  ef 45-50%   Left carotid artery stenosis    S/P L CEA - 2015 (Dr. Serene)   Left ureteral calculus    Multiple vessel coronary artery disease 2000   cardiologist--- dr anner ;   cath 2000 ~50% LM-ostLAD with 85% ost-mid LCx, 80% ost RI, 100% CTO RCA -> CABG X 4 (LIMA-LAD, RIMA-dRCA, SVG-OM2, SVG-RI) (Dr. Kerrin) => 06/2019:  posterior STEMI : 100% SVG-OM2 (unable to open), 100% OM2; DES PCI LM-mLCx.   Myocardial infarction [facility])    STEMI 2021    DES x 1   Pre-diabetes    S/P drug eluting coronary stent placement 06/22/2019   PCI and DES x1  to LM -- mLCx   Wears glasses    Past Surgical History:  Procedure Laterality Date   COLONOSCOPY WITH ESOPHAGOGASTRODUODENOSCOPY (EGD)  2007   Dr Geroge   CORONARY ARTERY BYPASS GRAFT  09/11/1998   @ [facility] by Dr. GORMAN.  H[place]rickson - CABG x 4:  LIMA-LAD, RIMA-dRCA, SVG-OM2, SVG-RI   CORONARY STENT INTERVENTION N/A 06/22/2019   Procedure: CORONARY STENT INTERVENTION;  Surgeon: Anner Alm ORN, MD;  Location: [facility] INVASIVE CV LAB; unsuccessful attempt at revascularization of SVG-OM2 => Synergy DES PCI from  LM-mid LCx (Synergy DES 2.5 mm x 38 mm--> 3.1-2.6 mm); unfortunately OM2 was occluded.  This revascularized OM1, OM 3 and OM 4.   CORONARY/GRAFT ACUTE MI REVASCULARIZATION N/A 06/22/2019   Procedure: CORONARY/GRAFT ACUTE MI REVASCULARIZATION;  Surgeon: Anner Alm ORN, MD;  Location: [facility] INVASIVE CV LAB;; unsuccessful PTCA/aspiration thrombectomy of SVG-OM1 as culprit lesion for STEMI   CYSTOSCOPY WITH LITHOLAPAXY N/A 02/17/2023   Procedure: CYSTOSCOPY WITH LITHOLAPAXY;  Surgeon: Shane Steffan BROCKS, MD;  Location: [facility];  Service: Urology;  Laterality: N/A;  60 MINUTES   CYSTOSCOPY WITH URETEROSCOPY AND STENT PLACEMENT Left 03/03/2023   Procedure: CYSTOSCOPY WITH LEFT ANTEGRADE URETEROSCOPY AND ANTEGRADE STENT PLACEMENT;  Surgeon: Shane Steffan BROCKS, MD;  Location: [facility];  Service: Urology;  Laterality: Left;  90 MINS   CYSTOSCOPY/URETEROSCOPY/HOLMIUM LASER/STENT PLACEMENT Left 02/17/2023   Procedure: CYSTOSCOPY LEFT URETEROSCOPY, ATTEMPTED;  Surgeon: Shane Steffan BROCKS, MD;  Location: [facility];  Service: Urology;  Laterality: Left;   ENDARTERECTOMY Left 06/14/2014   Procedure: LEFT CAROTID ENDARTERECTOMY WITH VASCUGUARD PATCH ANGIOPLASTY;  Surgeon: Gaile LELON New, MD;  Location: [facility];  Service: Vascular;  Laterality: Left;   EXCISIONAL HEMORRHOIDECTOMY  1981   EXTRACORPOREAL SHOCK WAVE LITHOTRIPSY  2005   INCISION AND DRAINAGE Right 06/06/2016   @[facility]    right mandible area for abscess   IR NEPHROSTOMY PLACEMENT LEFT  02/17/2023   LEFT HEART CATH AND CORONARY ANGIOGRAPHY  2000   MV CAD --> CABG x 4   LEFT HEART CATH AND CORS/GRAFTS ANGIOGRAPHY N/A 06/22/2019   Procedure: LEFT HEART CATH AND CORS/GRAFTS ANGIOGRAPHY;  Surgeon: Anner Alm LELON, MD;  CULPRIT LESION = 100% SVG-OM2; Severe native CAD: mLM-Ost LAD-50% w/ Ost LCx 85% (DES PCI OM-OstLCx)- Treated b/c 100% TO SVG-OM2 as Culprit lesion - unable to open. OM2 100%. Ost RI 80%, Ost-mid LAD 50% -  100% mLAD after D1; ost RI 80%, mid RCA 100%; Patent SVG-RI, pedLIMA-dLAD, pedRIMA-dRCA; EF 45-50%   MULTIPLE TOOTH EXTRACTIONS  06/01/2016   @[facility]     due to abscesses   TONSILLECTOMY  1940   TRANSTHORACIC ECHOCARDIOGRAM  06/23/2019   Inferolateral STEMI (occluded SVG-OM2): (Definity ) mildly reduced LV function-EF 45 to 50%.  GR 1 DD.  No obvious R WMA.  Essentially normal valves.  Mild MR.   Family History  Problem Relation Age of Onset   Cancer Mother        LIVER   Cancer Brother        PROSTATE   Cancer Daughter    Social History   Socioeconomic History   Marital status: Widowed    Spouse name: Not on file   Number of children: Not on file   Years of education: Not on file   Highest education level: Not on file  Occupational History   Not on file  Tobacco Use   Smoking status: Former    Current packs/day: 0.00    Average packs/day: 2.0 packs/day for 25.0 years (50.0 ttl pk-yrs)    Types: Cigarettes    Start date: 05/03/1948    Quit date: 05/03/1973    Years since quitting: 50.0    Passive exposure: Never   Smokeless tobacco: Never  Vaping Use   Vaping status: Never Used  Substance and Sexual Activity   Alcohol use: Not Currently    Alcohol/week: 1.0 - 2.0 standard drink of alcohol    Types: 1 - 2 Glasses of wine per week    Comment: occasional   Drug use: Never   Sexual activity: Not Currently  Other Topics Concern   Not on file  Social History Narrative   Not on file   Social Drivers of Health   Financial Resource Strain: Low Risk  (05/12/2023)   Overall Financial Resource Strain (CARDIA)    Difficulty of Paying Living Expenses: Not hard at all  Food Insecurity: No Food Insecurity (05/12/2023)   Hunger Vital Sign    Worried  About Running Out of Food in the Last Year: Never true    Ran Out of Food in the Last Year: Never true  Transportation Needs: No Transportation Needs (05/12/2023)   PRAPARE - Administrator, Civil Service (Medical): No    Lack  of Transportation (Non-Medical): No  Physical Activity: Insufficiently Active (05/12/2023)   Exercise Vital Sign    Days of Exercise per Week: 7 days    Minutes of Exercise per Session: 20 min  Stress: Stress Concern Present (05/12/2023)   [employer] of Occupational Health - Occupational Stress Questionnaire    Feeling of Stress : To some extent  Social Connections: Socially Isolated (05/12/2023)   Social Connection and Isolation Panel [NHANES]    Frequency of Communication with Friends and Family: More than three times a week    Frequency of Social Gatherings with Friends and Family: More than three times a week    Attends Religious Services: Never    Database Administrator or Organizations: No    Attends Banker Meetings: Never    Marital Status: Widowed    Tobacco Counseling Counseling given: Not Answered   Clinical Intake:  Pre-visit preparation completed: No  Pain : No/denies pain     BMI - recorded: 30.82 Nutritional Status: BMI > 30  Obese Nutritional Risks: None Diabetes: No  How often do you need to have someone help you when you read instructions, pamphlets, or other written materials from your doctor or pharmacy?: 3 - Sometimes What is the last grade level you completed in school?: 4 years college  Interpreter Needed?: No      Activities of Daily Living    05/12/2023    2:41 PM 03/03/2023    8:00 PM  In your present state of health, do you have any difficulty performing the following activities:  Hearing? 1 0  Vision? 0 0  Difficulty concentrating or making decisions? 0 0  Walking or climbing stairs? 1   Dressing or bathing? 0   Doing errands, shopping? 0 0  Preparing Food and eating ? N   Using the Toilet? N   In the past six months, have you accidently leaked urine? Y   Do you have problems with loss of bowel control? N   Managing your Medications? N   Managing your Finances? N   Housekeeping or managing your Housekeeping? N      Patient Care Team: Gil Greig BRAVO, NP as PCP - General (Adult Health Nurse Practitioner) Anner Alm ORN, MD as PCP - Cardiology (Cardiology) Court Dorn PARAS, MD as Consulting Physician (Cardiology) Serene Gaile ORN, MD as Consulting Physician (Vascular Surgery) Rosan Credit, MD as Consulting Physician (Ophthalmology) Gaston Hamilton, MD as Consulting Physician (Urology) Rosan Credit, MD as Consulting Physician (Ophthalmology)  Indicate any recent Medical Services you may have received from other than [facility] providers in the past year (date may be approximate).     Assessment:   This is a routine wellness examination for Alex [name].  Hearing/Vision screen Hearing Screening - Comments:: No hearing concerns.  Vision Screening - Comments:: Patient has no hearing concerns. Patient last eye exam 4-5 years ago. Patient wears prescription glasses.    Goals Addressed             This Visit's Progress    Increase water  intake   On track    Starting 08/30/2016 I will start drinking 3 glasses of water  a day.       Depression Screen  05/12/2023    1:28 PM 10/27/2022    1:16 PM 10/14/2021   10:55 AM 10/16/2020    2:40 PM 05/29/2020   11:53 AM 01/21/2020    1:03 PM 09/14/2019    1:19 PM  PHQ 2/9 Scores  PHQ - 2 Score 0 0 0 0 0 0 0    Fall Risk    05/12/2023    1:28 PM 04/21/2023    1:13 PM 12/23/2022    2:18 PM 12/13/2022    2:18 PM 10/27/2022    1:16 PM  Fall Risk   Falls in the past year? 0 0 0 0 0  Number falls in past yr: 0 0 0 0 0  Injury with Fall? 0 0 0 0 0  Risk for fall due to : No Fall Risks No Fall Risks No Fall Risks No Fall Risks No Fall Risks  Follow up Falls evaluation completed;Education provided;Falls prevention discussed Falls evaluation completed;Education provided;Falls prevention discussed Falls evaluation completed;Education provided;Falls prevention discussed Falls evaluation completed;Education provided;Falls prevention discussed Falls evaluation  completed    MEDICARE RISK AT HOME: Medicare Risk at Home Any stairs in or around the home?: Yes If so, are there any without handrails?: Yes Home free of loose throw rugs in walkways, pet beds, electrical cords, etc?: Yes Adequate lighting in your home to reduce risk of falls?: Yes Life alert?: No Use of a cane, walker or w/c?: Yes Grab bars in the bathroom?: Yes Shower chair or bench in shower?: Yes Elevated toilet seat or a handicapped toilet?: Yes  TIMED UP AND GO:  Was the test performed?  No    Cognitive Function:    05/12/2023    1:32 PM 01/21/2020    1:15 PM 09/09/2017    1:51 PM 08/30/2016   10:36 AM 08/27/2015    2:55 PM  MMSE - Mini Mental State Exam  Orientation to time 5 4 5 5 5   Orientation to Place 5 5 5 5 5   Registration 3 3 3 3 3   Attention/ Calculation 1 5 5 5 5   Recall 1 3 2  0 3  Language- name 2 objects 2 2 2 2 2   Language- repeat 0 1 1 1 1   Language- follow 3 step command 3 3 3 3 3   Language- read & follow direction 1 1 1 1 1   Write a sentence 1 1 1 1 1   Copy design 0 0 1 0 1  Total score 22 28 29 26 30         10/14/2021   10:56 AM 10/16/2020    2:43 PM 09/14/2019    1:20 PM 09/12/2018   10:25 AM  6CIT Screen  What Year? 0 points 0 points 0 points 0 points  What month? 0 points 0 points 0 points 0 points  What time? 0 points 0 points 0 points 0 points  Count back from 20 2 points 2 points 0 points 0 points  Months in reverse 0 points 0 points 0 points 0 points  Repeat phrase 2 points 4 points 10 points 0 points  Total Score 4 points 6 points 10 points 0 points    Immunizations Immunization History  Administered Date(s) Administered   Fluad Quad(high Dose 65+) 01/18/2019, 01/21/2020   Influenza Whole 02/06/2013   Influenza, High Dose Seasonal PF 12/09/2015, 01/10/2017, 01/19/2018, 01/17/2023   Influenza-Unspecified 01/31/2009, 01/08/2014, 12/13/2014   Moderna Covid-19 Fall Seasonal Vaccine 22yrs & older 01/25/2022   Moderna Covid-19 Vaccine  Bivalent Booster 109yrs &  up 09/24/2021   Moderna Sars-Covid-2 Vaccination 05/12/2019, 07/11/2019, 08/08/2019, 04/10/2020, 02/23/2021   PFIZER Comirnaty(Gray Top)Covid-19 Tri-Sucrose Vaccine 01/17/2023   Pneumococcal Conjugate-13 11/08/2013   Pneumococcal-Unspecified 05/03/2004   Td 05/03/2004   Tdap 10/31/2019   Zoster, Live 07/19/2012    TDAP status: Up to date  Flu Vaccine status: Up to date  Pneumococcal vaccine status: Up to date  Covid-19 vaccine status: Completed vaccines  Qualifies for Shingles Vaccine? Yes   Zostavax completed Yes   Shingrix Completed?: No.    Education has been provided regarding the importance of this vaccine. Patient has been advised to call insurance company to determine out of pocket expense if they have not yet received this vaccine. Advised may also receive vaccine at local pharmacy or Health Dept. Verbalized acceptance and understanding.  Screening Tests Health Maintenance  Topic Date Due   COVID-19 Vaccine (9 - 2024-25 season) 03/14/2023   Pneumonia Vaccine 35+ Years old (2 of 2 - PPSV23 or PCV20) 10/27/2023 (Originally 01/03/2014)   Medicare Annual Wellness (AWV)  05/11/2024   DTaP/Tdap/Td (3 - Td or Tdap) 10/30/2029   INFLUENZA VACCINE  Completed   HPV VACCINES  Aged Out   Zoster Vaccines- Shingrix  Discontinued    Health Maintenance  Health Maintenance Due  Topic Date Due   COVID-19 Vaccine (9 - 2024-25 season) 03/14/2023    Colorectal cancer screening: No longer required.   Lung Cancer Screening: (Low Dose CT Chest recommended if Age 29-80 years, 20 pack-year currently smoking OR have quit w/in 15years.) does not qualify.   Lung Cancer Screening Referral: No  Additional Screening:  Hepatitis C Screening: does not qualify; Completed   Vision Screening: Recommended annual ophthalmology exams for early detection of glaucoma and other disorders of the eye. Is the patient up to date with their annual eye exam?  Yes  Who is the  provider or what is the name of the office in which the patient attends annual eye exams? Cannot recall name If pt is not established with a provider, would they like to be referred to a provider to establish care? No .   Dental Screening: Recommended annual dental exams for proper oral hygiene  Diabetic Foot Exam: Diabetic Foot Exam: Completed 05/12/2023  Community Resource Referral / Chronic Care Management: CRR required this visit?  No   CCM required this visit?  No     Plan:     I have personally reviewed and noted the following in the patient's chart:   Medical and social history Use of alcohol, tobacco or illicit drugs  Current medications and supplements including opioid prescriptions. Patient is not currently taking opioid prescriptions. Functional ability and status Nutritional status Physical activity Advanced directives List of other physicians Hospitalizations, surgeries, and ER visits in previous 12 months Vitals Screenings to include cognitive, depression, and falls Referrals and appointments  In addition, I have reviewed and discussed with patient certain preventive protocols, quality metrics, and best practice recommendations. A written personalized care plan for preventive services as well as general preventive health recommendations were provided to patient.     Greig FORBES Cluster, NP   05/12/2023   After Visit Summary: (MyChart) Due to this being a telephonic visit, the after visit summary with patients personalized plan was offered to patient via MyChart   Nurse Notes: MMSE 22/30, UTD on all vaccinations, schedule yearly eye exam.

## 2023-05-28 ENCOUNTER — Other Ambulatory Visit: Payer: Self-pay | Admitting: Cardiology

## 2023-06-10 ENCOUNTER — Other Ambulatory Visit: Payer: Self-pay | Admitting: Orthopedic Surgery

## 2023-06-10 ENCOUNTER — Telehealth: Payer: Self-pay

## 2023-06-10 DIAGNOSIS — M79672 Pain in left foot: Secondary | ICD-10-CM

## 2023-06-10 NOTE — Telephone Encounter (Signed)
 Patient was notified.

## 2023-06-10 NOTE — Telephone Encounter (Signed)
 Orders placed with [facility] for xray left foot.

## 2023-06-10 NOTE — Telephone Encounter (Signed)
 Patient called and states he might have broke the second toe on left foot and would like some imaging done at [facility]

## 2023-06-13 ENCOUNTER — Other Ambulatory Visit: Payer: Self-pay | Admitting: Orthopedic Surgery

## 2023-06-13 ENCOUNTER — Ambulatory Visit
Admission: RE | Admit: 2023-06-13 | Discharge: 2023-06-13 | Disposition: A | Discharge status: Home / Self Care | Payer: PPO | Source: Ambulatory Visit | Attending: Orthopedic Surgery | Admitting: Orthopedic Surgery

## 2023-06-13 DIAGNOSIS — M79672 Pain in left foot: Secondary | ICD-10-CM | POA: Diagnosis not present

## 2023-06-13 DIAGNOSIS — M7731 Calcaneal spur, right foot: Secondary | ICD-10-CM

## 2023-06-13 DIAGNOSIS — M7732 Calcaneal spur, left foot: Secondary | ICD-10-CM | POA: Diagnosis not present

## 2023-06-13 MED ORDER — ACETAMINOPHEN 500 MG PO TABS: 500.0000 mg | ORAL_TABLET | Freq: Three times a day (TID) | ORAL | Status: AC | PRN

## 2023-06-13 MED ORDER — PREDNISONE 20 MG PO TABS: 20.0000 mg | ORAL_TABLET | Freq: Every day | ORAL | 0 refills | Status: AC

## 2023-06-17 ENCOUNTER — Telehealth: Payer: Self-pay | Admitting: Orthopedic Surgery

## 2023-06-17 ENCOUNTER — Telehealth: Payer: Self-pay

## 2023-06-17 NOTE — Telephone Encounter (Signed)
Patient son was calling back to discuss the results of patient foot x ray because he was confused. I called  imaging to confirm the results that Octavia Heir, NP

## 2023-06-17 NOTE — Telephone Encounter (Signed)
Patients son, Petar Mucci) called to see if his father could get a referral for orthopedic surgeon from [facility].

## 2023-06-24 ENCOUNTER — Other Ambulatory Visit: Payer: Self-pay | Admitting: Orthopedic Surgery

## 2023-06-24 NOTE — Telephone Encounter (Signed)
Patient is requesting a refill of the following medications: Requested Prescriptions   Pending Prescriptions Disp Refills   diazepam (VALIUM) 5 MG tablet [Pharmacy Med Name: DIAZEPAM 5MG  TABLETS] 30 tablet     Sig: TAKE 1/2 TABLET(2.5 MG) BY MOUTH AT BEDTIME    Date of last refill:04/22/2023  Refill amount: 30 tablets 0 refill  Treatment agreement date:

## 2023-07-28 ENCOUNTER — Other Ambulatory Visit: Payer: Self-pay | Admitting: Orthopedic Surgery

## 2023-07-28 DIAGNOSIS — I10 Essential (primary) hypertension: Secondary | ICD-10-CM

## 2023-09-20 ENCOUNTER — Other Ambulatory Visit: Payer: Self-pay | Admitting: Orthopedic Surgery

## 2023-09-20 DIAGNOSIS — R351 Nocturia: Secondary | ICD-10-CM

## 2023-10-03 ENCOUNTER — Other Ambulatory Visit: Payer: Self-pay | Admitting: Orthopedic Surgery

## 2023-10-24 ENCOUNTER — Telehealth: Payer: Self-pay | Admitting: *Deleted

## 2023-10-24 DIAGNOSIS — I10 Essential (primary) hypertension: Secondary | ICD-10-CM

## 2023-10-24 MED ORDER — DIAZEPAM 5 MG PO TABS: 2.5000 mg | ORAL_TABLET | Freq: Every day | ORAL | 1 refills | Status: DC

## 2023-10-24 MED ORDER — RAMIPRIL 10 MG PO CAPS
ORAL_CAPSULE | ORAL | 1 refills | Status: DC
Start: 2023-10-24 — End: 2024-02-24

## 2023-10-24 NOTE — Telephone Encounter (Signed)
 Pharmacy requested refills.  Epic LR:  06/24/2023 Contract Date: 10/27/2022. Note added to upcoming appointment to update.   Pended Rx's and sent to Greig Cluster, NP for approval.

## 2023-11-03 ENCOUNTER — Ambulatory Visit: Payer: Medicare HMO | Admitting: Orthopedic Surgery

## 2023-12-28 ENCOUNTER — Other Ambulatory Visit: Payer: Self-pay | Admitting: Cardiology

## 2024-01-19 ENCOUNTER — Other Ambulatory Visit: Payer: Self-pay | Admitting: Orthopedic Surgery

## 2024-01-19 DIAGNOSIS — I10 Essential (primary) hypertension: Secondary | ICD-10-CM

## 2024-02-06 ENCOUNTER — Other Ambulatory Visit: Payer: Self-pay | Admitting: Orthopedic Surgery

## 2024-02-06 ENCOUNTER — Telehealth: Payer: Self-pay

## 2024-02-06 MED ORDER — CLOPIDOGREL BISULFATE 75 MG PO TABS: 75.0000 mg | ORAL_TABLET | Freq: Every day | ORAL | 0 refills | Status: DC

## 2024-02-06 NOTE — Telephone Encounter (Signed)
 Incoming fax received from [facility] requesting a refill on Clopidogrel . Please advise as it appears medication was last filled by cardiologist

## 2024-02-06 NOTE — Telephone Encounter (Signed)
 I left patient a detailed voicemail informing him that he is due for a routine visit with Amy and should also contact his cardiologist to schedule a follow-up

## 2024-02-06 NOTE — Telephone Encounter (Signed)
 Plavix  refill for 30 days given. I am unable to refill any further until patient is seen. Last visit with me was 04/2023. He can also reach out to cardiology for refill.

## 2024-02-09 ENCOUNTER — Other Ambulatory Visit: Payer: Self-pay

## 2024-02-09 MED ORDER — CLOPIDOGREL BISULFATE 75 MG PO TABS: 75.0000 mg | ORAL_TABLET | Freq: Every day | ORAL | 0 refills | Status: DC

## 2024-02-09 NOTE — Telephone Encounter (Signed)
 Received medication refill through [employer] to send patients medication to [facility] instead of [facility].

## 2024-02-23 ENCOUNTER — Ambulatory Visit: Admitting: Orthopedic Surgery

## 2024-02-23 ENCOUNTER — Encounter: Payer: Self-pay | Admitting: Orthopedic Surgery

## 2024-02-23 VITALS — BP 124/70 | HR 62 | Temp 98.0°F | Ht 70.0 in | Wt 208.0 lb

## 2024-02-23 DIAGNOSIS — R351 Nocturia: Secondary | ICD-10-CM

## 2024-02-23 DIAGNOSIS — N401 Enlarged prostate with lower urinary tract symptoms: Secondary | ICD-10-CM

## 2024-02-23 DIAGNOSIS — R338 Other retention of urine: Secondary | ICD-10-CM | POA: Diagnosis not present

## 2024-02-23 DIAGNOSIS — I25119 Atherosclerotic heart disease of native coronary artery with unspecified angina pectoris: Secondary | ICD-10-CM | POA: Diagnosis not present

## 2024-02-23 DIAGNOSIS — G4700 Insomnia, unspecified: Secondary | ICD-10-CM

## 2024-02-23 DIAGNOSIS — I10 Essential (primary) hypertension: Secondary | ICD-10-CM | POA: Diagnosis not present

## 2024-02-23 DIAGNOSIS — R7303 Prediabetes: Secondary | ICD-10-CM

## 2024-02-23 DIAGNOSIS — N1831 Chronic kidney disease, stage 3a: Secondary | ICD-10-CM | POA: Diagnosis not present

## 2024-02-23 DIAGNOSIS — E785 Hyperlipidemia, unspecified: Secondary | ICD-10-CM

## 2024-02-23 DIAGNOSIS — R339 Retention of urine, unspecified: Secondary | ICD-10-CM

## 2024-02-23 LAB — CBC WITH DIFFERENTIAL/PLATELET
Absolute Lymphocytes: 2201 {cells}/uL (ref 850–3900)
Absolute Monocytes: 613 {cells}/uL (ref 200–950)
Basophils Absolute: 59 {cells}/uL (ref 0–200)
Basophils Relative: 0.7 %
Eosinophils Absolute: 269 {cells}/uL (ref 15–500)
Eosinophils Relative: 3.2 %
HCT: 37.4 % — ABNORMAL LOW (ref 38.5–50.0)
Hemoglobin: 12.3 g/dL — ABNORMAL LOW (ref 13.2–17.1)
MCH: 31.7 pg (ref 27.0–33.0)
MCHC: 32.9 g/dL (ref 32.0–36.0)
MCV: 96.4 fL (ref 80.0–100.0)
MPV: 9.6 fL (ref 7.5–12.5)
Monocytes Relative: 7.3 %
Neutro Abs: 5258 {cells}/uL (ref 1500–7800)
Neutrophils Relative %: 62.6 %
Platelets: 247 Thousand/uL (ref 140–400)
RBC: 3.88 Million/uL — ABNORMAL LOW (ref 4.20–5.80)
RDW: 13.2 % (ref 11.0–15.0)
Total Lymphocyte: 26.2 %
WBC: 8.4 Thousand/uL (ref 3.8–10.8)

## 2024-02-23 LAB — HEMOGLOBIN A1C
Hgb A1c MFr Bld: 5.5 % (ref <5.7)
Mean Plasma Glucose: 111 mg/dL
eAG (mmol/L): 6.2 mmol/L

## 2024-02-23 LAB — COMPREHENSIVE METABOLIC PANEL WITH GFR
AG Ratio: 1.4 (calc) (ref 1.0–2.5)
ALT: 9 U/L (ref 9–46)
AST: 11 U/L (ref 10–35)
Albumin: 4 g/dL (ref 3.6–5.1)
Alkaline phosphatase (APISO): 57 U/L (ref 35–144)
BUN/Creatinine Ratio: 11 (calc) (ref 6–22)
BUN: 14 mg/dL (ref 7–25)
CO2: 28 mmol/L (ref 20–32)
Calcium: 9.2 mg/dL (ref 8.6–10.3)
Chloride: 104 mmol/L (ref 98–110)
Creat: 1.26 mg/dL — ABNORMAL HIGH (ref 0.70–1.22)
Globulin: 2.9 g/dL (ref 1.9–3.7)
Glucose, Bld: 86 mg/dL (ref 65–99)
Potassium: 3.6 mmol/L (ref 3.5–5.3)
Sodium: 141 mmol/L (ref 135–146)
Total Bilirubin: 0.6 mg/dL (ref 0.2–1.2)
Total Protein: 6.9 g/dL (ref 6.1–8.1)
eGFR: 54 mL/min/1.73m2 — ABNORMAL LOW (ref >=60)

## 2024-02-23 LAB — TSH: TSH: 1.43 m[IU]/L (ref 0.40–4.50)

## 2024-02-23 MED ORDER — TAMSULOSIN HCL 0.4 MG PO CAPS: 0.4000 mg | ORAL_CAPSULE | Freq: Every day | ORAL | 1 refills | Status: AC

## 2024-02-23 MED ORDER — DIAZEPAM 5 MG PO TABS: 2.5000 mg | ORAL_TABLET | Freq: Every evening | ORAL | 0 refills | Status: DC | PRN

## 2024-02-23 MED ORDER — METOPROLOL SUCCINATE ER 50 MG PO TB24: ORAL_TABLET | ORAL | 1 refills | Status: AC

## 2024-02-23 MED ORDER — ROSUVASTATIN CALCIUM 40 MG PO TABS: 40.0000 mg | ORAL_TABLET | Freq: Every day | ORAL | 1 refills | Status: AC

## 2024-02-23 MED ORDER — CLOPIDOGREL BISULFATE 75 MG PO TABS: 75.0000 mg | ORAL_TABLET | Freq: Every day | ORAL | 1 refills | Status: AC

## 2024-02-23 MED ORDER — FINASTERIDE 5 MG PO TABS: 5.0000 mg | ORAL_TABLET | Freq: Every day | ORAL | 1 refills | Status: AC

## 2024-02-23 MED ORDER — DOXEPIN HCL 75 MG PO CAPS: 75.0000 mg | ORAL_CAPSULE | Freq: Every day | ORAL | 1 refills | Status: AC

## 2024-02-23 NOTE — Patient Instructions (Addendum)
 Try to reduce fluids 2-3 hours before bedtime to reduce urination  Limit beer before bedtime to reduce urination  Biotene mouthwash> swish and spit at night to prevent dry mouth  Vaseline on lips

## 2024-02-23 NOTE — Progress Notes (Unsigned)
 Careteam: Patient Care Team: Gil Greig BRAVO, NP as PCP - General (Adult Health Nurse Practitioner) Anner Alm ORN, MD as PCP - Cardiology (Cardiology) Court Dorn PARAS, MD as Consulting Physician (Cardiology) Serene Gaile ORN, MD as Consulting Physician (Vascular Surgery) Rosan Credit, MD as Consulting Physician (Ophthalmology) Gaston Hamilton, MD as Consulting Physician (Urology) Rosan Credit, MD as Consulting Physician (Ophthalmology)  Seen by: Greig Gil, AGNP-C  PLACE OF SERVICE:  [facility]  Advanced Directive information    No Known Allergies  Chief Complaint  Patient presents with   Follow-up    Medication refill     HPI: Patient is a 89 y.o. male seen today for medical management of chronic conditions.   Discussed the use of AI scribe software for clinical note transcription with the patient, who gave verbal consent to proceed.  History of Present Illness   Alex [name] is a 89 year old male who presents for a routine follow-up visit.  He has a history of a urethral stone on the left side, for which he underwent a cystoscopy in October of last year. He was prescribed Flomax  and continues to take it. He experiences nocturnal urinary incontinence, requiring the use of pads and briefs, which has worsened recently. He also takes doxepin  75 mg.  He experiences dry mouth, particularly at night, and uses a lubricant. He recalls a past tooth problem that may be related. He has not used Biotene.  He has a history of dizziness and nausea, for which he was previously prescribed diazepam . He has not experienced these symptoms recently but continues to take a half tablet of diazepam  as needed.   He reports a fall on his front porch, resulting in no serious injury. He uses a buggy for support while walking to prevent falls and maintain mobility.      Review of Systems:  Review of Systems  Constitutional: Negative.   HENT:  Positive for hearing loss.    Respiratory: Negative.    Cardiovascular: Negative.   Gastrointestinal: Negative.   Genitourinary:  Positive for frequency. Negative for hematuria.  Musculoskeletal:  Positive for falls. Negative for joint pain.  Neurological:  Negative for dizziness, weakness and headaches.  Psychiatric/Behavioral:  Negative for depression. The patient has insomnia. The patient is not nervous/anxious.     Past Medical History:  Diagnosis Date   Acquired deformity of nose    Anemia    Anxiety    Arthritis    Benign localized prostatic hyperplasia with lower urinary tract symptoms (LUTS)    Bilateral lower extremity edema    chronic   Bladder calculus    Cervicalgia    CHF (congestive heart failure) (HCC)    Ischemic CM   Chronic heart failure with preserved ejection fraction (HFpEF) (HCC)    followed by cardiology---- per last echo in epic   20-20-2021  EF ~45-50%   Complication of anesthesia    history post op urinary retension 02/ 2016 post op Left CEA   Emphysema lung (HCC)    per pt has advair prn   Essential hypertension    GERD (gastroesophageal reflux disease)    History of esophagitis    History of kidney stones    History of nonmelanoma skin cancer    History of ST elevation myocardial infarction (STEMI) 06/22/2019   True posterior STEMI -> occluded SVG.  Native LM--LCX PCI w/ DES   Hyperlipidemia    Insomnia, unspecified    Internal hemorrhoids without mention of complication  Ischemic cardiomyopathy    last echo in epic 06-23-2019  ef 45-50%   Left carotid artery stenosis    S/P L CEA - 2015 (Dr. Serene)   Left ureteral calculus    Multiple vessel coronary artery disease 2000   cardiologist--- dr anner ;   cath 2000 ~50% LM-ostLAD with 85% ost-mid LCx, 80% ost RI, 100% CTO RCA -> CABG X 4 (LIMA-LAD, RIMA-dRCA, SVG-OM2, SVG-RI) (Dr. Kerrin) => 06/2019:  posterior STEMI : 100% SVG-OM2 (unable to open), 100% OM2; DES PCI LM-mLCx.   Myocardial infarction [facility])    STEMI  2021    DES x 1   Pre-diabetes    S/P drug eluting coronary stent placement 06/22/2019   PCI and DES x1  to LM -- mLCx   Wears glasses    Past Surgical History:  Procedure Laterality Date   COLONOSCOPY WITH ESOPHAGOGASTRODUODENOSCOPY (EGD)  2007   Dr Geroge   CORONARY ARTERY BYPASS GRAFT  09/11/1998   @ [facility] by Dr. GORMAN.  Hendrickson - CABG x 4:  LIMA-LAD, RIMA-dRCA, SVG-OM2, SVG-RI   CORONARY STENT INTERVENTION N/A 06/22/2019   Procedure: CORONARY STENT INTERVENTION;  Surgeon: anner Alm ORN, MD;  Location: [facility] INVASIVE CV LAB; unsuccessful attempt at revascularization of SVG-OM2 => Synergy DES PCI from LM-mid LCx (Synergy DES 2.5 mm x 38 mm--> 3.1-2.6 mm); unfortunately OM2 was occluded.  This revascularized OM1, OM 3 and OM 4.   CORONARY/GRAFT ACUTE MI REVASCULARIZATION N/A 06/22/2019   Procedure: CORONARY/GRAFT ACUTE MI REVASCULARIZATION;  Surgeon: anner Alm ORN, MD;  Location: [facility] INVASIVE CV LAB;; unsuccessful PTCA/aspiration thrombectomy of SVG-OM1 as culprit lesion for STEMI   CYSTOSCOPY WITH LITHOLAPAXY N/A 02/17/2023   Procedure: CYSTOSCOPY WITH LITHOLAPAXY;  Surgeon: Shane Steffan BROCKS, MD;  Location: [facility];  Service: Urology;  Laterality: N/A;  60 MINUTES   CYSTOSCOPY WITH URETEROSCOPY AND STENT PLACEMENT Left 03/03/2023   Procedure: CYSTOSCOPY WITH LEFT ANTEGRADE URETEROSCOPY AND ANTEGRADE STENT PLACEMENT;  Surgeon: Shane Steffan BROCKS, MD;  Location: [facility];  Service: Urology;  Laterality: Left;  90 MINS   CYSTOSCOPY/URETEROSCOPY/HOLMIUM LASER/STENT PLACEMENT Left 02/17/2023   Procedure: CYSTOSCOPY LEFT URETEROSCOPY, ATTEMPTED;  Surgeon: Shane Steffan BROCKS, MD;  Location: [facility];  Service: Urology;  Laterality: Left;   ENDARTERECTOMY Left 06/14/2014   Procedure: LEFT CAROTID ENDARTERECTOMY WITH VASCUGUARD PATCH ANGIOPLASTY;  Surgeon: Gaile ORN Serene, MD;  Location: [facility];  Service: Vascular;  Laterality: Left;   EXCISIONAL HEMORRHOIDECTOMY   1981   EXTRACORPOREAL SHOCK WAVE LITHOTRIPSY  2005   INCISION AND DRAINAGE Right 06/06/2016   @[facility]    right mandible area for abscess   IR NEPHROSTOMY PLACEMENT LEFT  02/17/2023   LEFT HEART CATH AND CORONARY ANGIOGRAPHY  2000   MV CAD --> CABG x 4   LEFT HEART CATH AND CORS/GRAFTS ANGIOGRAPHY N/A 06/22/2019   Procedure: LEFT HEART CATH AND CORS/GRAFTS ANGIOGRAPHY;  Surgeon: anner Alm ORN, MD;  CULPRIT LESION = 100% SVG-OM2; Severe native CAD: mLM-Ost LAD-50% w/ Ost LCx 85% (DES PCI OM-OstLCx)- Treated b/c 100% TO SVG-OM2 as Culprit lesion - unable to open. OM2 100%. Ost RI 80%, Ost-mid LAD 50% - 100% mLAD after D1; ost RI 80%, mid RCA 100%; Patent SVG-RI, pedLIMA-dLAD, pedRIMA-dRCA; EF 45-50%   MULTIPLE TOOTH EXTRACTIONS  06/01/2016   @[facility]     due to abscesses   TONSILLECTOMY  1940   TRANSTHORACIC ECHOCARDIOGRAM  06/23/2019   Inferolateral STEMI (occluded SVG-OM2): (Definity ) mildly reduced LV function-EF 45 to 50%.  GR  1 DD.  No obvious R WMA.  Essentially normal valves.  Mild MR.   Social History:   reports that he quit smoking about 50 years ago. His smoking use included cigarettes. He started smoking about 75 years ago. He has a 50 pack-year smoking history. He has never been exposed to tobacco smoke. He has never used smokeless tobacco. He reports that he does not currently use alcohol after a past usage of about 1.0 - 2.0 standard drink of alcohol per week. He reports that he does not use drugs.  Family History  Problem Relation Age of Onset   Cancer Mother        LIVER   Cancer Brother        PROSTATE   Cancer Daughter     Medications: Patient's Medications  New Prescriptions   No medications on file  Previous Medications   ACETAMINOPHEN  (TYLENOL ) 500 MG TABLET    Take 1-2 tablets (500-1,000 mg total) by mouth 3 (three) times daily as needed.   ALPHA-LIPOIC ACID 200 MG CAPS    Take 200 mg by mouth.   ASCORBIC ACID  (VITAMIN C ) 500 MG TABLET    Take 500-1,000 mg by  mouth daily.   B COMPLEX VITAMINS TABLET    Take 1 tablet by mouth daily.   BOSWELLIA SERRATA (BOSWELLIA PO)    Take 800 mg by mouth.   CHOLECALCIFEROL  (VITAMIN D3) 125 MCG (5000 UT) CAPS    Take 5,000 Units by mouth daily.   CINNAMON 500 MG TABS    Take 500 mg by mouth 3 (three) times a week.    CITRUS BERGAMOT PO    Take 1 capsule by mouth 2 (two) times a week.   CLOPIDOGREL  (PLAVIX ) 75 MG TABLET    Take 1 tablet (75 mg total) by mouth daily.   COENZYME Q10 (CO Q 10) 100 MG CAPS    Take 100 mg by mouth daily.    CYANOCOBALAMIN (VITAMIN B-12 PO)    Take 1 tablet by mouth daily.   DIAZEPAM  (VALIUM ) 5 MG TABLET    Take 0.5 tablets (2.5 mg total) by mouth at bedtime.   DOXEPIN  (SINEQUAN ) 75 MG CAPSULE    TAKE 1 CAPSULE(75 MG) BY MOUTH AT BEDTIME   FINASTERIDE  (PROSCAR ) 5 MG TABLET    Take 5 mg by mouth daily.   FLUTICASONE -SALMETEROL (ADVAIR) 250-50 MCG/DOSE AEPB    Inhale 1 puff into the lungs daily as needed.   GRAPE SEED OIL    Take 1 capsule by mouth daily.   METOPROLOL  SUCCINATE (TOPROL -XL) 50 MG 24 HR TABLET    TAKE 1 TABLET BY MOUTH DAILY WITH OR IMMEDIATELY FOLLOWING A MEAL   NITROGLYCERIN  (NITROSTAT ) 0.4 MG SL TABLET    Place 1 tablet (0.4 mg total) under the tongue every 5 (five) minutes as needed.   POLYETHYLENE GLYCOL (MIRALAX ) 17 G PACKET    Take 17 g by mouth daily.   RAMIPRIL  (ALTACE ) 10 MG CAPSULE    TAKE 1 CAPSULE BY MOUTH EVERY DAY   RESVERATROL 100 MG CAPS    Take 100 mg by mouth daily.    ROSUVASTATIN  (CRESTOR ) 40 MG TABLET    Take 1 tablet (40 mg total) by mouth daily.   TAMSULOSIN  (FLOMAX ) 0.4 MG CAPS CAPSULE    TAKE 1 CAPSULE(0.4 MG) BY MOUTH DAILY   TURMERIC PO    Take 1 capsule by mouth as needed.   Modified Medications   No medications on file  Discontinued Medications  No medications on file    Physical Exam:  Vitals:   02/23/24 1419  BP: 124/70  Pulse: 62  Temp: 98 F (36.7 C)  SpO2: 98%  Weight: 208 lb (94.3 kg)  Height: 5' 10 (1.778 m)   Body mass  index is 29.84 kg/m. Wt Readings from Last 3 Encounters:  02/23/24 208 lb (94.3 kg)  05/12/23 214 lb 12.8 oz (97.4 kg)  04/21/23 214 lb 12.8 oz (97.4 kg)    Physical Exam Vitals reviewed.  Constitutional:      General: He is not in acute distress. HENT:     Head: Normocephalic.  Eyes:     General:        Right eye: No discharge.        Left eye: No discharge.  Cardiovascular:     Rate and Rhythm: Normal rate and regular rhythm.     Pulses: Normal pulses.     Heart sounds: Normal heart sounds.  Pulmonary:     Effort: Pulmonary effort is normal.     Breath sounds: Normal breath sounds.  Abdominal:     General: Bowel sounds are normal. There is no distension.     Palpations: Abdomen is soft.     Tenderness: There is no abdominal tenderness.  Musculoskeletal:     Cervical back: Neck supple.     Right lower leg: No edema.     Left lower leg: No edema.  Skin:    General: Skin is warm.     Capillary Refill: Capillary refill takes less than 2 seconds.  Neurological:     General: No focal deficit present.     Mental Status: He is alert and oriented to person, place, and time.     Gait: Gait abnormal.  Psychiatric:        Mood and Affect: Mood normal.     Labs reviewed: Basic Metabolic Panel: Recent Labs    03/04/23 0511 04/21/23 1341  NA 138 139  K 4.2 3.9  CL 106 104  CO2 23 28  GLUCOSE 127* 125  BUN 17 10  CREATININE 0.81 0.92  CALCIUM  8.5* 9.1   Liver Function Tests: No results for input(s): AST, ALT, ALKPHOS, BILITOT, PROT, ALBUMIN  in the last 8760 hours. No results for input(s): LIPASE, AMYLASE in the last 8760 hours. No results for input(s): AMMONIA in the last 8760 hours. CBC: Recent Labs    03/02/23 1439 03/03/23 1551 03/04/23 0511 04/21/23 1341  WBC 9.1  --  10.4 6.4  NEUTROABS  --   --  8.4* 3,718  HGB 11.6* 11.8* 11.8* 12.5*  HCT 36.1* 37.4* 36.7* 37.6*  MCV 100.6*  --  100.3* 97.2  PLT 272  --  248 216   Lipid  Panel: No results for input(s): CHOL, HDL, LDLCALC, TRIG, CHOLHDL, LDLDIRECT in the last 8760 hours. TSH: No results for input(s): TSH in the last 8760 hours. A1C: Lab Results  Component Value Date   HGBA1C 5.8 (H) 04/14/2022     Assessment/Plan [place] prostatic hyperplasia with urinary retention (Primary) - followed by [facility] - cont tamsulosin  and doxepin ( increased dose by Dr. Shane) - doxepin  (SINEQUAN ) 75 MG capsule; Take 1 capsule (75 mg total) by mouth daily.  Dispense: 90 capsule; Refill: 1 - finasteride  (PROSCAR ) 5 MG tablet; Take 1 tablet (5 mg total) by mouth daily.  Dispense: 90 tablet; Refill: 1  2. Primary hypertension - controlled, goal < 150/90 - BUN/creat 14-1.26 02/23/2024 - EKG done 10/20/2022 - metoprolol   succinate (TOPROL -XL) 50 MG 24 hr tablet; TAKE 1 TABLET BY MOUTH DAILY WITH OR IMMEDIATELY FOLLOWING A MEAL  Dispense: 90 tablet; Refill: 1 - CBC with Differential/Platelet - Complete Metabolic Panel with eGFR  3. Hyperlipidemia with target LDL less than 70 - total 126, LDL 66 10/27/2022 - rosuvastatin  (CRESTOR ) 40 MG tablet; Take 1 tablet (40 mg total) by mouth daily.  Dispense: 90 tablet; Refill: 1  4. Coronary artery disease involving native coronary artery of native heart with angina pectoris - h/o left carotid stenosis - cont Plavix  and rosuvastatin  - clopidogrel  (PLAVIX ) 75 MG tablet; Take 1 tablet (75 mg total) by mouth daily.  Dispense: 90 tablet; Refill: 1 - TSH  5. Nocturia - discussed limiting fluids before bedtime - recommend drinking daily beer earlier in afternoon - tamsulosin  (FLOMAX ) 0.4 MG CAPS capsule; Take 1 capsule (0.4 mg total) by mouth daily after supper.  Dispense: 90 capsule; Refill: 1  6. Prediabetes - A1c was 5.8 04/14/2024 - Hemoglobin A1c  7. Insomnia, unspecified type - diazepam  (VALIUM ) 5 MG tablet; Take 0.5 tablets (2.5 mg total) by mouth at bedtime as needed for anxiety.  Dispense: 15 tablet;  Refill: 0  Total time: 39 minutes. Greater than 50% of total time spent doing patient education regarding health maintenance, HTN, urinary retention, nocturia, CAD and insomnia including symptom/medication management.    Next appt: 05/17/2024  Greig Cluster, ELNITA  [facility] [phone]

## 2024-02-24 ENCOUNTER — Other Ambulatory Visit: Payer: Self-pay | Admitting: Orthopedic Surgery

## 2024-02-24 ENCOUNTER — Ambulatory Visit: Payer: Self-pay | Admitting: Orthopedic Surgery

## 2024-02-24 DIAGNOSIS — I10 Essential (primary) hypertension: Secondary | ICD-10-CM

## 2024-02-24 MED ORDER — RAMIPRIL 5 MG PO CAPS: 5.0000 mg | ORAL_CAPSULE | Freq: Every day | ORAL | 3 refills | Status: DC

## 2024-03-01 ENCOUNTER — Other Ambulatory Visit: Payer: Self-pay | Admitting: Orthopedic Surgery

## 2024-03-01 DIAGNOSIS — I25119 Atherosclerotic heart disease of native coronary artery with unspecified angina pectoris: Secondary | ICD-10-CM

## 2024-03-09 ENCOUNTER — Other Ambulatory Visit: Payer: Self-pay

## 2024-03-09 DIAGNOSIS — I10 Essential (primary) hypertension: Secondary | ICD-10-CM

## 2024-03-09 MED ORDER — RAMIPRIL 5 MG PO CAPS
5.0000 mg | ORAL_CAPSULE | Freq: Every day | ORAL | 3 refills | Status: AC
Start: 2024-03-09 — End: ?

## 2024-03-16 ENCOUNTER — Emergency Department (HOSPITAL_COMMUNITY)

## 2024-03-16 ENCOUNTER — Emergency Department (HOSPITAL_COMMUNITY): Admission: EM | Admit: 2024-03-16 | Discharge: 2024-03-17 | Disposition: A | Discharge status: Home / Self Care

## 2024-03-16 ENCOUNTER — Other Ambulatory Visit: Payer: Self-pay

## 2024-03-16 ENCOUNTER — Encounter (HOSPITAL_COMMUNITY): Payer: Self-pay

## 2024-03-16 DIAGNOSIS — R600 Localized edema: Secondary | ICD-10-CM | POA: Insufficient documentation

## 2024-03-16 DIAGNOSIS — Z7902 Long term (current) use of antithrombotics/antiplatelets: Secondary | ICD-10-CM | POA: Insufficient documentation

## 2024-03-16 DIAGNOSIS — N390 Urinary tract infection, site not specified: Secondary | ICD-10-CM | POA: Insufficient documentation

## 2024-03-16 DIAGNOSIS — Z87891 Personal history of nicotine dependence: Secondary | ICD-10-CM | POA: Diagnosis not present

## 2024-03-16 DIAGNOSIS — I110 Hypertensive heart disease with heart failure: Secondary | ICD-10-CM | POA: Insufficient documentation

## 2024-03-16 DIAGNOSIS — Z951 Presence of aortocoronary bypass graft: Secondary | ICD-10-CM | POA: Insufficient documentation

## 2024-03-16 DIAGNOSIS — R0789 Other chest pain: Secondary | ICD-10-CM | POA: Insufficient documentation

## 2024-03-16 DIAGNOSIS — I2510 Atherosclerotic heart disease of native coronary artery without angina pectoris: Secondary | ICD-10-CM | POA: Diagnosis not present

## 2024-03-16 DIAGNOSIS — I517 Cardiomegaly: Secondary | ICD-10-CM | POA: Diagnosis not present

## 2024-03-16 DIAGNOSIS — M791 Myalgia, unspecified site: Secondary | ICD-10-CM | POA: Diagnosis not present

## 2024-03-16 DIAGNOSIS — I5022 Chronic systolic (congestive) heart failure: Secondary | ICD-10-CM | POA: Diagnosis not present

## 2024-03-16 DIAGNOSIS — I25810 Atherosclerosis of coronary artery bypass graft(s) without angina pectoris: Secondary | ICD-10-CM | POA: Diagnosis not present

## 2024-03-16 DIAGNOSIS — I700 Atherosclerosis of aorta: Secondary | ICD-10-CM | POA: Diagnosis not present

## 2024-03-16 DIAGNOSIS — Z79899 Other long term (current) drug therapy: Secondary | ICD-10-CM | POA: Diagnosis not present

## 2024-03-16 DIAGNOSIS — M7918 Myalgia, other site: Secondary | ICD-10-CM | POA: Diagnosis not present

## 2024-03-16 DIAGNOSIS — I5030 Unspecified diastolic (congestive) heart failure: Secondary | ICD-10-CM | POA: Insufficient documentation

## 2024-03-16 DIAGNOSIS — R079 Chest pain, unspecified: Secondary | ICD-10-CM | POA: Diagnosis not present

## 2024-03-16 LAB — URINALYSIS, ROUTINE W REFLEX MICROSCOPIC
Glucose, UA: 100 mg/dL — AB
Hgb urine dipstick: NEGATIVE
Ketones, ur: NEGATIVE mg/dL
Nitrite: NEGATIVE
Protein, ur: 30 mg/dL — AB
Specific Gravity, Urine: >1.03 — ABNORMAL HIGH (ref 1.005–1.030)
pH: 6 (ref 5.0–8.0)

## 2024-03-16 LAB — CBC
HCT: 36.5 % — ABNORMAL LOW (ref 39.0–52.0)
Hemoglobin: 12 g/dL — ABNORMAL LOW (ref 13.0–17.0)
MCH: 31.3 pg (ref 26.0–34.0)
MCHC: 32.9 g/dL (ref 30.0–36.0)
MCV: 95.3 fL (ref 80.0–100.0)
Platelets: 256 K/uL (ref 150–400)
RBC: 3.83 MIL/uL — ABNORMAL LOW (ref 4.22–5.81)
RDW: 14.2 % (ref 11.5–15.5)
WBC: 8.9 K/uL (ref 4.0–10.5)
nRBC: 0 % (ref 0.0–0.2)

## 2024-03-16 LAB — BASIC METABOLIC PANEL WITH GFR
Anion gap: 9 (ref 5–15)
BUN: 13 mg/dL (ref 8–23)
CO2: 20 mmol/L — ABNORMAL LOW (ref 22–32)
Calcium: 8.4 mg/dL — ABNORMAL LOW (ref 8.9–10.3)
Chloride: 110 mmol/L (ref 98–111)
Creatinine, Ser: 1.27 mg/dL — ABNORMAL HIGH (ref 0.61–1.24)
GFR, Estimated: 54 mL/min — ABNORMAL LOW (ref >60)
Glucose, Bld: 120 mg/dL — ABNORMAL HIGH (ref 70–99)
Potassium: 3.7 mmol/L (ref 3.5–5.1)
Sodium: 139 mmol/L (ref 135–145)

## 2024-03-16 LAB — URINALYSIS, MICROSCOPIC (REFLEX)

## 2024-03-16 LAB — TROPONIN I (HIGH SENSITIVITY)
Troponin I (High Sensitivity): 5 ng/L (ref <18)
Troponin I (High Sensitivity): 5 ng/L (ref <18)

## 2024-03-16 NOTE — ED Notes (Signed)
 Called patient twice in waiting area to take back for repeat troponin. No response. KIT

## 2024-03-16 NOTE — ED Notes (Signed)
 Attempted to call patient back for repeat troponin draw from waiting room. No response x2. RN aware. KIT

## 2024-03-16 NOTE — ED Notes (Addendum)
EKG completed upon arrival.

## 2024-03-16 NOTE — ED Triage Notes (Signed)
 Pt reports he thinks he is having a heart attack, c/o left arm pain that started yesterday.

## 2024-03-16 NOTE — ED Triage Notes (Addendum)
 C/O of left sided chest pain that started yesterday, radiates down left arm. Denies SHOB, n,v. Pt took 81mg  of aspirin .

## 2024-03-16 NOTE — ED Provider Triage Note (Signed)
 Emergency Medicine Provider Triage Evaluation Note  Alex [name] , a 89 y.o. male  was evaluated in triage.  Pt complains of cp. Report pain to L chest radaites to L arm since yesterday.  No fever, chills, cough, sob, nausea, vomiting, lighthead or dizzy.  Significant cardiac hx.    Review of Systems  Positive: As above Negative: As above  Physical Exam  BP (!) 155/67 (BP Location: Right Arm)   Pulse 77   Temp 97.7 F (36.5 C) (Oral)   Resp 19   Ht 5' 10 (1.778 m)   Wt 95.3 kg   SpO2 97%   BMI 30.13 kg/m  Gen:   Awake, no distress   Resp:  Normal effort  MSK:   Moves extremities without difficulty  Other:    Medical Decision Making  Medically screening exam initiated at 2:06 PM.  Appropriate orders placed.  Norleen JONELLE Novak was informed that the remainder of the evaluation will be completed by another provider, this initial triage assessment does not replace that evaluation, and the importance of remaining in the ED until their evaluation is complete.     Nivia Colon, PA-C 03/16/24 1407

## 2024-03-16 NOTE — Discharge Instructions (Signed)
 Thank you for letting us  evaluate you today.  Your cardiac enzymes were negative.  Do not think you are having a heart attack or any issues with your heart.  Your chest x-ray did not show any pneumonia nor fluid.  Please follow-up as recommended by PCP, cardiology  Return to Emergency Department if you experience chest pain, shortness of breath especially if these are exertional, worsening symptoms

## 2024-03-16 NOTE — Consult Note (Addendum)
 CARDIOLOGY CONSULT NOTE    Patient ID: Alex [name]; [id number]; 12-29-1931   Admit date: 03/16/2024 Date of Consult: 03/16/2024  Primary Care Provider: Gil Greig BRAVO, NP Primary Cardiologist:  Primary Electrophysiologist:     Patient Profile:   Alex [name] is a 89 y.o. male  who is being seen today for the evaluation of chest pain at the request of Dr Simon.  History of Present Illness:   Alex [name] is a 89 year old M known to have CAD s/p CABG in 2000, posterior STEMI in 2021 s/p unsuccessful attempt at opening SVG to OM and hence underwent LM-Lcx PCI with LVEF 45 to 50% presented to the ER with pain in the left armpit and left arm, intermittent, since yesterday.  Pain not similar to index pain when he had CABG in 2000 and PCI in 2021.  He reported that he uses his left arm most commonly to pull himself up in the bathroom.  He is minimally active at home.  Collateral history is obtained from the son at the bedside.  He lives with his son.  He has been watching TV a lot lately.  Does not have any other symptoms.  EKG upon arrival to the ER showed NSR, frequent PACs.  Troponins within normal limits x 2.  Past Medical History:  Diagnosis Date   Acquired deformity of nose    Anemia    Anxiety    Arthritis    Benign localized prostatic hyperplasia with lower urinary tract symptoms (LUTS)    Bilateral lower extremity edema    chronic   Bladder calculus    Cervicalgia    CHF (congestive heart failure) (HCC)    Ischemic CM   Chronic heart failure with preserved ejection fraction (HFpEF) (HCC)    followed by cardiology---- per last echo in epic   20-20-2021  EF ~45-50%   Complication of anesthesia    history post op urinary retension 02/ 2016 post op Left CEA   Emphysema lung (HCC)    per pt has advair prn   Essential hypertension    GERD (gastroesophageal reflux disease)    History of esophagitis    History of kidney stones    History of nonmelanoma skin cancer    History of  ST elevation myocardial infarction (STEMI) 06/22/2019   True posterior STEMI -> occluded SVG.  Native LM--LCX PCI w/ DES   Hyperlipidemia    Insomnia, unspecified    Internal hemorrhoids without mention of complication    Ischemic cardiomyopathy    last echo in epic 06-23-2019  ef 45-50%   Left carotid artery stenosis    S/P L CEA - 2015 (Dr. Serene)   Left ureteral calculus    Multiple vessel coronary artery disease 2000   cardiologist--- dr anner ;   cath 2000 ~50% LM-ostLAD with 85% ost-mid LCx, 80% ost RI, 100% CTO RCA -> CABG X 4 (LIMA-LAD, RIMA-dRCA, SVG-OM2, SVG-RI) (Dr. Kerrin) => 06/2019:  posterior STEMI : 100% SVG-OM2 (unable to open), 100% OM2; DES PCI LM-mLCx.   Myocardial infarction [facility]    STEMI 2021    DES x 1   Pre-diabetes    S/P drug eluting coronary stent placement 06/22/2019   PCI and DES x1  to LM -- mLCx   Wears glasses     Past Surgical History:  Procedure Laterality Date   COLONOSCOPY WITH ESOPHAGOGASTRODUODENOSCOPY (EGD)  2007   Dr Geroge   CORONARY ARTERY BYPASS GRAFT  09/11/1998   @  [facility] by Dr. GORMAN.  Hendrickson - CABG x 4:  LIMA-LAD, RIMA-dRCA, SVG-OM2, SVG-RI   CORONARY STENT INTERVENTION N/A 06/22/2019   Procedure: CORONARY STENT INTERVENTION;  Surgeon: Anner Alm ORN, MD;  Location: [facility] INVASIVE CV LAB; unsuccessful attempt at revascularization of SVG-OM2 => Synergy DES PCI from LM-mid LCx (Synergy DES 2.5 mm x 38 mm--> 3.1-2.6 mm); unfortunately OM2 was occluded.  This revascularized OM1, OM 3 and OM 4.   CORONARY/GRAFT ACUTE MI REVASCULARIZATION N/A 06/22/2019   Procedure: CORONARY/GRAFT ACUTE MI REVASCULARIZATION;  Surgeon: Anner Alm ORN, MD;  Location: [facility] INVASIVE CV LAB;; unsuccessful PTCA/aspiration thrombectomy of SVG-OM1 as culprit lesion for STEMI   CYSTOSCOPY WITH LITHOLAPAXY N/A 02/17/2023   Procedure: CYSTOSCOPY WITH LITHOLAPAXY;  Surgeon: Shane Steffan BROCKS, MD;  Location: [facility];  Service: Urology;  Laterality:  N/A;  60 MINUTES   CYSTOSCOPY WITH URETEROSCOPY AND STENT PLACEMENT Left 03/03/2023   Procedure: CYSTOSCOPY WITH LEFT ANTEGRADE URETEROSCOPY AND ANTEGRADE STENT PLACEMENT;  Surgeon: Shane Steffan BROCKS, MD;  Location: [facility];  Service: Urology;  Laterality: Left;  90 MINS   CYSTOSCOPY/URETEROSCOPY/HOLMIUM LASER/STENT PLACEMENT Left 02/17/2023   Procedure: CYSTOSCOPY LEFT URETEROSCOPY, ATTEMPTED;  Surgeon: Shane Steffan BROCKS, MD;  Location: [facility];  Service: Urology;  Laterality: Left;   ENDARTERECTOMY Left 06/14/2014   Procedure: LEFT CAROTID ENDARTERECTOMY WITH VASCUGUARD PATCH ANGIOPLASTY;  Surgeon: Gaile ORN New, MD;  Location: [facility];  Service: Vascular;  Laterality: Left;   EXCISIONAL HEMORRHOIDECTOMY  1981   EXTRACORPOREAL SHOCK WAVE LITHOTRIPSY  2005   INCISION AND DRAINAGE Right 06/06/2016   @[facility]    right mandible area for abscess   IR NEPHROSTOMY PLACEMENT LEFT  02/17/2023   LEFT HEART CATH AND CORONARY ANGIOGRAPHY  2000   MV CAD --> CABG x 4   LEFT HEART CATH AND CORS/GRAFTS ANGIOGRAPHY N/A 06/22/2019   Procedure: LEFT HEART CATH AND CORS/GRAFTS ANGIOGRAPHY;  Surgeon: Anner Alm ORN, MD;  CULPRIT LESION = 100% SVG-OM2; Severe native CAD: mLM-Ost LAD-50% w/ Ost LCx 85% (DES PCI OM-OstLCx)- Treated b/c 100% TO SVG-OM2 as Culprit lesion - unable to open. OM2 100%. Ost RI 80%, Ost-mid LAD 50% - 100% mLAD after D1; ost RI 80%, mid RCA 100%; Patent SVG-RI, pedLIMA-dLAD, pedRIMA-dRCA; EF 45-50%   MULTIPLE TOOTH EXTRACTIONS  06/01/2016   @[facility]     due to abscesses   TONSILLECTOMY  1940   TRANSTHORACIC ECHOCARDIOGRAM  06/23/2019   Inferolateral STEMI (occluded SVG-OM2): (Definity ) mildly reduced LV function-EF 45 to 50%.  GR 1 DD.  No obvious R WMA.  Essentially normal valves.  Mild MR.       Inpatient Medications: Scheduled Meds:  Continuous Infusions:  PRN Meds:   Allergies:   No Known Allergies  Social History:   Social History   Socioeconomic  History   Marital status: Widowed    Spouse name: Not on file   Number of children: Not on file   Years of education: Not on file   Highest education level: Not on file  Occupational History   Not on file  Tobacco Use   Smoking status: Former    Current packs/day: 0.00    Average packs/day: 2.0 packs/day for 25.0 years (50.0 ttl pk-yrs)    Types: Cigarettes    Start date: 05/03/1948    Quit date: 05/03/1973    Years since quitting: 50.9    Passive exposure: Never   Smokeless tobacco: Never  Vaping Use   Vaping status: Never Used  Substance and  Sexual Activity   Alcohol use: Not Currently    Alcohol/week: 1.0 - 2.0 standard drink of alcohol    Types: 1 - 2 Glasses of wine per week    Comment: occasional   Drug use: Never   Sexual activity: Not Currently  Other Topics Concern   Not on file  Social History Narrative   Not on file   Social Drivers of Health   Financial Resource Strain: Low Risk  (05/12/2023)   Overall Financial Resource Strain (CARDIA)    Difficulty of Paying Living Expenses: Not hard at all  Food Insecurity: No Food Insecurity (05/12/2023)   Hunger Vital Sign    Worried About Running Out of Food in the Last Year: Never true    Ran Out of Food in the Last Year: Never true  Transportation Needs: No Transportation Needs (05/12/2023)   PRAPARE - Administrator, Civil Service (Medical): No    Lack of Transportation (Non-Medical): No  Physical Activity: Insufficiently Active (05/12/2023)   Exercise Vital Sign    Days of Exercise per Week: 7 days    Minutes of Exercise per Session: 20 min  Stress: Stress Concern Present (05/12/2023)   [employer] of Occupational Health - Occupational Stress Questionnaire    Feeling of Stress : To some extent  Social Connections: Socially Isolated (05/12/2023)   Social Connection and Isolation Panel    Frequency of Communication with Friends and Family: More than three times a week    Frequency of Social Gatherings with  Friends and Family: More than three times a week    Attends Religious Services: Never    Database Administrator or Organizations: No    Attends Banker Meetings: Never    Marital Status: Widowed  Intimate Partner Violence: Not At Risk (05/12/2023)   Humiliation, Afraid, Rape, and Kick questionnaire    Fear of Current or Ex-Partner: No    Emotionally Abused: No    Physically Abused: No    Sexually Abused: No    Family History:    Family History  Problem Relation Age of Onset   Cancer Mother        LIVER   Cancer Brother        PROSTATE   Cancer Daughter      ROS:  Please see the history of present illness.  ROS  All other ROS reviewed and negative.     Physical Exam/Data:   Vitals:   03/16/24 1401 03/16/24 1618 03/16/24 1803 03/16/24 2038  BP:  (!) 131/57 (!) 165/89 (!) 166/74  Pulse:  62 66 (!) 59  Resp:  18 18 18   Temp:  98.1 F (36.7 C)  97.8 F (36.6 C)  TempSrc:  Oral  Oral  SpO2:  98% 100% 99%  Weight: 95.3 kg     Height: 5' 10 (1.778 m)      No intake or output data in the 24 hours ending 03/16/24 2101 Filed Weights   03/16/24 1401  Weight: 95.3 kg   Body mass index is 30.13 kg/m.  General:  Well nourished, well developed, in no acute distress HEENT: normal Lymph: no adenopathy Neck: no JVD Endocrine:  No thryomegaly Vascular: No carotid bruits; FA pulses 2+ bilaterally without bruits  Cardiac:  normal S1, S2; RRR; no murmur  Lungs:  clear to auscultation bilaterally, no wheezing, rhonchi or rales  Abd: soft, nontender, no hepatomegaly  Ext: no edema Musculoskeletal:  No deformities, BUE and BLE strength normal  and equal Skin: warm and dry  Neuro:  CNs 2-12 intact, no focal abnormalities noted Psych:  Normal affect    Laboratory Data:  Chemistry Recent Labs  Lab 03/16/24 1427  NA 139  K 3.7  CL 110  CO2 20*  GLUCOSE 120*  BUN 13  CREATININE 1.27*  CALCIUM  8.4*  GFRNONAA 54*  ANIONGAP 9    No results for input(s):  PROT, ALBUMIN , AST, ALT, ALKPHOS, BILITOT in the last 168 hours. Hematology Recent Labs  Lab 03/16/24 1427  WBC 8.9  RBC 3.83*  HGB 12.0*  HCT 36.5*  MCV 95.3  MCH 31.3  MCHC 32.9  RDW 14.2  PLT 256   Cardiac EnzymesNo results for input(s): TROPONINI in the last 168 hours. No results for input(s): TROPIPOC in the last 168 hours.  BNPNo results for input(s): BNP, PROBNP in the last 168 hours.  DDimer No results for input(s): DDIMER in the last 168 hours.  Radiology/Studies:  DG Chest 2 View Result Date: 03/16/2024 EXAM: 2 VIEW(S) XRAY OF THE CHEST 03/16/2024 02:54:00 PM COMPARISON: 06/22/2019 CLINICAL HISTORY: CP FINDINGS: LUNGS AND PLEURA: No focal pulmonary opacity. No pleural effusion. No pneumothorax. HEART AND MEDIASTINUM: Mild cardiomegaly. Aortic atherosclerosis. CABG markers with surgical clips and sternotomy wires. BONES AND SOFT TISSUES: Multilevel degenerative disc disease of the spine. IMPRESSION: 1. Mild cardiomegaly. Otherwise, no acute cardiopulmonary abnormality. Electronically signed by: Rogelia Myers MD 03/16/2024 03:14 PM EST RP Workstation: [id number]    Assessment and Plan:   Musculoskeletal pain - Patient denies any chest pain. He reports having pain in his left armpit and left arm since yesterday, intermittent.  He uses his left arm frequently to pull himself up in the bathroom.  Resolved after coming to the ER.  This pain is not similar to index pain when he had CABG in 2000 and posterior STEMI in 2021.  EKG showed NSR, frequent PACs.  Troponins within normal limits x 2.  He also reports new onset pain in the left side of his chest after electrodes were placed in the ER.  Pain is reproducible on palpation, musculoskeletal as well.  No further cardiac workup at this time.  CAD s/p CABG in 2000, STEMI in 2021 s/p LM-Lcx PCI (due to unsuccessful attempt in opening SVG to OM) - Continue Plavix  75 mg once daily, rosuvastatin  40 mg nightly. -  Continue metoprolol  succinate 50 mg once daily, ramipril  5 mg once daily.  Chronic systolic heart failure - Compensated.  Prior echo from 2021 showed LVEF 45 to 50%. - Continue metoprolol  succinate 50 mg once daily, ramipril  5 mg once daily.  Disposition: Per ER.  60 minutes spent in reviewing prior medical records, specialist notes, more than 3 labs, discussion and documentation.  For questions or updates, please contact [facility] Please consult [email or web address] for contact info under Cardiology/STEMI.   Signed, Darian Ace Priya Kellan Raffield, MD 03/16/2024 9:01 PM

## 2024-03-16 NOTE — ED Provider Notes (Signed)
 [place] EMERGENCY DEPARTMENT AT [facility] Provider Note   CSN: [id number] Arrival date & time: 03/16/24  1332     Patient presents with: No chief complaint on file.   Alex [name] is a 89 y.o. male with a past medical history of BPH, anemia, emphysema, kidney stone, CAD, STEMI (2021), CABG x 4 (2000), HTN, HLD, carotid artery stenosis s/p CEA (2016), chronic BLE edema, ischemic cardiomyopathy, HFpEF presents to emergency department for evaluation of left-sided chest pain that radiated to left arm that started last evening.  Pain is intermittent and occurs every minute.  He is unsure what he was doing when chest pain started.  Does not believe that it is exertional.  No shortness of breath.  No recent falls or trauma. No reproducible pain when moving LUE. Currently on plavix  for CAD and took 81 mg ASA prior to arrival. Reports improvement of pain since arrival to ED. Does not feel similar to previous MI.  Last saw cardiology in 2024. Following Dr. Alm Clay. Most recent echo 2021.  LVEF 45-50%. LHC 2021 w/ PCI to LCx   HPI     Prior to Admission medications   Medication Sig Start Date End Date Taking? Authorizing Provider  cephALEXin  (KEFLEX ) 500 MG capsule Take 1 capsule (500 mg total) by mouth 4 (four) times daily. 03/16/24  Yes Minnie Tinnie BRAVO, PA  acetaminophen  (TYLENOL ) 500 MG tablet Take 1-2 tablets (500-1,000 mg total) by mouth 3 (three) times daily as needed. 06/13/23   Fargo, Amy E, NP  Alpha-Lipoic Acid 200 MG CAPS Take 200 mg by mouth.    [provider]  ascorbic acid  (VITAMIN C ) 500 MG tablet Take 500-1,000 mg by mouth daily.    [provider]  b complex vitamins tablet Take 1 tablet by mouth daily.    [provider]  Boswellia Serrata (BOSWELLIA PO) Take 800 mg by mouth.    [provider]  Cholecalciferol  (VITAMIN D3) 125 MCG (5000 UT) CAPS Take 5,000 Units by mouth daily.    [provider]  Cinnamon 500 MG  TABS Take 500 mg by mouth 3 (three) times a week.     [provider]  CITRUS BERGAMOT PO Take 1 capsule by mouth 2 (two) times a week.    [provider]  clopidogrel  (PLAVIX ) 75 MG tablet Take 1 tablet (75 mg total) by mouth daily. 02/23/24   Fargo, Amy E, NP  Coenzyme Q10 (CO Q 10) 100 MG CAPS Take 100 mg by mouth daily.     [provider]  Cyanocobalamin (VITAMIN B-12 PO) Take 1 tablet by mouth daily.    [provider]  diazepam  (VALIUM ) 5 MG tablet Take 0.5 tablets (2.5 mg total) by mouth at bedtime as needed for anxiety. 02/23/24   Fargo, Amy E, NP  doxepin  (SINEQUAN ) 75 MG capsule Take 1 capsule (75 mg total) by mouth daily. 02/23/24   Fargo, Amy E, NP  finasteride  (PROSCAR ) 5 MG tablet Take 1 tablet (5 mg total) by mouth daily. 02/23/24   Fargo, Amy E, NP  Fluticasone -Salmeterol (ADVAIR) 250-50 MCG/DOSE AEPB Inhale 1 puff into the lungs daily as needed.    [provider]  Grape Seed OIL Take 1 capsule by mouth daily.    [provider]  metoprolol  succinate (TOPROL -XL) 50 MG 24 hr tablet TAKE 1 TABLET BY MOUTH DAILY WITH OR IMMEDIATELY FOLLOWING A MEAL 02/23/24   Fargo, Amy E, NP  nitroGLYCERIN  (NITROSTAT ) 0.4 MG  SL tablet Place 1 tablet (0.4 mg total) under the tongue every 5 (five) minutes as needed. 06/25/19   Anner Alm ORN, MD  ramipril  (ALTACE ) 5 MG capsule Take 1 capsule (5 mg total) by mouth daily. 03/09/24   Fargo, Amy E, NP  RESVERATROL 100 MG CAPS Take 100 mg by mouth daily.     [provider]  rosuvastatin  (CRESTOR ) 40 MG tablet Take 1 tablet (40 mg total) by mouth daily. 02/23/24   Fargo, Amy E, NP  tamsulosin  (FLOMAX ) 0.4 MG CAPS capsule Take 1 capsule (0.4 mg total) by mouth daily after supper. 02/23/24   Fargo, Amy E, NP  TURMERIC PO Take 1 capsule by mouth as needed.     [provider]    Allergies: Patient has no known allergies.    Review of Systems  Respiratory:  Negative for shortness of  breath.     Updated Vital Signs BP (!) 166/74 (BP Location: Right Arm)   Pulse (!) 59   Temp 97.8 F (36.6 C) (Oral)   Resp 18   Ht 5' 10 (1.778 m)   Wt 95.3 kg   SpO2 99%   BMI 30.13 kg/m   Physical Exam Vitals and nursing note reviewed.  Constitutional:      General: He is not in acute distress.    Appearance: Normal appearance.  HENT:     Head: Normocephalic and atraumatic.  Eyes:     Conjunctiva/sclera: Conjunctivae normal.  Cardiovascular:     Rate and Rhythm: Normal rate.  Pulmonary:     Effort: Pulmonary effort is normal. No respiratory distress.     Breath sounds: Normal breath sounds.  Musculoskeletal:     Right lower leg: 1+ Pitting Edema present.     Left lower leg: 1+ Pitting Edema present.  Skin:    Coloration: Skin is not jaundiced or pale.  Neurological:     Mental Status: He is alert. Mental status is at baseline.     (all labs ordered are listed, but only abnormal results are displayed) Labs Reviewed  BASIC METABOLIC PANEL WITH GFR - Abnormal; Notable for the following components:      Result Value   CO2 20 (*)    Glucose, Bld 120 (*)    Creatinine, Ser 1.27 (*)    Calcium  8.4 (*)    GFR, Estimated 54 (*)    All other components within normal limits  CBC - Abnormal; Notable for the following components:   RBC 3.83 (*)    Hemoglobin 12.0 (*)    HCT 36.5 (*)    All other components within normal limits  URINALYSIS, ROUTINE W REFLEX MICROSCOPIC - Abnormal; Notable for the following components:   Specific Gravity, Urine >1.030 (*)    Glucose, UA 100 (*)    Bilirubin Urine SMALL (*)    Protein, ur 30 (*)    Leukocytes,Ua MODERATE (*)    All other components within normal limits  URINALYSIS, MICROSCOPIC (REFLEX) - Abnormal; Notable for the following components:   Bacteria, UA MANY (*)    All other components within normal limits  URINE CULTURE  TROPONIN I (HIGH SENSITIVITY)  TROPONIN I (HIGH SENSITIVITY)    EKG: None  Radiology: DG  Chest 2 View Result Date: 03/16/2024 EXAM: 2 VIEW(S) XRAY OF THE CHEST 03/16/2024 02:54:00 PM COMPARISON: 06/22/2019 CLINICAL HISTORY: CP FINDINGS: LUNGS AND PLEURA: No focal pulmonary opacity. No pleural effusion. No pneumothorax. HEART AND MEDIASTINUM: Mild cardiomegaly. Aortic atherosclerosis. CABG markers with surgical  clips and sternotomy wires. BONES AND SOFT TISSUES: Multilevel degenerative disc disease of the spine. IMPRESSION: 1. Mild cardiomegaly. Otherwise, no acute cardiopulmonary abnormality. Electronically signed by: Rogelia Myers MD 03/16/2024 03:14 PM EST RP Workstation: [id number]    Medications Ordered in the ED - No data to display                                  Medical Decision Making Amount and/or Complexity of Data Reviewed Labs: ordered.  Risk Prescription drug management.     Patient presents to the ED for concern of chest pain, this involves an extensive number of treatment options, and is a complaint that carries with it a high risk of complications and morbidity.  The differential diagnosis includes ACS, dissection, pneumonia, fluid overload, PE, MSK, traumatic injury.  Nonexhaustive list   Co morbidities that complicate the patient evaluation  Significant cardiac history to include CABG   Additional history obtained:  Additional history obtained from Nursing and Outside Medical Records   External records from outside source obtained and reviewed including triage note, cardiology note from 2024, echo from 2021   Lab Tests:  I Ordered, and personally interpreted labs.  The pertinent results include:   Troponin negative x 2 CBG 120 Creatinine 1.27 per baseline Hgb 12   Imaging Studies ordered:  I ordered imaging studies including CXR  I independently visualized and interpreted imaging which showed mild cardiomegaly I agree with the radiologist interpretation   Cardiac Monitoring:  The patient was maintained on a cardiac monitor.  I  personally viewed and interpreted the cardiac monitored which showed an underlying rhythm of: NSR at 68bpm with no acute changes     Consultations Obtained:  I requested consultation with cardiology Dr. Mallipeddi,  and discussed lab and imaging findings as well as pertinent plan - they recommend:  Reassuring cardiac workup and suspected MSK pain. No additional cardiac workup needed at this time   Problem List / ED Course:  CP Nonexertional.  Was unable to reproduce pain.  Had patient flex and extend left shoulder.  Palpated chest wall with no tenderness. No complaints of shortness of breath.  No hypoxia nor tachycardia.  Low suspicion for PE No complaints of pain radiation into back nor neurocomplaints.  No difference in blood pressure in each upper extremity.  Low suspicion for dissection. Troponin negative. Delta negative CXR wo effusion. Does not appear fluid overloaded Reports pain has improved significantly since being in ED Did consult cardiology who did not recommend any further cardiac workup at this time with reassuring cardiac workup.    UTI Was informed by nursing staff that pt had very cloudy foul odor Pt denies all urinary symptoms. No flank pain. No fever nor leuko in ED. Does wear depends at baseline for urinary incontinence Low suspicion of pyelo-, kidney stone UA notable for UTI. Prescribed keflex  and sent culture. Will have pt f/u with his urologist as needed   Reevaluation:  After the interventions noted above, I reevaluated the patient and found that they have :improved   Dispostion:  After consideration of the diagnostic results and the patients response to treatment, I feel that the patent would benefit from outpatient management with cardiology follow-up as scheduled.   Discussed ED workup, disposition, return to ED precautions with patient who expresses understanding agrees with plan.  All questions answered to their satisfaction.  They are agreeable to  plan.  Discharge  instructions provided on paperwork  Final diagnoses:  Musculoskeletal pain  Chest wall pain  Urinary tract infection without hematuria, site unspecified    ED Discharge Orders          Ordered    cephALEXin  (KEFLEX ) 500 MG capsule  4 times daily        03/17/24 0000             Minnie Tinnie BRAVO, PA 03/17/24 0015    Simon Lavonia SAILOR, MD 03/21/24 [phone]

## 2024-03-17 MED ORDER — CEPHALEXIN 500 MG PO CAPS: 500.0000 mg | ORAL_CAPSULE | Freq: Four times a day (QID) | ORAL | 0 refills | Status: DC

## 2024-03-26 ENCOUNTER — Other Ambulatory Visit

## 2024-03-26 ENCOUNTER — Other Ambulatory Visit: Payer: Self-pay

## 2024-03-26 DIAGNOSIS — R339 Retention of urine, unspecified: Secondary | ICD-10-CM | POA: Diagnosis not present

## 2024-03-27 LAB — URINALYSIS, COMPLETE
Bacteria, UA: NONE SEEN /HPF
Bilirubin Urine: NEGATIVE
Glucose, UA: NEGATIVE
Hgb urine dipstick: NEGATIVE
Hyaline Cast: NONE SEEN /LPF
Ketones, ur: NEGATIVE
Nitrite: NEGATIVE
Specific Gravity, Urine: 1.014 (ref 1.001–1.035)
pH: 5.5 (ref 5.0–8.0)

## 2024-03-27 LAB — URINE CULTURE
MICRO NUMBER:: 17276931
Result:: NO GROWTH
SPECIMEN QUALITY:: ADEQUATE

## 2024-03-28 ENCOUNTER — Other Ambulatory Visit: Payer: Self-pay

## 2024-03-28 ENCOUNTER — Ambulatory Visit: Payer: Self-pay | Admitting: Nurse Practitioner

## 2024-05-17 ENCOUNTER — Ambulatory Visit: Payer: Self-pay | Admitting: Orthopedic Surgery

## 2024-05-17 ENCOUNTER — Encounter: Payer: Self-pay | Admitting: Orthopedic Surgery

## 2024-05-17 VITALS — BP 126/70 | HR 65 | Temp 97.0°F | Ht 70.0 in | Wt 208.2 lb

## 2024-05-17 DIAGNOSIS — I10 Essential (primary) hypertension: Secondary | ICD-10-CM | POA: Diagnosis not present

## 2024-05-17 DIAGNOSIS — N401 Enlarged prostate with lower urinary tract symptoms: Secondary | ICD-10-CM

## 2024-05-17 DIAGNOSIS — Z0000 Encounter for general adult medical examination without abnormal findings: Secondary | ICD-10-CM | POA: Diagnosis not present

## 2024-05-17 DIAGNOSIS — R635 Abnormal weight gain: Secondary | ICD-10-CM

## 2024-05-17 DIAGNOSIS — N138 Other obstructive and reflux uropathy: Secondary | ICD-10-CM

## 2024-05-17 DIAGNOSIS — R7303 Prediabetes: Secondary | ICD-10-CM

## 2024-05-17 DIAGNOSIS — E559 Vitamin D deficiency, unspecified: Secondary | ICD-10-CM | POA: Diagnosis not present

## 2024-05-17 NOTE — Progress Notes (Signed)
 "  Chief Complaint  Patient presents with   medicare annual wellness     Patient presents today for annual exam.      Subjective:   Alex [name] is a 89 y.o. male who presents for a Medicare Annual Wellness Visit.  Visit info / Clinical Intake: Medicare Wellness Visit Type:: Subsequent Annual Wellness Visit Persons participating in visit and providing information:: patient & caregiver Medicare Wellness Visit Mode:: In-person (required for WTM) Interpreter Needed?: No Pre-visit prep was completed: no AWV questionnaire completed by patient prior to visit?: no Living arrangements:: with family/others Patient's Overall Health Status Rating: good Typical amount of pain: some (Neck) Does pain affect daily life?: no Are you currently prescribed opioids?: no  Dietary Habits and Nutritional Risks How many meals a day?: 3 Eats fruit and vegetables daily?: yes (Little) Most meals are obtained by: preparing own meals In the last 2 weeks, have you had any of the following?: none Diabetic:: no  Functional Status Activities of Daily Living (to include ambulation/medication): Independent Ambulation: Independent Medication Administration: Independent Home Management (perform basic housework or laundry): Needs assistance (comment) Manage your own finances?: yes Primary transportation is: driving Concerns about vision?: (!) yes Concerns about hearing?: (!) yes Uses hearing aids?: no Hear whispered voice?: (!) no *in-person visit only*  Fall Screening Falls in the past year?: 0 Number of falls in past year: 0 Was there an injury with Fall?: 0 Fall Risk Category Calculator: 0 Patient Fall Risk Level: Low Fall Risk  Fall Risk Patient at Risk for Falls Due to: No Fall Risks Fall risk Follow up: Falls evaluation completed  Home and Transportation Safety: All rugs have non-skid backing?: yes All stairs or steps have railings?: yes Grab bars in the bathtub or shower?: yes Have non-skid  surface in bathtub or shower?: yes Good home lighting?: yes Regular seat belt use?: yes Hospital stays in the last year:: no  Cognitive Assessment Difficulty concentrating, remembering, or making decisions? : yes Will 6CIT or Mini Cog be Completed: yes What year is it?: 0 points What month is it?: 0 points Give patient an address phrase to remember (5 components): [address] [employer] [place]  About what time is it?: 0 points Count backwards from 20 to 1: 0 points Say the months of the year in reverse: 4 points Repeat the address phrase from earlier: 6 points 6 CIT Score: 10 points  Advance Directives (For Healthcare) Does Patient Have a Medical Advance Directive?: Unable to assess, patient is non-responsive or altered mental status  Reviewed/Updated  Reviewed/Updated: Reviewed All (Medical, Surgical, Family, Medications, Allergies, Care Teams, Patient Goals); Medical History; Surgical History; Family History; Medications; Allergies    Allergies (verified) Patient has no known allergies.   Current Medications (verified) Outpatient Encounter Medications as of 05/17/2024  Medication Sig   acetaminophen  (TYLENOL ) 500 MG tablet Take 1-2 tablets (500-1,000 mg total) by mouth 3 (three) times daily as needed.   ascorbic acid  (VITAMIN C ) 500 MG tablet Take 500-1,000 mg by mouth daily.   b complex vitamins tablet Take 1 tablet by mouth daily.   Barberry-Oreg Grape-Goldenseal (BERBERINE COMPLEX PO) Take 1 tablet by mouth daily.   Boswellia Serrata (BOSWELLIA PO) Take 800 mg by mouth.   clopidogrel  (PLAVIX ) 75 MG tablet Take 1 tablet (75 mg total) by mouth daily.   Coenzyme Q10 (CO Q 10) 100 MG CAPS Take 100 mg by mouth daily.    Cyanocobalamin (VITAMIN B-12 PO) Take 1 tablet by mouth daily.  diazepam  (VALIUM ) 5 MG tablet Take 0.5 tablets (2.5 mg total) by mouth at bedtime as needed for anxiety.   doxepin  (SINEQUAN ) 75 MG capsule Take 1 capsule (75 mg total) by mouth daily.    finasteride  (PROSCAR ) 5 MG tablet Take 1 tablet (5 mg total) by mouth daily.   Fluticasone -Salmeterol (ADVAIR) 250-50 MCG/DOSE AEPB Inhale 1 puff into the lungs daily as needed.   Grape Seed OIL Take 1 capsule by mouth daily.   metoprolol  succinate (TOPROL -XL) 50 MG 24 hr tablet TAKE 1 TABLET BY MOUTH DAILY WITH OR IMMEDIATELY FOLLOWING A MEAL   Misc Natural Products (PROSTATE HEALTH PO) Take 1 Capful by mouth daily.   nitroGLYCERIN  (NITROSTAT ) 0.4 MG SL tablet Place 1 tablet (0.4 mg total) under the tongue every 5 (five) minutes as needed.   ramipril  (ALTACE ) 5 MG capsule Take 1 capsule (5 mg total) by mouth daily.   RESVERATROL 100 MG CAPS Take 100 mg by mouth daily.    rosuvastatin  (CRESTOR ) 40 MG tablet Take 1 tablet (40 mg total) by mouth daily.   selenium 50 MCG TABS tablet Take 200 mcg by mouth daily.   TURMERIC PO Take 1 capsule by mouth as needed.    tamsulosin  (FLOMAX ) 0.4 MG CAPS capsule Take 1 capsule (0.4 mg total) by mouth daily after supper.   [DISCONTINUED] Alpha-Lipoic Acid 200 MG CAPS Take 200 mg by mouth. (Patient not taking: Reported on 05/17/2024)   [DISCONTINUED] cephALEXin  (KEFLEX ) 500 MG capsule Take 1 capsule (500 mg total) by mouth 4 (four) times daily. (Patient not taking: Reported on 05/17/2024)   [DISCONTINUED] Cholecalciferol  (VITAMIN D3) 125 MCG (5000 UT) CAPS Take 5,000 Units by mouth daily. (Patient not taking: Reported on 05/17/2024)   [DISCONTINUED] Cinnamon 500 MG TABS Take 500 mg by mouth 3 (three) times a week.  (Patient not taking: Reported on 05/17/2024)   [DISCONTINUED] CITRUS BERGAMOT PO Take 1 capsule by mouth 2 (two) times a week. (Patient not taking: Reported on 05/17/2024)   No facility-administered encounter medications on file as of 05/17/2024.    History: Past Medical History:  Diagnosis Date   Acquired deformity of nose    Anemia    Anxiety    Arthritis    Benign localized prostatic hyperplasia with lower urinary tract symptoms (LUTS)     Bilateral lower extremity edema    chronic   Bladder calculus    Cervicalgia    CHF (congestive heart failure) (HCC)    Ischemic CM   Chronic heart failure with preserved ejection fraction (HFpEF) (HCC)    followed by cardiology---- per last echo in epic   20-20-2021  EF ~45-50%   Complication of anesthesia    history post op urinary retension 02/ 2016 post op Left CEA   Emphysema lung (HCC)    per pt has advair prn   Essential hypertension    GERD (gastroesophageal reflux disease)    History of esophagitis    History of kidney stones    History of nonmelanoma skin cancer    History of ST elevation myocardial infarction (STEMI) 06/22/2019   True posterior STEMI -> occluded SVG.  Native LM--LCX PCI w/ DES   Hyperlipidemia    Insomnia, unspecified    Internal hemorrhoids without mention of complication    Ischemic cardiomyopathy    last echo in epic 06-23-2019  ef 45-50%   Left carotid artery stenosis    S/P L CEA - 2015 (Dr. Serene)   Left ureteral calculus  Multiple vessel coronary artery disease 2000   cardiologist--- dr anner ;   cath 2000 ~50% LM-ostLAD with 85% ost-mid LCx, 80% ost RI, 100% CTO RCA -> CABG X 4 (LIMA-LAD, RIMA-dRCA, SVG-OM2, SVG-RI) (Dr. Kerrin) => 06/2019:  posterior STEMI : 100% SVG-OM2 (unable to open), 100% OM2; DES PCI LM-mLCx.   Myocardial infarction [facility])    STEMI 2021    DES x 1   Pre-diabetes    S/P drug eluting coronary stent placement 06/22/2019   PCI and DES x1  to LM -- mLCx   Wears glasses    Past Surgical History:  Procedure Laterality Date   COLONOSCOPY WITH ESOPHAGOGASTRODUODENOSCOPY (EGD)  2007   Dr Geroge   CORONARY ARTERY BYPASS GRAFT  09/11/1998   @ [facility] by Dr. GORMAN.  Hendrickson - CABG x 4:  LIMA-LAD, RIMA-dRCA, SVG-OM2, SVG-RI   CORONARY STENT INTERVENTION N/A 06/22/2019   Procedure: CORONARY STENT INTERVENTION;  Surgeon: Anner Alm ORN, MD;  Location: [facility] INVASIVE CV LAB; unsuccessful attempt at revascularization of SVG-OM2 =>  Synergy DES PCI from LM-mid LCx (Synergy DES 2.5 mm x 38 mm--> 3.1-2.6 mm); unfortunately OM2 was occluded.  This revascularized OM1, OM 3 and OM 4.   CORONARY/GRAFT ACUTE MI REVASCULARIZATION N/A 06/22/2019   Procedure: CORONARY/GRAFT ACUTE MI REVASCULARIZATION;  Surgeon: Anner Alm ORN, MD;  Location: [facility] INVASIVE CV LAB;; unsuccessful PTCA/aspiration thrombectomy of SVG-OM1 as culprit lesion for STEMI   CYSTOSCOPY WITH LITHOLAPAXY N/A 02/17/2023   Procedure: CYSTOSCOPY WITH LITHOLAPAXY;  Surgeon: Shane Steffan BROCKS, MD;  Location: [facility];  Service: Urology;  Laterality: N/A;  60 MINUTES   CYSTOSCOPY WITH URETEROSCOPY AND STENT PLACEMENT Left 03/03/2023   Procedure: CYSTOSCOPY WITH LEFT ANTEGRADE URETEROSCOPY AND ANTEGRADE STENT PLACEMENT;  Surgeon: Shane Steffan BROCKS, MD;  Location: [facility];  Service: Urology;  Laterality: Left;  90 MINS   CYSTOSCOPY/URETEROSCOPY/HOLMIUM LASER/STENT PLACEMENT Left 02/17/2023   Procedure: CYSTOSCOPY LEFT URETEROSCOPY, ATTEMPTED;  Surgeon: Shane Steffan BROCKS, MD;  Location: [facility];  Service: Urology;  Laterality: Left;   ENDARTERECTOMY Left 06/14/2014   Procedure: LEFT CAROTID ENDARTERECTOMY WITH VASCUGUARD PATCH ANGIOPLASTY;  Surgeon: Gaile ORN New, MD;  Location: [facility];  Service: Vascular;  Laterality: Left;   EXCISIONAL HEMORRHOIDECTOMY  1981   EXTRACORPOREAL SHOCK WAVE LITHOTRIPSY  2005   INCISION AND DRAINAGE Right 06/06/2016   @[facility]    right mandible area for abscess   IR NEPHROSTOMY PLACEMENT LEFT  02/17/2023   LEFT HEART CATH AND CORONARY ANGIOGRAPHY  2000   [facility] CAD --> CABG x 4   LEFT HEART CATH AND CORS/GRAFTS ANGIOGRAPHY N/A 06/22/2019   Procedure: LEFT HEART CATH AND CORS/GRAFTS ANGIOGRAPHY;  Surgeon: Anner Alm ORN, MD;  CULPRIT LESION = 100% SVG-OM2; Severe native CAD: mLM-Ost LAD-50% w/ Ost LCx 85% (DES PCI OM-OstLCx)- Treated b/c 100% TO SVG-OM2 as Culprit lesion - unable to open. OM2 100%. Ost RI  80%, Ost-mid LAD 50% - 100% mLAD after D1; ost RI 80%, mid RCA 100%; Patent SVG-RI, pedLIMA-dLAD, pedRIMA-dRCA; EF 45-50%   MULTIPLE TOOTH EXTRACTIONS  06/01/2016   @[facility]     due to abscesses   TONSILLECTOMY  1940   TRANSTHORACIC ECHOCARDIOGRAM  06/23/2019   Inferolateral STEMI (occluded SVG-OM2): (Definity ) mildly reduced LV function-EF 45 to 50%.  GR 1 DD.  No obvious R WMA.  Essentially normal valves.  Mild MR.   Family History  Problem Relation Age of Onset   Cancer Mother        LIVER  Cancer Brother        PROSTATE   Cancer Daughter    Social History   Occupational History   Not on file  Tobacco Use   Smoking status: Former    Current packs/day: 0.00    Average packs/day: 2.0 packs/day for 25.0 years (50.0 ttl pk-yrs)    Types: Cigarettes    Start date: 05/03/1948    Quit date: 05/03/1973    Years since quitting: 51.0    Passive exposure: Never   Smokeless tobacco: Never  Vaping Use   Vaping status: Never Used  Substance and Sexual Activity   Alcohol use: Not Currently    Alcohol/week: 1.0 - 2.0 standard drink of alcohol    Types: 1 - 2 Glasses of wine per week    Comment: occasional   Drug use: Never   Sexual activity: Not Currently   Tobacco Counseling Counseling given: Not Answered  SDOH Screenings   Food Insecurity: No Food Insecurity (05/12/2023)  Housing: Low Risk (05/12/2023)  Transportation Needs: No Transportation Needs (05/12/2023)  Utilities: Not At Risk (05/12/2023)  Alcohol Screen: Low Risk (05/12/2023)  Depression (PHQ2-9): Low Risk (05/17/2024)  Financial Resource Strain: Low Risk (05/12/2023)  Physical Activity: Insufficiently Active (05/12/2023)  Social Connections: Socially Isolated (05/12/2023)  Stress: Stress Concern Present (05/12/2023)  Tobacco Use: Medium Risk (05/17/2024)  Health Literacy: Inadequate Health Literacy (05/17/2024)   See flowsheets for full screening details  Depression Screen PHQ 2 & 9 Depression Scale- Over the past 2 weeks, how  often have you been bothered by any of the following problems? Little interest or pleasure in doing things: 0 Feeling down, depressed, or hopeless (PHQ Adolescent also includes...irritable): 0 PHQ-2 Total Score: 0     Goals Addressed   None          Objective:    Today's Vitals   05/17/24 1313  BP: 126/70  Pulse: 65  Temp: (!) 97 F (36.1 C)  SpO2: 98%  Weight: 208 lb 3.2 oz (94.4 kg)  Height: 5' 10 (1.778 m)   Body mass index is 29.87 kg/m.  Hearing/Vision screen Hearing Screening - Comments:: Patient can not remember last exam. Due for exam. Vision Screening - Comments:: Last exam 5 years due for exam.  Immunizations and Health Maintenance Health Maintenance  Topic Date Due   Pneumococcal Vaccine: 50+ Years (2 of 2 - PPSV23, PCV20, or PCV21) 05/17/2025 (Originally 01/03/2014)   COVID-19 Vaccine (10 - 2025-26 season) 08/31/2024   Medicare Annual Wellness (AWV)  05/17/2025   DTaP/Tdap/Td (3 - Td or Tdap) 10/30/2029   Influenza Vaccine  Completed   Meningococcal B Vaccine  Aged Out   Zoster Vaccines- Shingrix  Discontinued        Assessment/Plan:  This is a routine wellness examination for Alex [name].  Patient Care Team: Gil Greig BRAVO, NP as PCP - General (Adult Health Nurse Practitioner) Anner Alm ORN, MD as PCP - Cardiology (Cardiology) Court Dorn PARAS, MD as Consulting Physician (Cardiology) Serene Gaile ORN, MD as Consulting Physician (Vascular Surgery) Rosan Credit, MD as Consulting Physician (Ophthalmology) Gaston Hamilton, MD as Consulting Physician (Urology) Rosan Credit, MD as Consulting Physician (Ophthalmology)  I have personally reviewed and noted the following in the patients chart:   Medical and social history Use of alcohol, tobacco or illicit drugs  Current medications and supplements including opioid prescriptions. Functional ability and status Nutritional status Physical activity Advanced directives List of other  physicians Hospitalizations, surgeries, and ER visits in previous 12 months Vitals  Screenings to include cognitive, depression, and falls Referrals and appointments  No orders of the defined types were placed in this encounter.  In addition, I have reviewed and discussed with patient certain preventive protocols, quality metrics, and best practice recommendations. A written personalized care plan for preventive services as well as general preventive health recommendations were provided to patient.   Greig FORBES Cluster, NP   05/17/2024   Return in 1 year (on 05/17/2025).  After Visit Summary: (In Person-Printed) AVS printed and given to the patient  Nurse Notes: recommend Prevnar 20 at local pharmacy.   "

## 2024-05-17 NOTE — Patient Instructions (Addendum)
 Recommend Prevnar 20 at local pharmacy> Alex [name] is due for it too!  Alex [name],  Thank you for taking the time for your Medicare Wellness Visit. I appreciate your continued commitment to your health goals. Please review the care plan we discussed, and feel free to reach out if I can assist you further.  Please note that Annual Wellness Visits do not include a physical exam. Some assessments may be limited, especially if the visit was conducted virtually. If needed, we may recommend an in-person follow-up with your provider.  Ongoing Care Seeing your primary care provider every 3 to 6 months helps us  monitor your health and provide consistent, personalized care.  Referrals If a referral was made during today's visit and you haven't received any updates within two weeks, please contact the referred provider directly to check on the status.  Recommended Screenings:  Health Maintenance  Topic Date Due   Pneumococcal Vaccine for age over 63 (2 of 2 - PPSV23, PCV20, or PCV21) 05/17/2025*   COVID-19 Vaccine (10 - 2025-26 season) 08/31/2024   Medicare Annual Wellness Visit  05/17/2025   DTaP/Tdap/Td vaccine (3 - Td or Tdap) 10/30/2029   Flu Shot  Completed   Meningitis B Vaccine  Aged Out   Zoster (Shingles) Vaccine  Discontinued  *Topic was postponed. The date shown is not the original due date.       05/17/2024    1:05 PM  Advanced Directives  Does Patient Have a Medical Advance Directive? Unable to assess, patient is non-responsive or altered mental status    Vision: Annual vision screenings are recommended for early detection of glaucoma, cataracts, and diabetic retinopathy. These exams can also reveal signs of chronic conditions such as diabetes and high blood pressure.  Dental: Annual dental screenings help detect early signs of oral cancer, gum disease, and other conditions linked to overall health, including heart disease and diabetes.  Please see the attached documents for  additional preventive care recommendations.

## 2024-05-25 ENCOUNTER — Other Ambulatory Visit: Payer: Self-pay | Admitting: Orthopedic Surgery

## 2024-05-25 DIAGNOSIS — G4700 Insomnia, unspecified: Secondary | ICD-10-CM

## 2024-05-28 NOTE — Telephone Encounter (Signed)
 Patient is requesting a refill of the following medications: Requested Prescriptions   Pending Prescriptions Disp Refills   diazepam  (VALIUM ) 5 MG tablet [Pharmacy Med Name: diazePAM  5 MG TABLET] 15 tablet     Sig: TAKE A HALF TABLET BY MOUTH EVERY NIGHT AT BEDTIME AS NEEDED FOR ANXIETY    Date of last refill: October 2025   Refill amount: 15  Treatment agreement date: No treatment agreement on file, notation made on pending appointment for April 2026

## 2024-07-09 ENCOUNTER — Other Ambulatory Visit

## 2024-08-23 ENCOUNTER — Ambulatory Visit: Payer: Self-pay | Admitting: Orthopedic Surgery

## 2025-05-20 ENCOUNTER — Ambulatory Visit: Admitting: Orthopedic Surgery
